# Patient Record
Sex: Male | Born: 1935 | Race: White | Hispanic: No | Marital: Married | Smoking: Former smoker
Health system: Southern US, Community
[De-identification: ages and names within clinical notes are randomized; demographics above are authoritative.]

## PROBLEM LIST (undated history)

## (undated) DIAGNOSIS — E079 Disorder of thyroid, unspecified: Secondary | ICD-10-CM

## (undated) DIAGNOSIS — M519 Unspecified thoracic, thoracolumbar and lumbosacral intervertebral disc disorder: Secondary | ICD-10-CM

## (undated) DIAGNOSIS — E291 Testicular hypofunction: Secondary | ICD-10-CM

## (undated) DIAGNOSIS — Z806 Family history of leukemia: Secondary | ICD-10-CM

## (undated) DIAGNOSIS — Z87442 Personal history of urinary calculi: Secondary | ICD-10-CM

## (undated) DIAGNOSIS — I480 Paroxysmal atrial fibrillation: Secondary | ICD-10-CM

## (undated) DIAGNOSIS — I639 Cerebral infarction, unspecified: Secondary | ICD-10-CM

## (undated) DIAGNOSIS — G4730 Sleep apnea, unspecified: Secondary | ICD-10-CM

## (undated) DIAGNOSIS — I4891 Unspecified atrial fibrillation: Secondary | ICD-10-CM

## (undated) DIAGNOSIS — Z86018 Personal history of other benign neoplasm: Secondary | ICD-10-CM

## (undated) DIAGNOSIS — D509 Iron deficiency anemia, unspecified: Secondary | ICD-10-CM

## (undated) DIAGNOSIS — R001 Bradycardia, unspecified: Secondary | ICD-10-CM

## (undated) DIAGNOSIS — E785 Hyperlipidemia, unspecified: Secondary | ICD-10-CM

## (undated) DIAGNOSIS — K5909 Other constipation: Secondary | ICD-10-CM

## (undated) DIAGNOSIS — E039 Hypothyroidism, unspecified: Secondary | ICD-10-CM

## (undated) DIAGNOSIS — Z801 Family history of malignant neoplasm of trachea, bronchus and lung: Secondary | ICD-10-CM

## (undated) DIAGNOSIS — M5030 Other cervical disc degeneration, unspecified cervical region: Secondary | ICD-10-CM

## (undated) DIAGNOSIS — R972 Elevated prostate specific antigen [PSA]: Secondary | ICD-10-CM

## (undated) DIAGNOSIS — I10 Essential (primary) hypertension: Secondary | ICD-10-CM

## (undated) DIAGNOSIS — K5790 Diverticulosis of intestine, part unspecified, without perforation or abscess without bleeding: Secondary | ICD-10-CM

## (undated) DIAGNOSIS — E230 Hypopituitarism: Secondary | ICD-10-CM

## (undated) DIAGNOSIS — I700 Atherosclerosis of aorta: Secondary | ICD-10-CM

## (undated) DIAGNOSIS — N200 Calculus of kidney: Secondary | ICD-10-CM

## (undated) DIAGNOSIS — E119 Type 2 diabetes mellitus without complications: Secondary | ICD-10-CM

## (undated) DIAGNOSIS — C61 Malignant neoplasm of prostate: Secondary | ICD-10-CM

## (undated) HISTORY — DX: Sleep apnea, unspecified: G47.30

## (undated) HISTORY — DX: Elevated prostate specific antigen (PSA): R97.20

## (undated) HISTORY — DX: Family history of malignant neoplasm of trachea, bronchus and lung: Z80.1

## (undated) HISTORY — DX: Essential (primary) hypertension: I10

## (undated) HISTORY — PX: HERNIA REPAIR: SHX51

## (undated) HISTORY — DX: Family history of leukemia: Z80.6

## (undated) HISTORY — PX: TONSILLECTOMY: SUR1361

## (undated) HISTORY — DX: Paroxysmal atrial fibrillation: I48.0

## (undated) HISTORY — DX: Hyperlipidemia, unspecified: E78.5

## (undated) HISTORY — DX: Cerebral infarction, unspecified: I63.9

## (undated) HISTORY — DX: Disorder of thyroid, unspecified: E07.9

## (undated) HISTORY — PX: COLONOSCOPY: SHX174

## (undated) HISTORY — DX: Other constipation: K59.09

## (undated) HISTORY — DX: Bradycardia, unspecified: R00.1

## (undated) HISTORY — PX: JOINT REPLACEMENT: SHX530

## (undated) HISTORY — PX: SPINE SURGERY: SHX786

---

## 1992-12-11 HISTORY — PX: OTHER SURGICAL HISTORY: SHX169

## 1995-12-12 HISTORY — PX: BACK SURGERY: SHX140

## 1996-12-11 HISTORY — PX: BACK SURGERY: SHX140

## 2005-12-11 HISTORY — PX: KNEE SURGERY: SHX244

## 2006-12-11 HISTORY — PX: NECK SURGERY: SHX720

## 2007-12-12 HISTORY — PX: BACK SURGERY: SHX140

## 2008-12-11 HISTORY — PX: KIDNEY STONE SURGERY: SHX686

## 2010-11-24 DIAGNOSIS — J309 Allergic rhinitis, unspecified: Secondary | ICD-10-CM | POA: Insufficient documentation

## 2010-11-24 DIAGNOSIS — E291 Testicular hypofunction: Secondary | ICD-10-CM | POA: Insufficient documentation

## 2010-11-24 DIAGNOSIS — E1169 Type 2 diabetes mellitus with other specified complication: Secondary | ICD-10-CM | POA: Insufficient documentation

## 2010-11-24 DIAGNOSIS — N200 Calculus of kidney: Secondary | ICD-10-CM | POA: Insufficient documentation

## 2010-11-24 DIAGNOSIS — E039 Hypothyroidism, unspecified: Secondary | ICD-10-CM | POA: Insufficient documentation

## 2010-11-24 DIAGNOSIS — R972 Elevated prostate specific antigen [PSA]: Secondary | ICD-10-CM | POA: Insufficient documentation

## 2010-11-24 DIAGNOSIS — M159 Polyosteoarthritis, unspecified: Secondary | ICD-10-CM | POA: Insufficient documentation

## 2010-11-24 DIAGNOSIS — H9190 Unspecified hearing loss, unspecified ear: Secondary | ICD-10-CM | POA: Insufficient documentation

## 2010-11-24 DIAGNOSIS — L918 Other hypertrophic disorders of the skin: Secondary | ICD-10-CM | POA: Insufficient documentation

## 2012-12-11 DIAGNOSIS — C4492 Squamous cell carcinoma of skin, unspecified: Secondary | ICD-10-CM

## 2012-12-11 HISTORY — DX: Squamous cell carcinoma of skin, unspecified: C44.92

## 2012-12-11 HISTORY — PX: MOHS SURGERY: SUR867

## 2012-12-23 DIAGNOSIS — M5136 Other intervertebral disc degeneration, lumbar region: Secondary | ICD-10-CM | POA: Insufficient documentation

## 2012-12-23 DIAGNOSIS — M51369 Other intervertebral disc degeneration, lumbar region without mention of lumbar back pain or lower extremity pain: Secondary | ICD-10-CM | POA: Insufficient documentation

## 2013-04-07 DIAGNOSIS — N400 Enlarged prostate without lower urinary tract symptoms: Secondary | ICD-10-CM | POA: Insufficient documentation

## 2014-12-11 HISTORY — PX: SPINE SURGERY: SHX786

## 2015-01-28 DIAGNOSIS — M48061 Spinal stenosis, lumbar region without neurogenic claudication: Secondary | ICD-10-CM | POA: Insufficient documentation

## 2016-04-03 DIAGNOSIS — E669 Obesity, unspecified: Secondary | ICD-10-CM | POA: Insufficient documentation

## 2016-04-03 DIAGNOSIS — H9313 Tinnitus, bilateral: Secondary | ICD-10-CM | POA: Insufficient documentation

## 2016-10-16 DIAGNOSIS — G589 Mononeuropathy, unspecified: Secondary | ICD-10-CM | POA: Insufficient documentation

## 2016-11-06 DIAGNOSIS — Z0000 Encounter for general adult medical examination without abnormal findings: Secondary | ICD-10-CM | POA: Insufficient documentation

## 2017-05-09 DIAGNOSIS — R0683 Snoring: Secondary | ICD-10-CM | POA: Insufficient documentation

## 2017-10-05 DIAGNOSIS — E2740 Unspecified adrenocortical insufficiency: Secondary | ICD-10-CM | POA: Insufficient documentation

## 2017-11-08 DIAGNOSIS — E119 Type 2 diabetes mellitus without complications: Secondary | ICD-10-CM | POA: Insufficient documentation

## 2018-01-16 DIAGNOSIS — I480 Paroxysmal atrial fibrillation: Secondary | ICD-10-CM | POA: Insufficient documentation

## 2018-05-15 DIAGNOSIS — G4733 Obstructive sleep apnea (adult) (pediatric): Secondary | ICD-10-CM | POA: Insufficient documentation

## 2018-10-04 DIAGNOSIS — K5909 Other constipation: Secondary | ICD-10-CM | POA: Insufficient documentation

## 2018-10-04 DIAGNOSIS — K5730 Diverticulosis of large intestine without perforation or abscess without bleeding: Secondary | ICD-10-CM | POA: Insufficient documentation

## 2018-10-04 DIAGNOSIS — K635 Polyp of colon: Secondary | ICD-10-CM | POA: Insufficient documentation

## 2018-10-11 DIAGNOSIS — R319 Hematuria, unspecified: Secondary | ICD-10-CM | POA: Insufficient documentation

## 2018-10-11 DIAGNOSIS — R001 Bradycardia, unspecified: Secondary | ICD-10-CM | POA: Insufficient documentation

## 2018-10-11 DIAGNOSIS — E079 Disorder of thyroid, unspecified: Secondary | ICD-10-CM | POA: Insufficient documentation

## 2018-10-11 DIAGNOSIS — Z789 Other specified health status: Secondary | ICD-10-CM | POA: Insufficient documentation

## 2019-01-15 DIAGNOSIS — Z86018 Personal history of other benign neoplasm: Secondary | ICD-10-CM | POA: Insufficient documentation

## 2019-01-15 DIAGNOSIS — E230 Hypopituitarism: Secondary | ICD-10-CM | POA: Insufficient documentation

## 2019-01-15 DIAGNOSIS — I10 Essential (primary) hypertension: Secondary | ICD-10-CM | POA: Insufficient documentation

## 2019-04-12 DIAGNOSIS — I2510 Atherosclerotic heart disease of native coronary artery without angina pectoris: Secondary | ICD-10-CM | POA: Insufficient documentation

## 2019-04-12 DIAGNOSIS — I2584 Coronary atherosclerosis due to calcified coronary lesion: Secondary | ICD-10-CM | POA: Insufficient documentation

## 2019-05-31 DIAGNOSIS — E785 Hyperlipidemia, unspecified: Secondary | ICD-10-CM | POA: Insufficient documentation

## 2019-06-18 DIAGNOSIS — I6381 Other cerebral infarction due to occlusion or stenosis of small artery: Secondary | ICD-10-CM | POA: Insufficient documentation

## 2019-06-22 DIAGNOSIS — Z7901 Long term (current) use of anticoagulants: Secondary | ICD-10-CM | POA: Insufficient documentation

## 2019-07-01 DIAGNOSIS — R471 Dysarthria and anarthria: Secondary | ICD-10-CM | POA: Insufficient documentation

## 2019-07-01 DIAGNOSIS — Z8673 Personal history of transient ischemic attack (TIA), and cerebral infarction without residual deficits: Secondary | ICD-10-CM | POA: Insufficient documentation

## 2019-11-04 DIAGNOSIS — Z0000 Encounter for general adult medical examination without abnormal findings: Secondary | ICD-10-CM | POA: Insufficient documentation

## 2019-11-04 DIAGNOSIS — D369 Benign neoplasm, unspecified site: Secondary | ICD-10-CM | POA: Insufficient documentation

## 2019-11-10 ENCOUNTER — Other Ambulatory Visit: Payer: Self-pay | Admitting: Orthopedic Surgery

## 2019-11-10 DIAGNOSIS — R972 Elevated prostate specific antigen [PSA]: Secondary | ICD-10-CM

## 2019-11-17 ENCOUNTER — Other Ambulatory Visit: Payer: Self-pay

## 2019-11-17 ENCOUNTER — Ambulatory Visit: Payer: Medicare Other | Attending: Neurology | Admitting: Speech Pathology

## 2019-11-17 ENCOUNTER — Encounter: Payer: Self-pay | Admitting: Speech Pathology

## 2019-11-17 DIAGNOSIS — R471 Dysarthria and anarthria: Secondary | ICD-10-CM

## 2019-11-17 NOTE — Therapy (Signed)
[place] MAIN [facility] SERVICES [address] [place], [place], [address] Phone: [phone]   Fax:  [phone]  Speech Language Pathology Evaluation  Patient Details  Name: Andrew Callahan MRN: [id number] Date of Birth: [birth year] No data recorded  Encounter Date: 11/17/2019  End of Session - 11/17/19 1308    Visit Number  1    Number of Visits  17    Date for SLP Re-Evaluation  01/17/19    SLP Start Time  1108    SLP Stop Time   1200    SLP Time Calculation (min)  52 min    Activity Tolerance  Patient tolerated treatment well       No past medical history on file.    There were no vitals filed for this visit.  Subjective Assessment - 11/17/19 1128    Subjective  Pt was very pleasant, a good historian and attempted all evaluation tasks to the best of his ability.    Patient is accompained by:  Family member   wife   Currently in Pain?  No/denies         SLP Evaluation [facility] - 11/17/19 1128      SLP Visit Information   SLP Received On  11/17/19    Onset Date  05/2019      Subjective   Subjective  Pt reported he is doing well today. Wife present and very supportive, brought previous speech tx materials.    Patient/Family Stated Goal  I would like to speak more clearly. Sometimes my wife has trouble understanding me, especially if I try to speak quickly.      General Information   HPI  This very pleasant 85 y/o pt recently moved to [place] from [place], [place] and is referred by Dr. Melrose Nakayama for evaluation of his speech s/p CVA 05/2019. Pt reports he suffered a stroke on 05/31/19 in [place], [place] which resulted in dysarthria and R facial droop. He participated in 2 week inpatient rehab program with PT/OT/ST, and received approximately 7-8 outpatient ST treatments for dysarthria. Pt reports he has to speak slowly to speak with clarity;however "people don't like to wait" and often don't understand me or repeatedly ask me questions without  giving me time to answer. Wife also reports difficulty understanding pt especially in the morning if he tries to speak quickly. Pt is here seeking assistance with the clarity of his speech.   Behavioral/Cognition  WFL    Mobility Status  Difficulty with balance      Balance Screen   Has the patient fallen in the past 6 months  No    Has the patient had a decrease in activity level because of a fear of falling?   Yes    Is the patient reluctant to leave their home because of a fear of falling?   No      Prior Functional Status   Type of Home  House     Lives With  Spouse    Available Support  --   none, recently moved from [place], [place]   Education  PhD    Vocation  Retired   Armed forces operational officer   Overall Cognitive Status  Within [employer] for tasks assessed      Auditory Comprehension   Overall [place] within functional limits for tasks assessed      Verbal Expression   Overall Verbal Expression  Appears within functional limits for tasks assessed  Oral Motor/Sensory Function   Overall Oral Motor/Sensory Function  Impaired    Labial ROM  Within Functional Limits    Labial Symmetry  Abnormal symmetry right    Labial Strength  Reduced Right    Labial Coordination  Reduced    Lingual ROM  Within Functional Limits    Lingual Symmetry  Abnormal symmetry right;Other (Comment)   very mild   Lingual Strength  Within Functional Limits    Lingual Coordination  Reduced    Facial ROM  Within Functional Limits    Facial Symmetry  Right droop    Facial Strength  Reduced    Facial Coordination  WFL    Velum  Impaired right   very mild   Mandible  Within Functional Limits    Overall Oral Motor/Sensory Function  Mild to mod deficits      Motor Speech   Overall Motor Speech  Impaired    Respiration  Impaired    Level of Impairment  Sentence    Phonation  Normal    Resonance  Hyponasality   mild   Articulation  Impaired    Level of  Impairment  Word    Intelligibility  Intelligibility reduced    Word  75-100% accurate    Phrase  75-100% accurate    Sentence  75-100% accurate    Conversation  75-100% accurate    Motor Planning  [place] functional limits    Effective Techniques  Slow rate;Increased vocal intensity;Over-articulate;Pacing    Phonation  WFL       Additional Objective Measures:  Maximum phonation time: average of 13.8 seconds (Mean norm for ages 81-90=22.31 seconds)  Coordination: Diadochokinetic Rates/Sequential Motion Rates:  /puh/: 2.2 per second: Normative values for males ages 74-86=6.7/second /tuh/:1.8 per second: Normative values for males ages 74-86=6.4/second /kuh/:1.2 per second: Normative values for males ages 74-86=5.8/second  /puhtuhkuh/: .8 per second: No normative values for males ages 52-86 available; however normative values for males ages 65-74=6.1/second  S/z ratio: 1.68       SLP Education - 11/17/19 1307    Education Details  [id number] of SLP in dysarthria tx, compensatory strategies to improve intelligibility of speech    Person(s) Educated  Patient;Spouse    Methods  Explanation;Demonstration;Verbal cues    Comprehension  Verbalized understanding;Need further instruction;Returned demonstration;Verbal cues required         SLP Long Term Goals - 11/17/19 1310      SLP LONG TERM GOAL #1   Title  Pt will read multisyllabic words with 90% accuracy given min verbal and visual cues.    Time  8    Period  Weeks    Status  New    Target Date  01/17/19      SLP LONG TERM GOAL #2   Title  Patient will use appropriate articulatory accuracy, speech rate, and breath support when reading paragraphs as well as during a 5 minute conversational sample with 90% accuracy given min verbal and visual cues.    Time  8    Period  Weeks    Status  New    Target Date  01/17/19      SLP LONG TERM GOAL #3   Title  Pt will use compensatory strategies of overarticulation and pacing to  improve the precision and clarity of his speech with 90% accuracy independently.    Time  8    Period  Weeks    Status  New    Target Date  01/17/19  Plan - 11/17/19 1309    Clinical Impression Statement  This very pleasant 85 y/o male presents with mod dysarthria c/b decreased coordination articulating multi syllabic words particularly those containing sibilants, affricates, and blends as well as decreased breath support for speech at the sentence level. Oral motor exam revealed mild to mod decreased facial and labial strength and decreased lingual coordination. Noted 80-90% intelligibility at the conversational level when pt speaks slowly; however intelligibility decreases as pt fatigues. Pt will benefit from skilled ST services to improve articulatory precision & coordination and breath support to improve and restore pt's functional independence.    Speech Therapy Frequency  2x / week    Duration  Other (comment)   8 weeks   Treatment/Interventions  SLP instruction and feedback;Functional tasks;Compensatory strategies;Patient/family education;Cueing hierarchy    Potential to Achieve Goals  Good    Potential Considerations  Ability to learn/carryover information;Family/community support;Previous level of function;Severity of impairments;Cooperation/participation level    SLP Home Exercise Plan  TBD    Consulted and Agree with Plan of Care  Patient;Family member/caregiver    Family Member Consulted  wife       Patient will benefit from skilled therapeutic intervention in order to improve the following deficits and impairments:   Dysarthria and anarthria    Problem List There are no active problems to display for this patient.   [place], MA, CCC-SLP 11/17/2019, 4:35 PM  [place] MAIN [facility] SERVICES [address] [place], [place], [address] Phone: [phone]   Fax:  [phone]  Name: Andrew Callahan MRN: [id number] Date of  Birth: August 23, 1934

## 2019-11-20 ENCOUNTER — Ambulatory Visit
Admission: RE | Admit: 2019-11-20 | Discharge: 2019-11-20 | Disposition: A | Discharge status: Home / Self Care | Payer: Medicare Other | Source: Ambulatory Visit | Attending: Orthopedic Surgery | Admitting: Orthopedic Surgery

## 2019-11-20 ENCOUNTER — Other Ambulatory Visit: Payer: Self-pay

## 2019-11-20 ENCOUNTER — Ambulatory Visit: Payer: Self-pay

## 2019-11-20 DIAGNOSIS — R972 Elevated prostate specific antigen [PSA]: Secondary | ICD-10-CM | POA: Diagnosis present

## 2019-11-20 LAB — POCT I-STAT CREATININE: Creatinine, Ser: 0.8 mg/dL (ref 0.61–1.24)

## 2019-11-20 MED ORDER — GADOBUTROL 1 MMOL/ML IV SOLN
10.0000 mL | Freq: Once | INTRAVENOUS | Status: AC | PRN
  Administered 2019-11-20: 10 mL via INTRAVENOUS

## 2019-11-24 ENCOUNTER — Other Ambulatory Visit: Payer: Self-pay

## 2019-11-24 ENCOUNTER — Encounter: Payer: Self-pay | Admitting: Speech Pathology

## 2019-11-24 ENCOUNTER — Ambulatory Visit: Payer: Medicare Other | Admitting: Speech Pathology

## 2019-11-24 DIAGNOSIS — R471 Dysarthria and anarthria: Secondary | ICD-10-CM

## 2019-11-24 NOTE — Therapy (Signed)
[place] MAIN [facility] SERVICES [address] [place], [place], [address] Phone: [phone]   Fax:  [phone]  Speech Language Pathology Treatment  Patient Details  Name: Andrew Callahan MRN: [id number] Date of Birth: [birth year] No data recorded  Encounter Date: 11/24/2019  End of Session - 11/24/19 1224    Visit Number  2    Number of Visits  17    Date for SLP Re-Evaluation  01/17/19    Authorization - Visit Number  1    Authorization - Number of Visits  10    SLP Start Time  1100    SLP Stop Time   1200    SLP Time Calculation (min)  60 min    Activity Tolerance  Patient tolerated treatment well       Past Medical History:  Diagnosis Date  . Stroke [facility])     Past Surgical History:  Procedure Laterality Date  . SPINE SURGERY      There were no vitals filed for this visit.  Subjective Assessment - 11/24/19 1222    Subjective  Pt was very pleasant and appeared to enjoy treatment attempting all tasks to the best of his ability.    Patient is accompained by:  Family member   wife   Currently in Pain?  No/denies            ADULT SLP TREATMENT - 11/24/19 0001      General Information   Behavior/Cognition  Alert;Cooperative;Pleasant mood    HPI   This very pleasant 85 y/o pt recently moved to [place] from [place], [place] and is referred by Dr. Melrose Nakayama for evaluation of his speech s/p CVA 05/2019. Pt reports he suffered a stroke on 05/31/19 in [place], [place] which resulted in dysarthria and R facial droop. He participated in 2 week inpatient rehab program with PT/OT/ST, and received approximately 7-8 outpatient ST treatments for dysarthria. Pt reports he has to speak slowly to speak with clarity;however "people don't like to wait" and often don't understand me or repeatedly ask me questions without giving me time to answer. Wife also reports difficulty understanding pt especially in the morning if he tries to speak quickly. Pt is  here seeking assistance with the clarity of his speech.      Treatment Provided   Treatment provided  Cognitive-Linquistic      Pain Assessment   Pain Assessment  No/denies pain      Cognitive-Linquistic Treatment   Treatment focused on  Dysarthria;Patient/family/caregiver education    Skilled Treatment   Pt read multisyllabic words with overarticulation and pacing with 90% accuracy given intermittent verbal cues. Pt used appropriate articulatory accuracy, speech rate and breath support when answering wh- questions with 90% accuracy given intermittent verbal cues for overarticulation.        Assessment / Recommendations / Plan   Plan  Continue with current plan of care      Progression Toward Goals   Progression toward goals  Progressing toward goals       SLP Education - 11/24/19 1224    Education Details  re: compensatory strategies to improve intelligibility of speech    Person(s) Educated  Patient    Methods  Explanation;Demonstration;Verbal cues    Comprehension  Verbalized understanding;Need further instruction;Returned demonstration;Verbal cues required         SLP Long Term Goals - 11/17/19 1310      SLP LONG TERM GOAL #1   Title  Pt will read multisyllabic words  with 90% accuracy given min verbal and visual cues.    Time  8    Period  Weeks    Status  New    Target Date  01/17/19      SLP LONG TERM GOAL #2   Title  Patient will use appropriate articulatory accuracy, speech rate, and breath support when reading paragraphs as well as during a 5 minute conversational sample with 90% accuracy given min verbal and visual cues.    Time  8    Period  Weeks    Status  New    Target Date  01/17/19      SLP LONG TERM GOAL #3   Title  Pt will use compensatory strategies of overarticulation and pacing to improve the precision and clarity of his speech with 90% accuracy independently.    Time  8    Period  Weeks    Status  New    Target Date  01/17/19       Plan  - 11/24/19 1225    Clinical Impression Statement  Pt used pacing independently to improve intelligibility of conversational speech. Verbal cues for overarticulation were successful in improving articulatory precision in both isolated treatment targets as well as in conversation. Pt will benefit from continued skilled ST treatment to improve intelligibility of speech and functional independence.   Speech Therapy Frequency  2x / week    Duration  Other (comment)   8 weeks   Treatment/Interventions  SLP instruction and feedback;Functional tasks;Compensatory strategies;Patient/family education;Cueing hierarchy    Potential to Achieve Goals  Good    Potential Considerations  Ability to learn/carryover information;Family/community support;Previous level of function;Severity of impairments;Cooperation/participation level    SLP Home Exercise Plan  use of compensatory strategies at home; overarticulation w/blends, multisyllabic words    Consulted and Agree with Plan of Care  Patient       Patient will benefit from skilled therapeutic intervention in order to improve the following deficits and impairments:   Dysarthria and anarthria    Problem List There are no problems to display for this patient.   [place], MA, CCC-SLP 11/24/2019, 3:45 PM  [place] MAIN [facility] SERVICES [address] [place], [place], [address] Phone: [phone]   Fax:  [phone]   Name: Andrew Callahan MRN: [id number] Date of Birth: November 27, 1934

## 2019-11-27 ENCOUNTER — Ambulatory Visit: Payer: Medicare Other | Admitting: Speech Pathology

## 2019-11-27 ENCOUNTER — Other Ambulatory Visit: Payer: Self-pay

## 2019-11-27 ENCOUNTER — Encounter: Payer: Self-pay | Admitting: Speech Pathology

## 2019-11-27 DIAGNOSIS — R471 Dysarthria and anarthria: Secondary | ICD-10-CM | POA: Diagnosis not present

## 2019-11-27 NOTE — Therapy (Signed)
[place] MAIN [facility] SERVICES [address] [place], [place], [address] Phone: [phone]   Fax:  [phone]  Speech Language Pathology Treatment  Patient Details  Name: Andrew Callahan MRN: [id number] Date of Birth: 04-29-1934 No data recorded  Encounter Date: 11/27/2019  End of Session - 11/27/19 1245    Visit Number  3    Number of Visits  17    Date for SLP Re-Evaluation  01/17/19    Authorization - Visit Number  2    Authorization - Number of Visits  10    SLP Start Time  1100    SLP Stop Time   1202    SLP Time Calculation (min)  62 min    Activity Tolerance  Patient tolerated treatment well       Past Medical History:  Diagnosis Date  . Stroke [facility])     Past Surgical History:  Procedure Laterality Date  . SPINE SURGERY      There were no vitals filed for this visit.  Subjective Assessment - 11/27/19 1113    Subjective  Pt was very pleasant and cooperative, atempting all tx tasks to the best of his ability.    Patient is accompained by:  Family member   wife   Currently in Pain?  No/denies            ADULT SLP TREATMENT - 11/27/19 0001      General Information   Behavior/Cognition  Alert;Cooperative;Pleasant mood    HPI   This very pleasant 85 y/o pt recently moved to [place] from [place], [place] and is referred by Dr. Melrose Nakayama for evaluation of his speech s/p CVA 05/2019. Pt reports he suffered a stroke on 05/31/19 in [place], [place] which resulted in dysarthria and R facial droop. He participated in 2 week inpatient rehab program with PT/OT/ST, and received approximately 7-8 outpatient ST treatments for dysarthria. Pt reports he has to speak slowly to speak with clarity;however "people don't like to wait" and often don't understand me or repeatedly ask me questions without giving me time to answer. Wife also reports difficulty understanding pt especially in the morning if he tries to speak quickly. Pt is here seeking  assistance with the clarity of his speech.      Treatment Provided   Treatment provided  Cognitive-Linquistic      Pain Assessment   Pain Assessment  No/denies pain      Cognitive-Linquistic Treatment   Treatment focused on  Dysarthria;Patient/family/caregiver education    Skilled Treatment   Pt produced multisyllabic words with overarticulation  with 90% accuracy given intermittent verbal cues.   Pt used appropriate articulatory accuracy, speech rate and breath support when answering wh- questions with 90% accuracy given intermittent verbal cues for overarticulation.       Assessment / Recommendations / Plan   Plan  Continue with current plan of care      Progression Toward Goals   Progression toward goals  Progressing toward goals       SLP Education - 11/27/19 1244    Education Details  re: compensatory strategies to improve intelligibility of speech, home practice strategies    Person(s) Educated  Patient;Spouse    Methods  Explanation;Demonstration;Handout    Comprehension  Verbalized understanding         SLP Long Term Goals - 11/27/19 1246      SLP LONG TERM GOAL #1   Title  Pt will read multisyllabic words with 90% accuracy given min  verbal and visual cues.    Time  8    Period  Weeks    Status  Achieved      SLP LONG TERM GOAL #2   Title  Patient will use appropriate articulatory accuracy, speech rate, and breath support when reading paragraphs as well as during a 5 minute conversational sample with 90% accuracy given min verbal and visual cues.    Time  8    Period  Weeks    Status  Achieved      SLP LONG TERM GOAL #3   Title  Pt will use compensatory strategies of overarticulation and pacing to improve the precision and clarity of his speech with 90% accuracy independently.    Time  8    Period  Weeks    Status  Achieved       Plan - 11/27/19 1245    Clinical Impression Statement  Pt demonstrated consistent independence using compensatory  strategies of pacing and overarticulation to improve clarity of speech throughout treatment. Pt demonstrates use of self monitoring and self correction to improve clarity of speech. Spontaneous speech in conversation is 95% intelligible. Provided home practice materials to continue to work on muscular endurance required for articulatory precision with prolonged speaking. Will d/c pt from ST tx today with home maintenance/improvement plan, pt has met all treatment goals   Speech Therapy Frequency  2x / week    Duration  Other (comment)   d/c today   Treatment/Interventions  SLP instruction and feedback;Functional tasks;Compensatory strategies;Patient/family education;Cueing hierarchy    Potential to Achieve Goals  Good    Potential Considerations  Ability to learn/carryover information;Family/community support;Previous level of function;Severity of impairments;Cooperation/participation level    Consulted and Agree with Plan of Care  Patient;Family member/caregiver    Family Member Consulted  wife       Patient will benefit from skilled therapeutic intervention in order to improve the following deficits and impairments:   Dysarthria and anarthria    Problem List There are no problems to display for this patient.   [place], MA, CCC-SLP 11/27/2019, 12:53 PM  [place] MAIN [facility] SERVICES [address] [place], [place], [address] Phone: [phone]   Fax:  [phone]   Name: Andrew Callahan MRN: [id number] Date of Birth: 16-Jan-1934

## 2019-12-01 ENCOUNTER — Ambulatory Visit: Payer: Medicare Other | Admitting: Speech Pathology

## 2019-12-08 ENCOUNTER — Encounter: Payer: Federal, State, Local not specified - PPO | Admitting: Speech Pathology

## 2019-12-09 NOTE — H&P (Signed)
Andrew Callahan, VEST MEDICAL RECORD [id number] ACCOUNT [id number] DATE OF BIRTH:Mar 29, 1934 FACILITY: [facility] LOCATION:  PHYSICIAN:Duran Farrel Conners, MD  HISTORY AND PHYSICAL  DATE OF ADMISSION:  01/01/2020  CHIEF COMPLAINT:  Elevated PSA.  HISTORY OF PRESENT ILLNESS:  The patient is an 85 year old white male found to have an elevated PSA of 8.64 ng/mL.  He also has significant lower urinary tract symptoms.  Evaluation in the office included a prostate MRI scan which indicated a 69.7 mL  prostate with a 3.1 cm PI-RADS category 5 lesion involving the left side of his prostate.  Further evaluation included a Uroflow study you, which indicated a maximum flow rate of 7 mL per second, average flow rate of 4.4 mL per second and a postvoid  residual of 90 mL based upon a voided volume of 133 mL.  Cystoscopy revealed lateral lobe hypertrophy with a 4 cm prostatic urethral length and no evidence of median lobe.  The patient comes in now for UroNav fusion biopsy of the prostate.  ALLERGIES:  Included metoprolol.  CURRENT MEDICATIONS:  Hydrocortisone, Eliquis, Crestor, levothyroxine, valsartan, gabapentin, gabapentin, AndroGel.  PAST SURGICAL HISTORY:  Lumbar laminectomy in 1997 and 1998.  Pituitary adenoma removal in 1994.  Left knee arthroscopy 2007.  A cervical spine surgery 2008, lumbar spine surgery 2009, ureteroscopic ureterolithotomy in 2010.  Upper eyelid repair 2010,  left partial knee replacement in 2011, removal of skin cancer from his hand 2014 and lumbar spine fusion in 2016.  PAST 1.  Atrial fibrillation. 2.  Hypertension. 3.  Hypercholesterolemia. 4.  Panhypopituitarism.  5.  Hypothyroidism. 6.  History of stroke June 2020 which left him with dysarthria and difficulty with handwriting and balance issues. 7.  Sleep apnea.  REVIEW OF SYSTEMS:  The patient has chronic joint discomfort.  He has constipation.  He has decreased auditory acuity and insomnia.  He denies chest pain,  shortness of breath, myocardial infarction or diabetes.  SOCIAL HISTORY:  The patient quit smoking several years ago with a 20-pack-year history.  He consumes 3 alcoholic beverages per week.  PHYSICAL EXAMINATION: VITAL SIGNS:  Height 5 feet 9 inches, weight 234 pounds. GENERAL:  A well-nourished, white male in no acute distress. HEENT:  Sclerae were clear.  Pupils are equally round, reactive to light and accommodation.  Extraocular movements are intact. NECK:  Supple, no palpable cervical adenopathy. PULMONARY:  Lungs clear to auscultation. CARDIOVASCULAR:  Regular rhythm and rate without audible murmurs. ABDOMEN:  Soft, nontender abdomen. GENITOURINARY:  Circumcised.  Testes were smooth, nontender 16 mL size each. RECTAL:  A 40 g, smooth, nontender prostate. NEUROMUSCULAR:  Alert and oriented x3.  IMPRESSION:   1.  Elevated PSA with a category 5 PI-RAD lesion of the left side of the prostate. 2.  Benign prostatic hypertrophy with bladder outlet obstruction.  PLAN:  Image-guided fusion biopsy with the UroNav system.  PN/NUANCE  D:12/09/2019 T:12/09/2019 JOB:[id number]

## 2019-12-11 ENCOUNTER — Encounter: Payer: Federal, State, Local not specified - PPO | Admitting: Speech Pathology

## 2019-12-15 ENCOUNTER — Encounter: Payer: Medicare Other | Admitting: Speech Pathology

## 2019-12-18 ENCOUNTER — Ambulatory Visit: Payer: Federal, State, Local not specified - PPO

## 2019-12-18 ENCOUNTER — Ambulatory Visit: Payer: Federal, State, Local not specified - PPO | Admitting: Speech Pathology

## 2019-12-22 ENCOUNTER — Encounter: Payer: Federal, State, Local not specified - PPO | Admitting: Speech Pathology

## 2019-12-24 ENCOUNTER — Other Ambulatory Visit: Payer: Self-pay

## 2019-12-24 ENCOUNTER — Encounter: Payer: Self-pay | Admitting: Physical Therapy

## 2019-12-24 ENCOUNTER — Ambulatory Visit: Payer: Medicare Other | Attending: Neurology | Admitting: Physical Therapy

## 2019-12-24 DIAGNOSIS — R278 Other lack of coordination: Secondary | ICD-10-CM | POA: Diagnosis present

## 2019-12-24 DIAGNOSIS — M545 Low back pain: Secondary | ICD-10-CM | POA: Insufficient documentation

## 2019-12-24 DIAGNOSIS — G8929 Other chronic pain: Secondary | ICD-10-CM | POA: Insufficient documentation

## 2019-12-24 DIAGNOSIS — M533 Sacrococcygeal disorders, not elsewhere classified: Secondary | ICD-10-CM | POA: Diagnosis present

## 2019-12-24 DIAGNOSIS — R29898 Other symptoms and signs involving the musculoskeletal system: Secondary | ICD-10-CM | POA: Diagnosis present

## 2019-12-24 DIAGNOSIS — M6208 Separation of muscle (nontraumatic), other site: Secondary | ICD-10-CM | POA: Insufficient documentation

## 2019-12-24 NOTE — Patient Instructions (Signed)
toileting mechanics for less straining      Increasing water from 1 glass to 4 glasses per day     Buy portable cycler and squatty potty Or foot stool while waiting for order    When putting on socks and shoes, turn body at 45 deg and prop on chair/ stool   Still use the kneeling technique technique if need

## 2019-12-25 ENCOUNTER — Encounter: Payer: Federal, State, Local not specified - PPO | Admitting: Speech Pathology

## 2019-12-25 NOTE — Therapy (Signed)
[place] MAIN [facility] SERVICES [address] [place], [place], [address] Phone: [phone]   Fax:  [phone]  Physical Therapy Evaluation  Patient Details  Name: Andrew Callahan MRN: [id number] Date of Birth: [birth year] Referring Provider (PT): Tammi Klippel    Encounter Date: 12/24/2019  PT End of Session - 12/25/19 1730    Visit Number  1    Number of Visits  10    Date for PT Re-Evaluation  03/04/20   eval 12/24/19   PT Start Time  1300    PT Stop Time  1430    PT Time Calculation (min)  90 min    Activity Tolerance  Patient tolerated treatment well    Behavior During Therapy  [facility] for tasks assessed/performed       Past Medical History:  Diagnosis Date  . Chronic constipation   . Elevated prostate specific antigen (PSA)   . Hyperlipidemia   . Hypertension   . Hypothyroid   . Paroxysmal A-fib ([place])   . Sinus bradycardia   . Sleep apnea    uses CPAP  . Stroke [facility])     Past Surgical History:  Procedure Laterality Date  . BACK SURGERY  1997   L3, 4, 5 laminectomy   . BACK SURGERY  1998   hard wire removed, fusion of L3-5  . BACK SURGERY  2009   laminotomy/foraminotomy w/ decompression of nerve root, facet thermal ablation L2-3.   [facility] [place]  2010  . KNEE SURGERY Left 2007   Arthroscopic partial knee replacement, replaced medial side   . MOHS SURGERY Right 2014   squamous cell carcinoma  . NECK SURGERY  2008   fusion with hardwire in place C3 through C7   . pituitary adenoma  1994   benign  . SPINE SURGERY    . SPINE SURGERY  2016   fusion L4     There were no vitals filed for this visit.   Subjective Assessment - 12/25/19 1733    Subjective 1) Difficulty with bowel elimination with Hx of constipation:  Somtimes pt had had liquidy paste stools Stool Type 6 ( 10% of the time)  and most of the time the stool is Type 4 but it is hard to sqeeze out and then half of the time, he has gas after the bowel  movement. Constipation started after last back surgery 2016. Pt was taking a lot of pills after the surgery.  Colonoscopy was clear. After the colonoscopy 10/04/2018, pt experienced a normal bowel movements. Frequency of bowel movements 3-4 x day by not consistently at the same time of the time. Daily fluid intake: 1 glass of water, 16 fl oz coffee, no tea, no juices, no alcohol.         2)  Back pain for 20 years, it got really bad with sciatica. Underwent 4 back surgeries and sciatica from L LBP to toe improved. Currently, pt notices B thighs get tired quickly. There is sharp sensations on R midthigh above knee with walking 3/10 intensity.  Tingling sensation that remains when he brushes his hand on it and it disappears after a minute.  1/10 intensity.     3)  Frequent urination/ urge incontinence. Frequency  started more than a year ago. Once every 2, 4 hours. Pt is not able to stop urination when it started. Has leakage before making to the bathroom. Deviated stream to R. Nocturia epsiodes 3-5 x . Uses CPAP for OSA.  Elevated PSA: Pt will be getting a biopsy of prostate next week. Wife said that pt has a bump/ nodule on his prostate.  The previous urologist in [place] did not do anything about it. Current urologist ( Dr. Boneta Lucks) has performed multiple tests.    Pertinent History  Pt and wife moved from [place]. Pt does not have a pulmonologist currently here and would like a referral.   Pt getting a medical work up for a nodule on his prostate next week. Hx of Underwent 4 back surgeries , pituitary adenomonty, A-fib, Acute Ischemic Stroke  p/w dysarthria/ R facial droop ( 05/31/19), MOHS surgery for squamous cell removal on R hand. Enjoyed the gym preCOVID pandemic bike 30 min, dumbbells. no sit up and crunches         [facility] PT Assessment - 12/24/19 1358      Assessment   Medical Diagnosis  constipation. fecal urgency     Referring Provider (PT)  Tammi Klippel       Precautions    Precautions  None      Restrictions   Weight Bearing Restrictions  No      Balance Screen   Has the patient fallen in the past 6 months  No      [place] residence   [place]    Type of Home  House      Prior Function   Level of Independence  Independent      Cognition   Overall Cognitive Status  --   speech, memory, and balance     Sensation   Additional Comments  "sharp/ tingling" along R L3, 5, S1       Coordination   Gross Motor Movements are Fluid and Coordinated  --   chest breathing, limited diaphragmatic excursion      AROM   Overall AROM Comments  hip flexion in supine limited 90 deg on L , ~110 deg on R       Strength   Overall Strength Comments  hip flex/knee flex/ ext 5/5         Palpation   Spinal mobility  L side bend limited > R  . Noted R convex lumbar curve   SI assessment   L iliac crest higher. FADDIR on b ( L > R)        Bed Mobility   Bed Mobility  --   half crunch               Objective measurements completed on examination: See above findings.    Pelvic Floor Special Questions - 12/24/19 1438    Diastasis Recti  abdominal bulging below sternum, 3 fingers with below umbilicus         [facility] Adult PT Treatment/Exercise - 12/25/19 1726      Bed Mobility   Bed Mobility  --   half crunch     Therapeutic Activites    Therapeutic Activities  Other Therapeutic Activities    Other Therapeutic Activities  explained anatomy. physiology and Sx       Neuro Re-ed    Neuro Re-ed Details   cued for log rolling, sit to stand to optimize deep core            PT Long Term Goals - 12/25/19 1715      PT LONG TERM GOAL #1   Title  Pt will improve his  FOTO score bowel from 36 to > 50 pts  and urinary from 45 to > 54 pts in order to demo improved function    Time  10    Status  New    Target Date  03/04/20      PT LONG TERM GOAL #2   Title  Pt will report equal sensation with dermatome along  L3, L5, S1 bilaterally in order to demo improved QOL    Baseline  Currently, pt notices B thighs get tired quickly. There is sharp sensations on R midthigh above knee with walking 3/10 intensity. Tingling sensation that remains when he brushes his hand on it and it disappears after a minute. 1/10 intensity.    Time  6    Period  Weeks    Status  New    Target Date  02/05/20      PT LONG TERM GOAL #3   Title  Pt will demo decreased abdominal bulging, more closure of rectus abdominal mm in order to improve intraabdominal pressure system for postural and motility improvements    Time  4    Status  New    Target Date  01/22/20      PT LONG TERM GOAL #4   Title  Pt will demo proper body mechanics in ADLs and technique with fitness routine to minimize straining on pelvic  floor/ abdominal/ spine and minimize worsening of Sx    Time  8    Period  Weeks    Status  New    Target Date  02/19/20      PT LONG TERM GOAL #5   Title  Pt will report increasing intake of water from 1 glass per day to 4 glasses ( 32 fl oz)  in order to improve bowel movements and bladder health    Time  2    Period  Weeks    Status  New    Target Date  01/08/20      Additional Long Term Goals   Additional Long Term Goals  Yes      PT LONG TERM GOAL #6   Title  Pt will demo proper deep core coordination to no longer strain with bowel movements and compliant with toileting mechanics in order to minimize abdominal/ pelvic floor straining    Time  4    Period  Weeks    Status  New    Target Date  01/22/20                 Plan - 12/25/19 1731    Clinical Impression Statement  Pt is a 86 yo male who complains of difficulty w/eliminating bowels, Hx of constipation that started after most recent back surgery ( 4 out of 4)  in 2016. Pt also reports CLBP with sharp sensations along R anteromedial thigh in addition to frequent urination and urge incontinence. These deficits impact him his QOL and keeps him from  leaving his house. Clinical presentation today include signs of poor intraabdominal pressure system impacting his Sx:  _ Limited L SIJ mobility, limited lumbar scar restrictions,  _abdominal bulging/ rectus abdominal separation below sternum _limited spinal L sideflexion with R convex lumbar curve _poor body mechanics that place strain on abdomen/ pelvic floor/ spine _limited hip flexion AROM on L  _dyscoordination of deep core mm _Hx of 4 lumbar surgeries and neck fusion, L partial knee replacement,  Associated factors to his Sx: see complex comorbidities and limited water intake with poor bladder health education. Pt also enjoyed working out at the  gym prior to pandemic with bike 30 min, dumbbells weights.    Plan to help improve intraabdominal pressure system which can help improve his Sx and help pt set up home gym to maintain the MMT 5/5 BLE strength pt demonstrated. Plan to educate pt on proper technique with lifting dumbbells to minimize straining pelvic floor at next session. Following Tx today, pt voiced compliance with increasing water and demo'd correct  toileting technique to minimize straining with bowel movements.   Interdisciplinary team info:  Also plan to stay updated with his urological medical work-up re: nodule on his prostate. Pt has recently moved from [place] and building up an interdisciplinary team. Pt is looking for a pulmonologist. Plan to refer pt names of pulmonologists as pt has not updated sleep study but has been compliant with CPAP machine for OSA.   Pt presented today with wife who states pt has been having speech, memory, and balance problems. [facility] at [facility], independent living community.       Personal Factors and Comorbidities  Comorbidity 3+;Past/Current Experience;Age    Comorbidities  Pt is getting a medical work up for a nodule on his prostate next week. Hx of Underwent 4 back surgeries,  pituitary adenomonty, A-fib, Acute Ischemic Stroke  p/w  dysarthria/ R facial droop ( 05/31/19), MOHS surgery for squamous cell removal on R hand, L partial knee replacement    Examination-Activity Limitations  Continence;Locomotion Level;Transfers;Lift;Toileting;Bed Mobility    Stability/Clinical Decision Making  Evolving/Moderate complexity    Clinical Decision Making  High    Rehab Potential  Good    PT Frequency  1x / week    PT Duration  --   10   PT Treatment/Interventions  Neuromuscular re-education;Manual techniques;Patient/family education;Therapeutic activities;Functional mobility training;Stair training;Moist Heat;Scar mobilization;Taping;Therapeutic exercise    Consulted and Agree with Plan of Care  Patient       Patient will benefit from skilled therapeutic intervention in order to improve the following deficits and impairments:  Decreased activity tolerance, Decreased endurance, Decreased knowledge of precautions, Difficulty walking, Decreased safety awareness, Increased muscle spasms, Improper body mechanics, Postural dysfunction, Decreased scar mobility, Decreased mobility, Decreased coordination, Hypomobility, Decreased strength, Decreased range of motion  Visit Diagnosis: Diastasis recti - Plan: PT plan of care cert/re-cert  Other lack of coordination - Plan: PT plan of care cert/re-cert  Other symptoms and signs involving the musculoskeletal system - Plan: PT plan of care cert/re-cert  Chronic bilateral low back pain without sciatica - Plan: PT plan of care cert/re-cert  Sacrococcygeal disorders, not elsewhere classified - Plan: PT plan of care cert/re-cert     Problem List There are no problems to display for this patient.   Jerl Mina ,PT, DPT, E-RYT  12/25/2019, 6:06 PM  [place] MAIN [facility] SERVICES [address] [place], [place], [address] Phone: [phone]   Fax:  [phone]  Name: Andrew Callahan MRN: [id number] Date of Birth: 1934-04-03

## 2019-12-29 ENCOUNTER — Encounter: Payer: Federal, State, Local not specified - PPO | Admitting: Speech Pathology

## 2019-12-29 ENCOUNTER — Encounter
Admission: RE | Admit: 2019-12-29 | Discharge: 2019-12-29 | Disposition: A | Discharge status: Home / Self Care | Payer: Medicare Other | Source: Ambulatory Visit | Attending: Urology | Admitting: Urology

## 2019-12-29 ENCOUNTER — Other Ambulatory Visit: Payer: Self-pay

## 2019-12-29 HISTORY — DX: Hypothyroidism, unspecified: E03.9

## 2019-12-29 HISTORY — DX: Personal history of urinary calculi: Z87.442

## 2019-12-29 NOTE — Patient Instructions (Signed)
Your procedure is scheduled on: Thurs 1/21 Report to [facility]. To find out your arrival time please call [phone] between 1PM - 3PM on Wed. 1/20.  Remember: Instructions that are not followed completely may result in serious medical risk,  up to and including death, or upon the discretion of your surgeon and anesthesiologist your  surgery may need to be rescheduled.     _X__ 1. Do not eat food after midnight the night before your procedure.                 No gum chewing or hard candies. You may drink clear liquids up to 2 hours                 before you are scheduled to arrive for your surgery- DO not drink clear                 liquids within 2 hours of the start of your surgery.                 Clear Liquids include:  water, apple juice without pulp, clear carbohydrate                 drink such as Clearfast of Gatorade, Black Coffee or Tea (Do not add                 anything to coffee or tea).  __X__2.  On the morning of surgery brush your teeth with toothpaste and water, you                may rinse your mouth with mouthwash if you wish.  Do not swallow any toothpaste of mouthwash.     _X__ 3.  No Alcohol for 24 hours before or after surgery.   ___ 4.  Do Not Smoke or use e-cigarettes For 24 Hours Prior to Your Surgery.                 Do not use any chewable tobacco products for at least 6 hours prior to                 surgery.  ____  5.  Bring all medications with you on the day of surgery if instructed.   __x__  6.  Notify your doctor if there is any change in your medical condition      (cold, fever, infections).     Do not wear jewelry, make-up, hairpins, clips or nail polish. Do not wear lotions, powders, or perfumes. You may wear deodorant. Do not shave 48 hours prior to surgery. Men may shave face and neck. Do not bring valuables to the hospital.    [facility] is not responsible for any belongings or  valuables.  Contacts, dentures or bridgework may not be worn into surgery. Leave your suitcase in the car. After surgery it may be brought to your room. For patients admitted to the hospital, discharge time is determined by your treatment team.   Patients discharged the day of surgery will not be allowed to drive home.   Please read over the following fact sheets that you were given:    __x__ Take these medicines the morning of surgery with A SIP OF WATER:    1. levothyroxine (SYNTHROID) 50 MCG tablet  2. gabapentin (NEURONTIN) 100 MG capsule  3. hydrocortisone (CORTEF) 10 MG tablet  4.rosuvastatin (CRESTOR) 20 MG tablet  5.  6. ( Do Not Take  Valsartan)  _x___ Fleet Enema (as directed)  Morning of surgery before coming to the hospital   _x___ Shower the night before and the morning of the surgery.  ____ Use inhalers on the day of surgery  ____ Stop metformin 2 days prior to surgery    ____ Take 1/2 of usual insulin dose the night before surgery. No insulin the morning          of surgery.   __x__ Stop  ELIQUIS 5 MG TABS tablet  ( Last dose tonight)  ____ Stop Anti-inflammatories    __x__ Stop supplements until after surgery.  B Complex-C (B-COMPLEX WITH VITAMIN C) tablet,Glucosamine-Chondroitin-MSM (GLUCOSAMINE CHONDROIT MSM DS PO  __x__ Use C-Pap day of surgery while resting.

## 2019-12-30 ENCOUNTER — Other Ambulatory Visit
Admission: RE | Admit: 2019-12-30 | Discharge: 2019-12-30 | Disposition: A | Discharge status: Home / Self Care | Payer: Medicare Other | Source: Ambulatory Visit | Attending: Urology | Admitting: Urology

## 2019-12-30 DIAGNOSIS — Z01812 Encounter for preprocedural laboratory examination: Secondary | ICD-10-CM | POA: Diagnosis present

## 2019-12-30 DIAGNOSIS — Z20822 Contact with and (suspected) exposure to covid-19: Secondary | ICD-10-CM | POA: Diagnosis not present

## 2019-12-30 LAB — SARS CORONAVIRUS 2 (TAT 6-24 HRS): SARS Coronavirus 2: NEGATIVE

## 2019-12-31 MED ORDER — LEVOFLOXACIN IN D5W 500 MG/100ML IV SOLN
500.0000 mg | INTRAVENOUS | Status: DC
  Administered 2020-01-01: 500 mg via INTRAVENOUS

## 2019-12-31 MED ORDER — GENTAMICIN SULFATE 40 MG/ML IJ SOLN
80.0000 mg | Freq: Once | INTRAVENOUS | Status: DC
  Filled 2019-12-31: qty 2

## 2020-01-01 ENCOUNTER — Ambulatory Visit
Admission: RE | Admit: 2020-01-01 | Discharge: 2020-01-01 | Disposition: A | Discharge status: Home / Self Care | Payer: Medicare Other | Attending: Urology | Admitting: Urology

## 2020-01-01 ENCOUNTER — Other Ambulatory Visit: Payer: Self-pay

## 2020-01-01 ENCOUNTER — Encounter: Payer: Medicare Other | Admitting: Speech Pathology

## 2020-01-01 ENCOUNTER — Encounter: Payer: Medicare Other | Admitting: Physical Therapy

## 2020-01-01 ENCOUNTER — Encounter: Payer: Self-pay | Admitting: Urology

## 2020-01-01 ENCOUNTER — Encounter
Admission: RE | Disposition: A | Discharge status: Home / Self Care | Payer: Self-pay | Source: Home / Self Care | Attending: Urology

## 2020-01-01 ENCOUNTER — Ambulatory Visit: Payer: Medicare Other | Admitting: Anesthesiology

## 2020-01-01 DIAGNOSIS — I480 Paroxysmal atrial fibrillation: Secondary | ICD-10-CM | POA: Diagnosis not present

## 2020-01-01 DIAGNOSIS — Z85828 Personal history of other malignant neoplasm of skin: Secondary | ICD-10-CM | POA: Insufficient documentation

## 2020-01-01 DIAGNOSIS — E039 Hypothyroidism, unspecified: Secondary | ICD-10-CM | POA: Insufficient documentation

## 2020-01-01 DIAGNOSIS — K5900 Constipation, unspecified: Secondary | ICD-10-CM | POA: Insufficient documentation

## 2020-01-01 DIAGNOSIS — N401 Enlarged prostate with lower urinary tract symptoms: Secondary | ICD-10-CM | POA: Insufficient documentation

## 2020-01-01 DIAGNOSIS — Z96652 Presence of left artificial knee joint: Secondary | ICD-10-CM | POA: Diagnosis not present

## 2020-01-01 DIAGNOSIS — Z888 Allergy status to other drugs, medicaments and biological substances status: Secondary | ICD-10-CM | POA: Insufficient documentation

## 2020-01-01 DIAGNOSIS — I10 Essential (primary) hypertension: Secondary | ICD-10-CM | POA: Insufficient documentation

## 2020-01-01 DIAGNOSIS — Z7901 Long term (current) use of anticoagulants: Secondary | ICD-10-CM | POA: Insufficient documentation

## 2020-01-01 DIAGNOSIS — R001 Bradycardia, unspecified: Secondary | ICD-10-CM | POA: Insufficient documentation

## 2020-01-01 DIAGNOSIS — E7800 Pure hypercholesterolemia, unspecified: Secondary | ICD-10-CM | POA: Diagnosis not present

## 2020-01-01 DIAGNOSIS — C61 Malignant neoplasm of prostate: Secondary | ICD-10-CM | POA: Diagnosis not present

## 2020-01-01 DIAGNOSIS — Z87891 Personal history of nicotine dependence: Secondary | ICD-10-CM | POA: Diagnosis not present

## 2020-01-01 DIAGNOSIS — Z79899 Other long term (current) drug therapy: Secondary | ICD-10-CM | POA: Diagnosis not present

## 2020-01-01 DIAGNOSIS — N138 Other obstructive and reflux uropathy: Secondary | ICD-10-CM | POA: Insufficient documentation

## 2020-01-01 DIAGNOSIS — G4730 Sleep apnea, unspecified: Secondary | ICD-10-CM | POA: Diagnosis not present

## 2020-01-01 DIAGNOSIS — Z8673 Personal history of transient ischemic attack (TIA), and cerebral infarction without residual deficits: Secondary | ICD-10-CM | POA: Insufficient documentation

## 2020-01-01 DIAGNOSIS — E7119 Other disorders of branched-chain amino-acid metabolism: Secondary | ICD-10-CM | POA: Insufficient documentation

## 2020-01-01 HISTORY — PX: PROSTATE BIOPSY: SHX241

## 2020-01-01 SURGERY — BIOPSY, PROSTATE
Anesthesia: General | Site: Prostate

## 2020-01-01 MED ORDER — PROMETHAZINE HCL 25 MG/ML IJ SOLN: 6.2500 mg | INTRAMUSCULAR | Status: DC | PRN

## 2020-01-01 MED ORDER — GENTAMICIN IN SALINE 1.6-0.9 MG/ML-% IV SOLN
80.0000 mg | INTRAVENOUS | Status: AC
  Administered 2020-01-01: 80 mg via INTRAVENOUS
  Filled 2020-01-01: qty 50

## 2020-01-01 MED ORDER — FENTANYL CITRATE (PF) 100 MCG/2ML IJ SOLN: 25.0000 ug | INTRAMUSCULAR | Status: DC | PRN

## 2020-01-01 MED ORDER — GENTAMICIN SULFATE 40 MG/ML IJ SOLN
80.0000 mg | INTRAVENOUS | Status: DC
  Filled 2020-01-01: qty 2

## 2020-01-01 MED ORDER — PROPOFOL 10 MG/ML IV BOLUS
INTRAVENOUS | Status: AC
  Filled 2020-01-01: qty 40

## 2020-01-01 MED ORDER — FENTANYL CITRATE (PF) 100 MCG/2ML IJ SOLN
INTRAMUSCULAR | Status: AC
  Filled 2020-01-01: qty 2

## 2020-01-01 MED ORDER — LACTATED RINGERS IV SOLN: INTRAVENOUS | Status: DC

## 2020-01-01 MED ORDER — ACETAMINOPHEN 160 MG/5ML PO SOLN
325.0000 mg | ORAL | Status: DC | PRN
  Filled 2020-01-01: qty 20.3

## 2020-01-01 MED ORDER — ACETAMINOPHEN 325 MG PO TABS: 325.0000 mg | ORAL_TABLET | ORAL | Status: DC | PRN

## 2020-01-01 MED ORDER — GLYCOPYRROLATE 0.2 MG/ML IJ SOLN
INTRAMUSCULAR | Status: DC | PRN
  Administered 2020-01-01: .2 mg via INTRAVENOUS

## 2020-01-01 MED ORDER — GLYCOPYRROLATE 0.2 MG/ML IJ SOLN: INTRAMUSCULAR | Status: DC | PRN

## 2020-01-01 MED ORDER — SEVOFLURANE IN SOLN
RESPIRATORY_TRACT | Status: AC
  Filled 2020-01-01: qty 250

## 2020-01-01 MED ORDER — FLEET ENEMA 7-19 GM/118ML RE ENEM: 1.0000 | ENEMA | Freq: Once | RECTAL | Status: DC

## 2020-01-01 MED ORDER — PROPOFOL 500 MG/50ML IV EMUL
INTRAVENOUS | Status: DC | PRN
  Administered 2020-01-01: 125 ug/kg/min via INTRAVENOUS

## 2020-01-01 MED ORDER — LEVOFLOXACIN 500 MG PO TABS: 500.0000 mg | ORAL_TABLET | Freq: Every day | ORAL | 0 refills | Status: DC

## 2020-01-01 MED ORDER — FAMOTIDINE 20 MG PO TABS: 20.0000 mg | ORAL_TABLET | Freq: Once | ORAL | Status: AC

## 2020-01-01 MED ORDER — HYDROCODONE-ACETAMINOPHEN 7.5-325 MG PO TABS
1.0000 | ORAL_TABLET | Freq: Once | ORAL | Status: DC | PRN
  Filled 2020-01-01: qty 1

## 2020-01-01 MED ORDER — IRBESARTAN 150 MG PO TABS
300.0000 mg | ORAL_TABLET | Freq: Every day | ORAL | Status: DC
  Administered 2020-01-01: 10:00:00 300 mg via ORAL
  Filled 2020-01-01: qty 2

## 2020-01-01 MED ORDER — FAMOTIDINE 20 MG PO TABS
ORAL_TABLET | ORAL | Status: AC
  Administered 2020-01-01: 20 mg via ORAL
  Filled 2020-01-01: qty 1

## 2020-01-01 MED ORDER — LEVOFLOXACIN IN D5W 500 MG/100ML IV SOLN
INTRAVENOUS | Status: AC
  Filled 2020-01-01: qty 100

## 2020-01-01 MED ORDER — LIDOCAINE HCL (PF) 2 % IJ SOLN
INTRAMUSCULAR | Status: AC
  Filled 2020-01-01: qty 10

## 2020-01-01 SURGICAL SUPPLY — 10 items
COVER MAYO STAND REUSABLE (DRAPES) ×2 IMPLANT
COVER WAND RF STERILE (DRAPES) ×2 IMPLANT
GLOVE BIO SURGEON STRL SZ7 (GLOVE) ×4 IMPLANT
GUIDE NDL ENDOCAV 16-18 CVR (NEEDLE) IMPLANT
GUIDE NEEDLE ENDOCAV 16-18 CVR (NEEDLE) ×2 IMPLANT
INST BIOPSY MAXCORE 18GX25 (NEEDLE) ×2 IMPLANT
NDL GUIDE BIOPSY 644068 (NEEDLE) IMPLANT
NEEDLE GUIDE BIOPSY 644068 (NEEDLE) IMPLANT
SURGILUBE 2OZ TUBE FLIPTOP (MISCELLANEOUS) ×2 IMPLANT
TOWEL OR 17X26 4PK STRL BLUE (TOWEL DISPOSABLE) ×2 IMPLANT

## 2020-01-01 NOTE — H&P (Signed)
Date of Initial H&P: 12/09/19  History reviewed, patient examined, no change in status, stable for surgery.

## 2020-01-01 NOTE — Op Note (Signed)
Preoperative diagnosis: Elevated PSA  Postoperative diagnosis: Same  Procedure: Image guided [place] fusion transrectal ultrasound guided biopsy of the prostate  Surgeon: Otelia Limes. Yves Dill MD  Anesthesia: General  Indications:See the history and physical. After informed consent the above procedure(s) were requested     Technique and findings: After adequate general anesthesia had been obtained the patient was placed into left lateral decubitus position.  Finger sweep indicated that the rectal vault was clear.  The transrectal ultrasound probe was then placed and ultrasound images were acquired.  Ultrasound images were then fused with the MRI images.  The region of interest was identified and 5 core biopsies taken from this region.  This point standard systematic 12 core biopsies were taken.  The prostate ultrasound probe was then removed and procedure terminated.  Blood loss was minimal.  The procedure was terminated and the patient was transferred to the recovery room in stable condition.

## 2020-01-01 NOTE — Progress Notes (Signed)
Pt had to be in and out cath for 550 urine output. Dr. Yves Dill stated he could go home without urinating. He stated that he would call the patient later that after noon to make sure he was urinating okay.

## 2020-01-01 NOTE — Transfer of Care (Signed)
Immediate Anesthesia Transfer of Care Note  Patient: Andrew Callahan  Procedure(s) Performed: PROSTATE BIOPSY URO NAV FUSION (N/A Prostate)  Patient Location: PACU  Anesthesia Type:General  Level of Consciousness: drowsy  Airway & Oxygen Therapy: Patient connected to nasal cannula oxygen  Post-op Assessment: Report given to RN and Post -op Vital signs reviewed and stable  Post vital signs: Reviewed and stable  Last Vitals:  Vitals Value Taken Time  BP 107/77 01/01/20 0813  Temp    Pulse 54 01/01/20 0816  Resp 10 01/01/20 0816  SpO2 99 % 01/01/20 0816  Vitals shown include unvalidated device data.  Last Pain:  Vitals:   01/01/20 [id number]  TempSrc: Tympanic  PainSc: 0-No pain         Complications: No apparent anesthesia complications

## 2020-01-01 NOTE — Anesthesia Preprocedure Evaluation (Addendum)
Anesthesia Evaluation  Patient identified by MRN, date of birth, ID band Patient awake    Reviewed: Allergy & Precautions, H&P , NPO status , reviewed documented beta blocker date and time   Airway Mallampati: II  TM Distance: >3 FB Neck ROM: limited    Dental  (+) Caps, Chipped   Pulmonary sleep apnea , former smoker,    Pulmonary exam normal        Cardiovascular hypertension, Atrial Fibrillation  Rhythm:irregular     Neuro/Psych CVA, Residual Symptoms    GI/Hepatic neg GERD  ,  Endo/Other  Hypothyroidism   Renal/GU      Musculoskeletal   Abdominal   Peds  Hematology   Anesthesia Other Findings Past Medical History:  1.  Atrial fibrillation. 2.  Hypertension. 3.  Hypercholesterolemia. 4.  Panhypopituitarism.  5.  Hypothyroidism. 6.  History of stroke June 2020 which left him with dysarthria and difficulty with handwriting and balance issues. 7.  Sleep apnea. No date: Chronic constipation No date: Elevated prostate specific antigen (PSA) No date: History of kidney stones No date: Hyperlipidemia No date: Hypertension No date: Hypothyroid No date: Hypothyroidism No date: Paroxysmal A-fib ([facility]) No date: Sinus bradycardia No date: Sleep apnea     Comment:  uses CPAP No date: Stroke [facility]) Past Surgical History: 1997: BACK SURGERY     Comment:  L3, 4, 5 laminectomy  1998: BACK SURGERY     Comment:  hard wire removed, fusion of L3-5 2009: BACK SURGERY     Comment:  laminotomy/foraminotomy w/ decompression of nerve root,               facet thermal ablation L2-3.  2010: [place] 2007: KNEE SURGERY; Left     Comment:  Arthroscopic partial knee replacement, replaced medial               side  2014: MOHS SURGERY; Right     Comment:  squamous cell carcinoma 2008: NECK SURGERY     Comment:  fusion with hardwire in place C3 through C7  1994: pituitary adenoma     Comment:  benign No date:  SPINE SURGERY 2016: SPINE SURGERY     Comment:  fusion L4    Reproductive/Obstetrics                            Anesthesia Physical Anesthesia Plan  ASA: III  Anesthesia Plan: General   Post-op Pain Management:    Induction: Intravenous  PONV Risk Score and Plan: Ondansetron and Treatment may vary due to age or medical condition  Airway Management Planned: LMA  Additional Equipment:   Intra-op Plan:   Post-operative Plan: Extubation in OR  Informed Consent: I have reviewed the patients History and Physical, chart, labs and discussed the procedure including the risks, benefits and alternatives for the proposed anesthesia with the patient or authorized representative who has indicated his/her understanding and acceptance.     Dental Advisory Given  Plan Discussed with: CRNA  Anesthesia Plan Comments:         Anesthesia Quick Evaluation

## 2020-01-01 NOTE — Anesthesia Postprocedure Evaluation (Signed)
Anesthesia Post Note  Patient: Andrew Callahan  Procedure(s) Performed: PROSTATE BIOPSY URO NAV FUSION (N/A Prostate)  Patient location during evaluation: PACU Anesthesia Type: General Level of consciousness: awake and alert Pain management: pain level controlled Vital Signs Assessment: post-procedure vital signs reviewed and stable Respiratory status: spontaneous breathing, nonlabored ventilation, respiratory function stable and patient connected to nasal cannula oxygen Cardiovascular status: blood pressure returned to baseline and stable Postop Assessment: no apparent nausea or vomiting Anesthetic complications: no Comments: BP stable post urinary cath     Last Vitals:  Vitals:   01/01/20 1023 01/01/20 1055  BP: (!) 178/89 (!) 186/88  Pulse: (!) 50 (!) 48  Resp: 16 16  Temp: (!) 36.2 C   SpO2: 98% 99%    Last Pain:  Vitals:   01/01/20 1055  TempSrc:   PainSc: 0-No pain                 Alphonsus Sias

## 2020-01-01 NOTE — Discharge Instructions (Addendum)
Transrectal Ultrasound-Guided Prostate Biopsy, Care After This sheet gives you information about how to care for yourself after your procedure. Your doctor may also give you more specific instructions. If you have problems or questions, contact your doctor. What can I expect after the procedure? After the procedure, it is common to have:  Pain and discomfort in your butt, especially while sitting.  Pink-colored pee (urine), due to small amounts of blood in the pee.  Burning while peeing (urinating).  Blood in your poop (stool).  Bleeding from your butt.  Blood in your semen. Follow these instructions at home: Medicines  Take over-the-counter and prescription medicines only as told by your doctor.  If you were prescribed antibiotic medicine, take it as told by your doctor. Do not stop taking the antibiotic even if you start to feel better. Activity   Do not drive for 24 hours if you were given a medicine to help you relax (sedative) during your procedure.  Return to your normal activities as told by your doctor. Ask your doctor what activities are safe for you.  Ask your doctor when it is okay for you to have sex.  Do not lift anything that is heavier than 10 lb (4.5 kg), or the limit that you are told, until your doctor says that it is safe. General instructions   Drink enough water to keep your pee pale yellow.  Watch your pee, poop, and semen for new bleeding or bleeding that gets worse.  Keep all follow-up visits as told by your doctor. This is important. Contact a doctor if you:  Have blood clots in your pee or poop.  Notice that your pee smells bad or unusual.  Have very bad belly pain.  Have trouble peeing.  Notice that your lower belly feels firm.  Have blood in your pee for more than 2 weeks after the procedure.  Have blood in your semen for more than 2 months after the procedure.  Have problems getting an erection.  Feel sick to your stomach  (nauseous).  Throw up (vomit).  Have new or worse bleeding in your pee, poop, or semen. Get help right away if you:  Have a fever or chills.  Have bright red pee.  Have very bad pain that does not get better with medicine.  Cannot pee. Summary  After this procedure, it is common to have pain and discomfort around your butt, especially while sitting.  You may have blood in your pee and poop.  It is common to have blood in your semen for 1-2 months.  If you were prescribed antibiotic medicine, take it as told by your doctor. Do not stop taking the antibiotic even if you start to feel better.  Get help right away if you have a fever or chills. This information is not intended to replace advice given to you by your health care provider. Make sure you discuss any questions you have with your health care provider. Document Revised: 03/19/2019 Document Reviewed: 09/25/2017 Elsevier Patient Education  2020 [employer]   AMBULATORY SURGERY  DISCHARGE INSTRUCTIONS   1) The drugs that you were given will stay in your system until tomorrow so for the next 24 hours you should not:  A) Drive an automobile B) Make any legal decisions C) Drink any alcoholic beverage   2) You may resume regular meals tomorrow.  Today it is better to start with liquids and gradually work up to solid foods.  You may eat anything you prefer,   but it is better to start with liquids, then soup and crackers, and gradually work up to solid foods.   3) Please notify your doctor immediately if you have any unusual bleeding, trouble breathing, redness and pain at the surgery site, drainage, fever, or pain not relieved by medication.    4) Additional Instructions:        Please contact your physician with any problems or Same Day Surgery at [phone], Monday through Friday 6 am to 4 pm, or [facility] [place] Main number at [phone]. 

## 2020-01-02 LAB — SURGICAL PATHOLOGY

## 2020-01-06 ENCOUNTER — Encounter: Payer: Medicare Other | Admitting: Physical Therapy

## 2020-01-08 ENCOUNTER — Other Ambulatory Visit: Payer: Self-pay

## 2020-01-08 ENCOUNTER — Ambulatory Visit: Payer: Medicare Other | Admitting: Physical Therapy

## 2020-01-08 DIAGNOSIS — R278 Other lack of coordination: Secondary | ICD-10-CM

## 2020-01-08 DIAGNOSIS — R29898 Other symptoms and signs involving the musculoskeletal system: Secondary | ICD-10-CM

## 2020-01-08 DIAGNOSIS — M6208 Separation of muscle (nontraumatic), other site: Secondary | ICD-10-CM | POA: Diagnosis not present

## 2020-01-08 DIAGNOSIS — M533 Sacrococcygeal disorders, not elsewhere classified: Secondary | ICD-10-CM

## 2020-01-08 DIAGNOSIS — G8929 Other chronic pain: Secondary | ICD-10-CM

## 2020-01-08 NOTE — Therapy (Signed)
[place] MAIN [facility] SERVICES [address] [place], [place], [address] Phone: [phone]   Fax:  [phone]  Physical Therapy Treatment  Patient Details  Name: Andrew Callahan MRN: [id number] Date of Birth: 26-May-1934 Referring Provider (PT): Tammi Klippel    Encounter Date: 01/08/2020  PT End of Session - 01/08/20 1541    Visit Number  2    PT Start Time  0910    PT Stop Time  1004    PT Time Calculation (min)  54 min       Past Medical History:  Diagnosis Date  . Chronic constipation   . Elevated prostate specific antigen (PSA)   . History of kidney stones   . Hyperlipidemia   . Hypertension   . Hypothyroid   . Hypothyroidism   . Paroxysmal A-fib ([place])   . Sinus bradycardia   . Sleep apnea    uses CPAP  . Stroke [facility])     Past Surgical History:  Procedure Laterality Date  . BACK SURGERY  1997   L3, 4, 5 laminectomy   . BACK SURGERY  1998   hard wire removed, fusion of L3-5  . BACK SURGERY  2009   laminotomy/foraminotomy w/ decompression of nerve root, facet thermal ablation L2-3.   Marland Kitchen [place]  2010  . KNEE SURGERY Left 2007   Arthroscopic partial knee replacement, replaced medial side   . MOHS SURGERY Right 2014   squamous cell carcinoma  . NECK SURGERY  2008   fusion with hardwire in place C3 through C7   . pituitary adenoma  1994   benign  . PROSTATE BIOPSY N/A 01/01/2020   Procedure: PROSTATE BIOPSY URO NAV FUSION;  Surgeon: Royston Cowper, MD;  Location: [facility] ORS;  Service: Urology;  Laterality: N/A;  . SPINE SURGERY    . SPINE SURGERY  2016   fusion L4     There were no vitals filed for this visit.  Subjective Assessment - 01/08/20 0913    Subjective  Pt reports the portable cycler does not work for him and prefers to get a stationery bike.         [facility] PT Assessment - 01/08/20 0919      AROM   Overall AROM Comments  hip flexion in supine limited110 deg on L       Palpation    Spinal mobility  sideflexion B , FADDIR no longer limited    SI assessment   iliac crest equally level    Palpation comment  increased tightenss at paraspinals and medial scapular mm B ( decreased post Tx)                 Pelvic Floor Special Questions - 01/08/20 0923    Diastasis Recti  abdominal bulging 1 below sternum, 1 fingers with below umbilicus          [facility] Adult PT Treatment/Exercise - 01/08/20 1540      Therapeutic Activites    Other Therapeutic Activities  discussed set up for portable cycler       Neuro Re-ed    Neuro Re-ed Details   cued for depression of scapula, new HEP technique       Manual Therapy   Manual therapy comments  paraspinals, medial scapular mm B                   PT Long Term Goals - 12/25/19 1715      PT  LONG TERM GOAL #1   Title  Pt will improve his  FOTO score bowel from 36 to > 50 pts and urinary from 45 to > 54 pts in order to demo improved function    Time  10    Status  New    Target Date  03/04/20      PT LONG TERM GOAL #2   Title  Pt will report equal sensation with dermatome along L3, L5, S1 bilaterally in order to demo improved QOL    Baseline  Currently, pt notices B thighs get tired quickly. There is sharp sensations on R midthigh above knee with walking 3/10 intensity. Tingling sensation that remains when he brushes his hand on it and it disappears after a minute. 1/10 intensity.    Time  6    Period  Weeks    Status  New    Target Date  02/05/20      PT LONG TERM GOAL #3   Title  Pt will demo decreased abdominal bulging, more closure of rectus abdominal mm in order to improve intraabdominal pressure system for postural and motility improvements    Time  4    Status  New    Target Date  01/22/20      PT LONG TERM GOAL #4   Title  Pt will demo proper body mechanics in ADLs and technique with fitness routine to minimize straining on pelvic  floor/ abdominal/ spine and minimize worsening of Sx    Time  8     Period  Weeks    Status  New    Target Date  02/19/20      PT LONG TERM GOAL #5   Title  Pt will report increasing intake of water from 1 glass per day to 4 glasses ( 32 fl oz)  in order to improve bowel movements and bladder health    Time  2    Period  Weeks    Status  New    Target Date  01/08/20      Additional Long Term Goals   Additional Long Term Goals  Yes      PT LONG TERM GOAL #6   Title  Pt will demo proper deep core coordination to no longer strain with bowel movements and compliant with toileting mechanics in order to minimize abdominal/ pelvic floor straining    Time  4    Period  Weeks    Status  New    Target Date  01/22/20            Plan - 01/08/20 1542    Clinical Impression Statement  Pt demo'd more equal alignment of pelvic girdle, restored spinal mobility in sideflexion, decreased abdominal bulging but yet had limited anterior/ lateral diaphragmatic excursion. Applied manual Tx to address this deficit without complaints. Pt demo'd improved deep core coordination post Tx, progressed to deep core level 1 but withheld level 2 due to shakiness in R leg after long period in hooklying position. Added thoracic extension and sideflexion exercises. Pt continues to benefit from skilled PT    Personal Factors and Comorbidities  Comorbidity 3+;Past/Current Experience;Age    Comorbidities  Pt and wife moved from [place]. Pt does not have a pulmonologist currently here and would like a referral.   Pt getting a medical work up for a nodule on his prostate next week. Hx of Underwent 4 back surgeries (L3-5 laminectomy 1997, L3-5 rod removal 1998, laminotomy/ foraminotomy w/ decompression of nerve  root, facet ablation 2009, fusion of L4 region 2016 , pituitary adenomonty, A-fib, L partial knee replacement , neck fusion C3-7, Acute Ischemic Stroke  p/w dysarthria/ R facial droop ( 05/31/19), MOHS surgery for squamous cell removal on R hand. Enjoyed the gym preCOVID pandemic  bike 30 min, dumbbells. no sit up and crunches    Examination-Activity Limitations  Continence;Locomotion Level;Transfers;Lift;Toileting;Bed Mobility    Stability/Clinical Decision Making  Evolving/Moderate complexity    Rehab Potential  Good    PT Frequency  1x / week    PT Duration  --   10   PT Treatment/Interventions  Neuromuscular re-education;Manual techniques;Patient/family education;Therapeutic activities;Functional mobility training;Stair training;Moist Heat;Scar mobilization;Taping;Therapeutic exercise    Consulted and Agree with Plan of Care  Patient       Patient will benefit from skilled therapeutic intervention in order to improve the following deficits and impairments:  Decreased activity tolerance, Decreased endurance, Decreased knowledge of precautions, Difficulty walking, Decreased safety awareness, Increased muscle spasms, Improper body mechanics, Postural dysfunction, Decreased scar mobility, Decreased mobility, Decreased coordination, Hypomobility, Decreased strength, Decreased range of motion  Visit Diagnosis: Diastasis recti  Other lack of coordination  Other symptoms and signs involving the musculoskeletal system  Chronic bilateral low back pain without sciatica  Sacrococcygeal disorders, not elsewhere classified     Problem List There are no problems to display for this patient.   Jerl Mina ,PT, DPT, E-RYT  01/08/2020, 3:43 PM  [place] MAIN [facility] SERVICES [address] [place], [place], [address] Phone: [phone]   Fax:  [phone]  Name: Abigail Litfin MRN: [id number] Date of Birth: 1934/02/22

## 2020-01-08 NOTE — Patient Instructions (Addendum)
    Open book ( handout)   Deep core level 1 ( breathing)     ___  Seated cat cow  Brushing hand over thighs , chest arches  5 reps  To assist with bowel    Sidebends  10 reps    ___  Referral for pulmonologist Dr. Corrin Parker, MD

## 2020-01-14 ENCOUNTER — Other Ambulatory Visit: Payer: Self-pay | Admitting: Urology

## 2020-01-14 DIAGNOSIS — C61 Malignant neoplasm of prostate: Secondary | ICD-10-CM

## 2020-01-15 ENCOUNTER — Other Ambulatory Visit: Payer: Self-pay

## 2020-01-15 ENCOUNTER — Ambulatory Visit: Payer: Medicare Other | Attending: Neurology | Admitting: Physical Therapy

## 2020-01-15 DIAGNOSIS — R2681 Unsteadiness on feet: Secondary | ICD-10-CM | POA: Diagnosis present

## 2020-01-15 DIAGNOSIS — M545 Low back pain, unspecified: Secondary | ICD-10-CM

## 2020-01-15 DIAGNOSIS — G8929 Other chronic pain: Secondary | ICD-10-CM | POA: Diagnosis present

## 2020-01-15 DIAGNOSIS — R42 Dizziness and giddiness: Secondary | ICD-10-CM | POA: Insufficient documentation

## 2020-01-15 DIAGNOSIS — R278 Other lack of coordination: Secondary | ICD-10-CM | POA: Diagnosis present

## 2020-01-15 DIAGNOSIS — M533 Sacrococcygeal disorders, not elsewhere classified: Secondary | ICD-10-CM | POA: Diagnosis present

## 2020-01-15 DIAGNOSIS — R29898 Other symptoms and signs involving the musculoskeletal system: Secondary | ICD-10-CM | POA: Diagnosis present

## 2020-01-15 DIAGNOSIS — M6208 Separation of muscle (nontraumatic), other site: Secondary | ICD-10-CM

## 2020-01-15 NOTE — Patient Instructions (Addendum)
  Increasing water from 1 glass to 4 glasses per day    One room temp water first thing before coffee  2nd glass mid morning   3rd glass lunch  4th glass mid afternoon/ evening    ____

## 2020-01-16 NOTE — Therapy (Addendum)
[place] MAIN [facility] SERVICES [address] [place], [place], [address] Phone: [phone]   Fax:  [phone]  Physical Therapy Treatment / Discharge Summary   Patient Details  Name: Andrew Callahan MRN: [id number] Date of Birth: 10/13/1934 Referring Provider (PT): Tammi Klippel    Encounter Date: 01/15/2020  PT End of Session - 01/16/20 1255    Visit Number  3    Number of Visits  10    Date for PT Re-Evaluation  03/04/20   eval 12/24/19   PT Start Time  1000    PT Stop Time  1054    PT Time Calculation (min)  54 min    Activity Tolerance  Patient tolerated treatment well    Behavior During Therapy  [facility] for tasks assessed/performed       Past Medical History:  Diagnosis Date  . Chronic constipation   . Elevated prostate specific antigen (PSA)   . History of kidney stones   . Hyperlipidemia   . Hypertension   . Hypothyroid   . Hypothyroidism   . Paroxysmal A-fib ([place])   . Sinus bradycardia   . Sleep apnea    uses CPAP  . Stroke [facility])     Past Surgical History:  Procedure Laterality Date  . BACK SURGERY  1997   L3, 4, 5 laminectomy   . BACK SURGERY  1998   hard wire removed, fusion of L3-5  . BACK SURGERY  2009   laminotomy/foraminotomy w/ decompression of nerve root, facet thermal ablation L2-3.   Marland Kitchen [place]  2010  . KNEE SURGERY Left 2007   Arthroscopic partial knee replacement, replaced medial side   . MOHS SURGERY Right 2014   squamous cell carcinoma  . NECK SURGERY  2008   fusion with hardwire in place C3 through C7   . pituitary adenoma  1994   benign  . PROSTATE BIOPSY N/A 01/01/2020   Procedure: PROSTATE BIOPSY URO NAV FUSION;  Surgeon: Royston Cowper, MD;  Location: [facility] ORS;  Service: Urology;  Laterality: N/A;  . SPINE SURGERY    . SPINE SURGERY  2016   fusion L4     There were no vitals filed for this visit.  Subjective Assessment - 01/15/20 1007    Subjective  Pt 's wife reported they  saw Dr. Farrel Gordon, urologist and cancer was found in his prostate. Pt will be undergoing bone scan and lymph scan 01/28/20 and f/u with Dr. Boneta Lucks 2 days later.  Pt reports he slept better for the first time in a long time.  Pt is not drinking as water still. Pt did his stretches from last week.    Patient is accompained by:  Family member         [facility] PT Assessment - 01/16/20 1257      Posture/Postural Control   Posture Comments  improved diaphramgatic excursion, less thoracic flexion, self-corrected with upright posture, anterior tilt of pelvis       Palpation   Spinal mobility  signficantly decreased paraspinal mm tightness,                 Pelvic Floor Special Questions - 01/16/20 1256    Diastasis Recti  less abdominal bluging    External Perineal Exam  pt consented verbally     External Palpation  skin tags present, proper lengthening/ contraction .         [facility] Adult PT Treatment/Exercise - 01/16/20 1255  Therapeutic Activites    Other Therapeutic Activities  discussed the importance of water intake       Neuro Re-ed    Neuro Re-ed Details   cued for proper lengtehning and contraction of pelvic floor                   PT Long Term Goals - 01/16/20 1258      PT LONG TERM GOAL #1   Title  Pt will improve his  FOTO score bowel from 36 to > 50 pts and urinary from 45 to > 54 pts in order to demo improved function    Time  10    Status  Deferred      PT LONG TERM GOAL #2   Title  Pt will report equal sensation with dermatome along L3, L5, S1 bilaterally in order to demo improved QOL    Baseline  Currently, pt notices B thighs get tired quickly. There is sharp sensations on R midthigh above knee with walking 3/10 intensity. Tingling sensation that remains when he brushes his hand on it and it disappears after a minute. 1/10 intensity.    Time  6    Period  Weeks    Status  Not Met      PT LONG TERM GOAL #3   Title  Pt will demo decreased  abdominal bulging, more closure of rectus abdominal mm in order to improve intraabdominal pressure system for postural and motility improvements    Time  4    Status  Achieved      PT LONG TERM GOAL #4   Title  Pt will demo proper body mechanics in ADLs and technique with fitness routine to minimize straining on pelvic  floor/ abdominal/ spine and minimize worsening of Sx    Time  8    Period  Weeks    Status  Achieved      PT LONG TERM GOAL #5   Title  Pt will report increasing intake of water from 1 glass per day to 4 glasses ( 32 fl oz)  in order to improve bowel movements and bladder health    Time  2    Period  Weeks    Status  Not Met      PT LONG TERM GOAL #6   Title  Pt will demo proper deep core coordination to no longer strain with bowel movements and compliant with toileting mechanics in order to minimize abdominal/ pelvic floor straining    Time  4    Period  Weeks    Status  Achieved            Plan - 01/16/20 1258    Clinical Impression Statement Pt achieved 3/6 goals and pt has made musculoskeletal improvements that will likely facilitate motility and bowel elimination as long as pt is compliant with increased  water intake. The following MSK improvements that improve intraabdominal pressure system for bowel and urinary health include:    diaphragmatic excursion/ deep core coordination/ proper pelvic floor lengthening/ less thoracic kyphosis/ more anterior tilt of pelvis/ less abdominal bulging     However, an additional factor that may impact the rectal walls can also be related to his recent Dx of prostate cancer. Wife and pt explained they found out pt's Dx of an aggressive type of prostate cancer last week and will undergo further tests of bone and lymph to understand the status of the spreading of the cancer.    Plan  to communicate with Dr. Yves Dill  ( urologist) and referring provider Tammi Klippel, PA-C ( GI ) re: pt's latest CA Dx.    Pt is d/c at this  time.     Personal Factors and Comorbidities  Comorbidity 3+;Past/Current Experience;Age    Comorbidities  Pt and wife moved from [place]. Pt does not have a pulmonologist currently here and would like a referral.   Pt getting a medical work up for a nodule on his prostate next week. Hx of Underwent 4 back surgeries (L3-5 laminectomy 1997, L3-5 rod removal 1998, laminotomy/ foraminotomy w/ decompression of nerve root, facet ablation 2009, fusion of L4 region 2016 , pituitary adenomonty, A-fib, L partial knee replacement , neck fusion C3-7, Acute Ischemic Stroke  p/w dysarthria/ R facial droop ( 05/31/19), MOHS surgery for squamous cell removal on R hand. Enjoyed the gym preCOVID pandemic bike 30 min, dumbbells. no sit up and crunches    Examination-Activity Limitations  Continence;Locomotion Level;Transfers;Lift;Toileting;Bed Mobility    Stability/Clinical Decision Making  Evolving/Moderate complexity    Rehab Potential  Good    PT Frequency  1x / week    PT Duration  --   10   PT Treatment/Interventions  Neuromuscular re-education;Manual techniques;Patient/family education;Therapeutic activities;Functional mobility training;Stair training;Moist Heat;Scar mobilization;Taping;Therapeutic exercise    Consulted and Agree with Plan of Care  Patient       Patient will benefit from skilled therapeutic intervention in order to improve the following deficits and impairments:  Decreased activity tolerance, Decreased endurance, Decreased knowledge of precautions, Difficulty walking, Decreased safety awareness, Increased muscle spasms, Improper body mechanics, Postural dysfunction, Decreased scar mobility, Decreased mobility, Decreased coordination, Hypomobility, Decreased strength, Decreased range of motion  Visit Diagnosis: No diagnosis found.     Problem List There are no problems to display for this patient.   Jerl Mina ,PT, DPT, E-RYT  01/16/2020, 1:08 PM  [place] MAIN [facility] SERVICES [address] [place], [place], [address] Phone: [phone]   Fax:  [phone]  Name: Garnell Phenix MRN: [id number] Date of Birth: 1934/08/30

## 2020-01-21 ENCOUNTER — Other Ambulatory Visit: Payer: Self-pay

## 2020-01-21 ENCOUNTER — Ambulatory Visit: Payer: Medicare Other

## 2020-01-21 DIAGNOSIS — R42 Dizziness and giddiness: Secondary | ICD-10-CM

## 2020-01-21 DIAGNOSIS — R2681 Unsteadiness on feet: Secondary | ICD-10-CM

## 2020-01-21 DIAGNOSIS — M6208 Separation of muscle (nontraumatic), other site: Secondary | ICD-10-CM | POA: Diagnosis not present

## 2020-01-21 NOTE — Therapy (Signed)
[place] MAIN [facility] SERVICES [address] [place], [place], [address] Phone: [phone]   Fax:  [phone]  Physical Therapy Evaluation  Patient Details  Name: Andrew Callahan MRN: [id number] Date of Birth: [birth year] Referring Provider (PT): Dr. Tami Ribas   Encounter Date: 01/21/2020  PT End of Session - 01/22/20 0843    Visit Number  1    Number of Visits  25    Date for PT Re-Evaluation  04/15/20    PT Start Time  1600    PT Stop Time  [id number]    PT Time Calculation (min)  75 min    Equipment Utilized During Treatment  Gait belt    Activity Tolerance  Patient tolerated treatment well    Behavior During Therapy  [facility] for tasks assessed/performed       Past Medical History:  Diagnosis Date  . Chronic constipation   . Elevated prostate specific antigen (PSA)   . History of kidney stones   . Hyperlipidemia   . Hypertension   . Hypothyroid   . Hypothyroidism   . Paroxysmal A-fib ([place])   . Sinus bradycardia   . Sleep apnea    uses CPAP  . Stroke [facility])     Past Surgical History:  Procedure Laterality Date  . BACK SURGERY  1997   L3, 4, 5 laminectomy   . BACK SURGERY  1998   hard wire removed, fusion of L3-5  . BACK SURGERY  2009   laminotomy/foraminotomy w/ decompression of nerve root, facet thermal ablation L2-3.   Marland Kitchen [place]  2010  . KNEE SURGERY Left 2007   Arthroscopic partial knee replacement, replaced medial side   . MOHS SURGERY Right 2014   squamous cell carcinoma  . NECK SURGERY  2008   fusion with hardwire in place C3 through C7   . pituitary adenoma  1994   benign  . PROSTATE BIOPSY N/A 01/01/2020   Procedure: PROSTATE BIOPSY URO NAV FUSION;  Surgeon: Royston Cowper, MD;  Location: [facility] ORS;  Service: Urology;  Laterality: N/A;  . SPINE SURGERY    . SPINE SURGERY  2016   fusion L4     There were no vitals filed for this visit.   Subjective Assessment - 01/21/20 1606    Subjective  Dizziness  and imbalance    Patient is accompained by:  Family member    Pertinent History  Pt reports that he has had progressive worsening of balance for the last couple years. He has seen neurology and ENT and was referred by Dr. Tami Ribas for dizziness but mostly attributed to multifactorial balance deficits. He does have a history of BPPV in the past. 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 Pt enjoyed the gym preCOVID pandemic (bike 30 min, dumbbells). He is interested in getting an exercise bike for home as he has not been able to return to the gym. Denies history of headaches/migraines. He was treated for BPPV in the past down in [place] and it sounds like he has had a VNG study in the past but does not recall any findings.  Limitations  Walking    Patient Stated Goals  Improve balance and establish a home exercise program    Currently in Pain?  No/denies       VESTIBULAR AND BALANCE EVALUATION   HISTORY:  Subjective history of current problem:  Pt reports that he has had progressive worsening of balance for the last couple years. He has seen neurology and ENT and was referred by Dr. Tami Ribas for dizziness but mostly attributed to multifactorial balance deficits. He does have a history of BPPV in the past. 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 Pt enjoyed the gym preCOVID pandemic (bike 30 min, dumbbells). He is interested in getting an exercise bike for home as he has not been able to return to the gym. Denies history of headaches/migraines. He was treated for BPPV in the past down in [place] and it sounds like he has had a VNG study in the past but does not recall any findings.  Description of dizziness: (vertigo, unsteadiness, lightheadedness, falling, general unsteadiness, whoozy, swimmy-headed sensation, aural fullness) unsteadiness, occasional whooziness when laying down Frequency: infrequently Duration: 1 minute Symptom nature: (motion provoked, positional, spontaneous, constant, variable, intermittent) motion-provoked  Provocative Factors: laying down, turning quickly Easing Factors: rest  Progression of symptoms: (better, worse, no change since onset)  History of similar episodes: None  Falls (yes/no): No Number of falls in past 6 months: No  Prior Functional Level: Independent with ADLs/IADLs  Auditory complaints (tinnitus, pain, drainage, hearing loss, aural fullness): bilateral tinnitus for multiple years,  Vision (diplopia, visual field loss, recent changes, last eye exam): bifocals, last eye exam "a few months ago";  Red Flags: (dysarthria, dysphagia, drop attacks, bowel and bladder changes, recent weight loss/gain) Dysarthria and dysphagia since CVA,  Otherwise review of systems negative for red flags.     EXAMINATION  POSTURE:   NEUROLOGICAL SCREEN: (2+ unless otherwise noted.) N=normal  Ab=abnormal  Level Dermatome R L Myotome R L Reflex R L  C3 Anterior Neck N N Sidebend C2-3 N N Jaw CN V    C4 Top of Shoulder N N Shoulder Shrug C4 N N Hoffman's UMN    C5 Lateral Upper Arm N N Shoulder ABD C4-5 N N Biceps C5-6    C6 Lateral Arm/  Thumb N N Arm Flex/ Wrist Ext C5-6 N N Brachiorad. C5-6    C7 Middle Finger N N Arm Ext//Wrist Flex C6-7 N N Triceps C7    C8 4th & 5th Finger N N Flex/ Ext Carpi Ulnaris C8 N N Patellar (L3-4)    T1 Medial Arm N N Interossei T1 N N Gastrocnemius    L2 Medial thigh/groin N A Illiopsoas (L2-3) N N     L3 Lower thigh/med.knee N A Quadriceps (L3-4) N N     L4 Medial leg/lat thigh N A Tibialis Ant (L4-5) A N     L5 Lat. leg & dorsal foot N A EHL (L5) N N     S1 post/lat foot/thigh/leg N A Gastrocnemius (S1-2) N N     S2 Post./med. thigh & leg N A Hamstrings (L4-S3) N N  Reflex testing deferred  Cranial Nerves Visual acuity and visual fields are intact  Extraocular muscles are intact  Facial sensation is intact bilaterally  Facial droop R corner of mouth with slurred speech Hearing is assisted by bilateral hearing aids Palate elevates midline, normal phonation  Shoulder shrug strength is intact  Tongue protrudes midline    SOMATOSENSORY:         Sensation           Intact      Diminished         Absent  Light touch  LLE diminished throughout (chronic)      COORDINATION: Finger to Nose: Mildly dysmetric bilaterally Heel to Shin: Normal Pronator Drift: Negative Rapid Alternating Movements: Normal Finger to Thumb Opposition: Normal   MUSCULOSKELETAL SCREEN: Cervical Spine ROM: Severe loss of cervical extension and L lateral flexion, moderate loss of R lateral flexion and bilateral cervical rotation, mild loss of flexion   ROM: WNL  MMT: 4-/5 R ankle DF and R hip flexion, 4/5 L ankle DF, otherwise grossly symmetrical BUE/BLE at least 4+/5;  Functional Mobility: Independent ambulation without assistive device. Pt with wide BOS during gait with decreased toe to floor clearance and foot slap bilaterally but worse on the R.    POSTURAL CONTROL TESTS:   Clinical Test of Sensory Interaction for Balance    (CTSIB):  CONDITION TIME STRATEGY SWAY  Eyes open, firm surface 30  seconds ankle 1+  Eyes closed, firm surface 30 seconds ankle 3+  Eyes open, foam surface 30 seconds ankle 2+  Eyes closed, foam surface 3-5 seconds ankle 4+    OCULOMOTOR / VESTIBULAR TESTING:  Oculomotor Exam- Room Light  Findings Comments  Ocular Alignment normal   Ocular ROM normal   Spontaneous Nystagmus normal   Gaze-Holding Nystagmus normal   End-Gaze Nystagmus normal   Vergence (normal 2-3") normal   Smooth Pursuit abnormal Saccadic  Cross-Cover Test not examined   Saccades normal   VOR Cancellation abnormal Appears saccadic, denies dizziness  Left Head Impulse normal   Right Head Impulse normal   Static Acuity not examined   Dynamic Acuity not examined     Oculomotor Exam- Fixation Suppressed: Deferred at this time  BPPV TESTS:  Symptoms Duration Intensity Nystagmus  L Dix-Hallpike None   None  R Dix-Hallpike "Whooziness" 10-15s mild Faint upbeating R torsional nystagmus  L Head Roll Not performed     R Head Roll Not performed      L Sidelying Test      R Sidelying Test        FUNCTIONAL OUTCOME MEASURES   Results Comments  BERG 51/56 Fall risk, in need of intervention  DGI 21/24 Fall risk  5TSTS 15.3 seconds Fall risk  10 Meter Gait Speed Self-selected: 13.3s = 0.75 m/s m/s; Fastest: 8.5s = 1.18 m/s Below normative values for full community ambulation            Objective measurements completed on examination: See above findings.      Canalith Repositioning Treatment Pt treated with 1 bout of Epley Maneuver for presumed R posterior canal BPPV. Utilized inverted mat table due to limited cervical extension. 1 minute holds in each position. Education provided to patient and wife as well as handout regarding BPPV.           PT Short Term Goals - 01/22/20 1247      PT SHORT TERM GOAL #1   Title  Pt will be independent with HEP in  order to improve strength and balance in order to decrease fall risk and improve function at home.    Time  6     Period  Weeks    Status  New    Target Date  03/04/20        PT Long Term Goals - 01/22/20 1247      PT LONG TERM GOAL #1   Title  Pt will improve BERG by at least 3 points in order to demonstrate clinically significant improvement in balance.    Baseline  01/22/20: 54/56    Time  12    Period  Weeks    Status  New    Target Date  04/15/20      PT LONG TERM GOAL #2   Title  Pt will improve DGI by at least 3 points in order to demonstrate clinically significant improvement in balance and decreased risk for falls.    Baseline  01/22/20: 21/24    Time  12    Period  Weeks    Status  New    Target Date  04/15/20      PT LONG TERM GOAL #3   Title  Pt will have no further bouts of vertigo when turning over in bed or laying down in order to decrease fall risk    Time  12    Period  Weeks    Status  New    Target Date  04/15/20      PT LONG TERM GOAL #4   Title  Pt will increase self-selected 10MWT by at least 0.13 m/s in order to demonstrate clinically significant improvement in community ambulation.    Baseline  01/22/20: 13.3s = 0.75 m/s    Time  12    Period  Weeks    Status  New    Target Date  04/15/20             Plan - 01/22/20 0844    Clinical Impression Statement  Pt is a pleasant 86 year-old male referred for difficulty with balance. Pt had a stroke 05/31/19 with R facial droop and slurred speech. He has some mild RLE weakness in hip flexion and R ankle dorsiflexion that pt reports are pre-existing from cervical surgery. He has some balance deficits as identified by DGI of 21/24 and BERG of 51/56. Oculomotor/vestibular testing reveals saccadic smooth pursuits and positive Dix-Hallpike on the R side. Pt reports he has been treated in the past for BPPV but does not recall which side. One bout of Epley performed with patient today during the evaluation. Pt presents with deficits in strength, gait and balance. He will benefit from skilled PT services to address deficits  in balance and decrease risk for future falls.    Personal Factors and Comorbidities  Comorbidity 3+;Past/Current Experience;Age    Comorbidities  Spinal surgeries, CVA, A-fib, HTN    Examination-Activity Limitations  Locomotion Level;Transfers    Examination-Participation Restrictions  Community Activity;Shop    Stability/Clinical Decision Making  Evolving/Moderate complexity    Clinical Decision Making  High    Rehab Potential  Good    PT Frequency  2x / week    PT Duration  12 weeks   10   PT Treatment/Interventions  Neuromuscular re-education;Manual techniques;Patient/family education;Therapeutic activities;Functional mobility training;Stair training;Moist Heat;Therapeutic exercise;ADLs/Self Care Home Management;Aquatic Therapy;Biofeedback;Ultrasound;Canalith Repostioning;Cryotherapy;Electrical Stimulation;Iontophoresis 4mg /ml Dexamethasone;Traction;DME Instruction;Gait training;Balance training;Passive range of motion;Dry needling;Vestibular    PT Next Visit Plan  Complete FOTO, Retest Dix-Hallpike and treat as appropriate (R  side positive during eval), Initiate HEP    PT Home Exercise Plan  None currently    Consulted and Agree with Plan of Care  Patient;Family member/caregiver    Family Member Consulted  Wife       Patient will benefit from skilled therapeutic intervention in order to improve the following deficits and impairments:  Decreased strength, Abnormal gait, Decreased balance, Dizziness  Visit Diagnosis: Dizziness and giddiness  Unsteadiness on feet     Problem List There are no problems to display for this patient.  Phillips Grout PT, DPT, GCS  Deandria Klute 01/22/2020, 12:55 PM  [place] MAIN [facility] SERVICES [address] [place], [place], [address] Phone: [phone]   Fax:  [phone]  Name: Andrew Callahan MRN: [id number] Date of Birth: [birth year]

## 2020-01-22 ENCOUNTER — Other Ambulatory Visit: Payer: Self-pay

## 2020-01-22 ENCOUNTER — Encounter: Payer: Medicare Other | Admitting: Physical Therapy

## 2020-01-22 ENCOUNTER — Ambulatory Visit: Payer: Medicare Other

## 2020-01-22 DIAGNOSIS — R42 Dizziness and giddiness: Secondary | ICD-10-CM

## 2020-01-22 DIAGNOSIS — R2681 Unsteadiness on feet: Secondary | ICD-10-CM

## 2020-01-22 DIAGNOSIS — M6208 Separation of muscle (nontraumatic), other site: Secondary | ICD-10-CM | POA: Diagnosis not present

## 2020-01-23 NOTE — Therapy (Signed)
[place] MAIN [facility] SERVICES [address] [place], [place], [address] Phone: [phone]   Fax:  [phone]  Physical Therapy Treatment  Patient Details  Name: Keyon Sada MRN: [id number] Date of Birth: Apr 21, 1934 Referring Provider (PT): Dr. Tami Ribas   Encounter Date: 01/22/2020  PT End of Session - 01/23/20 1152    Visit Number  2    Number of Visits  25    Date for PT Re-Evaluation  04/15/20    PT Start Time  [id number]    PT Stop Time  1645    PT Time Calculation (min)  40 min    Equipment Utilized During Treatment  Gait belt    Activity Tolerance  Patient tolerated treatment well    Behavior During Therapy  [facility] for tasks assessed/performed       Past Medical History:  Diagnosis Date  . Chronic constipation   . Elevated prostate specific antigen (PSA)   . History of kidney stones   . Hyperlipidemia   . Hypertension   . Hypothyroid   . Hypothyroidism   . Paroxysmal A-fib ([place])   . Sinus bradycardia   . Sleep apnea    uses CPAP  . Stroke [facility])     Past Surgical History:  Procedure Laterality Date  . BACK SURGERY  1997   L3, 4, 5 laminectomy   . BACK SURGERY  1998   hard wire removed, fusion of L3-5  . BACK SURGERY  2009   laminotomy/foraminotomy w/ decompression of nerve root, facet thermal ablation L2-3.   Marland Kitchen [place]  2010  . KNEE SURGERY Left 2007   Arthroscopic partial knee replacement, replaced medial side   . MOHS SURGERY Right 2014   squamous cell carcinoma  . NECK SURGERY  2008   fusion with hardwire in place C3 through C7   . pituitary adenoma  1994   benign  . PROSTATE BIOPSY N/A 01/01/2020   Procedure: PROSTATE BIOPSY URO NAV FUSION;  Surgeon: Royston Cowper, MD;  Location: [facility] ORS;  Service: Urology;  Laterality: N/A;  . SPINE SURGERY    . SPINE SURGERY  2016   fusion L4     Vitals:   01/22/20 1620  BP: (!) (P) 160/70  Pulse: (!) (P) 48    Subjective Assessment - 01/23/20 1151     Subjective  Pt reports that last night he had severe vertigo everytime he got up to go to the bathroom. He called therapist for advice and was instructed to come back for an appointment today. Otherwise no specific questions or concerns at this time.    Patient is accompained by:  Family member    Pertinent History  Pt reports that he has had progressive worsening of balance for the last couple years. He has seen neurology and ENT and was referred by Dr. Tami Ribas for dizziness but mostly attributed to multifactorial balance deficits. He does have a history of BPPV in the past. 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 Pt enjoyed the gym preCOVID pandemic (bike 30 min, dumbbells). He is interested in getting an exercise bike  for home as he has not been able to return to the gym. Denies history of headaches/migraines. He was treated for BPPV in the past down in [place] and it sounds like he has had a VNG study in the past but does not recall any findings.    Limitations  Walking    Patient Stated Goals  Improve balance and establish a home exercise program    Currently in Pain?  No/denies           TREATMENT   Neuromuscular Re-education  Dix-hallpike performed bilaterally with a very faint positive on the R side for upbeating R torsional nystagmus of very short duration (10-15s) with patient reporting a very mild sensation of vertigo. L side is negative. Negative bilateral sidelying and roll tests. Orthostatic vitals obtained: Supine: BP: 160/70, HR: 48 Sitting: BP: 147/78, HR:  53 Standing (1 minute): BP: 147/74, HR: 48 Standing (3 minutes): BP: 153/77, HR: 55 Offered to perform Epley maneuver for patient however he is concerned that symptoms might worsen so he declines. He has a lot of upcoming appointments next week. Pt and wife provided handout with extensive education about how to perform Epley at home for R side with pillow behind shoulder blades for improved comfort compared to head hang off edge of bed. Demonstration performed multiple times with patient and wife.      Pt returned due to severe vertigo last night when getting up to go to the restroom following the initial evaluation. He did not have symptoms the rest of the day following the evaluation until that night. Dix-hallpike performed bilaterally today with a very faint positive on the R side for upbeating R torsional nystagmus of very short duration (10-15s) with patient reporting a very mild sensation of vertigo. Left side is negative. Negative bilateral sidelying and roll tests. Offered to perform Epley maneuver for patient however he is concerned that symptoms might worsen so he declines. He has a lot of upcoming appointments next week. Pt and wife provided handout with extensive education about how to perform Epley at home for R side with pillow behind shoulder blades for improved comfort compared to head hang off edge of bed. Demonstration performed multiple times with patient and wife. They will follow-up for additional therapy pending the results of his cancer testing next week. Pt encouraged to contact therapist with any further concerns. Reviewed signs/symptoms of stroke with patient and wife and encouraged them to call 911 if any of these symptoms develop. Pt will benefit from PT services to address deficits in dizziness and balance in order to return to full function at home.               PT Short Term Goals - 01/22/20 1247      PT SHORT TERM GOAL #1   Title  Pt will be independent with  HEP in order to improve strength and balance in order to decrease fall risk and improve function at home.    Time  6    Period  Weeks    Status  New    Target Date  03/04/20        PT Long Term Goals - 01/22/20 1247      PT LONG TERM GOAL #1   Title  Pt will improve BERG by at least 3 points in order to demonstrate clinically significant improvement in balance.    Baseline  01/22/20: 54/56    Time  12    Period  Weeks  Status  New    Target Date  04/15/20      PT LONG TERM GOAL #2   Title  Pt will improve DGI by at least 3 points in order to demonstrate clinically significant improvement in balance and decreased risk for falls.    Baseline  01/22/20: 21/24    Time  12    Period  Weeks    Status  New    Target Date  04/15/20      PT LONG TERM GOAL #3   Title  Pt will have no further bouts of vertigo when turning over in bed or laying down in order to decrease fall risk    Time  12    Period  Weeks    Status  New    Target Date  04/15/20      PT LONG TERM GOAL #4   Title  Pt will increase self-selected 10MWT by at least 0.13 m/s in order to demonstrate clinically significant improvement in community ambulation.    Baseline  01/22/20: 13.3s = 0.75 m/s    Time  12    Period  Weeks    Status  New    Target Date  04/15/20            Plan - 01/23/20 1153    Clinical Impression Statement  Pt returned due to severe vertigo last night when getting up to go to the restroom following the initial evaluation. He did not have symptoms the rest of the day following the evaluation until that night. Dix-hallpike performed bilaterally today with a very faint positive on the R side for upbeating R torsional nystagmus of very short duration (10-15s) with patient reporting a very mild sensation of vertigo. Left side is negative. Negative bilateral sidelying and roll tests. Offered to perform Epley maneuver for patient however he is concerned that symptoms might worsen so he declines. He  has a lot of upcoming appointments next week. Pt and wife provided handout with extensive education about how to perform Epley at home for R side with pillow behind shoulder blades for improved comfort compared to head hang off edge of bed. Demonstration performed multiple times with patient and wife. They will follow-up for additional therapy pending the results of his cancer testing next week. Pt encouraged to contact therapist with any further concerns. Reviewed signs/symptoms of stroke with patient and wife and encouraged them to call 911 if any of these symptoms develop. Pt will benefit from PT services to address deficits in dizziness and balance in order to return to full function at home.    Personal Factors and Comorbidities  Comorbidity 3+;Past/Current Experience;Age    Comorbidities  Spinal surgeries, CVA, A-fib, HTN    Examination-Activity Limitations  Locomotion Level;Transfers    Examination-Participation Restrictions  Community Activity;Shop    Stability/Clinical Decision Making  Evolving/Moderate complexity    Rehab Potential  Good    PT Frequency  2x / week    PT Duration  12 weeks   10   PT Treatment/Interventions  Neuromuscular re-education;Manual techniques;Patient/family education;Therapeutic activities;Functional mobility training;Stair training;Moist Heat;Therapeutic exercise;ADLs/Self Care Home Management;Aquatic Therapy;Biofeedback;Ultrasound;Canalith Repostioning;Cryotherapy;Electrical Stimulation;Iontophoresis 4mg /ml Dexamethasone;Traction;DME Instruction;Gait training;Balance training;Passive range of motion;Dry needling;Vestibular    PT Next Visit Plan  Retest Dix-Hallpike and treat as appropriate (R side positive during eval), Initiate HEP    PT Home Exercise Plan  Home Epley as needed    Consulted and Agree with Plan of Care  Patient;Family member/caregiver    Family Member Consulted  Wife       Patient will benefit from skilled therapeutic intervention in order to  improve the following deficits and impairments:  Decreased strength, Abnormal gait, Decreased balance, Dizziness  Visit Diagnosis: Dizziness and giddiness  Unsteadiness on feet     Problem List There are no problems to display for this patient.  Phillips Grout PT, DPT, GCS  Kensleigh Gates 01/23/2020, 12:01 PM  [place] MAIN [facility] SERVICES [address] [place], [place], [address] Phone: [phone]   Fax:  [phone]  Name: Juliann Keeley MRN: [id number] Date of Birth: Oct 31, 1934

## 2020-01-28 ENCOUNTER — Ambulatory Visit
Admission: RE | Admit: 2020-01-28 | Discharge: 2020-01-28 | Disposition: A | Discharge status: Home / Self Care | Payer: Medicare Other | Source: Ambulatory Visit | Attending: Urology | Admitting: Urology

## 2020-01-28 ENCOUNTER — Other Ambulatory Visit: Payer: Self-pay

## 2020-01-28 DIAGNOSIS — C61 Malignant neoplasm of prostate: Secondary | ICD-10-CM

## 2020-01-28 MED ORDER — IOHEXOL 300 MG/ML  SOLN
100.0000 mL | Freq: Once | INTRAMUSCULAR | Status: AC | PRN
  Administered 2020-01-28: 100 mL via INTRAVENOUS

## 2020-01-28 MED ORDER — TECHNETIUM TC 99M MEDRONATE IV KIT
20.0000 | PACK | Freq: Once | INTRAVENOUS | Status: AC | PRN
  Administered 2020-01-28: 09:00:00 23.3 via INTRAVENOUS

## 2020-01-29 ENCOUNTER — Encounter: Payer: Medicare Other | Admitting: Physical Therapy

## 2020-01-31 ENCOUNTER — Other Ambulatory Visit: Payer: Self-pay

## 2020-01-31 ENCOUNTER — Encounter: Payer: Self-pay | Admitting: Emergency Medicine

## 2020-01-31 ENCOUNTER — Emergency Department
Admission: EM | Admit: 2020-01-31 | Discharge: 2020-01-31 | Disposition: A | Discharge status: Home / Self Care | Payer: Medicare Other | Attending: Emergency Medicine | Admitting: Emergency Medicine

## 2020-01-31 DIAGNOSIS — C61 Malignant neoplasm of prostate: Secondary | ICD-10-CM | POA: Insufficient documentation

## 2020-01-31 DIAGNOSIS — I10 Essential (primary) hypertension: Secondary | ICD-10-CM | POA: Diagnosis not present

## 2020-01-31 DIAGNOSIS — E039 Hypothyroidism, unspecified: Secondary | ICD-10-CM | POA: Insufficient documentation

## 2020-01-31 DIAGNOSIS — Z8673 Personal history of transient ischemic attack (TIA), and cerebral infarction without residual deficits: Secondary | ICD-10-CM | POA: Insufficient documentation

## 2020-01-31 DIAGNOSIS — R339 Retention of urine, unspecified: Secondary | ICD-10-CM | POA: Insufficient documentation

## 2020-01-31 LAB — URINALYSIS, COMPLETE (UACMP) WITH MICROSCOPIC
Bacteria, UA: NONE SEEN
Bilirubin Urine: NEGATIVE
Glucose, UA: NEGATIVE mg/dL
Hgb urine dipstick: NEGATIVE
Ketones, ur: NEGATIVE mg/dL
Leukocytes,Ua: NEGATIVE
Nitrite: NEGATIVE
Protein, ur: NEGATIVE mg/dL
Specific Gravity, Urine: 1.01 (ref 1.005–1.030)
pH: 6 (ref 5.0–8.0)

## 2020-01-31 MED ORDER — LIDOCAINE HCL URETHRAL/MUCOSAL 2 % EX GEL
1.0000 "application " | Freq: Once | CUTANEOUS | Status: AC
  Administered 2020-01-31: 1 via URETHRAL
  Filled 2020-01-31: qty 10

## 2020-01-31 NOTE — ED Provider Notes (Signed)
[facility] Emergency Department Provider Note  ____________________________________________   First MD Initiated Contact with Patient 01/31/20 0507     (approximate)  I have reviewed the triage vital signs and the nursing notes.   HISTORY  Chief Complaint Urinary Retention    HPI Andrew Callahan is a 86 y.o. male with below list of previous medical conditions and recently diagnosed prostate cancer presents to the emergency department secondary to inability to urinate with onset tonight.  Patient admits to 10 out of 10 suprapubic discomfort.  Patient denies any preceding hematuria or dysuria.        Past Medical History:  Diagnosis Date  . Chronic constipation   . Elevated prostate specific antigen (PSA)   . History of kidney stones   . Hyperlipidemia   . Hypertension   . Hypothyroid   . Hypothyroidism   . Paroxysmal A-fib ([place])   . Sinus bradycardia   . Sleep apnea    uses CPAP  . Stroke [facility])     There are no problems to display for this patient.   Past Surgical History:  Procedure Laterality Date  . BACK SURGERY  1997   L3, 4, 5 laminectomy   . BACK SURGERY  1998   hard wire removed, fusion of L3-5  . BACK SURGERY  2009   laminotomy/foraminotomy w/ decompression of nerve root, facet thermal ablation L2-3.   [place]  2010  . KNEE SURGERY Left 2007   Arthroscopic partial knee replacement, replaced medial side   . MOHS SURGERY Right 2014   squamous cell carcinoma  . NECK SURGERY  2008   fusion with hardwire in place C3 through C7   . pituitary adenoma  1994   benign  . PROSTATE BIOPSY N/A 01/01/2020   Procedure: PROSTATE BIOPSY URO NAV FUSION;  Surgeon: Royston Cowper, MD;  Location: [facility] ORS;  Service: Urology;  Laterality: N/A;  . SPINE SURGERY    . SPINE SURGERY  2016   fusion L4     Prior to Admission medications   Medication Sig Start Date End Date Taking? Authorizing Provider  B Complex-C (B-COMPLEX WITH  VITAMIN C) tablet Take 1 tablet by mouth daily.    [provider]  ELIQUIS 5 MG TABS tablet Take 5 mg by mouth 2 (two) times daily. 11/04/19   [provider]  gabapentin (NEURONTIN) 100 MG capsule Take 200 mg by mouth 2 (two) times daily.  11/27/19   [provider]  Glucosamine-Chondroitin-MSM (GLUCOSAMINE CHONDROIT MSM DS PO) Take 1 tablet by mouth daily.    [provider]  hydrocortisone (CORTEF) 10 MG tablet Take 10 mg by mouth 2 (two) times daily.  10/13/19   [provider]  levofloxacin (LEVAQUIN) 500 MG tablet Take 1 tablet (500 mg total) by mouth daily. 01/01/20   Royston Cowper, MD  levothyroxine (SYNTHROID) 50 MCG tablet Take 50 mcg by mouth daily before breakfast. 11/26/19   [provider]  Multiple Vitamin (MULTIVITAMIN WITH MINERALS) TABS tablet Take 1 tablet by mouth daily. One-A-Day Men's 50+    [provider]  polycarbophil (FIBERCON) 625 MG tablet Take 625 mg by mouth daily.    [provider]  polyethylene glycol (MIRALAX / GLYCOLAX) 17 g packet Take 17 g by mouth daily.    [provider]  Probiotic Product (ALIGN) 4 MG CAPS Take 4 mg by mouth 2 (two) times daily.    [provider]  rosuvastatin (CRESTOR)  20 MG tablet Take 20 mg by mouth daily. 07/17/19   [provider]  Testosterone 12.5 MG/ACT (1%) GEL Apply 2 Pump topically daily. 11/14/19   [provider]  valsartan (DIOVAN) 320 MG tablet Take 320 mg by mouth daily. 11/18/19   [provider]    Allergies Metoprolol and Rivaroxaban  Family History  Problem Relation Age of Onset  . Stroke Mother   . Heart attack Father     Social History Social History   Tobacco Use  . Smoking status: Former Research scientist (life sciences)  . Smokeless tobacco: Never Used  . Tobacco comment: quit 50 years ago  Substance Use Topics  . Alcohol use: Yes    Comment: rarely  . Drug use: Never    Review of Systems Constitutional: No  fever/chills Eyes: No visual changes. ENT: No sore throat. Cardiovascular: Denies chest pain. Respiratory: Denies shortness of breath. Gastrointestinal: No abdominal pain.  No nausea, no vomiting.  No diarrhea.  No constipation. Genitourinary: Negative for dysuria.  Negative for hematuria positive for urinary retention. Musculoskeletal: Negative for neck pain.  Negative for back pain. Integumentary: Negative for rash. Neurological: Negative for headaches, focal weakness or numbness.   ____________________________________________   PHYSICAL EXAM:  VITAL SIGNS: ED Triage Vitals [01/31/20 0452]  Enc Vitals Group     BP      Pulse      Resp      Temp      Temp src      SpO2      Weight 104.3 kg (230 lb)     Height 1.778 m (5\' 10" )     Head Circumference      Peak Flow      Pain Score 10     Pain Loc      Pain Edu?      Excl. in [place]?     Constitutional: Alert and oriented.  Eyes: Conjunctivae are normal.  Mouth/Throat: Patient is wearing a mask. Neck: No stridor.  No meningeal signs.   Cardiovascular: Normal rate, regular rhythm. Good peripheral circulation. Grossly normal heart sounds. Respiratory: Normal respiratory effort.  No retractions. Gastrointestinal: Suprapubic tenderness to palpation.. No distention.  Genitourinary: No gross abnormality of the penis or external urethra. Musculoskeletal: No lower extremity tenderness nor edema. No gross deformities of extremities. Neurologic:  Normal speech and language. No gross focal neurologic deficits are appreciated.  Skin:  Skin is warm, dry and intact. Psychiatric: Mood and affect are normal. Speech and behavior are normal.  ____________________________________________   LABS (all labs ordered are listed, but only abnormal results are displayed)  Labs Reviewed  URINALYSIS, COMPLETE (UACMP) WITH MICROSCOPIC      Procedures   ____________________________________________   INITIAL IMPRESSION / MDM / [place] / ED COURSE  As part of my medical decision making, I reviewed the following data within the electronic MEDICAL RECORD NUMBER   86 year old male presented with above-stated history and physical exam secondary to urinary retention.  I placed a Foley catheter under sterile technique without difficulty clear urine noted 550 mL of urine currently noted.  Patient fitted with a leg bag with recommendation to follow-up with Dr. Yves Dill urology      ____________________________________________  FINAL CLINICAL IMPRESSION(S) / ED DIAGNOSES  Final diagnoses:  Urinary retention     MEDICATIONS GIVEN DURING THIS VISIT:  Medications  lidocaine (XYLOCAINE) 2 % jelly 1 application (1 application Urethral Given 01/31/20 0502)     ED Discharge Orders  None      *Please note:  Andrew Callahan was evaluated in Emergency Department on 01/31/2020 for the symptoms described in the history of present illness. He was evaluated in the context of the global COVID-19 pandemic, which necessitated consideration that the patient might be at risk for infection with the SARS-CoV-2 virus that causes COVID-19. Institutional protocols and algorithms that pertain to the evaluation of patients at risk for COVID-19 are in a state of rapid change based on information released by regulatory bodies including the CDC and federal and state organizations. These policies and algorithms were followed during the patient's care in the ED.  Some ED evaluations and interventions may be delayed as a result of limited staffing during the pandemic.*  Note:  This document was prepared using Dragon voice recognition software and may include unintentional dictation errors.   Gregor Hams, MD 01/31/20 [phone]

## 2020-01-31 NOTE — ED Notes (Signed)
Bladder scan showing 512 ml

## 2020-01-31 NOTE — ED Triage Notes (Signed)
Pt arrives ambulatory to triage with c/o urinary retention tonight. Pt has stage 4 prostate cancer.

## 2020-02-04 LAB — POCT I-STAT CREATININE: Creatinine, Ser: 0.8 mg/dL (ref 0.61–1.24)

## 2020-02-05 ENCOUNTER — Encounter: Payer: Medicare Other | Admitting: Physical Therapy

## 2020-02-09 ENCOUNTER — Telehealth: Payer: Self-pay | Admitting: Urology

## 2020-02-09 NOTE — Telephone Encounter (Signed)
Pt called office and appt was made

## 2020-02-09 NOTE — Telephone Encounter (Signed)
-----   Message from Billey Co, MD sent at 02/05/2020  6:42 PM EST ----- Regarding: 2nd opinion Sounds like this patient is looking for a 2nd opinion on his prostate cancer/urinary retention. His cardiologist called me this evening.   Sounds like he will reach out to [place], I am happy to see him next week, ok to overbook.'   [name], MD 02/05/2020

## 2020-02-12 ENCOUNTER — Other Ambulatory Visit: Payer: Self-pay

## 2020-02-12 ENCOUNTER — Encounter: Payer: Self-pay | Admitting: Urology

## 2020-02-12 ENCOUNTER — Encounter: Payer: Medicare Other | Admitting: Physical Therapy

## 2020-02-12 ENCOUNTER — Ambulatory Visit (INDEPENDENT_AMBULATORY_CARE_PROVIDER_SITE_OTHER): Payer: Medicare Other | Admitting: Urology

## 2020-02-12 VITALS — BP 146/76 | HR 53 | Ht 70.0 in | Wt 233.8 lb

## 2020-02-12 DIAGNOSIS — C61 Malignant neoplasm of prostate: Secondary | ICD-10-CM

## 2020-02-12 NOTE — Progress Notes (Signed)
02/12/20 5:54 PM   Andrew Callahan 19-Jun-1934 [id number]  CC: Prostate cancer  HPI: I saw Andrew Callahan in urology clinic today for second opinion on metastatic prostate cancer.  To briefly summarize, he is an 86 year old male with past medical history notable for A. fib on Eliquis, sleep apnea on CPAP, low testosterone on testosterone replacement more than 15 years, and stroke in July 2020 who was recently diagnosed with metastatic prostate cancer with extensive asymptomatic bony mets.  He was previously followed at [facility] by a urologist who was following his PSA and urinary symptoms.  His PSA was relatively stable ranging from 6.3 in 2015 to 6.7 in May 2020.  He reportedly was told he had an abnormal DRE, but he never underwent biopsy.  He had mild urinary symptoms at that time of weak stream and feeling of incomplete emptying.  He was then seen by Dr. Yves Dill here in [place] in December 2020 with a PSA of 8.6 and a prostate MRI was ordered which showed a 70 g prostate with a 3 cm PI-RADS 5 lesion involving the left side of the prostate.  Pathology showed extensive high risk disease including Gleason score 4+5 = 9 involving greater than 95% of the core.  There was acinar adenocarcinoma as well as intraductal carcinoma.  Staging with CT and bone scan showed diffuse osseous metastatic disease.  Dr. Eliberto Ivory was planning to start ADT with Mills Koller but he has not yet received his dose, and is scheduled for tomorrow, as well as possible [place]le[place].   He also recently developed urinary retention on 01/31/2020 when he presented to the ED with inability to urinate and bladder scan over 500 mL.  A catheter was placed.  He passed a voiding trial 1 week later after starting Flomax and dutasteride.  He currently is urinating well during the day with his only complaint of nocturia 2-3 times per night.  He denies any back pain or bony pain, and overall feels well.  PMH: Past Medical History:    Diagnosis Date  . Chronic constipation   . Elevated prostate specific antigen (PSA)   . History of kidney stones   . Hyperlipidemia   . Hypertension   . Hypothyroid   . Hypothyroidism   . Paroxysmal A-fib ([place])   . Sinus bradycardia   . Sleep apnea    uses CPAP  . Stroke [facility])     Surgical History: Past Surgical History:  Procedure Laterality Date  . BACK SURGERY  1997   L3, 4, 5 laminectomy   . BACK SURGERY  1998   hard wire removed, fusion of L3-5  . BACK SURGERY  2009   laminotomy/foraminotomy w/ decompression of nerve root, facet thermal ablation L2-3.   Marland Kitchen [place]  2010  . KNEE SURGERY Left 2007   Arthroscopic partial knee replacement, replaced medial side   . MOHS SURGERY Right 2014   squamous cell carcinoma  . NECK SURGERY  2008   fusion with hardwire in place C3 through C7   . pituitary adenoma  1994   benign  . PROSTATE BIOPSY N/A 01/01/2020   Procedure: PROSTATE BIOPSY URO NAV FUSION;  Surgeon: Royston Cowper, MD;  Location: [facility] ORS;  Service: Urology;  Laterality: N/A;  . SPINE SURGERY    . SPINE SURGERY  2016   fusion L4     Family History: Family History  Problem Relation Age of Onset  . Stroke Mother   . Heart  attack Father     Social History:  reports that he has quit smoking. He has never used smokeless tobacco. He reports current alcohol use. He reports that he does not use drugs.  Physical Exam: BP (!) 146/76 (BP Location: Left Arm, Patient Position: Sitting, Cuff Size: Large)   Pulse (!) 53   Ht 5\' 10"  (1.778 m)   Wt 233 lb 12.8 oz (106.1 kg)   BMI 33.55 kg/m    Constitutional:  Alert and oriented, No acute distress. Cardiovascular: No clubbing, cyanosis, or edema. Respiratory: Normal respiratory effort, no increased work of breathing. GI: Abdomen is soft, nontender, nondistended, no abdominal masses Lymph: No cervical or inguinal lymphadenopathy. Skin: No rashes, bruises or suspicious lesions. Neurologic: Grossly  intact, no focal deficits, moving all 4 extremities. Psychiatric: Normal mood and affect.  Laboratory Data: Reviewed  Pertinent Imaging: Reviewed, see HPI  Assessment & Plan:   In summary, he is an 86 year old male with new diagnosis of metastatic prostate cancer with intraductal carcinoma seen on prostate biopsy with extensive osseous metastatic disease that is asymptomatic at this time.  Regarding his urinary symptoms, he currently is voiding well aside from some nocturia 2-3 times per night since starting Flomax and dutasteride.  I had a very long conversation with the patient about the new diagnosis of metastatic prostate cancer.  I do think he would benefit significantly from a tumor board discussion and an oncology consult regarding his mixed adenocarcinoma and intraductal carcinoma.  We discussed the role of ADT and other antiandrogens as well as potentially radiation in the future for any bone pain.  Fortunately, he is urinary symptoms will likely continue to improve once starting ADT as this will shrink the prostate.  If he continues to have bothersome urinary symptoms or retention, could consider HoLEP or TURP in the future.   Agree with Mills Koller to start tomorrow Referral placed to medical oncology Follow-up in 1 month for PVR and to check on urinary symptoms, anticipate continuing ADT at that visit  I spent 45 total minutes on the day of the encounter including pre-visit review of the medical record, face-to-face time with the patient, and post visit ordering of labs/imaging/tests.  [name], MD 02/12/2020  [facility] [address] [place], [place] [address] [phone]

## 2020-02-12 NOTE — Patient Instructions (Signed)
Hormone Suppression Therapy for Prostate Cancer    Hormone suppression therapy is a treatment that can help to slow the growth of cancer cells in the prostate (prostate gland). It is also called androgen deprivation therapy (ADT). Hormone suppression therapy targets hormones in the body called androgens that may help cancer cells grow. This therapy reduces the amount of these hormones in the body or keeps the body from using them.  Hormone suppression therapy alone will not cure prostate cancer, but it can slow the growth of prostate cancer and may shrink tumors over time. Your health care provider can help you find the most effective way to treat your type of prostate cancer and best fit your lifestyle.  Types of hormone suppression therapy  Orchiectomy  Orchiectomy, also called surgical castration, is a surgery to remove one or both testicles. The testicles are where the two main androgens (testosterone and dihydrotestosterone) are made.  Medicine therapy  Medicine therapy, also called chemical castration, involves taking medicines to keep your body from making or using androgens. If you choose this therapy, you may take any of these medicines:   Luteinizing hormone-releasing hormone (LHRH) agonist medicines. These medicines are injected or implanted under your skin to lower the amount of androgens that your testicles make. If you take these medicines, you may also be prescribed other medicines to help with side effects. Common LHRH agonist medicines include:  ? Leuprolide.  ? Goserelin.  ? Histrelin.  ? Triptorelin.   LHRH antagonist medicines. These medicines are like LHRH agonist medicines but they work faster. They are commonly used to prevent side effects when prostate cancer is in an advanced stage. A common LHRH antagonist medicine is degarelix.   CYP17 inhibitor medicines. These medicines help to stop other glands from making androgens. They may be used if the prostate cancer is advanced and has not  gotten better with surgery or other medicine treatment. A steroid medicine may be given with this type of medicine to help with side effects. A common CYP17 inhibitor medicine is abiraterone.    Anti-androgen medicines  Anti-androgen medicines block areas on the body where androgens attach. This prevents the androgens from coming into contact with cancer cells and fueling their growth. These medicines include:   Flutamide.   Bicalutamide.   Enzalutamide.   Nilutamide.  Androgen-suppressing medicines  Androgen-suppressing medicines may be used if other hormone suppression treatments are not working. They are not commonly used, however, because of their side effects. These medicines include:   Estrogen.   Ketoconazole.  What are the risks?  Hormone suppression therapy may cause side effects, including:   Hot flashes.   A decrease or lack of sexual desire.   Erectile dysfunction (impotence).   A decrease in the size of the penis or testicles.   Breast tenderness.   An increase in breast size.   Fatigue.   Weight gain.   Thinning of the bones (osteoporosis).   Anemia.   Loss of muscle.   Depression.   Increased cholesterol levels.   Trouble with thinking or focusing.   Stomach upset and nausea.   Diarrhea.  Hormone suppression therapy can also increase your risk of these problems:   High blood pressure (hypertension).   Stroke.   Diabetes.   Heart attack.   Heart disease.  What are the benefits?  One of the main benefits of hormone suppression therapy is having additional treatment options. You may have only one type of treatment or two or more types   side effects that do not get better  with treatment.  You have trouble urinating.  You have new side effects that do not go away. Get help right away if:  You have severe chest pain.  You have trouble breathing.  You have an irregular heartbeat.  You have numbness or paralysis in the lower half of your body.  You are confused.  You have trouble talking or understanding. These symptoms may be an emergency. Do not wait to see if the symptoms will go away. Get medical help right away. Call your local emergency services (911 in the [place] Do not drive yourself to the hospital. Summary  Hormone suppression therapy is a treatment that can help to slow the growth of cancer cells in the prostate (prostate gland).  Hormone suppression therapy alone will not cure prostate cancer, but it can slow the growth of prostate cancer and may shrink tumors over time.  Treatment to suppress hormones may include surgery or medicines. This information is not intended to replace advice given to you by your health care provider. Make sure you discuss any questions you have with your health care provider. Document Revised: 12/08/2016 Document Reviewed: 11/01/2016 Elsevier Patient Education  [name]. Degarelix injection What is this medicine? DEGARELIX (deg a REL ix) is used to treat men with advanced prostate cancer. This medicine may be used for other purposes; ask your health care provider or pharmacist if you have questions. COMMON BRAND NAME(S): Degarelix, [employer] What should I tell my health care provider before I take this medicine? They need to know if you have any of these conditions:  diabetes  heart disease  kidney disease  liver disease  low levels of potassium or magnesium in the blood  osteoporosis  an unusual or allergic reaction to degarelix, mannitol, other medicines, foods, dyes, or preservatives  pregnant or trying to get pregnant  breast-feeding How should I use this medicine? This medicine is  for injection under the skin. It is usually given by a health care professional in a hospital or clinic setting. If you get this medicine at home, you will be taught how to prepare and give this medicine. Use exactly as directed. Take your medicine at regular intervals. Do not take it more often than directed. It is important that you put your used needles and syringes in a special sharps container. Do not put them in a trash can. If you do not have a sharps container, call your pharmacist or healthcare provider to get one. Talk to your pediatrician regarding the use of this medicine in children. Special care may be needed. Overdosage: If you think you have taken too much of this medicine contact a poison control center or emergency room at once. NOTE: This medicine is only for you. Do not share this medicine with others. What if I miss a dose? Try not to miss a dose. If you do miss a dose, call your doctor or health care professional for advice. What may interact with this medicine? Do not take this medicine with any of the following medications:  amiodarone  bretylium  disopyramide  droperidol  ibutilide  procainamide  quinidine  sotalol This medicine may also interact with the following medications:  dofetilide This list may not describe all possible interactions. Give your health care provider a list of all the medicines, herbs, non-prescription drugs, or dietary supplements you use. Also tell them if you smoke, drink alcohol, or use illegal drugs. Some items may interact with  your medicine. What should I watch for while using this medicine? Visit your doctor or health care professional for regular checks on your progress and discuss any issues before you start taking this medicine. Do not rub or scratch injection site. There may be a lump at the injection site, or it may be red or sore for a few days after your dose. Your doctor or health care professional will need to monitor  your hormone levels in your blood to check your response to treatment. Try to keep any appointments for testing. What side effects may I notice from receiving this medicine? Side effects that you should report to your doctor or health care professional as soon as possible:  allergic reactions like skin rash, itching or hives, swelling of the face, lips, or tongue  fever or chills  irregular heartbeat  nausea and vomiting along with severe abdominal pain  pain or difficulty passing urine  pelvic pain or bloating  signs and symptoms of high blood sugar such as being more thirsty or hungry or having to urinate more than normal. You may also feel very tired or have blurry vision Side effects that usually do not require medical attention (report to your doctor or health care professional if they continue or are bothersome):  change in sex drive or performance  constipation  headache  high blood pressure  hot flashes (flushing of skin, increased sweating)  itching, redness or mild pain at site where injected  joint pain  trouble sleeping  unusually weak or tired  weight gain This list may not describe all possible side effects. Call your doctor for medical advice about side effects. You may report side effects to FDA at 1-800-FDA-1088. Where should I keep my medicine? Keep out of the reach of children. This drug is usually given in a hospital or clinic and will not be stored at home. In rare cases, this medicine may be given at home. If you are using this medicine at home, you will be instructed on how to store this medicine. Throw away any unused medicine after the expiration date on the label. NOTE: This sheet is a summary. It may not cover all possible information. If you have questions about this medicine, talk to your doctor, pharmacist, or health care provider.  2020 Elsevier/Gold Standard (2019-02-10 12:20:52) Prostate Cancer  The prostate is a walnut-sized gland that  is involved in the production of semen. It is located below a man's bladder, in front of the rectum. Prostate cancer is the abnormal growth of cells in the prostate gland. What are the causes? The exact cause of this condition is not known. What increases the risk? This condition is more likely to develop in men who:  Are older than age 65.  Are African-American.  Are obese.  Have a family history of prostate cancer.  Have a family history of breast cancer. What are the signs or symptoms? Symptoms of this condition include:  A need to urinate often.  Weak or interrupted flow of urine.  Trouble starting or stopping urination.  Inability to urinate.  Pain or burning during urination.  Painful ejaculation.  Blood in urine or semen.  Persistent pain or discomfort in the lower back, lower abdomen, hips, or upper thighs.  Trouble getting an erection.  Trouble emptying the bladder all the way. How is this diagnosed? This condition can be diagnosed with:  A digital rectal exam. For this exam, a health care provider inserts a gloved finger into the  rectum to feel the prostate gland.  A blood test called a prostate-specific antigen (PSA) test.  An imaging test called transrectal ultrasonography.  A procedure in which a sample of tissue is taken from the prostate and examined under a microscope (prostate biopsy). Once the condition is diagnosed, tests will be done to determine how far the cancer has spread. This is called staging the cancer. Staging may involve imaging tests, such as:  A bone scan.  A CT scan.  A PET scan.  An MRI. The stages of prostate cancer are as follows:  Stage I. At this stage, the cancer is found in the prostate only. The cancer is not visible on imaging tests and it is usually found by accident, such as during a prostate surgery.  Stage II. At this stage, the cancer is more advanced than it is in stage I, but the cancer has not spread outside  the prostate.  Stage III. At this stage, the cancer has spread beyond the outer layer of the prostate to nearby tissues. The cancer may be found in the seminal vesicles, which are near the bladder and the prostate.  Stage IV. At this stage, the cancer has spread other parts of the body, such as the lymph nodes, bones, bladder, rectum, liver, or lungs. How is this treated? Treatment for this condition depends on several factors, including the stage of the cancer, your age, personal preferences, and your overall health. Talk with your health care provider about treatment options that are recommended for you. Common treatments include:  Observation for early stage prostate cancer (active surveillance). This involves having exams, blood tests, and in some cases, more biopsies. For some men, this is the only treatment needed.  Surgery. Types of surgeries include: ? Open surgery. In this surgery, a larger incision is made to remove the prostate. ? A laparoscopic prostatectomy. This is a surgery to remove the prostate and lymph nodes through several, small incisions. It is often referred to as a minimally invasive surgery. ? A robotic prostatectomy. This is a surgery to remove the prostate and lymph nodes with the help of a robotic arm that is controlled by a computer. ? Orchiectomy. This is a surgery to remove the testicles. ? Cryosurgery. This is a surgery to freeze and destroy cancer cells.  Radiation treatment. Types of radiation treatment include: ? External beam radiation. This type aims beams of radiation from outside the body at the prostate to destroy cancerous cells. ? Brachytherapy. This type uses radioactive needles, seeds, wires, or tubes that are implanted into the prostate gland. Like external beam radiation, brachytherapy destroys cancerous cells. An advantage is that this type of radiation limits the damage to surrounding tissue and has fewer side effects.  High-intensity, focused  ultrasonography. This treatment destroys cancer cells by delivering high-energy ultrasound waves to the cancerous cells.  Chemotherapy medicines. This treatment kills cancer cells or stops them from multiplying.  Hormone treatment. This treatment involves taking medicines that act on one of the male hormones (testosterone): ? By stopping your body from producing testosterone. ? By blocking testosterone from reaching cancer cells. Follow these instructions at home:  Take over-the-counter and prescription medicines only as told by your health care provider.  Maintain a healthy diet.  Get plenty of sleep.  Consider joining a support group for men who have prostate cancer. Meeting with a support group may help you learn to cope with the stress of having cancer.  Keep all follow-up visits as told by  your health care provider. This is important.  If you have to go to the hospital, notify your cancer specialist (oncologist).  Treatment for prostate cancer may affect sexual function. Continue to have intimate moments with your partner. This may include touching, holding, hugging, and caressing. Contact a health care provider if:  You have trouble urinating.  You have blood in your urine.  You have pain in your hips, back, or chest. Get help right away if:  You have weakness or numbness in your legs.  You cannot control urination or your bowel movements (incontinence).  You have trouble breathing.  You have sudden chest pain.  You have chills or a fever. Summary  The prostate is a walnut-sized gland that is involved in the production of semen. It is located below a man's bladder, in front of the rectum. Prostate cancer is the abnormal growth of cells in the prostate gland.  Treatment for this condition depends on several factors, including the stage of the cancer, your age, personal preferences, and your overall health. Talk with your health care provider about treatment options  that are recommended for you.  Consider joining a support group for men who have prostate cancer. Meeting with a support group may help you learn to cope with the stress of having cancer. This information is not intended to replace advice given to you by your health care provider. Make sure you discuss any questions you have with your health care provider. Document Revised: 11/09/2017 Document Reviewed: 08/07/2016 Elsevier Patient Education  2020 [employer].

## 2020-02-15 DIAGNOSIS — C61 Malignant neoplasm of prostate: Secondary | ICD-10-CM | POA: Insufficient documentation

## 2020-02-15 NOTE — Progress Notes (Signed)
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  INTERVAL HISTORY: Patient is an 86 year old male who was recently diagnosed with widespread prostate cancer who was referred to clinic for evaluation and treatment.  He was initially noted to have an elevated PSA of greater than 8 and subsequent biopsies and nuclear med bone scan confirmed diagnosis and stage of disease.  He currently feels well and is asymptomatic.  He has no neurologic complaints.  He denies any recent fevers or illnesses.  He has a good appetite and denies weight loss.  He has no chest pain, shortness of breath, cough, or hemoptysis.  He denies any nausea, vomiting, constipation, or diarrhea.  He has nocturia and frequency, but no other urinary complaints.  Patient offers no further specific complaints today.  REVIEW OF SYSTEMS:   Review of Systems  Constitutional: Negative.  Negative for fever, malaise/fatigue and weight loss.  Respiratory: Negative.  Negative for cough and hemoptysis.   Cardiovascular: Negative.  Negative for chest pain and leg swelling.  Gastrointestinal: Negative.  Negative for abdominal pain.  Genitourinary: Positive for frequency.  Musculoskeletal: Negative.  Negative for back pain.  Skin: Negative.  Negative for rash.  Neurological: Negative.  Negative for dizziness, focal weakness, weakness and headaches.  Psychiatric/Behavioral: Negative.  The patient is not nervous/anxious.     As per HPI. Otherwise, a complete review of systems is negative.  PAST MEDICAL HISTORY: Past Medical History:  Diagnosis Date  . Chronic constipation   . Elevated prostate specific antigen (PSA)   . History of kidney stones   . Hyperlipidemia   .  Hypertension   . Hypothyroid   . Hypothyroidism   . Paroxysmal A-fib ([place])   . Sinus bradycardia   . Sleep apnea    uses CPAP  . Stroke [facility])     PAST SURGICAL HISTORY: Past Surgical History:  Procedure Laterality Date  . BACK SURGERY  1997   L3, 4, 5 laminectomy   . BACK SURGERY  1998   hard wire removed, fusion of L3-5  . BACK SURGERY  2009   laminotomy/foraminotomy w/ decompression of nerve root, facet thermal ablation L2-3.   Marland Kitchen [place]  2010  . KNEE SURGERY Left 2007   Arthroscopic partial knee replacement, replaced medial side   . MOHS SURGERY Right 2014   squamous cell carcinoma  . NECK SURGERY  2008   fusion with hardwire in place C3 through C7   . pituitary adenoma  1994   benign  . PROSTATE BIOPSY N/A 01/01/2020   Procedure: PROSTATE BIOPSY URO NAV FUSION;  Surgeon: Royston Cowper, MD;  Location: [facility] ORS;  Service: Urology;  Laterality: N/A;  . SPINE SURGERY    . SPINE SURGERY  2016   fusion L4     FAMILY HISTORY: Family History  Problem Relation Age of Onset  . Stroke Mother   . Heart attack Father   . Leukemia Sister   . Lung cancer Brother     ADVANCED DIRECTIVES (Y/N):  N  HEALTH MAINTENANCE: Social History   Tobacco Use  . Smoking status: Former Research scientist (life sciences)  . Smokeless tobacco: Never Used  . Tobacco comment: quit 50 years ago  Substance Use Topics  . Alcohol use: Yes    Comment:  rarely  . Drug use: Never     Colonoscopy:  PAP:  Bone density:  Lipid panel:  Allergies  Allergen Reactions  . Metoprolol Other (See Comments)    dizziness   . Rivaroxaban Other (See Comments)    hematuria     Current Outpatient Medications  Medication Sig Dispense Refill  . degarelix (FIRMAGON, 240 MG DOSE,) 120 mg injection Inject 120 mg as directed as directed    . dutasteride (AVODART) 0.5 MG capsule Take 0.5 mg by mouth daily.    Marland Kitchen ELIQUIS 5 MG TABS tablet Take 5 mg by mouth 2 (two) times daily.    Marland Kitchen gabapentin (NEURONTIN) 100 MG  capsule Take 200 mg by mouth 2 (two) times daily.     . hydrocortisone (CORTEF) 10 MG tablet Take 10 mg by mouth 2 (two) times daily.     Marland Kitchen levothyroxine (SYNTHROID) 50 MCG tablet Take 50 mcg by mouth daily before breakfast.    . Misc Natural Products (OSTEO BI-FLEX ADV JOINT SHIELD PO) Take by mouth.    . Multiple Vitamin (MULTIVITAMIN WITH MINERALS) TABS tablet Take 1 tablet by mouth daily. One-A-Day Men's 38+    . polycarbophil (FIBERCON) 625 MG tablet Take 625 mg by mouth daily.    . polyethylene glycol (MIRALAX / GLYCOLAX) 17 g packet Take 17 g by mouth daily.    . Probiotic Product (ALIGN) 4 MG CAPS Take 4 mg by mouth 2 (two) times daily.    . rosuvastatin (CRESTOR) 20 MG tablet Take 20 mg by mouth daily.    . tamsulosin (FLOMAX) 0.4 MG CAPS capsule Take 0.4 mg by mouth daily.    . valsartan (DIOVAN) 320 MG tablet Take 320 mg by mouth daily.    . B Complex-C (SUPER B COMPLEX PO) Take by mouth daily.     No current facility-administered medications for this visit.    OBJECTIVE: Vitals:   02/16/20 1345  BP: (!) 161/75  Pulse: (!) 51  Resp: 18  Temp: (!) 95.8 F (35.4 C)  SpO2: 98%     Body mass index is 33.99 kg/m.    ECOG FS:0 - Asymptomatic  General: Well-developed, well-nourished, no acute distress. Eyes: Pink conjunctiva, anicteric sclera. HEENT: Normocephalic, moist mucous membranes. Lungs: No audible wheezing or coughing. Heart: Regular rate and rhythm. Abdomen: Soft, nontender, no obvious distention. Musculoskeletal: No edema, cyanosis, or clubbing. Neuro: Alert, answering all questions appropriately. Cranial nerves grossly intact. Skin: No rashes or petechiae noted. Psych: Normal affect. Lymphatics: No cervical, calvicular, axillary or inguinal LAD.   LAB RESULTS:  Lab Results  Component Value Date   CREATININE 0.80 01/28/2020    No results found for: WBC, NEUTROABS, HGB, HCT, MCV, PLT   STUDIES: CT ABDOMEN PELVIS W WO CONTRAST  Result Date:  01/28/2020 CLINICAL DATA:  Prostate cancer.  Elevated PSA. EXAM: CT ABDOMEN AND PELVIS WITHOUT AND WITH CONTRAST TECHNIQUE: Multidetector CT imaging of the abdomen and pelvis was performed following the standard protocol before and following the bolus administration of intravenous contrast. CONTRAST:  133mL OMNIPAQUE IOHEXOL 300 MG/ML  SOLN COMPARISON:  MR prostate 11/20/2019. FINDINGS: Lower chest: 4 mm subpleural left lower lobe nodule (4/14), nonspecific and possibly a subpleural lymph node. Lung bases are otherwise clear. Heart size normal. Coronary artery calcification. No pericardial or pleural effusion. Distal esophagus is unremarkable. Hepatobiliary: Liver is unremarkable. A stone is seen in the gallbladder. No biliary ductal dilatation. Pancreas: Negative. Spleen: Subcentimeter low-attenuation lesions in the spleen are too small to  characterize. Adrenals/Urinary Tract: Adrenal glands and kidneys are unremarkable. There may be small renal sinus cysts on the left. Ureters are decompressed. Bladder is grossly unremarkable. Stomach/Bowel: Stomach, small bowel, appendix and colon are unremarkable. Vascular/Lymphatic: Atherosclerotic calcification of the aorta without aneurysm. Scattered lymph nodes measure up to 8 mm in the left external iliac station (7/84) and are not considered enlarged by CT size criteria. Reproductive: Prostate is mildly enlarged. Other: Small bilateral inguinal hernias contain fat. No free fluid. Mesenteries and peritoneum are unremarkable. Musculoskeletal: Rounded sclerotic lesions are seen in T11, L1 and L5. Denser sclerotic lesions are seen in L4, medial right iliac wing and left acetabular roof IMPRESSION: 1. Osseous metastatic disease involving T11, L1 and L5. Additional sclerotic lesions in L4, the medial right iliac wing and left acetabular roof are indeterminate. 2. Mild prostate enlargement. 3. 4 mm subpleural left lower lobe nodule, nonspecific and likely a subpleural lymph node.  Continued attention on follow-up is recommended. 4. Cholelithiasis. 5. Aortic atherosclerosis (ICD10-I70.0). Coronary artery calcification. Electronically Signed   By: Lorin Picket M.D.   On: 01/28/2020 10:21   NM Bone Scan Whole Body  Result Date: 01/29/2020 CLINICAL DATA:  Prostate cancer. EXAM: NUCLEAR MEDICINE WHOLE BODY BONE SCAN TECHNIQUE: Whole body anterior and posterior images were obtained approximately 3 hours after intravenous injection of radiopharmaceutical. RADIOPHARMACEUTICALS:  23.3 mCi Technetium-84m MDP IV COMPARISON:  CT 01/28/2020.  MRI prostate 11/20/2019. FINDINGS: Bilateral renal function and excretion. Focal areas of increased activity noted over the right maxilla. This could be related to sinus disease. Metastatic disease cannot be excluded. Focal it areas of increased activity noted about the left first and third ribs anteriorly. Focal areas of increased activity noted about the lower thoracic and multiple areas throughout the lumbar spine. Focal area of increased activity noted about the sacrum. Focal area of increased activity noted about the left superior pubic ramus/acetabulum. These findings are most likely secondary to metastatic disease. Activity noted over both shoulders and knees most likely degenerative. IMPRESSION: 1.  Findings consistent with widespread bony metastatic disease. 2. Focal area of increased activity about the right maxilla. This could be secondary to sinus disease. Metastatic disease cannot be excluded. 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  PLAN:    1. 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  Patient's most recent PSA was greater than 8.0.  He received his loading dose of Firmagon on February 13, 2020 but has expressed a desire to transition his care  to the cancer center.  Patient will also benefit from monthly Xgeva along with 240mg  apalutamide daily in addition to his androgen deprivation, but will initiate these treatments at next clinic visit.  Return to clinic on March 12, 2020 to receive his next treatment of Firmagon and initiation of Zometa and apalutamide. 2.  Bony metastasis: Xgeva as above.  I spent a total of 60 minutes reviewing chart data, face-to-face evaluation with the patient, counseling and coordination of care as detailed above.  Patient expressed understanding and was in agreement with this plan. He also understands that He can call clinic at any time with any questions, concerns, or complaints.   Cancer Staging Prostate cancer [facility]) Staging form: Prostate, AJCC 8th Edition - Clinical stage from 02/17/2020: Stage IVB (cT2c, cN0, cM1b, PSA: 8.6, Grade Group: 5) - Signed by Delight Hoh  J, MD on 02/17/2020   Lloyd Huger, MD   02/17/2020 10:00 AM

## 2020-02-16 ENCOUNTER — Other Ambulatory Visit: Payer: Self-pay

## 2020-02-16 ENCOUNTER — Encounter: Payer: Self-pay | Admitting: Oncology

## 2020-02-16 ENCOUNTER — Inpatient Hospital Stay: Payer: Medicare Other | Attending: Oncology | Admitting: Oncology

## 2020-02-16 DIAGNOSIS — Z87891 Personal history of nicotine dependence: Secondary | ICD-10-CM | POA: Insufficient documentation

## 2020-02-16 DIAGNOSIS — C7951 Secondary malignant neoplasm of bone: Secondary | ICD-10-CM | POA: Diagnosis not present

## 2020-02-16 DIAGNOSIS — Z801 Family history of malignant neoplasm of trachea, bronchus and lung: Secondary | ICD-10-CM | POA: Diagnosis not present

## 2020-02-16 DIAGNOSIS — E291 Testicular hypofunction: Secondary | ICD-10-CM | POA: Insufficient documentation

## 2020-02-16 DIAGNOSIS — Z7189 Other specified counseling: Secondary | ICD-10-CM

## 2020-02-16 DIAGNOSIS — C61 Malignant neoplasm of prostate: Secondary | ICD-10-CM | POA: Diagnosis present

## 2020-02-16 DIAGNOSIS — Z806 Family history of leukemia: Secondary | ICD-10-CM | POA: Insufficient documentation

## 2020-02-16 NOTE — Progress Notes (Signed)
Patient is here today for new patient evaluation they have a question about firmagon. Patient has question about wanting prostate removed

## 2020-02-17 DIAGNOSIS — Z7189 Other specified counseling: Secondary | ICD-10-CM | POA: Insufficient documentation

## 2020-02-19 ENCOUNTER — Encounter: Payer: Medicare Other | Admitting: Physical Therapy

## 2020-02-19 ENCOUNTER — Other Ambulatory Visit: Payer: Self-pay | Admitting: *Deleted

## 2020-02-19 ENCOUNTER — Other Ambulatory Visit: Payer: Medicare Other

## 2020-02-19 DIAGNOSIS — C61 Malignant neoplasm of prostate: Secondary | ICD-10-CM

## 2020-02-19 NOTE — Progress Notes (Signed)
Tumor Board Documentation  Andrew Callahan was presented by Dr Grayland Ormond at our Tumor Board on 02/19/2020, which included representatives from medical oncology, radiation oncology, navigation, pathology, radiology, surgical, surgical oncology, palliative care, research.  Andrew Callahan currently presents as a new patient, for new positive pathology with history of the following treatments: surgical intervention(s)(Firmagon).  Additionally, we reviewed previous medical and familial history, history of present illness, and recent lab results along with all available histopathologic and imaging studies. The tumor board considered available treatment options and made the following recommendations: Immunotherapy [place] Koller and Delton See  The following procedures/referrals were also placed: No orders of the defined types were placed in this encounter.   Clinical Trial Status: not discussed   Staging used: AJCC Stage Group  AJCC Staging:       Group: Stage 4B Prostate cancer   National site-specific guidelines NCCN were discussed with respect to the case.  Tumor board is a meeting of clinicians from various specialty areas who evaluate and discuss patients for whom a multidisciplinary approach is being considered. Final determinations in the plan of care are those of the provider(s). The responsibility for follow up of recommendations given during tumor board is that of the provider.   Today's extended care, comprehensive team conference, Andrew Callahan was not present for the discussion and was not examined.   Multidisciplinary Tumor Board is a multidisciplinary case peer review process.  Decisions discussed in the Multidisciplinary Tumor Board reflect the opinions of the specialists present at the conference without having examined the patient.  Ultimately, treatment and diagnostic decisions rest with the primary provider(s) and the patient.

## 2020-02-26 ENCOUNTER — Encounter: Payer: Medicare Other | Admitting: Physical Therapy

## 2020-03-04 ENCOUNTER — Encounter: Payer: Medicare Other | Admitting: Physical Therapy

## 2020-03-05 ENCOUNTER — Encounter: Payer: Self-pay | Admitting: Oncology

## 2020-03-06 NOTE — Progress Notes (Signed)
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  INTERVAL HISTORY: Patient returns to clinic today for further evaluation and initiation of [place] as well as continuation of [place].  He continues to feel well and remains asymptomatic.  He does not complain of pain today.  He has no neurologic complaints.  He denies any recent fevers or illnesses.  He has a good appetite and denies weight loss.  He has no chest pain, shortness of breath, cough, or hemoptysis.  He denies any nausea, vomiting, constipation, or diarrhea.  He has nocturia and frequency, but no other urinary complaints.  Patient offers no further specific complaints today.  REVIEW OF SYSTEMS:   Review of Systems  Constitutional: Negative.  Negative for fever, malaise/fatigue and weight loss.  Respiratory: Negative.  Negative for cough and hemoptysis.   Cardiovascular: Negative.  Negative for chest pain and leg swelling.  Gastrointestinal: Negative.  Negative for abdominal pain.  Genitourinary: Positive for frequency.  Musculoskeletal: Negative.  Negative for back pain.  Skin: Negative.  Negative for rash.  Neurological: Negative.  Negative for dizziness, focal weakness, weakness and headaches.  Psychiatric/Behavioral: Negative.  The patient is not nervous/anxious.     As per HPI. Otherwise, a complete review of systems is negative.  PAST MEDICAL HISTORY: Past Medical History:  Diagnosis Date  . Chronic constipation   . Elevated prostate specific antigen (PSA)   . History of kidney stones   . Hyperlipidemia   . Hypertension   . Hypothyroid   . Hypothyroidism   . Paroxysmal A-fib ([place])   . Sinus bradycardia   . Sleep apnea    uses CPAP  . Stroke  [facility])     PAST SURGICAL HISTORY: Past Surgical History:  Procedure Laterality Date  . BACK SURGERY  1997   L3, 4, 5 laminectomy   . BACK SURGERY  1998   hard wire removed, fusion of L3-5  . BACK SURGERY  2009   laminotomy/foraminotomy w/ decompression of nerve root, facet thermal ablation L2-3.   Marland Kitchen [place]  2010  . KNEE SURGERY Left 2007   Arthroscopic partial knee replacement, replaced medial side   . MOHS SURGERY Right 2014   squamous cell carcinoma  . NECK SURGERY  2008   fusion with hardwire in place C3 through C7   . pituitary adenoma  1994   benign  . PROSTATE BIOPSY N/A 01/01/2020   Procedure: PROSTATE BIOPSY URO NAV FUSION;  Surgeon: Royston Cowper, MD;  Location: [facility] ORS;  Service: Urology;  Laterality: N/A;  . SPINE SURGERY    . SPINE SURGERY  2016   fusion L4     FAMILY HISTORY: Family History  Problem Relation Age of Onset  . Stroke Mother   . Heart attack Father   . Leukemia Sister   . Lung cancer Brother     ADVANCED DIRECTIVES (Y/N):  N  HEALTH MAINTENANCE: Social History   Tobacco Use  . Smoking status: Former Research scientist (life sciences)  . Smokeless tobacco: Never Used  . Tobacco comment: quit 50 years ago  Substance Use Topics  . Alcohol use: Yes    Comment: rarely  . Drug use: Never     Colonoscopy:  PAP:  Bone density:  Lipid panel:  Allergies  Allergen Reactions  . Metoprolol Other (See Comments)    dizziness   . Rivaroxaban Other (See Comments)    hematuria     Current Outpatient Medications  Medication Sig Dispense Refill  . B Complex-C (SUPER B COMPLEX PO) Take by mouth daily.    . degarelix (FIRMAGON, 240 MG DOSE,) 120 mg injection Inject 120 mg as directed as directed    . dutasteride (AVODART) 0.5 MG capsule Take 0.5 mg by mouth daily.    Marland Kitchen ELIQUIS 5 MG TABS tablet Take 5 mg by mouth 2 (two) times daily.    Marland Kitchen gabapentin (NEURONTIN) 100 MG capsule Take 200 mg by mouth 2 (two) times daily.     . hydrocortisone (CORTEF) 10 MG  tablet Take 10 mg by mouth 2 (two) times daily.     Marland Kitchen levothyroxine (SYNTHROID) 50 MCG tablet Take 50 mcg by mouth daily before breakfast.    . Misc Natural Products (OSTEO BI-FLEX ADV JOINT SHIELD PO) Take by mouth.    . Multiple Vitamin (MULTIVITAMIN WITH MINERALS) TABS tablet Take 1 tablet by mouth daily. One-A-Day Men's 74+    . polycarbophil (FIBERCON) 625 MG tablet Take 625 mg by mouth daily.    . polyethylene glycol (MIRALAX / GLYCOLAX) 17 g packet Take 17 g by mouth daily.    . Probiotic Product (ALIGN) 4 MG CAPS Take 4 mg by mouth 2 (two) times daily.    . rosuvastatin (CRESTOR) 20 MG tablet Take 20 mg by mouth daily.    . tamsulosin (FLOMAX) 0.4 MG CAPS capsule Take 0.4 mg by mouth daily.    . valsartan (DIOVAN) 320 MG tablet Take 320 mg by mouth daily.    Marland Kitchen apalutamide (ERLEADA) 60 MG tablet Take 4 tablets (240 mg total) by mouth daily. May be taken with or without food. Swallow tablets whole. 120 tablet 2   No current facility-administered medications for this visit.    OBJECTIVE: Vitals:   03/12/20 1100  BP: (!) 144/61  Pulse: (!) 50  Resp: 20  Temp: 98.2 F (36.8 C)  SpO2: 98%     Body mass index is 34.69 kg/m.    ECOG FS:0 - Asymptomatic  General: Well-developed, well-nourished, no acute distress. Eyes: Pink conjunctiva, anicteric sclera. HEENT: Normocephalic, moist mucous membranes. Lungs: No audible wheezing or coughing. Heart: Regular rate and rhythm. Abdomen: Soft, nontender, no obvious distention. Musculoskeletal: No edema, cyanosis, or clubbing. Neuro: Alert, answering all questions appropriately. Cranial nerves grossly intact. Skin: No rashes or petechiae noted. Psych: Normal affect.  LAB RESULTS:  Lab Results  Component Value Date   NA 140 03/12/2020   K 4.1 03/12/2020   CL 107 03/12/2020   CO2 24 03/12/2020   GLUCOSE 130 (H) 03/12/2020   BUN 18 03/12/2020   CREATININE 0.84 03/12/2020   CALCIUM 9.1 03/12/2020   PROT 7.0 03/12/2020   ALBUMIN  4.2 03/12/2020   AST 36 03/12/2020   ALT 37 03/12/2020   ALKPHOS 59 03/12/2020   BILITOT 0.6 03/12/2020   GFRNONAA >60 03/12/2020   GFRAA >60 03/12/2020    Lab Results  Component Value Date   WBC 7.7 03/12/2020   NEUTROABS 5.5 03/12/2020   HGB 13.8 03/12/2020   HCT 41.3 03/12/2020   MCV 84.3 03/12/2020   PLT 183 03/12/2020     STUDIES: No results found.  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  PLAN:    1. Stage IVB (Gleason 4+5) prostate cancer with widespread bony  metastasis: Pathology and imaging results reviewed independently.  Patient's most recent PSA was greater than 8.0, today's result is pending.  Proceed with his second dose of Firmagon today as well as initiate Xgeva for his bony metastasis.  Patient was also given a prescription for 240 mg Erleada today.  Return to clinic in 4 weeks for repeat laboratory, further evaluation, and continuation of treatment.   2.  Bony metastasis: Xgeva as above. 3.  Urinary frequency: Patient on Flomax already.  Follow-up with urology as scheduled.  He has requested an appointment to see radiation oncology to discuss proton beam therapy.  He expressed understanding that this is not an option at this facility, but would like to further discuss anyway.  He has also requested a referral to interventional radiology to discuss possible cryotherapy to his prostate.  He also expressed understanding that given his metastatic disease interventional radiology would likely not pursue any invasive procedures, but he wished to further discuss with them anyway.  Both referrals have been sent.  I spent a total of 30 minutes reviewing chart data, face-to-face evaluation with the patient, counseling and coordination of care as detailed above.   Patient expressed understanding and was in agreement with this plan. He also understands that He can call clinic at any time with any questions, concerns, or complaints.     Cancer Staging Prostate cancer [facility]) Staging form: Prostate, AJCC 8th Edition - Clinical stage from 02/17/2020: Stage IVB (cT2c, cN0, cM1b, PSA: 8.6, Grade Group: 5) - Signed by Lloyd Huger, MD on 02/17/2020   Lloyd Huger, MD   03/12/2020 1:43 PM

## 2020-03-11 ENCOUNTER — Encounter: Payer: Self-pay | Admitting: Oncology

## 2020-03-11 NOTE — Progress Notes (Signed)
Patient would like to talk to provider about procedures.

## 2020-03-12 ENCOUNTER — Encounter: Payer: Self-pay | Admitting: Oncology

## 2020-03-12 ENCOUNTER — Other Ambulatory Visit: Payer: Self-pay

## 2020-03-12 ENCOUNTER — Telehealth: Payer: Self-pay | Admitting: *Deleted

## 2020-03-12 ENCOUNTER — Inpatient Hospital Stay: Payer: Medicare Other

## 2020-03-12 ENCOUNTER — Telehealth: Payer: Self-pay | Admitting: Pharmacist

## 2020-03-12 ENCOUNTER — Inpatient Hospital Stay: Payer: Medicare Other | Attending: Oncology

## 2020-03-12 ENCOUNTER — Inpatient Hospital Stay (HOSPITAL_BASED_OUTPATIENT_CLINIC_OR_DEPARTMENT_OTHER): Payer: Medicare Other | Admitting: Oncology

## 2020-03-12 VITALS — BP 144/61 | HR 50 | Temp 98.2°F | Resp 20 | Wt 241.8 lb

## 2020-03-12 DIAGNOSIS — Z5111 Encounter for antineoplastic chemotherapy: Secondary | ICD-10-CM | POA: Insufficient documentation

## 2020-03-12 DIAGNOSIS — C7951 Secondary malignant neoplasm of bone: Secondary | ICD-10-CM | POA: Diagnosis present

## 2020-03-12 DIAGNOSIS — C61 Malignant neoplasm of prostate: Secondary | ICD-10-CM

## 2020-03-12 LAB — CBC WITH DIFFERENTIAL/PLATELET
Abs Immature Granulocytes: 0.03 10*3/uL (ref 0.00–0.07)
Basophils Absolute: 0 10*3/uL (ref 0.0–0.1)
Basophils Relative: 1 %
Eosinophils Absolute: 0.1 10*3/uL (ref 0.0–0.5)
Eosinophils Relative: 1 %
HCT: 41.3 % (ref 39.0–52.0)
Hemoglobin: 13.8 g/dL (ref 13.0–17.0)
Immature Granulocytes: 0 %
Lymphocytes Relative: 20 %
Lymphs Abs: 1.5 10*3/uL (ref 0.7–4.0)
MCH: 28.2 pg (ref 26.0–34.0)
MCHC: 33.4 g/dL (ref 30.0–36.0)
MCV: 84.3 fL (ref 80.0–100.0)
Monocytes Absolute: 0.5 10*3/uL (ref 0.1–1.0)
Monocytes Relative: 7 %
Neutro Abs: 5.5 10*3/uL (ref 1.7–7.7)
Neutrophils Relative %: 71 %
Platelets: 183 10*3/uL (ref 150–400)
RBC: 4.9 MIL/uL (ref 4.22–5.81)
RDW: 15.4 % (ref 11.5–15.5)
WBC: 7.7 10*3/uL (ref 4.0–10.5)
nRBC: 0 % (ref 0.0–0.2)

## 2020-03-12 LAB — COMPREHENSIVE METABOLIC PANEL
ALT: 37 U/L (ref 0–44)
AST: 36 U/L (ref 15–41)
Albumin: 4.2 g/dL (ref 3.5–5.0)
Alkaline Phosphatase: 59 U/L (ref 38–126)
Anion gap: 9 (ref 5–15)
BUN: 18 mg/dL (ref 8–23)
CO2: 24 mmol/L (ref 22–32)
Calcium: 9.1 mg/dL (ref 8.9–10.3)
Chloride: 107 mmol/L (ref 98–111)
Creatinine, Ser: 0.84 mg/dL (ref 0.61–1.24)
GFR calc Af Amer: >60 mL/min (ref >60)
GFR calc non Af Amer: >60 mL/min (ref >60)
Glucose, Bld: 130 mg/dL — ABNORMAL HIGH (ref 70–99)
Potassium: 4.1 mmol/L (ref 3.5–5.1)
Sodium: 140 mmol/L (ref 135–145)
Total Bilirubin: 0.6 mg/dL (ref 0.3–1.2)
Total Protein: 7 g/dL (ref 6.5–8.1)

## 2020-03-12 LAB — PSA: Prostatic Specific Antigen: 0.19 ng/mL (ref 0.00–4.00)

## 2020-03-12 MED ORDER — APALUTAMIDE 60 MG PO TABS: 240.0000 mg | ORAL_TABLET | Freq: Every day | ORAL | 2 refills | Status: DC

## 2020-03-12 MED ORDER — DEGARELIX ACETATE 80 MG ~~LOC~~ SOLR
80.0000 mg | Freq: Once | SUBCUTANEOUS | Status: AC
  Administered 2020-03-12: 12:00:00 80 mg via SUBCUTANEOUS
  Filled 2020-03-12: qty 4

## 2020-03-12 MED ORDER — DENOSUMAB 120 MG/1.7ML ~~LOC~~ SOLN
120.0000 mg | Freq: Once | SUBCUTANEOUS | Status: AC
  Administered 2020-03-12: 12:00:00 120 mg via SUBCUTANEOUS
  Filled 2020-03-12: qty 1.7

## 2020-03-12 NOTE — Telephone Encounter (Addendum)
Oral Oncology Pharmacist Encounter  Received new prescription for Zytiga (abiraterone) for the treatment of metastatic prostate cancer, castrate sensitive in conjunction with ADT therapy, planned duration no relevant lab abnormalities. Patient is currently taking hydrocortisone, so prednisone will not be added at this time.  Originally considered Erleada (apalutamide) but there was an interaction with the patient's anticoagulate, apixaban.  CMP from 03/12/20 assessed, no relevant lab abnormalities. Prescription dose and frequency assessed.   Current medication list in Epic reviewed, one DDIs with abiraterone identified: -Abiraterone may increase the concentration of tamsulosin. Monitor patient for increased adverse events related to tamsulosin, eg hypotension, orthostasis.  Prescription has been e-scribed to the [facility] for benefits analysis and approval.  Oral Oncology Clinic will continue to follow for insurance authorization, copayment issues, initial counseling and start date.  Darl Pikes, PharmD, BCPS, BCOP, CPP Hematology/Oncology Clinical Pharmacist [facility] [place] [facility] [phone]  03/12/2020 6:35 PM

## 2020-03-12 NOTE — Telephone Encounter (Signed)
I did not think he ever started Erleada from his previous Urologist.  I'll talk with Alyson next week and figure it out.

## 2020-03-12 NOTE — Telephone Encounter (Signed)
Wife called reporting that patient had been taken off of Erleada due to interactions with Eliquis. Please advise

## 2020-03-15 NOTE — Telephone Encounter (Signed)
Wife informed that we will call her back to let her know if he is to start taking Erleada and to hold it for the time being until we call her. She was in agreement with this

## 2020-03-16 ENCOUNTER — Telehealth: Payer: Self-pay | Admitting: Pharmacy Technician

## 2020-03-16 MED ORDER — ABIRATERONE ACETATE 250 MG PO TABS: 1000.0000 mg | ORAL_TABLET | Freq: Every day | ORAL | 0 refills | Status: DC

## 2020-03-16 NOTE — Telephone Encounter (Signed)
Oral Oncology [facility] Advocate Encounter   Received notification from [place] that prior authorization for Andrew Callahan is required.   PA submitted on CoverMyMeds Key [id number]  Status is pending   Oral Oncology Clinic will continue to follow.  Andrew Callahan [place] Phone [phone] Fax [phone] 03/16/2020 4:39 PM

## 2020-03-16 NOTE — Telephone Encounter (Signed)
Called and spoke with patient's wife. Interact between [place] and Eliquis discussed with Dr. Grayland Ormond. Due to interaction, we are proceed with Zytiga (abiraterone). [name] stated her understanding of the plan.

## 2020-03-17 ENCOUNTER — Ambulatory Visit: Payer: Medicare Other | Admitting: Urology

## 2020-03-17 MED ORDER — ABIRATERONE ACETATE 250 MG PO TABS: 1000.0000 mg | ORAL_TABLET | Freq: Every day | ORAL | 0 refills | Status: DC

## 2020-03-17 NOTE — Telephone Encounter (Signed)
Oral Chemotherapy Pharmacist Encounter  Due to insurance restriction the medication could not be filled at [facility]. Prescription has been e-scribed to [facility].  Supportive information was faxed to [place]. Patient's wife notified.  Darl Pikes, PharmD, BCPS, [facility] Hematology/Oncology Clinical Pharmacist [facility] [place] [facility] [phone]  03/17/2020 11:58 AM

## 2020-03-17 NOTE — Telephone Encounter (Signed)
Oral Oncology Patient Advocate Encounter  Prior Authorization for Fabio Asa has been approved.    PA# [id number] Effective dates: 02/15/2020 through 03/16/2021  Patients co-pay is $1774.38.    [facility] is preferred specialty pharmacy.  Oral Oncology Clinic will continue to follow.   [place] Patient [place] Phone [phone] Fax [phone] 03/17/2020 8:40 AM

## 2020-03-18 ENCOUNTER — Telehealth: Payer: Medicare Other | Admitting: Licensed Clinical Social Worker

## 2020-03-18 NOTE — Telephone Encounter (Signed)
Called [facility] to check status of prescription.  Verdis Frederickson, rep, stated that generic Fabio Asa was delivered this morning to the patient.  His copay with [facility] was $65.  [place] Patient [place] Phone [phone] Fax [phone] 03/18/2020 2:07 PM

## 2020-03-19 NOTE — Telephone Encounter (Signed)
Oral Chemotherapy Pharmacist Encounter  [facility] delivered medication on 03/18/20. Patient knows to get started when medication delivered.   Patient Education I spoke with patient's wife Andrew Callahan for overview of new oral chemotherapy medication: Zytiga (abiraterone) for the treatment of metastatic prostate cancer, castrate sensitive in conjunction with ADT therapy, planned duration no relevant lab abnormalities. Patient is currently taking hydrocortisone, so prednisone will not be added at this time.   Counseled [name] on administration, dosing, side effects, monitoring, drug-food interactions, safe handling, storage, and disposal. Patient will take 4 tablets (1,000 mg total) by mouth daily. Take on an empty stomach 1 hour before or 2 hours after a meal.  Side effects include but not limited to: HTN, fatigue, decreased wbc.    Reviewed with Andrew Callahan importance of keeping a medication schedule and plan for any missed doses.  Andrew Callahan voiced understanding and appreciation. All questions answered. Medication handout placed in the mail and emailed.  Provided Andrew Callahan with Oral Chemotherapy Navigation Clinic phone number. Patient knows to call the office with questions or concerns. Oral Chemotherapy Navigation Clinic will continue to follow.  Darl Pikes, PharmD, BCPS, BCOP, CPP Hematology/Oncology Clinical Pharmacist [facility] [phone]  03/19/2020 1:04 PM

## 2020-03-22 DIAGNOSIS — R200 Anesthesia of skin: Secondary | ICD-10-CM | POA: Insufficient documentation

## 2020-03-24 ENCOUNTER — Other Ambulatory Visit: Payer: Self-pay

## 2020-03-24 ENCOUNTER — Encounter: Payer: Self-pay | Admitting: Urology

## 2020-03-24 ENCOUNTER — Ambulatory Visit (INDEPENDENT_AMBULATORY_CARE_PROVIDER_SITE_OTHER): Payer: Medicare Other | Admitting: Urology

## 2020-03-24 ENCOUNTER — Encounter: Payer: Self-pay | Admitting: Radiation Oncology

## 2020-03-24 VITALS — BP 190/91 | HR 58 | Ht 69.5 in | Wt 241.7 lb

## 2020-03-24 DIAGNOSIS — R399 Unspecified symptoms and signs involving the genitourinary system: Secondary | ICD-10-CM

## 2020-03-24 DIAGNOSIS — C61 Malignant neoplasm of prostate: Secondary | ICD-10-CM | POA: Diagnosis not present

## 2020-03-24 LAB — BLADDER SCAN AMB NON-IMAGING

## 2020-03-24 NOTE — Progress Notes (Signed)
   03/24/2020 1:07 PM   Andrew Callahan Apr 17, 1934 [id number]  Reason for visit: Follow up prostate cancer, urinary symptoms  HPI: I saw Andrew Callahan again in urology clinic for his metastatic prostate cancer and urinary symptoms.  To briefly summarize, he is an 86 year old relatively healthy and active male with a history of stroke and A. fib who was recently found to have metastatic prostate cancer with pathology showing Gleason score 4+5 = 9 acinar adenocarcinoma, as well as some intraductal carcinoma.  Staging with CT and bone scan showed diffuse osseous metastatic disease.  He is asymptomatic from his bony lesions.  He had an episode of urinary retention treated with a catheter placement, but since starting ADT, Flomax, and dutasteride, has been voiding spontaneously.  He has urinary frequency every 2-3 hours and some bothersome nocturia, but overall is doing well.  PVR is relatively normal at 158 mL in clinic today.  Prostate volume on MRI previously was 70 g.  He is being managed by Dr. Grayland Ormond in the cancer center with ADT and abiraterone/steroids.  Most recent PSA on 03/12/2020 was 0.19.  The patient is very adamant that he wants to undergo local treatment of the prostate with either radiation, protons, surgery, or cryotherapy.  I had an extensive conversation with the patient reviewing the AUA guidelines that do not recommend routine local treatment of prostate cancer in the setting of metastatic disease.  There is some new evidence that suggest there may be some benefit for primary radiotherapy to the prostate and commendation with ADT and metastatic hormone sensitive prostate cancer patients with low-volume metastatic disease, however he has widespread boney mets on bone scan.  Additionally, I am concerned that radiation would exacerbate his urinary symptoms of urgency/frequency/nocturia, and increases risk of recurrent urinary retention.  Finally, with his intraductal component, this typically  does not respond well to radiation, and I do not feel he would have an overall survival benefit from local therapy.  We discussed the role of ADT and shrinking the prostate and potentially continuing to improve his urinary symptoms over the next few months.  I again reiterated that my role in his treatment is for his urinary symptoms, and I would defer any systemic treatment options to Dr. Grayland Ormond in the medical oncology team.  -Follow-up in 8 weeks with PVR, sooner if urinary problems -If PVR significantly elevated or worsening urinary symptoms, would recommend cystoscopy to evaluate for obstructive tumor, and consider TURP first HOLEP for an outlet procedure  I spent 45 total minutes on the day of the encounter including pre-visit review of the medical record, face-to-face time with the patient, and post visit ordering of labs/imaging/tests.  Billey Co, [facility] [address], [place], [place] [address] [phone]

## 2020-03-25 ENCOUNTER — Inpatient Hospital Stay (HOSPITAL_BASED_OUTPATIENT_CLINIC_OR_DEPARTMENT_OTHER): Payer: Medicare Other | Admitting: Licensed Clinical Social Worker

## 2020-03-25 ENCOUNTER — Ambulatory Visit
Admission: RE | Admit: 2020-03-25 | Discharge: 2020-03-25 | Disposition: A | Discharge status: Home / Self Care | Payer: Medicare Other | Source: Ambulatory Visit | Attending: Radiation Oncology | Admitting: Radiation Oncology

## 2020-03-25 ENCOUNTER — Encounter: Payer: Self-pay | Admitting: Radiation Oncology

## 2020-03-25 ENCOUNTER — Encounter: Payer: Self-pay | Admitting: Licensed Clinical Social Worker

## 2020-03-25 VITALS — BP 159/88 | HR 57 | Temp 97.0°F | Resp 20 | Wt 242.0 lb

## 2020-03-25 DIAGNOSIS — Z8673 Personal history of transient ischemic attack (TIA), and cerebral infarction without residual deficits: Secondary | ICD-10-CM | POA: Diagnosis not present

## 2020-03-25 DIAGNOSIS — Z806 Family history of leukemia: Secondary | ICD-10-CM | POA: Diagnosis not present

## 2020-03-25 DIAGNOSIS — G4730 Sleep apnea, unspecified: Secondary | ICD-10-CM | POA: Insufficient documentation

## 2020-03-25 DIAGNOSIS — I10 Essential (primary) hypertension: Secondary | ICD-10-CM | POA: Diagnosis not present

## 2020-03-25 DIAGNOSIS — C61 Malignant neoplasm of prostate: Secondary | ICD-10-CM

## 2020-03-25 DIAGNOSIS — C7951 Secondary malignant neoplasm of bone: Secondary | ICD-10-CM | POA: Diagnosis not present

## 2020-03-25 DIAGNOSIS — Z87442 Personal history of urinary calculi: Secondary | ICD-10-CM | POA: Insufficient documentation

## 2020-03-25 DIAGNOSIS — I4891 Unspecified atrial fibrillation: Secondary | ICD-10-CM | POA: Insufficient documentation

## 2020-03-25 DIAGNOSIS — Z7901 Long term (current) use of anticoagulants: Secondary | ICD-10-CM | POA: Diagnosis not present

## 2020-03-25 DIAGNOSIS — E039 Hypothyroidism, unspecified: Secondary | ICD-10-CM | POA: Insufficient documentation

## 2020-03-25 DIAGNOSIS — E785 Hyperlipidemia, unspecified: Secondary | ICD-10-CM | POA: Insufficient documentation

## 2020-03-25 DIAGNOSIS — Z801 Family history of malignant neoplasm of trachea, bronchus and lung: Secondary | ICD-10-CM | POA: Insufficient documentation

## 2020-03-25 DIAGNOSIS — K5900 Constipation, unspecified: Secondary | ICD-10-CM | POA: Diagnosis not present

## 2020-03-25 NOTE — Progress Notes (Signed)
REFERRING PROVIDER: Lloyd Huger, MD [place] [place],  [place] [address]  PRIMARY PROVIDER:  Rusty Aus, MD  PRIMARY REASON FOR VISIT:  1. Prostate cancer ([place])   2. Family history of lung cancer   3. Family history of leukemia      HISTORY OF PRESENT ILLNESS:   Andrew Callahan, a 86 y.o. male, was seen for a [place] cancer genetics consultation at the request of Dr. Grayland Ormond due to a personal history of metastatic prostate cancer.  Andrew Callahan presents to clinic today to discuss the possibility of a hereditary predisposition to cancer, genetic testing, and to further clarify his future cancer risks, as well as potential cancer risks for family members.   In 2021, at the age of 60, Andrew Callahan was diagnosed with metastatic prostate cancer, stage IVB (Gleason 4+5). This is currently being treated with antiandrogen medication and hormone therapy.  Patient's last colonoscopy was in 2019, 1 polyp was removed.   CANCER HISTORY:  Oncology History  Prostate cancer ([place])  02/15/2020 Initial Diagnosis   Prostate cancer ([place])   02/17/2020 Cancer Staging   Staging form: Prostate, AJCC 8th Edition - Clinical stage from 02/17/2020: Stage IVB (cT2c, cN0, cM1b, PSA: 8.6, Grade Group: 5) - Signed by Lloyd Huger, MD on 02/17/2020     Past Medical History:  Diagnosis Date  . Chronic constipation   . Elevated prostate specific antigen (PSA)   . Family history of leukemia   . Family history of lung cancer   . History of kidney stones   . Hyperlipidemia   . Hypertension   . Hypothyroid   . Hypothyroidism   . Paroxysmal A-fib ([place])   . Sinus bradycardia   . Sleep apnea    uses CPAP  . Stroke [facility])     Past Surgical History:  Procedure Laterality Date  . BACK SURGERY  1997   L3, 4, 5 laminectomy   . BACK SURGERY  1998   hard wire removed, fusion of L3-5  . BACK SURGERY  2009   laminotomy/foraminotomy w/ decompression of nerve root, facet thermal ablation L2-3.   Marland Kitchen [place]  2010  . KNEE SURGERY Left 2007   Arthroscopic partial knee replacement, replaced medial side   . MOHS SURGERY Right 2014   squamous cell carcinoma  . NECK SURGERY  2008   fusion with hardwire in place C3 through C7   . pituitary adenoma  1994   benign  . PROSTATE BIOPSY N/A 01/01/2020   Procedure: PROSTATE BIOPSY URO NAV FUSION;  Surgeon: Royston Cowper, MD;  Location: [facility] ORS;  Service: Urology;  Laterality: N/A;  . SPINE SURGERY    . SPINE SURGERY  2016   fusion L4     Social History   Socioeconomic History  . Marital status: Married    Spouse name: Not on file  . Number of children: Not on file  . Years of education: Not on file  . Highest education level: Not on file  Occupational History  . Not on file  Tobacco Use  . Smoking status: Former Research scientist (life sciences)  . Smokeless tobacco: Never Used  . Tobacco comment: quit 50 years ago  Substance and Sexual Activity  . Alcohol use: Yes    Comment: rarely  . Drug use: Never  . Sexual activity: Yes    Birth control/protection: None  Other Topics Concern  . Not on file  Social History Narrative  . Not on file  Social Determinants of Health   Financial Resource Strain:   . Difficulty of Paying Living Expenses:   Food Insecurity:   . Worried About Charity fundraiser in the Last Year:   . Arboriculturist in the Last Year:   Transportation Needs:   . Film/video editor (Medical):   Marland Kitchen Lack of Transportation (Non-Medical):   Physical Activity:   . Days of Exercise per Week:   . Minutes of Exercise per Session:   Stress:   . Feeling of Stress :   Social Connections:   . Frequency of Communication with Friends and Family:   . Frequency of Social Gatherings with Friends and Family:   . Attends Religious Services:   . Active Member of Clubs or Organizations:   . Attends Archivist Meetings:   Marland Kitchen Marital Status:      FAMILY HISTORY:  We obtained a detailed, 4-generation family history.   Significant diagnoses are listed below: Family History  Problem Relation Age of Onset  . Stroke Mother   . Heart attack Father   . Leukemia Sister 39  . Lung cancer Brother 61   Andrew Callahan has 3 sons and 2 daughters, no cancers. Patient had 2 brothers and 1 sister. One of his brothers had lung cancer and died at 76. His sister had leukemia and died at 63.   Andrew Callahan mother died at 49 of a stroke. Patient has limited information on this side of the family. He had 3 uncles and 1 aunt, no cancer that he is aware of. Maternal grandfather passed at 73, grandmother passed at 105.  Andrew Callahan father died at 61 of a heart attack. The rest of this side of the family is in [place] and patient does not have information about them.   Andrew Callahan is unaware of previous family history of genetic testing for hereditary cancer risks. Patient's maternal ancestors are of [place] descent, and paternal ancestors are of [place] descent. There is no reported Ashkenazi Jewish ancestry. There is no known consanguinity.  GENETIC COUNSELING ASSESSMENT: Andrew Callahan is a 86 y.o. male with a personal history of metastatic prostate cancer which is somewhat suggestive of a hereditary cancer syndrome and predisposition to cancer. We, therefore, discussed and recommended the following at today's visit.   DISCUSSION: We discussed that 5 - 10% of prostate cancer is hereditary, with most cases associated with BRCA1/BRCA2 mutations.  There are other genes that can be associated with hereditary prostate cancer syndromes. We discussed that testing is beneficial for several reasons including knowing if he is a candidate for certain targeted therapies, knowing how to follow individuals after completing their treatment, and understand if other family members could be at risk for cancer and allow them to undergo genetic testing.   We reviewed the characteristics, features and inheritance patterns of hereditary cancer syndromes. We also  discussed genetic testing, including the appropriate family members to test, the process of testing, insurance coverage and turn-around-time for results. We discussed the implications of a negative, positive and/or variant of uncertain significant result. We recommended Andrew Callahan pursue genetic testing for the Common Hereditary Cancers  gene panel.   The Common Hereditary Cancers Panel offered by [facility] includes sequencing and/or deletion duplication testing of the following 48 genes: APC, ATM, AXIN2, BARD1, BMPR1A, BRCA1, BRCA2, BRIP1, CDH1, CDKN2A (p14ARF), CDKN2A (p16INK4a), CKD4, CHEK2, CTNNA1, DICER1, EPCAM (Deletion/duplication testing only), GREM1 (promoter region deletion/duplication testing only), KIT, MEN1, MLH1, MSH2, MSH3, MSH6, MUTYH, NBN,  NF1, NHTL1, PALB2, PDGFRA, PMS2, POLD1, POLE, PTEN, RAD50, RAD51C, RAD51D, RNF43, SDHB, SDHC, SDHD, SMAD4, SMARCA4. STK11, TP53, TSC1, TSC2, and VHL.  The following genes were evaluated for sequence changes only: SDHA and HOXB13 c.251G>A variant only.  Based on Andrew Callahan personal history of cancer, he meets medical criteria for genetic testing. Despite that he meets criteria, he may still have an out of pocket cost.   PLAN: After considering the risks, benefits, and limitations, Andrew Callahan provided informed consent to pursue genetic testing. A saliva sample will be sent to his home and the sample will be sent to [facility] for analysis of the Common Hereditary Cancers Panel. Results should be available within approximately 2-3 weeks' time, at which point they will be disclosed by telephone to Andrew Callahan, as will any additional recommendations warranted by these results. Andrew Callahan will receive a summary of his genetic counseling visit and a copy of his results once available. This information will also be available in Epic.   Andrew Callahan questions were answered to his satisfaction today. Our contact information was provided should additional questions or  concerns arise. Thank you for the referral and allowing [place] to share in the care of your patient.   Andrew Rogue, MS, [facility] Genetic Counselor [email or web address] Phone: [phone]  The patient was seen for a total of 20 minutes in face-to-face genetic counseling. Patient's wife Andrew Callahan was also present.  Drs. Magrinat, Lindi Adie and/or Burr Medico were available for discussion regarding this case.   _______________________________________________________________________ For Office Staff:  Number of people involved in session: 2 Was an Intern/ student involved with case: no

## 2020-03-25 NOTE — Consult Note (Signed)
NEW PATIENT EVALUATION  Name: Andrew Callahan  MRN: [id number]  Date:   03/25/2020     DOB: March 24, 1934   This 86 y.o. male patient presents to the clinic for initial evaluation of stage IV adenocarcinoma the prostate Gleason 9 (4+5) currently on androgen deprivation therapy.  REFERRING PHYSICIAN: Rusty Aus, MD  CHIEF COMPLAINT:  Chief Complaint  Patient presents with  . Prostate Cancer    Initial consultation    DIAGNOSIS: The encounter diagnosis was Prostate cancer ([place]).   PREVIOUS INVESTIGATIONS:  Bone scan CT scan MRI of prostate reviewed Pathology report reviewed Clinical notes reviewed  HPI: Patient is a 86 year old healthy male who is currently under treatment for stage IV metastatic adenocarcinoma the prostate acinar type with some intraductal carcinoma.  He recently had a CVA secondary to A. fib although he is functional and in good overall general status.  He has been started on ADT therapy as well as abiraterone/steroids.  He is having no bone pain.  His bone scan shows possible areas of metastatic disease within the ribs lumbar spine and superior pubic ramus and acetabulum.  He does have significant lower urinary tract symptoms such as frequency and urgency as well as nocturia every 2 hours.  CT scan demonstrates osseous metastatic disease in T11 L1 L5 and L4.  He is also been seen by Dr. Jeb Levering in urology for management of some of his lower urinary tract symptoms.  Patient is being followed and managed by Dr. Grayland Ormond and has been adamant about requesting treatment to his prostate with possible radiation therapy.  He is seen today for radiation oncology opinion.  Aside from his urinary tract symptoms he is having no bone pain no difficulty ambulating.  PLANNED TREATMENT REGIMEN: Continue current regiment of treatment versus radiation therapy to prostate pelvic nodes and lumbar spine  PAST MEDICAL HISTORY:  has a past medical history of Chronic constipation, Elevated  prostate specific antigen (PSA), History of kidney stones, Hyperlipidemia, Hypertension, Hypothyroid, Hypothyroidism, Paroxysmal A-fib ([place]), Sinus bradycardia, Sleep apnea, and Stroke ([place]).    PAST SURGICAL HISTORY:  Past Surgical History:  Procedure Laterality Date  . BACK SURGERY  1997   L3, 4, 5 laminectomy   . BACK SURGERY  1998   hard wire removed, fusion of L3-5  . BACK SURGERY  2009   laminotomy/foraminotomy w/ decompression of nerve root, facet thermal ablation L2-3.   Marland Kitchen [place]  2010  . KNEE SURGERY Left 2007   Arthroscopic partial knee replacement, replaced medial side   . MOHS SURGERY Right 2014   squamous cell carcinoma  . NECK SURGERY  2008   fusion with hardwire in place C3 through C7   . pituitary adenoma  1994   benign  . PROSTATE BIOPSY N/A 01/01/2020   Procedure: PROSTATE BIOPSY URO NAV FUSION;  Surgeon: Royston Cowper, MD;  Location: [facility] ORS;  Service: Urology;  Laterality: N/A;  . SPINE SURGERY    . SPINE SURGERY  2016   fusion L4     FAMILY HISTORY: family history includes Heart attack in his father; Leukemia in his sister; Lung cancer in his brother; Stroke in his mother.  SOCIAL HISTORY:  reports that he has quit smoking. He has never used smokeless tobacco. He reports current alcohol use. He reports that he does not use drugs.  ALLERGIES: Metoprolol and Rivaroxaban  MEDICATIONS:  Current Outpatient Medications  Medication Sig Dispense Refill  . abiraterone acetate (ZYTIGA) 250 MG tablet Take 4 tablets (  1,000 mg total) by mouth daily. Take on an empty stomach 1 hour before or 2 hours after a meal 120 tablet 0  . B Complex-C (SUPER B COMPLEX PO) Take by mouth daily.    . degarelix (FIRMAGON, 240 MG DOSE,) 120 mg injection Inject 120 mg as directed as directed    . denosumab (XGEVA) 120 MG/1.7ML SOLN injection Inject into the skin.    Marland Kitchen dutasteride (AVODART) 0.5 MG capsule Take 0.5 mg by mouth daily.    Marland Kitchen ELIQUIS 5 MG TABS tablet Take 5  mg by mouth 2 (two) times daily.    Marland Kitchen gabapentin (NEURONTIN) 100 MG capsule Take 200 mg by mouth 2 (two) times daily.     . hydrocortisone (CORTEF) 10 MG tablet Take 10 mg by mouth 2 (two) times daily.     Marland Kitchen levothyroxine (SYNTHROID) 50 MCG tablet Take 50 mcg by mouth daily before breakfast.    . Misc Natural Products (OSTEO BI-FLEX ADV JOINT SHIELD PO) Take by mouth.    . Multiple Vitamin (MULTIVITAMIN WITH MINERALS) TABS tablet Take 1 tablet by mouth daily. One-A-Day Men's 61+    . polycarbophil (FIBERCON) 625 MG tablet Take 625 mg by mouth daily.    . polyethylene glycol (MIRALAX / GLYCOLAX) 17 g packet Take 17 g by mouth daily.    . Probiotic Product (ALIGN) 4 MG CAPS Take 4 mg by mouth 2 (two) times daily.    . rosuvastatin (CRESTOR) 20 MG tablet Take 20 mg by mouth daily.    . tamsulosin (FLOMAX) 0.4 MG CAPS capsule Take 0.4 mg by mouth daily.    . valsartan (DIOVAN) 320 MG tablet Take 320 mg by mouth daily.     No current facility-administered medications for this encounter.    ECOG PERFORMANCE STATUS:  1 - Symptomatic but completely ambulatory  REVIEW OF SYSTEMS: Patient denies any weight loss, fatigue, weakness, fever, chills or night sweats. Patient denies any loss of vision, blurred vision. Patient denies any ringing  of the ears or hearing loss. No irregular heartbeat. Patient denies heart murmur or history of fainting. Patient denies any chest pain or pain radiating to her upper extremities. Patient denies any shortness of breath, difficulty breathing at night, cough or hemoptysis. Patient denies any swelling in the lower legs. Patient denies any nausea vomiting, vomiting of blood, or coffee ground material in the vomitus. Patient denies any stomach pain. Patient states has had normal bowel movements no significant constipation or diarrhea. Patient denies any dysuria, hematuria or significant nocturia. Patient denies any problems walking, swelling in the joints or loss of balance.  Patient denies any skin changes, loss of hair or loss of weight. Patient denies any excessive worrying or anxiety or significant depression. Patient denies any problems with insomnia. Patient denies excessive thirst, polyuria, polydipsia. Patient denies any swollen glands, patient denies easy bruising or easy bleeding. Patient denies any recent infections, allergies or URI. Patient "s visual fields have not changed significantly in recent time.   PHYSICAL EXAM: BP (!) 159/88 (BP Location: Left Arm, Patient Position: Sitting, Cuff Size: Large)   Pulse (!) 57   Temp (!) 97 F (36.1 C)   Resp 20   Wt 242 lb (109.8 kg)   BMI 35.22 kg/m  Well-developed well-nourished patient in NAD. HEENT reveals PERLA, EOMI, discs not visualized.  Oral cavity is clear. No oral mucosal lesions are identified. Neck is clear without evidence of cervical or supraclavicular adenopathy. Lungs are clear to A&P. Cardiac examination  is essentially unremarkable with regular rate and rhythm without murmur rub or thrill. Abdomen is benign with no organomegaly or masses noted. Motor sensory and DTR levels are equal and symmetric in the upper and lower extremities. Cranial nerves II through XII are grossly intact. Proprioception is intact. No peripheral adenopathy or edema is identified. No motor or sensory levels are noted. Crude visual fields are within normal range.  LABORATORY DATA: Pathology report reviewed    RADIOLOGY RESULTS: MRI scan of prostate bone scan and CT scans all reviewed compatible with above-stated findings   IMPRESSION: Stage IV castrate sensitive prostate cancer in 86 year old male.  PLAN: At this time I have expressed my belief that the current course of treatment under Dr. Grayland Ormond is the correct 1 for patient with castrate sensitive stage IV adenocarcinoma with multiple sites of bony metastasis.  I did discuss with him the [facility] study showing some overall survival benefit in treating his prostate as well  as sites of metastatic disease.  Also addressing his acinar and intraductal component both of which have been successfully treated with radiation therapy as reported in or read journal.  If patient opted for treatment I would treat 8000 cGy to his prostate also including his pelvic nodes.  Also would treat to 3000 cGy his lumbar spine.  I certainly cannot incorporate all areas of metastatic disease at this time but that could be addressed in future treatments.  I explained to him the arduous role of radiation therapy over approximately 10 weeks to treat all these areas of involvement and which at 86 years old would probably not related that significant overall survival advantage.  My best advice would be to continue with his current regiment of treatment with ADT therapy as well asabiraterone/steroids.  Patient's wife was accompanying him today all questions were answered we also discussed proton therapy or an cryotherapy which I both tried to dissuade him from investigating.  They will return respond tested about a week's time after consideration.  I would like to take this opportunity to thank you for allowing me to participate in the care of your patient.Noreene Filbert, MD

## 2020-04-03 NOTE — Progress Notes (Signed)
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  INTERVAL HISTORY: Patient returns to clinic today for further evaluation and continuation of Xgeva, Firmagon, and Zytiga.  He continues to have multiple questions regarding his treatments.  Overall, he is tolerating them well without significant side effects.  He currently feels well and is asymptomatic.  He does not complain of pain today.  He has no neurologic complaints.  He denies any recent fevers or illnesses.  He has a good appetite and denies weight loss.  He has no chest pain, shortness of breath, cough, or hemoptysis.  He denies any nausea, vomiting, constipation, or diarrhea.  He has nocturia and frequency, but no other urinary complaints.  Patient offers no further specific complaints today.  REVIEW OF SYSTEMS:   Review of Systems  Constitutional: Negative.  Negative for fever, malaise/fatigue and weight loss.  Respiratory: Negative.  Negative for cough and hemoptysis.   Cardiovascular: Negative.  Negative for chest pain and leg swelling.  Gastrointestinal: Negative.  Negative for abdominal pain.  Genitourinary: Positive for frequency.  Musculoskeletal: Negative.  Negative for back pain.  Skin: Negative.  Negative for rash.  Neurological: Negative.  Negative for dizziness, focal weakness, weakness and headaches.  Psychiatric/Behavioral: Negative.  The patient is not nervous/anxious.     As per HPI. Otherwise, a complete review of systems is negative.  PAST MEDICAL HISTORY: Past Medical History:  Diagnosis Date  . Chronic constipation   . Elevated prostate specific antigen (PSA)   . Family history of  leukemia   . Family history of lung cancer   . History of kidney stones   . Hyperlipidemia   . Hypertension   . Hypothyroid   . Hypothyroidism   . Paroxysmal A-fib ([place])   . Sinus bradycardia   . Sleep apnea    uses CPAP  . Stroke [facility])     PAST SURGICAL HISTORY: Past Surgical History:  Procedure Laterality Date  . BACK SURGERY  1997   L3, 4, 5 laminectomy   . BACK SURGERY  1998   hard wire removed, fusion of L3-5  . BACK SURGERY  2009   laminotomy/foraminotomy w/ decompression of nerve root, facet thermal ablation L2-3.   Marland Kitchen [place]  2010  . KNEE SURGERY Left 2007   Arthroscopic partial knee replacement, replaced medial side   . MOHS SURGERY Right 2014   squamous cell carcinoma  . NECK SURGERY  2008   fusion with hardwire in place C3 through C7   . pituitary adenoma  1994   benign  . PROSTATE BIOPSY N/A 01/01/2020   Procedure: PROSTATE BIOPSY URO NAV FUSION;  Surgeon: Royston Cowper, MD;  Location: [facility] ORS;  Service: Urology;  Laterality: N/A;  . SPINE SURGERY    . SPINE SURGERY  2016   fusion L4     FAMILY HISTORY: Family History  Problem Relation Age of Onset  . Stroke Mother   . Heart attack Father   . Leukemia Sister 33  . Lung cancer Brother 27    ADVANCED DIRECTIVES (Y/N):  N  HEALTH MAINTENANCE: Social History   Tobacco Use  . Smoking status: Former Research scientist (life sciences)  . Smokeless tobacco: Never Used  . Tobacco comment:  quit 50 years ago  Substance Use Topics  . Alcohol use: Yes    Comment: rarely  . Drug use: Never     Colonoscopy:  PAP:  Bone density:  Lipid panel:  Allergies  Allergen Reactions  . Metoprolol Other (See Comments)    dizziness   . Rivaroxaban Other (See Comments)    hematuria     Current Outpatient Medications  Medication Sig Dispense Refill  . abiraterone acetate (ZYTIGA) 250 MG tablet Take 4 tablets (1,000 mg total) by mouth daily. Take on an empty stomach 1 hour before or 2 hours after a meal 120 tablet 0   . B Complex-C (SUPER B COMPLEX PO) Take by mouth daily.    . degarelix (FIRMAGON, 240 MG DOSE,) 120 mg injection Inject 120 mg as directed as directed    . denosumab (XGEVA) 120 MG/1.7ML SOLN injection Inject into the skin.    Marland Kitchen dutasteride (AVODART) 0.5 MG capsule Take 0.5 mg by mouth daily.    Marland Kitchen ELIQUIS 5 MG TABS tablet Take 5 mg by mouth 2 (two) times daily.    . hydrocortisone (CORTEF) 10 MG tablet Take 10 mg by mouth 2 (two) times daily.     Marland Kitchen levothyroxine (SYNTHROID) 50 MCG tablet Take 50 mcg by mouth daily before breakfast.    . Misc Natural Products (OSTEO BI-FLEX ADV JOINT SHIELD PO) Take by mouth.    . Multiple Vitamin (MULTIVITAMIN WITH MINERALS) TABS tablet Take 1 tablet by mouth daily. One-A-Day Men's 7+    . polycarbophil (FIBERCON) 625 MG tablet Take 625 mg by mouth daily.    . polyethylene glycol (MIRALAX / GLYCOLAX) 17 g packet Take 17 g by mouth daily.    . Probiotic Product (ALIGN) 4 MG CAPS Take 4 mg by mouth 2 (two) times daily.    . rosuvastatin (CRESTOR) 20 MG tablet Take 20 mg by mouth daily.    . tamsulosin (FLOMAX) 0.4 MG CAPS capsule Take 0.4 mg by mouth daily. Pt taking twice a day now    . valsartan (DIOVAN) 320 MG tablet Take 320 mg by mouth daily.     No current facility-administered medications for this visit.    OBJECTIVE: Vitals:   04/09/20 1044  BP: 126/73  Pulse: (!) 57  Resp: 18  Temp: 97.6 F (36.4 C)  SpO2: 99%     Body mass index is 34.96 kg/m.    ECOG FS:0 - Asymptomatic  General: Well-developed, well-nourished, no acute distress. Eyes: Pink conjunctiva, anicteric sclera. HEENT: Normocephalic, moist mucous membranes. Lungs: No audible wheezing or coughing. Heart: Regular rate and rhythm. Abdomen: Soft, nontender, no obvious distention. Musculoskeletal: No edema, cyanosis, or clubbing. Neuro: Alert, answering all questions appropriately. Cranial nerves grossly intact. Skin: No rashes or petechiae noted. Psych: Normal affect.  LAB  RESULTS:  Lab Results  Component Value Date   NA 140 04/09/2020   K 4.0 04/09/2020   CL 110 04/09/2020   CO2 23 04/09/2020   GLUCOSE 149 (H) 04/09/2020   BUN 19 04/09/2020   CREATININE 0.85 04/09/2020   CALCIUM 8.6 (L) 04/09/2020   PROT 6.6 04/09/2020   ALBUMIN 4.0 04/09/2020   AST 33 04/09/2020   ALT 25 04/09/2020   ALKPHOS 59 04/09/2020   BILITOT 0.8 04/09/2020   GFRNONAA >60 04/09/2020   GFRAA >60 04/09/2020    Lab Results  Component Value Date   WBC 6.6 04/09/2020   NEUTROABS 4.9 04/09/2020   HGB 12.5 (L) 04/09/2020   HCT 37.5 (  L) 04/09/2020   MCV 86.0 04/09/2020   PLT 164 04/09/2020     STUDIES: No results found.  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  PLAN:    1. 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  Patient's PSA was initially greater than 8.0, but his most recent result was reported at 0.19.  Today's result is pending.  Proceed with Mills Koller and Delton See today.  Continue Zytiga as directed.  Return to clinic in 4 weeks for repeat laboratory work, further evaluation, and continuation of treatment.   2.  Bony metastasis: Xgeva as above. 3.  Urinary frequency: Appreciate urology input.  Continue Flomax as directed.  Follow-up with urology as indicated.    I spent a total of 30 minutes reviewing chart data, face-to-face evaluation with the patient, counseling and coordination of care as detailed above.    Patient expressed understanding and was in agreement with this plan. He also understands that He can call clinic at any time with any questions, concerns, or complaints.   Cancer Staging Prostate cancer [facility]) Staging form: Prostate, AJCC 8th Edition - Clinical stage from 02/17/2020: Stage IVB (cT2c, cN0, cM1b, PSA: 8.6, Grade Group: 5) - Signed by Lloyd Huger, MD on 02/17/2020   Lloyd Huger, MD   04/09/2020 12:48  PM

## 2020-04-07 ENCOUNTER — Ambulatory Visit: Payer: Self-pay | Admitting: Licensed Clinical Social Worker

## 2020-04-07 ENCOUNTER — Encounter: Payer: Self-pay | Admitting: Licensed Clinical Social Worker

## 2020-04-07 ENCOUNTER — Telehealth: Payer: Self-pay | Admitting: Licensed Clinical Social Worker

## 2020-04-07 DIAGNOSIS — C61 Malignant neoplasm of prostate: Secondary | ICD-10-CM

## 2020-04-07 DIAGNOSIS — Z801 Family history of malignant neoplasm of trachea, bronchus and lung: Secondary | ICD-10-CM

## 2020-04-07 DIAGNOSIS — Z806 Family history of leukemia: Secondary | ICD-10-CM

## 2020-04-07 DIAGNOSIS — Z1379 Encounter for other screening for genetic and chromosomal anomalies: Secondary | ICD-10-CM

## 2020-04-07 NOTE — Progress Notes (Signed)
HPI:  [name] was previously seen in the [place] clinic due to a personal history of metastatic prostate cancer Andrew concerns regarding a hereditary predisposition to cancer. Please refer to our prior cancer genetics clinic note for more information regarding our discussion, assessment Andrew recommendations, at the time. [name] recent genetic test results were disclosed to him, as were recommendations warranted by these results. These results Andrew recommendations are discussed in more detail below.  CANCER HISTORY:  Oncology History  Prostate cancer ([place])  02/15/2020 Initial Diagnosis   Prostate cancer ([place])   02/17/2020 Cancer Staging   Staging form: Prostate, AJCC 8th Edition - Clinical stage from 02/17/2020: Stage IVB (cT2c, cN0, cM1b, PSA: 8.6, Grade Group: 5) - Signed by Lloyd Huger, MD on 02/17/2020    Genetic Testing   Negative genetic testing. No pathogenic variants identified on the [facility] Common Hereditary Cancers Panel. VUS in [facility] identified. The report date is 04/07/2020.  The Common Hereditary Cancers Panel offered by [facility] includes sequencing Andrew/or deletion duplication testing of the following 48 genes: APC, ATM, AXIN2, BARD1, BMPR1A, BRCA1, BRCA2, BRIP1, CDH1, CDKN2A (p14ARF), CDKN2A (p16INK4a), CKD4, CHEK2, CTNNA1, DICER1, EPCAM (Deletion/duplication testing only), GREM1 (promoter region deletion/duplication testing only), KIT, MEN1, MLH1, MSH2, MSH3, MSH6, MUTYH, NBN, NF1, NHTL1, PALB2, PDGFRA, PMS2, POLD1, POLE, PTEN, RAD50, RAD51C, RAD51D, RNF43, SDHB, SDHC, SDHD, SMAD4, SMARCA4. STK11, TP53, TSC1, TSC2, Andrew VHL.  The following genes were evaluated for sequence changes only: SDHA Andrew HOXB13 c.251G>A variant only.     FAMILY HISTORY:  We obtained a detailed, 4-generation family history.  Significant diagnoses are listed below: Family History  Problem Relation Age of Onset  . Stroke Mother   . Heart attack Father   . Leukemia Sister 6  . Lung  cancer Brother 37    [name] has 3 sons Andrew 2 daughters, no cancers. Patient had 2 brothers Andrew 1 sister. One of his brothers had lung cancer Andrew died at 43. His sister had leukemia Andrew died at 49.   [name] died at 19 of a stroke. Patient has limited information on this side of the family. He had 3 uncles Andrew 1 aunt, no cancer that he is aware of. Maternal grandfather passed at 54, grandmother passed at 47.  [name] father died at 12 of a heart attack. The rest of this side of the family is in [place] Andrew patient does not have information about them.   [name] is unaware of previous family history of genetic testing for hereditary cancer risks. Patient's maternal ancestors are of [place] descent, Andrew paternal ancestors are of [place] descent. There is no reported Ashkenazi Jewish ancestry. There is no known consanguinity.  GENETIC TEST RESULTS: Genetic testing reported out on 04/07/2020 through the Common Hereditary Cancers cancer panel found no pathogenic mutations. The Common Hereditary Cancers Panel offered by [facility] includes sequencing Andrew/or deletion duplication testing of the following 48 genes: APC, ATM, AXIN2, BARD1, BMPR1A, BRCA1, BRCA2, BRIP1, CDH1, CDKN2A (p14ARF), CDKN2A (p16INK4a), CKD4, CHEK2, CTNNA1, DICER1, EPCAM (Deletion/duplication testing only), GREM1 (promoter region deletion/duplication testing only), KIT, MEN1, MLH1, MSH2, MSH3, MSH6, MUTYH, NBN, NF1, NHTL1, PALB2, PDGFRA, PMS2, POLD1, POLE, PTEN, RAD50, RAD51C, RAD51D, RNF43, SDHB, SDHC, SDHD, SMAD4, SMARCA4. STK11, TP53, TSC1, TSC2, Andrew VHL.  The following genes were evaluated for sequence changes only: SDHA Andrew HOXB13 c.251G>A variant only. The test report has been scanned into EPIC Andrew is located under the Molecular Pathology section of the Results Review  tab.  A portion of the result report is included below for reference.     We discussed with [name] that because current genetic testing is not  perfect, it is possible there may be a gene mutation in one of these genes that current testing cannot detect, but that chance is small.  We also discussed, that there could be another gene that has not yet been discovered, or that we have not yet tested, that is responsible for the cancer diagnoses in the family. It is also possible there is a hereditary cause for the cancer in the family that [name] did not inherit Andrew therefore was not identified in his testing.  Therefore, it is important to remain in touch with cancer genetics in the future so that we can continue to offer [name] the most up to date genetic testing.   Genetic testing did identify a variant of uncertain significance (VUS) was identified in the [facility].  At this time, it is unknown if this variant is associated with increased cancer risk or if this is a normal finding, but most variants such as this get reclassified to being inconsequential. It should not be used to make medical management decisions. With time, we suspect the lab will determine the significance of this variant, if any. If we do learn more about it, we will try to contact [name] to discuss it further. However, it is important to stay in touch with [place] periodically Andrew keep the address Andrew phone number up to date.  ADDITIONAL GENETIC TESTING: We discussed with [name] that his genetic testing was fairly extensive.  If there are genes identified to increase cancer risk that can be analyzed in the future, we would be happy to discuss Andrew coordinate this testing at that time.    CANCER SCREENING RECOMMENDATIONS: [name] test result is considered negative (normal).  This means that we have not identified a hereditary cause for his personal Andrew family history of cancer at this time. Most cancers happen by chance Andrew this negative test suggests that his cancer may fall into this category.    While reassuring, this does not definitively rule out a hereditary predisposition  to cancer. It is still possible that there could be genetic mutations that are undetectable by current technology. There could be genetic mutations in genes that have not been tested or identified to increase cancer risk.  Therefore, it is recommended he continue to follow the cancer management Andrew screening guidelines provided by his oncology Andrew primary healthcare provider.   An individual's cancer risk Andrew medical management are not determined by genetic test results alone. Overall cancer risk assessment incorporates additional factors, including personal medical history, family history, Andrew any available genetic information that may result in a personalized plan for cancer prevention Andrew surveillance.  RECOMMENDATIONS FOR FAMILY MEMBERS:  Relatives in this family might be at some increased risk of developing cancer, over the general population risk, simply due to the family history of cancer.  We recommended male relatives in this family have a yearly mammogram beginning at age 20, or 40 years younger than the earliest onset of cancer, an annual clinical breast exam, Andrew perform monthly breast self-exams. Male relatives in this family should also have a gynecological exam as recommended by their primary provider. All family members should have a colonoscopy by age 57, or as directed by their physicians.  FOLLOW-UP: Lastly, we discussed with [name] that cancer genetics is a rapidly advancing  field Andrew it is possible that new genetic tests will be appropriate for him Andrew/or his family members in the future. We encouraged him to remain in contact with cancer genetics on an annual basis so we can update his personal Andrew family histories Andrew let him know of advances in cancer genetics that may benefit this family.   Our contact number was provided. Mr. Callahan questions were answered to his satisfaction, Andrew he knows he is welcome to call us at anytime with additional questions or concerns.   Faith Rogue, MS, [facility] Genetic Counselor [email or web address] Phone: [phone]

## 2020-04-07 NOTE — Telephone Encounter (Signed)
Revealed negative genetic testing.  Revealed that a VUS in [facility] was identified. This normal result is reassuring and indicates that it is unlikely Andrew Callahan cancer is due to a hereditary cause.  It is unlikely that there is an increased risk of another cancer due to a mutation in one of these genes.  However, genetic testing is not perfect, and cannot definitively rule out a hereditary cause.  It will be important for him to keep in contact with genetics to learn if any additional testing may be needed in the future.

## 2020-04-09 ENCOUNTER — Encounter: Payer: Self-pay | Admitting: Oncology

## 2020-04-09 ENCOUNTER — Inpatient Hospital Stay: Payer: Medicare Other

## 2020-04-09 ENCOUNTER — Inpatient Hospital Stay (HOSPITAL_BASED_OUTPATIENT_CLINIC_OR_DEPARTMENT_OTHER): Payer: Medicare Other | Admitting: Oncology

## 2020-04-09 ENCOUNTER — Other Ambulatory Visit: Payer: Self-pay

## 2020-04-09 VITALS — BP 126/73 | HR 57 | Temp 97.6°F | Resp 18 | Wt 240.2 lb

## 2020-04-09 DIAGNOSIS — C61 Malignant neoplasm of prostate: Secondary | ICD-10-CM

## 2020-04-09 DIAGNOSIS — Z5111 Encounter for antineoplastic chemotherapy: Secondary | ICD-10-CM | POA: Diagnosis not present

## 2020-04-09 LAB — CBC WITH DIFFERENTIAL/PLATELET
Abs Immature Granulocytes: 0.02 10*3/uL (ref 0.00–0.07)
Basophils Absolute: 0 10*3/uL (ref 0.0–0.1)
Basophils Relative: 1 %
Eosinophils Absolute: 0 10*3/uL (ref 0.0–0.5)
Eosinophils Relative: 1 %
HCT: 37.5 % — ABNORMAL LOW (ref 39.0–52.0)
Hemoglobin: 12.5 g/dL — ABNORMAL LOW (ref 13.0–17.0)
Immature Granulocytes: 0 %
Lymphocytes Relative: 18 %
Lymphs Abs: 1.2 10*3/uL (ref 0.7–4.0)
MCH: 28.7 pg (ref 26.0–34.0)
MCHC: 33.3 g/dL (ref 30.0–36.0)
MCV: 86 fL (ref 80.0–100.0)
Monocytes Absolute: 0.5 10*3/uL (ref 0.1–1.0)
Monocytes Relative: 7 %
Neutro Abs: 4.9 10*3/uL (ref 1.7–7.7)
Neutrophils Relative %: 73 %
Platelets: 164 10*3/uL (ref 150–400)
RBC: 4.36 MIL/uL (ref 4.22–5.81)
RDW: 15.9 % — ABNORMAL HIGH (ref 11.5–15.5)
WBC: 6.6 10*3/uL (ref 4.0–10.5)
nRBC: 0 % (ref 0.0–0.2)

## 2020-04-09 LAB — COMPREHENSIVE METABOLIC PANEL
ALT: 25 U/L (ref 0–44)
AST: 33 U/L (ref 15–41)
Albumin: 4 g/dL (ref 3.5–5.0)
Alkaline Phosphatase: 59 U/L (ref 38–126)
Anion gap: 7 (ref 5–15)
BUN: 19 mg/dL (ref 8–23)
CO2: 23 mmol/L (ref 22–32)
Calcium: 8.6 mg/dL — ABNORMAL LOW (ref 8.9–10.3)
Chloride: 110 mmol/L (ref 98–111)
Creatinine, Ser: 0.85 mg/dL (ref 0.61–1.24)
GFR calc Af Amer: >60 mL/min (ref >60)
GFR calc non Af Amer: >60 mL/min (ref >60)
Glucose, Bld: 149 mg/dL — ABNORMAL HIGH (ref 70–99)
Potassium: 4 mmol/L (ref 3.5–5.1)
Sodium: 140 mmol/L (ref 135–145)
Total Bilirubin: 0.8 mg/dL (ref 0.3–1.2)
Total Protein: 6.6 g/dL (ref 6.5–8.1)

## 2020-04-09 LAB — PSA: Prostatic Specific Antigen: 0.12 ng/mL (ref 0.00–4.00)

## 2020-04-09 MED ORDER — DENOSUMAB 120 MG/1.7ML ~~LOC~~ SOLN
120.0000 mg | Freq: Once | SUBCUTANEOUS | Status: AC
  Administered 2020-04-09: 120 mg via SUBCUTANEOUS
  Filled 2020-04-09: qty 1.7

## 2020-04-09 MED ORDER — DEGARELIX ACETATE 80 MG ~~LOC~~ SOLR
80.0000 mg | Freq: Once | SUBCUTANEOUS | Status: AC
  Administered 2020-04-09: 80 mg via SUBCUTANEOUS
  Filled 2020-04-09: qty 4

## 2020-04-09 NOTE — Progress Notes (Signed)
Pt and wife in for follow up today.  Pt still having increased frequency during the day and night and not getting much sleep.  Pt has multiple questions regarding his treatment plan, staging and life expectancy as well as how firmagon "works".

## 2020-04-15 ENCOUNTER — Other Ambulatory Visit: Payer: Self-pay | Admitting: Pharmacist

## 2020-04-15 DIAGNOSIS — C61 Malignant neoplasm of prostate: Secondary | ICD-10-CM

## 2020-04-15 MED ORDER — ABIRATERONE ACETATE 250 MG PO TABS: 1000.0000 mg | ORAL_TABLET | Freq: Every day | ORAL | 2 refills | Status: DC

## 2020-05-01 NOTE — Progress Notes (Signed)
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  INTERVAL HISTORY: Patient returns to clinic today for further evaluation and continuation of Xgeva, Firmagon, and Zytiga.  He currently feels well and is asymptomatic.  He is tolerating his treatments without significant side effects.  He does not complain of pain today.  He has no neurologic complaints.  He denies any recent fevers or illnesses.  He has a good appetite and denies weight loss.  He has no chest pain, shortness of breath, cough, or hemoptysis.  He denies any nausea, vomiting, constipation, or diarrhea.  He has nocturia and frequency, but no other urinary complaints.  Patient offers no specific complaints today.  REVIEW OF SYSTEMS:   Review of Systems  Constitutional: Negative.  Negative for fever, malaise/fatigue and weight loss.  Respiratory: Negative.  Negative for cough and hemoptysis.   Cardiovascular: Negative.  Negative for chest pain and leg swelling.  Gastrointestinal: Negative.  Negative for abdominal pain.  Genitourinary: Positive for frequency.  Musculoskeletal: Negative.  Negative for back pain.  Skin: Negative.  Negative for rash.  Neurological: Negative.  Negative for dizziness, focal weakness, weakness and headaches.  Psychiatric/Behavioral: Negative.  The patient is not nervous/anxious.     As per HPI. Otherwise, a complete review of systems is negative.  PAST MEDICAL HISTORY: Past Medical History:  Diagnosis Date  . Chronic constipation   . Elevated prostate specific antigen (PSA)   . Family history of leukemia   . Family history of lung cancer   . History of kidney stones   .  Hyperlipidemia   . Hypertension   . Hypothyroid   . Hypothyroidism   . Paroxysmal A-fib ([place])   . Sinus bradycardia   . Sleep apnea    uses CPAP  . Stroke [facility])     PAST SURGICAL HISTORY: Past Surgical History:  Procedure Laterality Date  . BACK SURGERY  1997   L3, 4, 5 laminectomy   . BACK SURGERY  1998   hard wire removed, fusion of L3-5  . BACK SURGERY  2009   laminotomy/foraminotomy w/ decompression of nerve root, facet thermal ablation L2-3.   Marland Kitchen [place]  2010  . KNEE SURGERY Left 2007   Arthroscopic partial knee replacement, replaced medial side   . MOHS SURGERY Right 2014   squamous cell carcinoma  . NECK SURGERY  2008   fusion with hardwire in place C3 through C7   . pituitary adenoma  1994   benign  . PROSTATE BIOPSY N/A 01/01/2020   Procedure: PROSTATE BIOPSY URO NAV FUSION;  Surgeon: Royston Cowper, MD;  Location: [facility] ORS;  Service: Urology;  Laterality: N/A;  . SPINE SURGERY    . SPINE SURGERY  2016   fusion L4     FAMILY HISTORY: Family History  Problem Relation Age of Onset  . Stroke Mother   . Heart attack Father   . Leukemia Sister 49  . Lung cancer Brother 92    ADVANCED DIRECTIVES (Y/N):  N  HEALTH MAINTENANCE: Social History   Tobacco Use  . Smoking status: Former Research scientist (life sciences)  . Smokeless tobacco: Never Used  . Tobacco comment: quit 50 years ago  Substance Use Topics  . Alcohol use:  Yes    Comment: rarely  . Drug use: Never     Colonoscopy:  PAP:  Bone density:  Lipid panel:  Allergies  Allergen Reactions  . Metoprolol Other (See Comments)    dizziness   . Rivaroxaban Other (See Comments)    hematuria     Current Outpatient Medications  Medication Sig Dispense Refill  . abiraterone acetate (ZYTIGA) 250 MG tablet Take 4 tablets (1,000 mg total) by mouth daily. Take on an empty stomach 1 hour before or 2 hours after a meal 120 tablet 2  . B Complex-C (SUPER B COMPLEX PO) Take by mouth daily.    .  Calcium-Magnesium-Vitamin D (CALCIUM 1200+D3 PO) Take 2 tablets by mouth daily.    . degarelix (FIRMAGON, 240 MG DOSE,) 120 mg injection Inject 120 mg as directed as directed    . denosumab (XGEVA) 120 MG/1.7ML SOLN injection Inject into the skin.    Marland Kitchen dutasteride (AVODART) 0.5 MG capsule Take 0.5 mg by mouth daily.    Marland Kitchen ELIQUIS 5 MG TABS tablet Take 5 mg by mouth 2 (two) times daily.    . hydrocortisone (CORTEF) 10 MG tablet Take 10 mg by mouth 2 (two) times daily.     Marland Kitchen levothyroxine (SYNTHROID) 50 MCG tablet Take 50 mcg by mouth daily before breakfast.    . Misc Natural Products (OSTEO BI-FLEX ADV JOINT SHIELD PO) Take by mouth.    . Multiple Vitamin (MULTIVITAMIN WITH MINERALS) TABS tablet Take 1 tablet by mouth daily. One-A-Day Men's 89+    . polycarbophil (FIBERCON) 625 MG tablet Take 625 mg by mouth daily.    . polyethylene glycol (MIRALAX / GLYCOLAX) 17 g packet Take 17 g by mouth daily.    . Probiotic Product (ALIGN) 4 MG CAPS Take 4 mg by mouth 2 (two) times daily.    . rosuvastatin (CRESTOR) 20 MG tablet Take 20 mg by mouth daily.    . tamsulosin (FLOMAX) 0.4 MG CAPS capsule Take 0.4 mg by mouth daily. Pt taking twice a day now    . valsartan (DIOVAN) 320 MG tablet Take 320 mg by mouth daily.     No current facility-administered medications for this visit.    OBJECTIVE: Vitals:   05/07/20 1010  BP: (!) 145/66  Pulse: (!) 49  Temp: 98 F (36.7 C)     Body mass index is 35.06 kg/m.    ECOG FS:0 - Asymptomatic  General: Well-developed, well-nourished, no acute distress. Eyes: Pink conjunctiva, anicteric sclera. HEENT: Normocephalic, moist mucous membranes. Lungs: No audible wheezing or coughing. Heart: Regular rate and rhythm. Abdomen: Soft, nontender, no obvious distention. Musculoskeletal: No edema, cyanosis, or clubbing. Neuro: Alert, answering all questions appropriately. Cranial nerves grossly intact. Skin: No rashes or petechiae noted. Psych: Normal affect.  LAB  RESULTS:  Lab Results  Component Value Date   NA 140 04/09/2020   K 4.0 04/09/2020   CL 110 04/09/2020   CO2 23 04/09/2020   GLUCOSE 149 (H) 04/09/2020   BUN 19 04/09/2020   CREATININE 0.85 04/09/2020   CALCIUM 8.6 (L) 04/09/2020   PROT 6.6 04/09/2020   ALBUMIN 4.0 04/09/2020   AST 33 04/09/2020   ALT 25 04/09/2020   ALKPHOS 59 04/09/2020   BILITOT 0.8 04/09/2020   GFRNONAA >60 04/09/2020   GFRAA >60 04/09/2020    Lab Results  Component Value Date   WBC 6.6 04/09/2020   NEUTROABS 4.9 04/09/2020   HGB 12.5 (L) 04/09/2020   HCT 37.5 (L) 04/09/2020  MCV 86.0 04/09/2020   PLT 164 04/09/2020     STUDIES: No results found.  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  PLAN:    1. 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  Patient's PSA was initially greater than 8.0, but his most recent result continues to decrease and is now 0.11.  Proceed with [place] Kol[place] and Delton See today.  Continue Zytiga as directed.  Return to clinic in 4 weeks for repeat laboratory work, further evaluation and continuation of Xgeva.  Patient will be transitioned to Eligard at next clinic appointment. 2.  Bony metastasis: Xgeva as above.  Patient's most recent calcium levels are adequate to proceed as scheduled. 3.  Urinary frequency: Appreciate urology input.  Continue Flomax as directed.  Patient has follow-up with urology in the next several weeks.  I spent a total of 30 minutes reviewing chart data, face-to-face evaluation with the patient, counseling and coordination of care as detailed above.   Patient expressed understanding and was in agreement with this plan. He also understands that He can call clinic at any time with any questions, concerns, or complaints.   Cancer Staging Prostate cancer [facility]) Staging form: Prostate, AJCC 8th Edition - Clinical stage from 02/17/2020:  Stage IVB (cT2c, cN0, cM1b, PSA: 8.6, Grade Group: 5) - Signed by Lloyd Huger, MD on 02/17/2020   Lloyd Huger, MD   05/07/2020 12:34 PM

## 2020-05-03 DIAGNOSIS — C61 Malignant neoplasm of prostate: Secondary | ICD-10-CM | POA: Insufficient documentation

## 2020-05-03 DIAGNOSIS — C7951 Secondary malignant neoplasm of bone: Secondary | ICD-10-CM | POA: Insufficient documentation

## 2020-05-04 ENCOUNTER — Encounter: Payer: Self-pay | Admitting: Oncology

## 2020-05-06 ENCOUNTER — Encounter: Payer: Self-pay | Admitting: Oncology

## 2020-05-06 NOTE — Progress Notes (Signed)
Patient would like to discuss course of firmagon. If patient should continue on firmagon. Patient is still having frequent urination and keeps patient awake at night.

## 2020-05-07 ENCOUNTER — Inpatient Hospital Stay: Payer: Medicare Other

## 2020-05-07 ENCOUNTER — Inpatient Hospital Stay: Payer: Medicare Other | Attending: Oncology | Admitting: Oncology

## 2020-05-07 ENCOUNTER — Other Ambulatory Visit: Payer: Self-pay

## 2020-05-07 VITALS — BP 145/66 | HR 49 | Temp 98.0°F | Wt 240.9 lb

## 2020-05-07 DIAGNOSIS — C61 Malignant neoplasm of prostate: Secondary | ICD-10-CM | POA: Diagnosis present

## 2020-05-07 DIAGNOSIS — Z5111 Encounter for antineoplastic chemotherapy: Secondary | ICD-10-CM | POA: Diagnosis present

## 2020-05-07 DIAGNOSIS — C7951 Secondary malignant neoplasm of bone: Secondary | ICD-10-CM | POA: Diagnosis present

## 2020-05-07 MED ORDER — DEGARELIX ACETATE 80 MG ~~LOC~~ SOLR
80.0000 mg | Freq: Once | SUBCUTANEOUS | Status: AC
  Administered 2020-05-07: 80 mg via SUBCUTANEOUS
  Filled 2020-05-07: qty 4

## 2020-05-07 MED ORDER — DENOSUMAB 120 MG/1.7ML ~~LOC~~ SOLN
120.0000 mg | Freq: Once | SUBCUTANEOUS | Status: AC
  Administered 2020-05-07: 120 mg via SUBCUTANEOUS
  Filled 2020-05-07: qty 1.7

## 2020-05-07 NOTE — Progress Notes (Signed)
Ca =8.6 from [facility] ([facility]) on 04/26/20.  Ok to go with this lab value per MD to give xgeva today.

## 2020-05-19 ENCOUNTER — Encounter: Payer: Self-pay | Admitting: Urology

## 2020-05-19 ENCOUNTER — Ambulatory Visit (INDEPENDENT_AMBULATORY_CARE_PROVIDER_SITE_OTHER): Payer: Medicare Other | Admitting: Urology

## 2020-05-19 ENCOUNTER — Other Ambulatory Visit: Payer: Self-pay

## 2020-05-19 VITALS — BP 118/60 | HR 60 | Ht 70.0 in | Wt 240.0 lb

## 2020-05-19 DIAGNOSIS — R399 Unspecified symptoms and signs involving the genitourinary system: Secondary | ICD-10-CM | POA: Insufficient documentation

## 2020-05-19 DIAGNOSIS — C61 Malignant neoplasm of prostate: Secondary | ICD-10-CM

## 2020-05-19 LAB — BLADDER SCAN AMB NON-IMAGING

## 2020-05-19 NOTE — Patient Instructions (Signed)

## 2020-05-19 NOTE — Progress Notes (Signed)
   05/19/2020 2:05 PM   Andrew Callahan [birth year] [id number]  Reason for visit: Follow up metastatic prostate cancer, urinary symptoms  HPI: I saw Andrew Callahan and his wife back in urology clinic today for his metastatic prostate cancer and urinary symptoms.  He is an 86 year old male with metastatic prostate cancer with intraductal features being managed by oncology, as well as a history of urinary retention approximately 6 months ago when he was initially diagnosed.  I have been following him for bothersome urinary symptoms.  He continues to have bothersome urinary complaints of urgency, frequency, occasional incontinence, and nocturia every 2 hours overnight despite maximal medical therapy with 0.8 mg tamsulosin and dutasteride, as well as ADT for his prostate cancer.  We had previously discussed behavioral strategies at length, but he continues to be very frustrated by his urinary symptoms.  His PVR today is normal at 0 mL.  IPSS score today is 17, with quality of life terrible.  He is strongly interested in surgical options for improving his urination.  We another long conversation today about his prostate cancer and urinary symptoms and treatment options ranging from addition of an OAB medication like Myrbetriq, the surgical options like HOLEP or TURP.  We discussed the risks and benefits at length.  I was very honest with the patient that his symptoms are relatively mild and there is a chance that with an outlet procedure his overactive symptoms may worsen, certainly at least temporarily.  He is adamant that he would like to pursue an outlet procedure.  We discussed the risks and benefits of HoLEP at length.  The procedure requires general anesthesia and takes 2 to 3 hours, and a holmium laser is used to enucleate the prostate and push this tissue into the bladder.  A morcellator is then used to remove this tissue, which is sent for pathology.  The vast majority of patients are able to discharge the  same day with a catheter in place for 2 to 3 days, and will follow-up in clinic for a voiding trial.  Approximately 5% of patients will be admitted overnight to monitor the urine, or if they have multiple Callahan-morbidities.  We specifically discussed the risks of bleeding, infection, retrograde ejaculation, temporary urgency and urge incontinence, very low risk of long-term incontinence, pathologic evaluation of prostate tissue and possible detection of prostate cancer or other malignancy, and possible need for additional procedures.  Trial of Myrbetriq 50 mg daily Virtual visit 3 weeks to discuss symptoms We will obtain Andrew Callahan outside cystoscopy note Consider HoLEP in 4 to 6 weeks if persistent symptoms despite trial of Myrbetriq   I spent 30 total minutes on the day of the encounter including pre-visit review of the medical record, face-to-face time with the patient, and post visit ordering of labs/imaging/tests.  Andrew Callahan, [facility] [address] [place], [place] [address] [phone]

## 2020-05-20 ENCOUNTER — Other Ambulatory Visit: Payer: Self-pay | Admitting: Radiology

## 2020-05-20 DIAGNOSIS — R399 Unspecified symptoms and signs involving the genitourinary system: Secondary | ICD-10-CM

## 2020-05-20 DIAGNOSIS — C61 Malignant neoplasm of prostate: Secondary | ICD-10-CM

## 2020-05-30 NOTE — Progress Notes (Signed)
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  INTERVAL HISTORY: Patient returns to clinic today for further evaluation and continuation of [name].  He is initiating Eligard today.  He continues to feel well and remains asymptomatic. He is tolerating his treatments without significant side effects.  He does not complain of pain today.  He has no neurologic complaints.  He denies any recent fevers or illnesses.  He has a good appetite and denies weight loss.  He has no chest pain, shortness of breath, cough, or hemoptysis.  He denies any nausea, vomiting, constipation, or diarrhea.  He continues to have nocturia and frequency, but no other urinary complaints.  Patient offers no further specific complaints today.  REVIEW OF SYSTEMS:   Review of Systems  Constitutional: Negative.  Negative for fever, malaise/fatigue and weight loss.  Respiratory: Negative.  Negative for cough and hemoptysis.   Cardiovascular: Negative.  Negative for chest pain and leg swelling.  Gastrointestinal: Negative.  Negative for abdominal pain.  Genitourinary: Positive for frequency.  Musculoskeletal: Negative.  Negative for back pain.  Skin: Negative.  Negative for rash.  Neurological: Negative.  Negative for dizziness, focal weakness, weakness and headaches.  Psychiatric/Behavioral: Negative.  The patient is not nervous/anxious.     As per HPI. Otherwise, a complete review of systems is negative.  PAST MEDICAL HISTORY: Past Medical History:  Diagnosis Date  . Chronic constipation   . Elevated prostate specific antigen  (PSA)   . Family history of leukemia   . Family history of lung cancer   . History of kidney stones   . Hyperlipidemia   . Hypertension   . Hypothyroid   . Hypothyroidism   . Paroxysmal A-fib ([place])   . Sinus bradycardia   . Sleep apnea    uses CPAP  . Stroke [facility])     PAST SURGICAL HISTORY: Past Surgical History:  Procedure Laterality Date  . BACK SURGERY  1997   L3, 4, 5 laminectomy   . BACK SURGERY  1998   hard wire removed, fusion of L3-5  . BACK SURGERY  2009   laminotomy/foraminotomy w/ decompression of nerve root, facet thermal ablation L2-3.   Marland Kitchen [place]  2010  . KNEE SURGERY Left 2007   Arthroscopic partial knee replacement, replaced medial side   . MOHS SURGERY Right 2014   squamous cell carcinoma  . NECK SURGERY  2008   fusion with hardwire in place C3 through C7   . pituitary adenoma  1994   benign  . PROSTATE BIOPSY N/A 01/01/2020   Procedure: PROSTATE BIOPSY URO NAV FUSION;  Surgeon: Royston Cowper, MD;  Location: [facility] ORS;  Service: Urology;  Laterality: N/A;  . SPINE SURGERY    . SPINE SURGERY  2016   fusion L4     FAMILY HISTORY: Family History  Problem Relation Age of Onset  . Stroke Mother   . Heart attack Father   . Leukemia Sister 23  . Lung cancer Brother 45    ADVANCED DIRECTIVES (Y/N):  N  HEALTH MAINTENANCE: Social History   Tobacco Use  . Smoking status: Former Research scientist (life sciences)  . Smokeless tobacco: Never Used  .  Tobacco comment: quit 50 years ago  Vaping Use  . Vaping Use: Never used  Substance Use Topics  . Alcohol use: Yes    Comment: rarely  . Drug use: Never     Colonoscopy:  PAP:  Bone density:  Lipid panel:  Allergies  Allergen Reactions  . Metoprolol Other (See Comments)    dizziness   . Rivaroxaban Other (See Comments)    hematuria     Current Outpatient Medications  Medication Sig Dispense Refill  . abiraterone acetate (ZYTIGA) 250 MG tablet Take 4 tablets (1,000 mg total) by mouth daily. Take on  an empty stomach 1 hour before or 2 hours after a meal (Patient taking differently: Take 1,000 mg by mouth daily at 10 pm. Take on an empty stomach 1 hour before or 2 hours after a meal) 120 tablet 2  . B Complex-C (SUPER B COMPLEX PO) Take 1 capsule by mouth daily.     . Calcium-Magnesium-Vitamin D (CALCIUM 1200+D3 PO) Take 2 tablets by mouth daily.    . cetirizine (ZYRTEC) 10 MG tablet Take 10 mg by mouth at bedtime.    . degarelix (FIRMAGON, 240 MG DOSE,) 120 mg injection Inject 120 mg into the skin every 28 (twenty-eight) days.     Marland Kitchen denosumab (XGEVA) 120 MG/1.7ML SOLN injection Inject 120 mg into the skin every 28 (twenty-eight) days.     Marland Kitchen dutasteride (AVODART) 0.5 MG capsule Take 0.5 mg by mouth at bedtime.     Marland Kitchen ELIQUIS 5 MG TABS tablet Take 5 mg by mouth 2 (two) times daily.    . hydrocortisone (CORTEF) 10 MG tablet Take 10 mg by mouth 2 (two) times daily.     Marland Kitchen levothyroxine (SYNTHROID) 50 MCG tablet Take 50 mcg by mouth daily before breakfast.    . mirabegron ER (MYRBETRIQ) 25 MG TB24 tablet Take 25 mg by mouth daily.    . Misc Natural Products (OSTEO BI-FLEX ADV JOINT SHIELD PO) Take 1 tablet by mouth daily.     . Multiple Vitamin (MULTIVITAMIN WITH MINERALS) TABS tablet Take 1 tablet by mouth daily. One-A-Day Men's 73+    . NON FORMULARY Take 1 capsule by mouth at bedtime. Pure Encapsulations MotilPro    . polycarbophil (FIBERCON) 625 MG tablet Take 625 mg by mouth daily.    . polyethylene glycol (MIRALAX / GLYCOLAX) 17 g packet Take 17 g by mouth daily. W/coffee    . Probiotic Product (ALIGN) 4 MG CAPS Take 4 mg by mouth 2 (two) times daily.    . rosuvastatin (CRESTOR) 20 MG tablet Take 20 mg by mouth daily.    . tamsulosin (FLOMAX) 0.4 MG CAPS capsule Take 2 capsules (0.8 mg total) by mouth daily after supper. 30 capsule 3  . valsartan (DIOVAN) 320 MG tablet Take 320 mg by mouth daily.     No current facility-administered medications for this visit.    OBJECTIVE: Vitals:    06/04/20 1034  BP: (!) 159/75  Pulse: (!) 53  Temp: 98 F (36.7 C)  SpO2: 100%     Body mass index is 34.92 kg/m.    ECOG FS:0 - Asymptomatic  General: Well-developed, well-nourished, no acute distress. Eyes: Pink conjunctiva, anicteric sclera. HEENT: Normocephalic, moist mucous membranes. Lungs: No audible wheezing or coughing. Heart: Regular rate and rhythm. Abdomen: Soft, nontender, no obvious distention. Musculoskeletal: No edema, cyanosis, or clubbing. Neuro: Alert, answering all questions appropriately. Cranial nerves grossly intact. Skin: No rashes or petechiae noted. Psych: Normal affect.  LAB RESULTS:  Lab Results  Component Value Date   NA 140 06/04/2020   K 4.1 06/04/2020   CL 107 06/04/2020   CO2 23 06/04/2020   GLUCOSE 137 (H) 06/04/2020   BUN 22 06/04/2020   CREATININE 0.86 06/04/2020   CALCIUM 8.8 (L) 06/04/2020   PROT 6.6 06/04/2020   ALBUMIN 3.8 06/04/2020   AST 93 (H) 06/04/2020   ALT 99 (H) 06/04/2020   ALKPHOS 54 06/04/2020   BILITOT 0.8 06/04/2020   GFRNONAA >60 06/04/2020   GFRAA >60 06/04/2020    Lab Results  Component Value Date   WBC 7.7 06/04/2020   NEUTROABS 5.6 06/04/2020   HGB 11.3 (L) 06/04/2020   HCT 33.4 (L) 06/04/2020   MCV 86.5 06/04/2020   PLT 171 06/04/2020     STUDIES: No results found.  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  PLAN:    1. 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  Patient's PSA was initially greater than 8.0, but his most recent result continues to trend down is now 0.07.  We will transition Firmagon to [employer] every 6 months.  Proceed with Xgeva today. Continue Zytiga as directed.  Return to clinic in 4 weeks for repeat laboratory work, further evaluation, and continuation of Xgeva. 2.  Bony metastasis: Xgeva as above.  Patient's most recent calcium levels are adequate to  proceed as scheduled. 3.  Urinary frequency: Appreciate urology input.  Continue Flomax as directed.  Follow-up with urology as directed.  I spent a total of 30 minutes reviewing chart data, face-to-face evaluation with the patient, counseling and coordination of care as detailed above.  Patient expressed understanding and was in agreement with this plan. He also understands that He can call clinic at any time with any questions, concerns, or complaints.   Cancer Staging Prostate cancer [facility]) Staging form: Prostate, AJCC 8th Edition - Clinical stage from 02/17/2020: Stage IVB (cT2c, cN0, cM1b, PSA: 8.6, Grade Group: 5) - Signed by Lloyd Huger, MD on 02/17/2020   Lloyd Huger, MD   06/05/2020 8:42 AM

## 2020-05-31 ENCOUNTER — Other Ambulatory Visit: Payer: Self-pay

## 2020-05-31 MED ORDER — TAMSULOSIN HCL 0.4 MG PO CAPS: 0.8000 mg | ORAL_CAPSULE | Freq: Every day | ORAL | 3 refills | Status: DC

## 2020-06-04 ENCOUNTER — Other Ambulatory Visit: Payer: Self-pay

## 2020-06-04 ENCOUNTER — Encounter: Payer: Self-pay | Admitting: Oncology

## 2020-06-04 ENCOUNTER — Inpatient Hospital Stay: Payer: Medicare Other | Attending: Oncology

## 2020-06-04 ENCOUNTER — Inpatient Hospital Stay: Payer: Medicare Other

## 2020-06-04 ENCOUNTER — Inpatient Hospital Stay (HOSPITAL_BASED_OUTPATIENT_CLINIC_OR_DEPARTMENT_OTHER): Payer: Medicare Other | Admitting: Oncology

## 2020-06-04 VITALS — BP 159/75 | HR 53 | Temp 98.0°F | Wt 243.4 lb

## 2020-06-04 DIAGNOSIS — C61 Malignant neoplasm of prostate: Secondary | ICD-10-CM

## 2020-06-04 DIAGNOSIS — Z5111 Encounter for antineoplastic chemotherapy: Secondary | ICD-10-CM | POA: Diagnosis present

## 2020-06-04 DIAGNOSIS — C7951 Secondary malignant neoplasm of bone: Secondary | ICD-10-CM | POA: Diagnosis present

## 2020-06-04 LAB — COMPREHENSIVE METABOLIC PANEL
ALT: 99 U/L — ABNORMAL HIGH (ref 0–44)
AST: 93 U/L — ABNORMAL HIGH (ref 15–41)
Albumin: 3.8 g/dL (ref 3.5–5.0)
Alkaline Phosphatase: 54 U/L (ref 38–126)
Anion gap: 10 (ref 5–15)
BUN: 22 mg/dL (ref 8–23)
CO2: 23 mmol/L (ref 22–32)
Calcium: 8.8 mg/dL — ABNORMAL LOW (ref 8.9–10.3)
Chloride: 107 mmol/L (ref 98–111)
Creatinine, Ser: 0.86 mg/dL (ref 0.61–1.24)
GFR calc Af Amer: >60 mL/min (ref >60)
GFR calc non Af Amer: >60 mL/min (ref >60)
Glucose, Bld: 137 mg/dL — ABNORMAL HIGH (ref 70–99)
Potassium: 4.1 mmol/L (ref 3.5–5.1)
Sodium: 140 mmol/L (ref 135–145)
Total Bilirubin: 0.8 mg/dL (ref 0.3–1.2)
Total Protein: 6.6 g/dL (ref 6.5–8.1)

## 2020-06-04 LAB — CBC WITH DIFFERENTIAL/PLATELET
Abs Immature Granulocytes: 0.06 10*3/uL (ref 0.00–0.07)
Basophils Absolute: 0 10*3/uL (ref 0.0–0.1)
Basophils Relative: 0 %
Eosinophils Absolute: 0 10*3/uL (ref 0.0–0.5)
Eosinophils Relative: 1 %
HCT: 33.4 % — ABNORMAL LOW (ref 39.0–52.0)
Hemoglobin: 11.3 g/dL — ABNORMAL LOW (ref 13.0–17.0)
Immature Granulocytes: 1 %
Lymphocytes Relative: 18 %
Lymphs Abs: 1.4 10*3/uL (ref 0.7–4.0)
MCH: 29.3 pg (ref 26.0–34.0)
MCHC: 33.8 g/dL (ref 30.0–36.0)
MCV: 86.5 fL (ref 80.0–100.0)
Monocytes Absolute: 0.6 10*3/uL (ref 0.1–1.0)
Monocytes Relative: 8 %
Neutro Abs: 5.6 10*3/uL (ref 1.7–7.7)
Neutrophils Relative %: 72 %
Platelets: 171 10*3/uL (ref 150–400)
RBC: 3.86 MIL/uL — ABNORMAL LOW (ref 4.22–5.81)
RDW: 15.7 % — ABNORMAL HIGH (ref 11.5–15.5)
WBC: 7.7 10*3/uL (ref 4.0–10.5)
nRBC: 0 % (ref 0.0–0.2)

## 2020-06-04 LAB — PSA: Prostatic Specific Antigen: 0.07 ng/mL (ref 0.00–4.00)

## 2020-06-04 MED ORDER — LEUPROLIDE ACETATE (6 MONTH) 45 MG ~~LOC~~ KIT
45.0000 mg | PACK | Freq: Once | SUBCUTANEOUS | Status: AC
  Administered 2020-06-04: 45 mg via SUBCUTANEOUS
  Filled 2020-06-04: qty 45

## 2020-06-04 MED ORDER — DENOSUMAB 120 MG/1.7ML ~~LOC~~ SOLN
120.0000 mg | Freq: Once | SUBCUTANEOUS | Status: AC
  Administered 2020-06-04: 120 mg via SUBCUTANEOUS
  Filled 2020-06-04: qty 1.7

## 2020-06-04 NOTE — Progress Notes (Signed)
Patient here today for follow up. Would like to discuss when he will have a bone scan again. He also reports he is having some hot flashes in the morning. Denies any pain. Reports feeling tired because of frequent trips to bathroom at night.

## 2020-06-08 ENCOUNTER — Other Ambulatory Visit: Payer: Self-pay | Admitting: Urology

## 2020-06-09 ENCOUNTER — Telehealth (INDEPENDENT_AMBULATORY_CARE_PROVIDER_SITE_OTHER): Payer: Medicare Other | Admitting: Urology

## 2020-06-09 ENCOUNTER — Other Ambulatory Visit: Payer: Self-pay

## 2020-06-09 DIAGNOSIS — N320 Bladder-neck obstruction: Secondary | ICD-10-CM | POA: Diagnosis not present

## 2020-06-09 DIAGNOSIS — R399 Unspecified symptoms and signs involving the genitourinary system: Secondary | ICD-10-CM

## 2020-06-09 DIAGNOSIS — C61 Malignant neoplasm of prostate: Secondary | ICD-10-CM | POA: Diagnosis not present

## 2020-06-09 NOTE — Progress Notes (Addendum)
Virtual Visit via Video Note  I connected with Garnell Phenix on 06/09/20 at  1:00 PM EDT by Epic video and audio chat and verified that I am speaking with the correct person using two identifiers.   I discussed the limitations, risks, security and privacy concerns of performing an evaluation and management service by telephone and the availability of in person appointments. We discussed the impact of the COVID-19 pandemic on the healthcare system, and the importance of social distancing and reducing patient and provider exposure. I also discussed with the patient that there may be a patient responsible charge related to this service. The patient expressed understanding and agreed to proceed.  Reason for visit: Prostate cancer, bladder outlet obstruction  History of Present Illness: Briefly, he is an 86 year old male with metastatic prostate cancer including both adenocarcinoma with intraductal features being managed by oncology, as well as worsening urinary symptoms despite ADT, Flomax, and finasteride.  Prostate measured 70 g on prior prostate MRI.  He does have a history of urinary retention in the last 6 months.  His PVRs have been mildly elevated in clinic.  Despite the above medications, he has persistent bothersome urinary symptoms of weak stream, and primarily nocturia 4-5 times per night.  Most recently, we tried a 1 month course of Myrbetriq to see if this improved any of his frequency, but he did not notice any significant improvement.  We discussed the risks and benefits of HoLEP at length.  The procedure requires general anesthesia and takes 2 to 3 hours, and a holmium laser is used to enucleate the prostate and push this tissue into the bladder.  A morcellator is then used to remove this tissue, which is sent for pathology.  The vast majority of patients are able to discharge the same day with a catheter in place for 2 to 3 days, and will follow-up in clinic for a voiding trial.   Approximately 5% of patients will be admitted overnight to monitor the urine, or if they have multiple co-morbidities.  We specifically discussed the risks of bleeding, infection, retrograde ejaculation, temporary urgency and urge incontinence, very low risk of long-term incontinence, and possible need for additional procedures.  We also discussed the possible conversion to a channel bipolar TURP if planes are grossly abnormal secondary to prostate cancer.  Follow Up: Keep scheduled HoLEP next week, will need cardiac clearance to hold anticoagulation   I discussed the assessment and treatment plan with the patient. The patient was provided an opportunity to ask questions and all were answered. The patient agreed with the plan and demonstrated an understanding of the instructions.   The patient was advised to call back or seek an in-person evaluation if the symptoms worsen or if the condition fails to improve as anticipated.  I provided 12 minutes of non-face-to-face time during this encounter.   Billey Co, MD

## 2020-06-10 ENCOUNTER — Other Ambulatory Visit: Payer: Medicare Other

## 2020-06-10 ENCOUNTER — Other Ambulatory Visit: Payer: Self-pay

## 2020-06-10 ENCOUNTER — Other Ambulatory Visit
Admission: RE | Admit: 2020-06-10 | Discharge: 2020-06-10 | Disposition: A | Discharge status: Home / Self Care | Payer: Medicare Other | Source: Ambulatory Visit | Attending: Urology | Admitting: Urology

## 2020-06-10 ENCOUNTER — Encounter: Payer: Self-pay | Admitting: Urgent Care

## 2020-06-10 ENCOUNTER — Encounter
Admission: RE | Admit: 2020-06-10 | Discharge: 2020-06-10 | Disposition: A | Discharge status: Home / Self Care | Payer: Medicare Other | Source: Ambulatory Visit | Attending: Urology | Admitting: Urology

## 2020-06-10 DIAGNOSIS — I629 Nontraumatic intracranial hemorrhage, unspecified: Secondary | ICD-10-CM | POA: Insufficient documentation

## 2020-06-10 DIAGNOSIS — I10 Essential (primary) hypertension: Secondary | ICD-10-CM | POA: Insufficient documentation

## 2020-06-10 DIAGNOSIS — Z01810 Encounter for preprocedural cardiovascular examination: Secondary | ICD-10-CM | POA: Insufficient documentation

## 2020-06-10 DIAGNOSIS — R399 Unspecified symptoms and signs involving the genitourinary system: Secondary | ICD-10-CM

## 2020-06-10 NOTE — Patient Instructions (Signed)
Your procedure is scheduled on: 7/9/251 Report to [place]. To find out your arrival time please call [phone] between 1PM - 3PM on 06/17/20.  Remember: Instructions that are not followed completely may result in serious medical risk, up to and including death, or upon the discretion of your surgeon and anesthesiologist your surgery may need to be rescheduled.     _X__ 1. Do not eat food after midnight the night before your procedure.                 No gum chewing or hard candies. You may drink clear liquids up to 2 hours                 before you are scheduled to arrive for your surgery- DO not drink clear                 liquids within 2 hours of the start of your surgery.                 Clear Liquids include:  water, apple juice without pulp, clear carbohydrate                 drink such as Clearfast or Gatorade, Black Coffee or Tea (Do not add                 anything to coffee or tea). Diabetics water only  __X__2.  On the morning of surgery brush your teeth with toothpaste and water, you                 may rinse your mouth with mouthwash if you wish.  Do not swallow any              toothpaste of mouthwash.     _X__ 3.  No Alcohol for 24 hours before or after surgery.   _X__ 4.  Do Not Smoke or use e-cigarettes For 24 Hours Prior to Your Surgery.                 Do not use any chewable tobacco products for at least 6 hours prior to                 surgery.  ____  5.  Bring all medications with you on the day of surgery if instructed.   __X__  6.  Notify your doctor if there is any change in your medical condition      (cold, fever, infections).     Do not wear jewelry, make-up, hairpins, clips or nail polish. Do not wear lotions, powders, or perfumes.  Do not shave 48 hours prior to surgery. Men may shave face and neck. Do not bring valuables to the hospital.    [facility] is not responsible for any belongings or  valuables.  Contacts, dentures/partials or body piercings may not be worn into surgery. Bring a case for your contacts, glasses or hearing aids, a denture cup will be supplied. Leave your suitcase in the car. After surgery it may be brought to your room. For patients admitted to the hospital, discharge time is determined by your treatment team.   Patients discharged the day of surgery will not be allowed to drive home.   Please read over the following fact sheets that you were given:   MRSA Information  __X__ Take these medicines the morning of surgery with A SIP OF WATER:  1. hydrocortisone (CORTEF) 10 MG tablet  2. levothyroxine (SYNTHROID) 50 MCG tablet  3. rosuvastatin (CRESTOR) 20 MG tablet  4.  5.  6.  ____ Fleet Enema (as directed)   __X__ Use CHG Soap/SAGE wipes as directed  ____ Use inhalers on the day of surgery  ____ Stop metformin/Janumet/Farxiga 2 days prior to surgery    ____ Take 1/2 of usual insulin dose the night before surgery. No insulin the morning          of surgery.   __X__ Stop Blood Thinners Coumadin/Plavix/Xarelto/Pleta/Pradaxa/Eliquis/Effient/Aspirin  on   Or contact your Surgeon, Cardiologist or Medical Doctor regarding  ability to stop your blood thinners  __X__ Stop Anti-inflammatories 7 days before surgery such as Advil, Ibuprofen, Motrin,  BC or Goodies Powder, Naprosyn, Naproxen, Aleve, Aspirin    __X__ Stop all herbal supplements, fish oil or vitamin E until after surgery.    ____ Bring C-Pap to the hospital.   [place] 06/10/20

## 2020-06-11 LAB — URINALYSIS, COMPLETE
Bilirubin, UA: NEGATIVE
Glucose, UA: NEGATIVE
Ketones, UA: NEGATIVE
Leukocytes,UA: NEGATIVE
Nitrite, UA: NEGATIVE
Protein,UA: NEGATIVE
RBC, UA: NEGATIVE
Specific Gravity, UA: 1.02 (ref 1.005–1.030)
Urobilinogen, Ur: 0.2 mg/dL (ref 0.2–1.0)
pH, UA: 6 (ref 5.0–7.5)

## 2020-06-11 LAB — MICROSCOPIC EXAMINATION

## 2020-06-14 LAB — CULTURE, URINE COMPREHENSIVE

## 2020-06-16 ENCOUNTER — Other Ambulatory Visit: Payer: Self-pay

## 2020-06-16 ENCOUNTER — Other Ambulatory Visit
Admission: RE | Admit: 2020-06-16 | Discharge: 2020-06-16 | Disposition: A | Discharge status: Home / Self Care | Payer: Medicare Other | Source: Ambulatory Visit | Attending: Urology | Admitting: Urology

## 2020-06-16 ENCOUNTER — Other Ambulatory Visit: Admission: RE | Admit: 2020-06-16 | Payer: Medicare Other | Source: Ambulatory Visit

## 2020-06-16 DIAGNOSIS — Z01812 Encounter for preprocedural laboratory examination: Secondary | ICD-10-CM | POA: Diagnosis present

## 2020-06-16 DIAGNOSIS — Z20822 Contact with and (suspected) exposure to covid-19: Secondary | ICD-10-CM | POA: Insufficient documentation

## 2020-06-16 LAB — SARS CORONAVIRUS 2 (TAT 6-24 HRS): SARS Coronavirus 2: NEGATIVE

## 2020-06-18 ENCOUNTER — Ambulatory Visit: Payer: Medicare Other | Admitting: Certified Registered"

## 2020-06-18 ENCOUNTER — Encounter
Admission: RE | Disposition: A | Discharge status: Home / Self Care | Payer: Self-pay | Source: Home / Self Care | Attending: Urology

## 2020-06-18 ENCOUNTER — Encounter: Payer: Self-pay | Admitting: Urology

## 2020-06-18 ENCOUNTER — Other Ambulatory Visit: Payer: Self-pay

## 2020-06-18 ENCOUNTER — Ambulatory Visit
Admission: RE | Admit: 2020-06-18 | Discharge: 2020-06-18 | Disposition: A | Discharge status: Home / Self Care | Payer: Medicare Other | Attending: Urology | Admitting: Urology

## 2020-06-18 DIAGNOSIS — R3912 Poor urinary stream: Secondary | ICD-10-CM | POA: Diagnosis not present

## 2020-06-18 DIAGNOSIS — Z981 Arthrodesis status: Secondary | ICD-10-CM | POA: Diagnosis not present

## 2020-06-18 DIAGNOSIS — R339 Retention of urine, unspecified: Secondary | ICD-10-CM | POA: Diagnosis not present

## 2020-06-18 DIAGNOSIS — N320 Bladder-neck obstruction: Secondary | ICD-10-CM | POA: Insufficient documentation

## 2020-06-18 DIAGNOSIS — G4730 Sleep apnea, unspecified: Secondary | ICD-10-CM | POA: Diagnosis not present

## 2020-06-18 DIAGNOSIS — R351 Nocturia: Secondary | ICD-10-CM | POA: Diagnosis not present

## 2020-06-18 DIAGNOSIS — C61 Malignant neoplasm of prostate: Secondary | ICD-10-CM | POA: Insufficient documentation

## 2020-06-18 DIAGNOSIS — I10 Essential (primary) hypertension: Secondary | ICD-10-CM | POA: Diagnosis not present

## 2020-06-18 DIAGNOSIS — I480 Paroxysmal atrial fibrillation: Secondary | ICD-10-CM | POA: Diagnosis not present

## 2020-06-18 DIAGNOSIS — Z79899 Other long term (current) drug therapy: Secondary | ICD-10-CM | POA: Diagnosis not present

## 2020-06-18 DIAGNOSIS — Z87891 Personal history of nicotine dependence: Secondary | ICD-10-CM | POA: Insufficient documentation

## 2020-06-18 DIAGNOSIS — R399 Unspecified symptoms and signs involving the genitourinary system: Secondary | ICD-10-CM

## 2020-06-18 DIAGNOSIS — Z8673 Personal history of transient ischemic attack (TIA), and cerebral infarction without residual deficits: Secondary | ICD-10-CM | POA: Insufficient documentation

## 2020-06-18 DIAGNOSIS — I2510 Atherosclerotic heart disease of native coronary artery without angina pectoris: Secondary | ICD-10-CM | POA: Insufficient documentation

## 2020-06-18 HISTORY — PX: HOLEP-LASER ENUCLEATION OF THE PROSTATE WITH MORCELLATION: SHX6641

## 2020-06-18 SURGERY — ENUCLEATION, PROSTATE, USING LASER, WITH MORCELLATION
Anesthesia: General | Site: Prostate

## 2020-06-18 MED ORDER — FENTANYL CITRATE (PF) 100 MCG/2ML IJ SOLN
INTRAMUSCULAR | Status: AC
  Filled 2020-06-18: qty 2

## 2020-06-18 MED ORDER — SUCCINYLCHOLINE CHLORIDE 20 MG/ML IJ SOLN
INTRAMUSCULAR | Status: DC | PRN
  Administered 2020-06-18: 100 mg via INTRAVENOUS

## 2020-06-18 MED ORDER — ROCURONIUM BROMIDE 10 MG/ML (PF) SYRINGE
PREFILLED_SYRINGE | INTRAVENOUS | Status: AC
  Filled 2020-06-18: qty 10

## 2020-06-18 MED ORDER — FENTANYL CITRATE (PF) 100 MCG/2ML IJ SOLN
INTRAMUSCULAR | Status: DC | PRN
  Administered 2020-06-18 (×2): 50 ug via INTRAVENOUS

## 2020-06-18 MED ORDER — LIDOCAINE HCL (CARDIAC) PF 100 MG/5ML IV SOSY
PREFILLED_SYRINGE | INTRAVENOUS | Status: DC | PRN
  Administered 2020-06-18: 100 mg via INTRAVENOUS

## 2020-06-18 MED ORDER — ORAL CARE MOUTH RINSE: 15.0000 mL | Freq: Once | OROMUCOSAL | Status: AC

## 2020-06-18 MED ORDER — ONDANSETRON HCL 4 MG/2ML IJ SOLN: 4.0000 mg | Freq: Once | INTRAMUSCULAR | Status: DC | PRN

## 2020-06-18 MED ORDER — ONDANSETRON HCL 4 MG/2ML IJ SOLN
INTRAMUSCULAR | Status: DC | PRN
  Administered 2020-06-18: 4 mg via INTRAVENOUS

## 2020-06-18 MED ORDER — FAMOTIDINE 20 MG PO TABS
20.0000 mg | ORAL_TABLET | Freq: Once | ORAL | Status: AC
  Administered 2020-06-18: 20 mg via ORAL

## 2020-06-18 MED ORDER — HYDROCODONE-ACETAMINOPHEN 5-325 MG PO TABS: 1.0000 | ORAL_TABLET | Freq: Four times a day (QID) | ORAL | 0 refills | Status: AC | PRN

## 2020-06-18 MED ORDER — CHLORHEXIDINE GLUCONATE 0.12 % MT SOLN
15.0000 mL | Freq: Once | OROMUCOSAL | Status: AC
  Administered 2020-06-18: 15 mL via OROMUCOSAL

## 2020-06-18 MED ORDER — EPHEDRINE 5 MG/ML INJ
INTRAVENOUS | Status: AC
  Filled 2020-06-18: qty 10

## 2020-06-18 MED ORDER — PHENYLEPHRINE HCL (PRESSORS) 10 MG/ML IV SOLN
INTRAVENOUS | Status: AC
  Filled 2020-06-18: qty 1

## 2020-06-18 MED ORDER — CHLORHEXIDINE GLUCONATE 0.12 % MT SOLN
OROMUCOSAL | Status: AC
  Filled 2020-06-18: qty 15

## 2020-06-18 MED ORDER — LACTATED RINGERS IV SOLN: INTRAVENOUS | Status: DC

## 2020-06-18 MED ORDER — ONDANSETRON HCL 4 MG/2ML IJ SOLN
INTRAMUSCULAR | Status: AC
  Filled 2020-06-18: qty 2

## 2020-06-18 MED ORDER — PROPOFOL 10 MG/ML IV BOLUS
INTRAVENOUS | Status: AC
  Filled 2020-06-18: qty 40

## 2020-06-18 MED ORDER — CEFAZOLIN SODIUM-DEXTROSE 2-4 GM/100ML-% IV SOLN
INTRAVENOUS | Status: AC
  Filled 2020-06-18: qty 100

## 2020-06-18 MED ORDER — FAMOTIDINE 20 MG PO TABS
ORAL_TABLET | ORAL | Status: AC
  Filled 2020-06-18: qty 1

## 2020-06-18 MED ORDER — SUGAMMADEX SODIUM 200 MG/2ML IV SOLN
INTRAVENOUS | Status: DC | PRN
  Administered 2020-06-18: 200 mg via INTRAVENOUS

## 2020-06-18 MED ORDER — FENTANYL CITRATE (PF) 100 MCG/2ML IJ SOLN
25.0000 ug | INTRAMUSCULAR | Status: DC | PRN
  Administered 2020-06-18: 25 ug via INTRAVENOUS

## 2020-06-18 MED ORDER — EPHEDRINE SULFATE 50 MG/ML IJ SOLN
INTRAMUSCULAR | Status: DC | PRN
  Administered 2020-06-18 (×2): 7.5 mg via INTRAVENOUS
  Administered 2020-06-18: 10 mg via INTRAVENOUS
  Administered 2020-06-18: 7.5 mg via INTRAVENOUS

## 2020-06-18 MED ORDER — PROPOFOL 10 MG/ML IV BOLUS
INTRAVENOUS | Status: DC | PRN
  Administered 2020-06-18: 120 mg via INTRAVENOUS

## 2020-06-18 MED ORDER — BELLADONNA ALKALOIDS-OPIUM 16.2-60 MG RE SUPP
RECTAL | Status: DC | PRN
  Administered 2020-06-18: 1 via RECTAL

## 2020-06-18 MED ORDER — BELLADONNA ALKALOIDS-OPIUM 16.2-60 MG RE SUPP
RECTAL | Status: AC
  Filled 2020-06-18: qty 1

## 2020-06-18 MED ORDER — CEFAZOLIN SODIUM-DEXTROSE 2-4 GM/100ML-% IV SOLN
2.0000 g | INTRAVENOUS | Status: AC
  Administered 2020-06-18: 2 g via INTRAVENOUS

## 2020-06-18 MED ORDER — SUCCINYLCHOLINE CHLORIDE 200 MG/10ML IV SOSY
PREFILLED_SYRINGE | INTRAVENOUS | Status: AC
  Filled 2020-06-18: qty 10

## 2020-06-18 MED ORDER — ROCURONIUM BROMIDE 100 MG/10ML IV SOLN
INTRAVENOUS | Status: DC | PRN
  Administered 2020-06-18: 20 mg via INTRAVENOUS

## 2020-06-18 SURGICAL SUPPLY — 34 items
ADAPTER IRRIG TUBE 2 SPIKE SOL (ADAPTER) ×4 IMPLANT
BAG URO DRAIN 4000ML (MISCELLANEOUS) ×2 IMPLANT
CATH FOLEY 3WAY 30CC 24FR (CATHETERS) ×1
CATH URETL 5X70 OPEN END (CATHETERS) ×2 IMPLANT
CATH URTH STD 24FR FL 3W 2 (CATHETERS) ×1 IMPLANT
CONTAINER COLLECT MORCELLATR (MISCELLANEOUS) ×1 IMPLANT
DRAPE UTILITY 15X26 TOWEL STRL (DRAPES) IMPLANT
ELECT BIVAP BIPO 22/24 DONUT (ELECTROSURGICAL)
ELECTRD BIVAP BIPO 22/24 DONUT (ELECTROSURGICAL) IMPLANT
FILTER OVERFLOW MORCELLATOR (FILTER) ×1 IMPLANT
GLOVE BIOGEL PI IND STRL 7.5 (GLOVE) ×1 IMPLANT
GLOVE BIOGEL PI INDICATOR 7.5 (GLOVE) ×1
GOWN STRL REUS W/ TWL LRG LVL3 (GOWN DISPOSABLE) ×1 IMPLANT
GOWN STRL REUS W/ TWL XL LVL3 (GOWN DISPOSABLE) ×1 IMPLANT
GOWN STRL REUS W/TWL LRG LVL3 (GOWN DISPOSABLE) ×1
GOWN STRL REUS W/TWL XL LVL3 (GOWN DISPOSABLE) ×1
HOLDER FOLEY CATH W/STRAP (MISCELLANEOUS) ×2 IMPLANT
KIT TURNOVER CYSTO (KITS) ×2 IMPLANT
LASER FIBER 550M SMARTSCOPE (Laser) ×2 IMPLANT
MBRN O SEALING YLW 17 FOR INST (MISCELLANEOUS) ×2
MEMBRANE SLNG YLW 17 FOR INST (MISCELLANEOUS) ×1 IMPLANT
MORCELLATOR COLLECT CONTAINER (MISCELLANEOUS) ×2
MORCELLATOR OVERFLOW FILTER (FILTER) ×2
MORCELLATOR ROTATION 4.75 335 (MISCELLANEOUS) ×2 IMPLANT
PACK CYSTO AR (MISCELLANEOUS) ×2 IMPLANT
SET CYSTO W/LG BORE CLAMP LF (SET/KITS/TRAYS/PACK) ×2 IMPLANT
SET IRRIG Y TYPE TUR BLADDER L (SET/KITS/TRAYS/PACK) ×2 IMPLANT
SLEEVE PROTECTION STRL DISP (MISCELLANEOUS) ×4 IMPLANT
SOL .9 NS 3000ML IRR  AL (IV SOLUTION) ×4
SOL .9 NS 3000ML IRR UROMATIC (IV SOLUTION) ×4 IMPLANT
SURGILUBE 2OZ TUBE FLIPTOP (MISCELLANEOUS) ×2 IMPLANT
SYRINGE IRR TOOMEY STRL 70CC (SYRINGE) ×2 IMPLANT
TUBE PUMP MORCELLATOR PIRANHA (TUBING) ×2 IMPLANT
WATER STERILE IRR 1000ML POUR (IV SOLUTION) ×2 IMPLANT

## 2020-06-18 NOTE — H&P (Signed)
UROLOGY H&P UPDATE  Agree with prior H&P dated 05/19/20.  86 year old male with metastatic prostate cancer and worsening obstructive symptoms including weak stream and nocturia 4-5 times per night.  He is on maximal medical therapy with Flomax, finasteride, as well as ADT for his prostate cancer with only minimal improvement in his symptoms.  We also tried Myrbetriq to see if his nocturia would improve, but he noticed no improvement.  He does have a history of urinary retention within the last 6 months.  PVRs have been normal to mildly elevated in clinic.  Prostate measured 70 g on prior MRI.  Cystoscopy by outside urologist reportedly showed no evidence of stricture disease or prostatic fossa tumor.  We have had multiple extensive conversations about the risks and benefits of an outlet procedure, and he adamantly would like to proceed.  Cardiac: RRR Lungs: CTA bilaterally  Laterality: N/A Procedure: HOLEP  Urine: Culture 7/1 3k mixed flora  We discussed the risks and benefits of HoLEP at length.  The procedure requires general anesthesia and takes 2 to 3 hours, and a holmium laser is used to enucleate the prostate and push this tissue into the bladder.  A morcellator is then used to remove this tissue, which is sent for pathology.  The vast majority of patients are able to discharge the same day with a catheter in place for 2 to 3 days, and will follow-up in clinic for a voiding trial.  Approximately 5% of patients will be admitted overnight to monitor the urine, or if they have multiple co-morbidities.  We specifically discussed the risks of bleeding, infection, retrograde ejaculation, temporary urgency and urge incontinence, very low risk of long-term incontinence, and possible need for additional procedures.  We also discussed possible need for a channel bipolar TURP, and possible admission overnight with his known prostate cancer and possible abnormal tissue planes and potential increased risk of  bleeding.   Billey Co, MD 06/18/2020

## 2020-06-18 NOTE — Progress Notes (Signed)
Patients cbi slowed , urine light pink at this time

## 2020-06-18 NOTE — Op Note (Signed)
Date of procedure: 06/18/20  Preoperative diagnosis:  1. Metastatic prostate cancer 2. Bladder outlet obstruction  Postoperative diagnosis:  1. Same  Procedure: 1. HoLEP (Holmium Laser Enucleation of the Prostate)  Surgeon: [name], MD  Anesthesia: General  Complications: None  Intraoperative findings:  1.  Moderate size prostate with high bladder neck, ureteral orifices orthotopic, moderate bladder trabeculations, no tumor in the prostatic fossa 2.  Uncomplicated HOLEP with excellent hemostasis, orifice ease and verumontanum intact at conclusion of case  EBL: <64ml  Specimens: Prostate tissue  Enucleation time: 36 minutes  Morcellation time: 6 minutes  Intra-op weight: 20 g  Drains: 24 French three-way, 50 cc in balloon  Indication: Andrew Callahan is a 86 y.o. patient with metastatic prostate cancer, history of urinary retention, and persistently bothersome urinary symptoms of weak stream, incomplete emptying, and nocturia despite maximal medical management.  After reviewing the management options for treatment, they elected to proceed with the above surgical procedure(s). We have discussed the potential benefits and risks of the procedure, side effects of the proposed treatment, the likelihood of the patient achieving the goals of the procedure, and any potential problems that might occur during the procedure or recuperation.  We specifically discussed the risks of bleeding, infection, hematuria and clot retention, need for additional procedures, possible overnight hospital stay, temporary urgency and incontinence, rare long-term incontinence, and retrograde ejaculation.  Informed consent has been obtained.   Description of procedure:  The patient was taken to the operating room and general anesthesia was induced.  The patient was placed in the dorsal lithotomy position, prepped and draped in the usual sterile fashion, and preoperative antibiotics were administered.  SCDs  were placed for DVT prophylaxis.  A preoperative time-out was performed.   The 39 French continuous flow resectoscope was inserted into the urethra using the visual obturator  The prostate was moderate in size with a high bladder neck.  There was no tumor projecting into the prostatic fossa.  The bladder was thoroughly inspected and notable for moderate bladder trabeculations.  The ureteral orifices were located in orthotopic position.  The laser was set to 2 J and 50 Hz and was used to make an incision at the 6 o'clock position to the level of the capsule from the bladder neck to the verumontanum.  The lateral lobes were then incised circumferentially until they were disconnected from the surrounding tissue.  The capsule was examined and laser was used for meticulous hemostasis.  The planes were slightly abnormal secondary to his prostate cancer and ADT, however hemostasis was excellent.  His prostate did appear to have shrunk significantly from the 70 g pre-op measurement after 6 months of ADT.  The 72 French resectoscope was then switched out for the 64 French nephroscope and the lobes were morcellated and the tissue sent to pathology.  A 24 French three-way catheter was inserted easily, and CBI was initiated.  50 cc were placed in the balloon.  Urine was crystal clear.  The catheter irrigated easily with a Toomey syringe.  A belladonna suppository was placed.  The patient tolerated the procedure well without any immediate complications and was extubated and transferred to the recovery room in stable condition.  Urine was clear on fast CBI.  Disposition: Stable to PACU  Plan: Wean CBI in PACU, anticipate discharge home today with void trial in clinic in 2-3 days Okay to discontinue tamsulosin and dutasteride  [name], MD 06/18/2020

## 2020-06-18 NOTE — Progress Notes (Signed)
pts foley cbi discontinued and foley plugged, urine remains pink

## 2020-06-18 NOTE — Anesthesia Preprocedure Evaluation (Signed)
Anesthesia Evaluation  Patient identified by MRN, date of birth, ID band Patient awake    Reviewed: Allergy & Precautions, H&P , NPO status , reviewed documented beta blocker date and time   History of Anesthesia Complications Negative for: history of anesthetic complications  Airway Mallampati: II  TM Distance: >3 FB Neck ROM: limited    Dental  (+) Caps, Chipped   Pulmonary neg shortness of breath, sleep apnea and Continuous Positive Airway Pressure Ventilation , neg COPD, neg recent URI, former smoker,    Pulmonary exam normal        Cardiovascular hypertension, (-) angina+ CAD  (-) Past MI and (-) Cardiac Stents (-) dysrhythmias (-) Valvular Problems/Murmurs Rhythm:irregular     Neuro/Psych neg Seizures CVA, Residual Symptoms    GI/Hepatic Neg liver ROS, neg GERD  ,  Endo/Other  diabetesHypothyroidism   Renal/GU Renal disease (kidney stones)     Musculoskeletal   Abdominal   Peds  Hematology   Anesthesia Other Findings Past Medical History:  1.  Atrial fibrillation. 2.  Hypertension. 3.  Hypercholesterolemia. 4.  Panhypopituitarism.  5.  Hypothyroidism. 6.  History of stroke June 2020 which left him with dysarthria and difficulty with handwriting and balance issues. 7.  Sleep apnea. No date: Chronic constipation No date: Elevated prostate specific antigen (PSA) No date: History of kidney stones No date: Hyperlipidemia No date: Hypertension No date: Hypothyroid No date: Hypothyroidism No date: Paroxysmal A-fib (HCC) No date: Sinus bradycardia No date: Sleep apnea     Comment:  uses CPAP No date: Stroke [facility]) Past Surgical History: 1997: BACK SURGERY     Comment:  L3, 4, 5 laminectomy  1998: BACK SURGERY     Comment:  hard wire removed, fusion of L3-5 2009: BACK SURGERY     Comment:  laminotomy/foraminotomy w/ decompression of nerve root,               facet thermal ablation L2-3.  2010: [place] 2007: KNEE SURGERY; Left     Comment:  Arthroscopic partial knee replacement, replaced medial               side  2014: MOHS SURGERY; Right     Comment:  squamous cell carcinoma 2008: NECK SURGERY     Comment:  fusion with hardwire in place C3 through C7  1994: pituitary adenoma     Comment:  benign No date: SPINE SURGERY 2016: SPINE SURGERY     Comment:  fusion L4    Reproductive/Obstetrics negative OB ROS                             Anesthesia Physical  Anesthesia Plan  ASA: III  Anesthesia Plan: General   Post-op Pain Management:    Induction: Intravenous  PONV Risk Score and Plan: Ondansetron, Treatment may vary due to age or medical condition and Dexamethasone  Airway Management Planned: Oral ETT  Additional Equipment:   Intra-op Plan:   Post-operative Plan: Extubation in OR  Informed Consent: I have reviewed the patients History and Physical, chart, labs and discussed the procedure including the risks, benefits and alternatives for the proposed anesthesia with the patient or authorized representative who has indicated his/her understanding and acceptance.     Dental Advisory Given  Plan Discussed with: CRNA  Anesthesia Plan Comments:         Anesthesia Quick Evaluation

## 2020-06-18 NOTE — OR Nursing (Signed)
Post op home care instructions on foley care.  Demonstrated to wife.  Verbalizes understanding.  Opted to keep large bedside bag and forgo the leg bag.  Appointment on Tues 7/13 to remove catheter.

## 2020-06-18 NOTE — Anesthesia Procedure Notes (Signed)
Procedure Name: Intubation Date/Time: 06/18/2020 7:44 AM Performed by: Chanetta Marshall, CRNA Pre-anesthesia Checklist: Patient identified, Emergency Drugs available, Suction available and Patient being monitored Patient Re-evaluated:Patient Re-evaluated prior to induction Oxygen Delivery Method: Circle system utilized Preoxygenation: Pre-oxygenation with 100% oxygen Induction Type: IV induction Ventilation: Mask ventilation without difficulty Laryngoscope Size: McGraph and 3 Grade View: Grade I Tube type: Oral Number of attempts: 1 Airway Equipment and Method: Video-laryngoscopy Placement Confirmation: ETT inserted through vocal cords under direct vision,  positive ETCO2,  breath sounds checked- equal and bilateral and CO2 detector Secured at: 22 cm Tube secured with: Tape Dental Injury: Teeth and Oropharynx as per pre-operative assessment

## 2020-06-18 NOTE — Discharge Instructions (Signed)

## 2020-06-18 NOTE — Progress Notes (Signed)
CBI stopped at this time, urine remains pink tinged at this time

## 2020-06-18 NOTE — Transfer of Care (Signed)
Immediate Anesthesia Transfer of Care Note  Patient: Andrew Callahan  Procedure(s) Performed: HOLEP-LASER ENUCLEATION OF THE PROSTATE WITH MORCELLATION (N/A Prostate)  Patient Location: PACU  Anesthesia Type:General  Level of Consciousness: awake, alert  and oriented  Airway & Oxygen Therapy: Patient Spontanous Breathing and Patient connected to face mask oxygen  Post-op Assessment: Report given to RN and Post -op Vital signs reviewed and stable  Post vital signs: Reviewed and stable  Last Vitals:  Vitals Value Taken Time  BP    Temp    Pulse    Resp    SpO2      Last Pain: There were no vitals filed for this visit.       Complications: No complications documented.

## 2020-06-19 NOTE — Anesthesia Postprocedure Evaluation (Signed)
Anesthesia Post Note  Patient: Andrew Callahan  Procedure(s) Performed: HOLEP-LASER ENUCLEATION OF THE PROSTATE WITH MORCELLATION (N/A Prostate)  Patient location during evaluation: PACU Anesthesia Type: General Level of consciousness: awake and alert Pain management: pain level controlled Vital Signs Assessment: post-procedure vital signs reviewed and stable Respiratory status: spontaneous breathing, nonlabored ventilation, respiratory function stable and patient connected to nasal cannula oxygen Cardiovascular status: blood pressure returned to baseline and stable Postop Assessment: no apparent nausea or vomiting Anesthetic complications: no   No complications documented.   Last Vitals:  Vitals:   06/18/20 1045 06/18/20 1116  BP: (!) 181/79 (!) 180/80  Pulse:    Resp:    Temp:    SpO2:      Last Pain:  Vitals:   06/18/20 1045  TempSrc:   PainSc: 2                  Martha Clan

## 2020-06-22 ENCOUNTER — Ambulatory Visit: Payer: Medicare Other | Admitting: Physician Assistant

## 2020-06-22 ENCOUNTER — Encounter: Payer: Self-pay | Admitting: Physician Assistant

## 2020-06-22 ENCOUNTER — Ambulatory Visit (INDEPENDENT_AMBULATORY_CARE_PROVIDER_SITE_OTHER): Payer: Medicare Other | Admitting: Physician Assistant

## 2020-06-22 ENCOUNTER — Other Ambulatory Visit: Payer: Self-pay

## 2020-06-22 VITALS — BP 155/79 | HR 59 | Ht 70.0 in | Wt 239.2 lb

## 2020-06-22 DIAGNOSIS — N320 Bladder-neck obstruction: Secondary | ICD-10-CM

## 2020-06-22 LAB — BLADDER SCAN AMB NON-IMAGING

## 2020-06-22 LAB — SURGICAL PATHOLOGY

## 2020-06-22 NOTE — Progress Notes (Signed)
Fill and Pull Catheter Removal  Patient is present today for a catheter removal.  Patient was cleaned and prepped in a sterile fashion 222ml of sterile water was instilled into the bladder when the patient felt the urge to urinate. 70ml of water was then drained from the balloon.  A 24FR three-way foley cath was removed from the bladder no complications were noted .  Patient was then given some time to void on their own.  Patient can void  147ml on their own after some time.  Patient tolerated well.  Performed by: Debroah Loop, PA-C   Follow up/ Additional notes: Push fluids and RTC this afternoon for PVR.

## 2020-06-22 NOTE — Progress Notes (Signed)
Afternoon follow-up  Patient returned to clinic this afternoon for repeat PVR. He reports drinking approximately 38oz of fluid. He has been able to urinate. He has had urinary leakage. PVR 38mL.  Results for orders placed or performed in visit on 06/22/20  Bladder Scan (Post Void Residual) in office  Result Value Ref Range   Scan Result 41mL     Voiding trial passed. Counseled patient on normal postoperative findings including dysuria, hematuria, and urinary leakage. Counseled patient to start Kegel exercises 3x10 sets daily. Patient to resume Eliquis tomorrow.  Follow up: As below. Future Appointments  Date Time Provider [place]  08/12/2020  1:00 PM Billey Co, MD [facility] None

## 2020-06-22 NOTE — Patient Instructions (Signed)

## 2020-06-24 ENCOUNTER — Ambulatory Visit: Payer: Medicare Other | Admitting: Physician Assistant

## 2020-06-25 ENCOUNTER — Other Ambulatory Visit: Payer: Self-pay | Admitting: Pharmacist

## 2020-06-25 DIAGNOSIS — C61 Malignant neoplasm of prostate: Secondary | ICD-10-CM

## 2020-06-28 ENCOUNTER — Telehealth: Payer: Self-pay

## 2020-06-28 ENCOUNTER — Other Ambulatory Visit: Payer: Self-pay | Admitting: *Deleted

## 2020-06-28 DIAGNOSIS — C61 Malignant neoplasm of prostate: Secondary | ICD-10-CM

## 2020-06-28 MED ORDER — ABIRATERONE ACETATE 250 MG PO TABS: 1000.0000 mg | ORAL_TABLET | Freq: Every day | ORAL | 2 refills | Status: DC

## 2020-06-28 NOTE — Telephone Encounter (Signed)
-----   Message from Billey Co, MD sent at 06/26/2020 11:32 AM EDT ----- Patient with metastatic prostate cancer who recently underwent HOLEP for urinary symptoms.  His holep pathology did not show any prostate cancer, this is likely because he is on the hormone therapy, and is not uncommon.  Keep follow-up as scheduled with me in September.  [name], MD 06/26/2020

## 2020-06-28 NOTE — Telephone Encounter (Signed)
Called pt and wife, informed him of the information below. Pt gave verbal understanding. Lengthy discussion had with pt and wife on post-op urine leakage in regards to what is normal. All questions answered.

## 2020-07-01 ENCOUNTER — Encounter: Payer: Self-pay | Admitting: Oncology

## 2020-07-01 NOTE — Progress Notes (Signed)
Patient reports still has frequent trips to urinate and he has noticed some blood from time to time. He also states his stream is sometimes very slow. Reports having to go every 2 hours to bathroom.

## 2020-07-02 ENCOUNTER — Inpatient Hospital Stay: Payer: Medicare Other | Attending: Oncology

## 2020-07-02 ENCOUNTER — Inpatient Hospital Stay (HOSPITAL_BASED_OUTPATIENT_CLINIC_OR_DEPARTMENT_OTHER): Payer: Medicare Other | Admitting: Oncology

## 2020-07-02 ENCOUNTER — Other Ambulatory Visit: Payer: Self-pay

## 2020-07-02 ENCOUNTER — Inpatient Hospital Stay: Payer: Medicare Other

## 2020-07-02 VITALS — BP 164/78 | HR 47 | Temp 96.5°F | Resp 20 | Wt 236.9 lb

## 2020-07-02 DIAGNOSIS — C61 Malignant neoplasm of prostate: Secondary | ICD-10-CM | POA: Diagnosis present

## 2020-07-02 DIAGNOSIS — C7951 Secondary malignant neoplasm of bone: Secondary | ICD-10-CM | POA: Insufficient documentation

## 2020-07-02 LAB — CBC WITH DIFFERENTIAL/PLATELET
Abs Immature Granulocytes: 0.05 10*3/uL (ref 0.00–0.07)
Basophils Absolute: 0 10*3/uL (ref 0.0–0.1)
Basophils Relative: 0 %
Eosinophils Absolute: 0.1 10*3/uL (ref 0.0–0.5)
Eosinophils Relative: 1 %
HCT: 32.4 % — ABNORMAL LOW (ref 39.0–52.0)
Hemoglobin: 10.8 g/dL — ABNORMAL LOW (ref 13.0–17.0)
Immature Granulocytes: 1 %
Lymphocytes Relative: 21 %
Lymphs Abs: 2.2 10*3/uL (ref 0.7–4.0)
MCH: 27.8 pg (ref 26.0–34.0)
MCHC: 33.3 g/dL (ref 30.0–36.0)
MCV: 83.5 fL (ref 80.0–100.0)
Monocytes Absolute: 0.9 10*3/uL (ref 0.1–1.0)
Monocytes Relative: 8 %
Neutro Abs: 7.2 10*3/uL (ref 1.7–7.7)
Neutrophils Relative %: 69 %
Platelets: 257 10*3/uL (ref 150–400)
RBC: 3.88 MIL/uL — ABNORMAL LOW (ref 4.22–5.81)
RDW: 14.6 % (ref 11.5–15.5)
WBC: 10.4 10*3/uL (ref 4.0–10.5)
nRBC: 0 % (ref 0.0–0.2)

## 2020-07-02 LAB — COMPREHENSIVE METABOLIC PANEL
ALT: 30 U/L (ref 0–44)
AST: 37 U/L (ref 15–41)
Albumin: 3.7 g/dL (ref 3.5–5.0)
Alkaline Phosphatase: 66 U/L (ref 38–126)
Anion gap: 7 (ref 5–15)
BUN: 19 mg/dL (ref 8–23)
CO2: 23 mmol/L (ref 22–32)
Calcium: 8.6 mg/dL — ABNORMAL LOW (ref 8.9–10.3)
Chloride: 111 mmol/L (ref 98–111)
Creatinine, Ser: 0.98 mg/dL (ref 0.61–1.24)
GFR calc Af Amer: >60 mL/min (ref >60)
GFR calc non Af Amer: >60 mL/min (ref >60)
Glucose, Bld: 119 mg/dL — ABNORMAL HIGH (ref 70–99)
Potassium: 4 mmol/L (ref 3.5–5.1)
Sodium: 141 mmol/L (ref 135–145)
Total Bilirubin: 0.8 mg/dL (ref 0.3–1.2)
Total Protein: 6.7 g/dL (ref 6.5–8.1)

## 2020-07-02 LAB — PSA: Prostatic Specific Antigen: 0.07 ng/mL (ref 0.00–4.00)

## 2020-07-02 MED ORDER — DENOSUMAB 120 MG/1.7ML ~~LOC~~ SOLN
120.0000 mg | Freq: Once | SUBCUTANEOUS | Status: AC
  Administered 2020-07-02: 120 mg via SUBCUTANEOUS
  Filled 2020-07-02: qty 1.7

## 2020-07-02 NOTE — Progress Notes (Signed)
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  INTERVAL HISTORY: [name] is an 86 year old male with past medical history significant for hypertension, OSA, allergic rhinitis, hypothyroidism, adrenal insufficiency, hyperlipidemia, type 2 diabetes and BPH.  He is followed by Dr. 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  He is currently receiving daily Zytiga, monthly Xgeva and every 14-month Eligard.  On 06/18/2020 underwent HoLEP procedure with Dr. Caprice Beaver.  Tolerated procedure well.  Has questions about tissue that was removed stating it was negative for prostate cancer.  He denies any side effects from current medications.  He does not complain of pain today.  He has no neurologic complaints.  He denies any recent fevers or illnesses.  He has a good appetite and denies weight loss.  He has no chest pain, shortness of breath, cough, or hemoptysis.  He denies any nausea, vomiting, constipation, or diarrhea.  He continues to have nocturia and frequency, but no other urinary complaints.  Patient offers no further specific complaints today.  REVIEW OF SYSTEMS:   Review of Systems  Constitutional: Negative.  Negative for chills, fever, malaise/fatigue and weight loss.  HENT: Negative for congestion, ear pain and tinnitus.   Eyes: Negative.  Negative for blurred vision and double vision.  Respiratory: Negative.  Negative for cough, sputum production and shortness of breath.   Cardiovascular: Negative.  Negative for chest pain, palpitations and leg swelling.  Gastrointestinal: Negative.   Negative for abdominal pain, constipation, diarrhea, nausea and vomiting.  Genitourinary: Negative for dysuria, frequency and urgency.  Musculoskeletal: Negative for back pain and falls.  Skin: Negative.  Negative for rash.  Neurological: Negative.  Negative for weakness and headaches.  Endo/Heme/Allergies: Negative.  Does not bruise/bleed easily.  Psychiatric/Behavioral: Negative.  Negative for depression. The patient is not nervous/anxious and does not have insomnia.     As per HPI. Otherwise, a complete review of systems is negative.  PAST MEDICAL HISTORY: Past Medical History:  Diagnosis Date  . Chronic constipation   . Elevated prostate specific antigen (PSA)   . Family history of leukemia   . Family history of lung cancer   . History of kidney stones   . Hyperlipidemia   . Hypertension   . Hypothyroid   . Hypothyroidism   . Paroxysmal A-fib ([place])   . Sinus bradycardia   . Sleep apnea    uses CPAP  . Stroke ([place])    throat / slight difficulty swallowing    PAST SURGICAL HISTORY: Past Surgical History:  Procedure Laterality Date  . BACK SURGERY  1997   L3, 4, 5 laminectomy   . BACK SURGERY  1998   hard wire removed, fusion of L3-5  . BACK SURGERY  2009   laminotomy/foraminotomy w/ decompression of nerve root, facet thermal ablation L2-3.   Marland Kitchen HOLEP-LASER ENUCLEATION OF THE PROSTATE WITH MORCELLATION N/A 06/18/2020   Procedure: HOLEP-LASER ENUCLEATION OF THE PROSTATE WITH MORCELLATION;  Surgeon: Billey Co, MD;  Location: [facility] ORS;  Service: Urology;  Laterality: N/A;  . KIDNEY STONE SURGERY  2010  . KNEE SURGERY Left 2007   Arthroscopic partial knee replacement, replaced medial side   .  MOHS SURGERY Right 2014   squamous cell carcinoma  . NECK SURGERY  2008   fusion with hardwire in place C3 through C7   . pituitary adenoma  1994   benign  . PROSTATE BIOPSY N/A 01/01/2020   Procedure: PROSTATE BIOPSY URO NAV FUSION;  Surgeon: Royston Cowper, MD;  Location:  [facility] ORS;  Service: Urology;  Laterality: N/A;  . SPINE SURGERY    . SPINE SURGERY  2016   fusion L4     FAMILY HISTORY: Family History  Problem Relation Age of Onset  . Stroke Mother   . Heart attack Father   . Leukemia Sister 7  . Lung cancer Brother 29    ADVANCED DIRECTIVES (Y/N):  N  HEALTH MAINTENANCE: Social History   Tobacco Use  . Smoking status: Former Research scientist (life sciences)  . Smokeless tobacco: Never Used  . Tobacco comment: quit 50 years ago  Vaping Use  . Vaping Use: Never used  Substance Use Topics  . Alcohol use: Yes    Comment: rarely  . Drug use: Never     Colonoscopy:  PAP:  Bone density:  Lipid panel:  Allergies  Allergen Reactions  . Metoprolol Other (See Comments)    dizziness   . Rivaroxaban Other (See Comments)    hematuria     Current Outpatient Medications  Medication Sig Dispense Refill  . abiraterone acetate (ZYTIGA) 250 MG tablet Take 4 tablets (1,000 mg total) by mouth daily. Take on an empty stomach 1 hour before or 2 hours after a meal 120 tablet 2  . B Complex-C (SUPER B COMPLEX PO) Take 1 capsule by mouth daily.     . Calcium-Magnesium-Vitamin D (CALCIUM 1200+D3 PO) Take 2 tablets by mouth daily.    . cetirizine (ZYRTEC) 10 MG tablet Take 10 mg by mouth at bedtime.    . degarelix (FIRMAGON, 240 MG DOSE,) 120 mg injection Inject 120 mg into the skin every 28 (twenty-eight) days.     Marland Kitchen denosumab (XGEVA) 120 MG/1.7ML SOLN injection Inject 120 mg into the skin every 28 (twenty-eight) days.     Marland Kitchen ELIQUIS 5 MG TABS tablet Take 5 mg by mouth 2 (two) times daily.    . hydrocortisone (CORTEF) 10 MG tablet Take 10 mg by mouth 2 (two) times daily.     Marland Kitchen Leuprolide Acetate (ELIGARD [place]) Inject into the skin.    Marland Kitchen Leuprolide Acetate, 3 Month, (ELIGARD) 22.5 MG injection     . levothyroxine (SYNTHROID) 50 MCG tablet Take 50 mcg by mouth daily before breakfast.    . Misc Natural Products (OSTEO BI-FLEX ADV JOINT SHIELD PO) Take 1 tablet by mouth daily.      . Multiple Vitamin (MULTIVITAMIN WITH MINERALS) TABS tablet Take 1 tablet by mouth daily. One-A-Day Men's 66+    . NON FORMULARY Take 1 capsule by mouth at bedtime. Pure Encapsulations MotilPro    . polycarbophil (FIBERCON) 625 MG tablet Take 625 mg by mouth daily.    . polyethylene glycol (MIRALAX / GLYCOLAX) 17 g packet Take 17 g by mouth daily. W/coffee    . Probiotic Product (ALIGN) 4 MG CAPS Take 4 mg by mouth daily.     . rosuvastatin (CRESTOR) 20 MG tablet Take 20 mg by mouth daily.    . valsartan (DIOVAN) 320 MG tablet Take 320 mg by mouth daily.     No current facility-administered medications for this visit.    OBJECTIVE: There were no vitals filed for this visit.  There is no height or weight on file to calculate BMI.    ECOG FS:0 - Asymptomatic  Physical Exam Constitutional:      Appearance: Normal appearance.  HENT:     Head: Normocephalic and atraumatic.  Eyes:     Pupils: Pupils are equal, round, and reactive to light.  Cardiovascular:     Rate and Rhythm: Normal rate and regular rhythm.     Heart sounds: Normal heart sounds. No murmur heard.   Pulmonary:     Effort: Pulmonary effort is normal.     Breath sounds: Normal breath sounds. No wheezing.  Abdominal:     General: Bowel sounds are normal. There is no distension.     Palpations: Abdomen is soft.     Tenderness: There is no abdominal tenderness.  Musculoskeletal:        General: Normal range of motion.     Cervical back: Normal range of motion.  Skin:    General: Skin is warm and dry.     Findings: No rash.  Neurological:     Mental Status: He is alert and oriented to person, place, and time.  Psychiatric:        Judgment: Judgment normal.      LAB RESULTS:  Lab Results  Component Value Date   NA 141 07/02/2020   K 4.0 07/02/2020   CL 111 07/02/2020   CO2 23 07/02/2020   GLUCOSE 119 (H) 07/02/2020   BUN 19 07/02/2020   CREATININE 0.98 07/02/2020   CALCIUM 8.6 (L) 07/02/2020   PROT 6.7  07/02/2020   ALBUMIN 3.7 07/02/2020   AST 37 07/02/2020   ALT 30 07/02/2020   ALKPHOS 66 07/02/2020   BILITOT 0.8 07/02/2020   GFRNONAA >60 07/02/2020   GFRAA >60 07/02/2020    Lab Results  Component Value Date   WBC 10.4 07/02/2020   NEUTROABS 7.2 07/02/2020   HGB 10.8 (L) 07/02/2020   HCT 32.4 (L) 07/02/2020   MCV 83.5 07/02/2020   PLT 257 07/02/2020     STUDIES: No results found.  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  PLAN:    1. 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  Patient's PSA was initially greater than 8.0, but his most recent result continues to trend down is now 0.07.  He was recently transitioned from [place] to [place].  First dose was last month.  Proceed with Xgeva today. Continue Zytiga as directed.  Return to clinic in 4 weeks for repeat laboratory work, further evaluation, and continuation of Xgeva. 2.  Bony metastasis: Xgeva as above.  Patient's most recent calcium levels are adequate to proceed as scheduled. 3.  Urinary frequency secondary to BPH: Had recent HoLEP procedure with Dr. Caprice Beaver.  Symptoms have improved.  Continue follow-up per urology.  Greater than 50% was spent in counseling and coordination of care with this patient including but not limited to discussion of the relevant topics above (See A&P) including, but not limited to diagnosis and management of acute and chronic medical conditions.   Patient expressed understanding and was in agreement with this plan. He also understands that He can call clinic at any time with any questions, concerns, or complaints.   Cancer Staging Prostate cancer [facility]) Staging form: Prostate, AJCC 8th Edition - Clinical stage from 02/17/2020: Stage IVB (cT2c, cN0, cM1b, PSA: 8.6, Grade Group: 5) - Signed by Lloyd Huger, MD on 02/17/2020  Jacquelin Hawking, NP   07/02/2020  9:41 AM

## 2020-07-02 NOTE — Progress Notes (Signed)
Calcium 8.6; normal albumin. Dr. Woodfin Ganja ok with continuing Ignatius Specking, PharmD

## 2020-07-07 NOTE — Interval H&P Note (Signed)
History and Physical Interval Note:  07/07/2020 12:32 PM  Andrew Callahan  has presented today for surgery, with the diagnosis of prostate cancer, lower urinary tract symptoms.  The various methods of treatment have been discussed with the patient and family. After consideration of risks, benefits and other options for treatment, the patient has consented to  Procedure(s): [place] WITH MORCELLATION (N/A) as a surgical intervention.  The patient's history has been reviewed, patient examined, no change in status, stable for surgery.  I have reviewed the patient's chart and labs.  Questions were answered to the patient's satisfaction.     Billey Co

## 2020-07-24 NOTE — Progress Notes (Signed)
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  INTERVAL HISTORY: Patient returns to clinic today for further evaluation and continuation of [name].  He continues to have complaints of urinary frequency and now has had some hematuria.  He also complains of occasional hot flashes. He has no neurologic complaints.  He denies any recent fevers or illnesses.  He has a good appetite and denies weight loss.  He has no chest pain, shortness of breath, cough, or hemoptysis.  He denies any nausea, vomiting, constipation, or diarrhea.  Patient offers no further specific complaints today.  REVIEW OF SYSTEMS:   Review of Systems  Constitutional: Negative.  Negative for fever, malaise/fatigue and weight loss.  Respiratory: Negative.  Negative for cough and hemoptysis.   Cardiovascular: Negative.  Negative for chest pain and leg swelling.  Gastrointestinal: Negative.  Negative for abdominal pain.  Genitourinary: Positive for frequency and hematuria.  Musculoskeletal: Negative.  Negative for back pain.  Skin: Negative.  Negative for rash.  Neurological: Positive for sensory change. Negative for dizziness, focal weakness, weakness and headaches.  Psychiatric/Behavioral: Negative.  The patient is not nervous/anxious.     As per HPI. Otherwise, a complete review of systems is negative.  PAST MEDICAL HISTORY: Past Medical History:  Diagnosis Date  . Chronic constipation   . Elevated prostate specific antigen (PSA)   . Family history of leukemia   . Family history of lung cancer   . History of kidney stones     . Hyperlipidemia   . Hypertension   . Hypothyroid   . Hypothyroidism   . Paroxysmal A-fib ([place])   . Sinus bradycardia   . Sleep apnea    uses CPAP  . Stroke ([place])    throat / slight difficulty swallowing    PAST SURGICAL HISTORY: Past Surgical History:  Procedure Laterality Date  . BACK SURGERY  1997   L3, 4, 5 laminectomy   . BACK SURGERY  1998   hard wire removed, fusion of L3-5  . BACK SURGERY  2009   laminotomy/foraminotomy w/ decompression of nerve root, facet thermal ablation L2-3.   Marland Kitchen HOLEP-LASER ENUCLEATION OF THE PROSTATE WITH MORCELLATION N/A 06/18/2020   Procedure: HOLEP-LASER ENUCLEATION OF THE PROSTATE WITH MORCELLATION;  Surgeon: Billey Co, MD;  Location: [facility] ORS;  Service: Urology;  Laterality: N/A;  . KIDNEY STONE SURGERY  2010  . KNEE SURGERY Left 2007   Arthroscopic partial knee replacement, replaced medial side   . MOHS SURGERY Right 2014   squamous cell carcinoma  . NECK SURGERY  2008   fusion with hardwire in place C3 through C7   . pituitary adenoma  1994   benign  . PROSTATE BIOPSY N/A 01/01/2020   Procedure: PROSTATE BIOPSY URO NAV FUSION;  Surgeon: Royston Cowper, MD;  Location: [facility] ORS;  Service: Urology;  Laterality: N/A;  . SPINE SURGERY    . SPINE SURGERY  2016   fusion L4     FAMILY HISTORY: Family History  Problem Relation Age of Onset  . Stroke Mother   . Heart attack Father   . Leukemia Sister 63  . Lung cancer Brother 24  ADVANCED DIRECTIVES (Y/N):  N  HEALTH MAINTENANCE: Social History   Tobacco Use  . Smoking status: Former Research scientist (life sciences)  . Smokeless tobacco: Never Used  . Tobacco comment: quit 50 years ago  Vaping Use  . Vaping Use: Never used  Substance Use Topics  . Alcohol use: Yes    Comment: rarely  . Drug use: Never     Colonoscopy:  PAP:  Bone density:  Lipid panel:  Allergies  Allergen Reactions  . Metoprolol Other (See Comments)    dizziness   . Rivaroxaban Other (See Comments)     hematuria     Current Outpatient Medications  Medication Sig Dispense Refill  . abiraterone acetate (ZYTIGA) 250 MG tablet Take 4 tablets (1,000 mg total) by mouth daily. Take on an empty stomach 1 hour before or 2 hours after a meal 120 tablet 2  . B Complex-C (SUPER B COMPLEX PO) Take 1 capsule by mouth daily.     . Calcium-Magnesium-Vitamin D (CALCIUM 1200+D3 PO) Take 2 tablets by mouth daily.    . cetirizine (ZYRTEC) 10 MG tablet Take 10 mg by mouth at bedtime.    . degarelix (FIRMAGON, 240 MG DOSE,) 120 mg injection Inject 120 mg into the skin every 28 (twenty-eight) days.     Marland Kitchen denosumab (XGEVA) 120 MG/1.7ML SOLN injection Inject 120 mg into the skin every 28 (twenty-eight) days.     Marland Kitchen ELIQUIS 5 MG TABS tablet Take 5 mg by mouth 2 (two) times daily.    . hydrocortisone (CORTEF) 10 MG tablet Take 10 mg by mouth 2 (two) times daily.     Marland Kitchen Leuprolide Acetate (ELIGARD [place]) Inject into the skin.    Marland Kitchen Leuprolide Acetate, 3 Month, (ELIGARD) 22.5 MG injection     . levothyroxine (SYNTHROID) 50 MCG tablet Take 50 mcg by mouth daily before breakfast.    . Misc Natural Products (OSTEO BI-FLEX ADV JOINT SHIELD PO) Take 1 tablet by mouth daily.     . Multiple Vitamin (MULTIVITAMIN WITH MINERALS) TABS tablet Take 1 tablet by mouth daily. One-A-Day Men's 22+    . NON FORMULARY Take 1 capsule by mouth at bedtime. Pure Encapsulations MotilPro    . polycarbophil (FIBERCON) 625 MG tablet Take 625 mg by mouth daily.    . polyethylene glycol (MIRALAX / GLYCOLAX) 17 g packet Take 17 g by mouth daily. W/coffee    . Probiotic Product (ALIGN) 4 MG CAPS Take 4 mg by mouth daily.     . rosuvastatin (CRESTOR) 20 MG tablet Take 20 mg by mouth daily.    . valsartan (DIOVAN) 320 MG tablet Take 320 mg by mouth daily.     No current facility-administered medications for this visit.    OBJECTIVE: Vitals:   07/30/20 1053  BP: 140/76  Pulse: (!) 56  Resp: 16  Temp: 98.4 F (36.9 C)  SpO2: 100%     Body mass  index is 34.24 kg/m.    ECOG FS:0 - Asymptomatic  General: Well-developed, well-nourished, no acute distress. Eyes: Pink conjunctiva, anicteric sclera. HEENT: Normocephalic, moist mucous membranes. Lungs: No audible wheezing or coughing. Heart: Regular rate and rhythm. Abdomen: Soft, nontender, no obvious distention. Musculoskeletal: No edema, cyanosis, or clubbing. Neuro: Alert, answering all questions appropriately. Cranial nerves grossly intact. Skin: No rashes or petechiae noted. Psych: Normal affect.  LAB RESULTS:  Lab Results  Component Value Date   NA 141 07/30/2020   K 4.1 07/30/2020   CL 109 07/30/2020   CO2 24 07/30/2020  GLUCOSE 135 (H) 07/30/2020   BUN 20 07/30/2020   CREATININE 0.98 07/30/2020   CALCIUM 8.7 (L) 07/30/2020   PROT 6.8 07/30/2020   ALBUMIN 3.8 07/30/2020   AST 29 07/30/2020   ALT 17 07/30/2020   ALKPHOS 59 07/30/2020   BILITOT 0.6 07/30/2020   GFRNONAA >60 07/30/2020   GFRAA >60 07/30/2020    Lab Results  Component Value Date   WBC 8.4 07/30/2020   NEUTROABS 6.2 07/30/2020   HGB 10.4 (L) 07/30/2020   HCT 33.0 (L) 07/30/2020   MCV 80.5 07/30/2020   PLT 208 07/30/2020     STUDIES: No results found.  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  PLAN:    1. 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  Patient's PSA was initially greater than 8.0, but his most recent result continues to trend down is now 0.07.  Today's result is pending.  Continue Zytiga as directed.  Proceed with Xgeva today.  Patient last received Eligard on June 04, 2020.  Return to clinic in 4 weeks for further evaluation and continuation of treatment.   2.  Bony metastasis: Xgeva as above.  Patient's most recent calcium levels are adequate to proceed as scheduled.  Patient reports he may have some enamel loss and is going to see a dentist in the  next 1 to 2 weeks.  If this is the case, will likely need to discontinue Xgeva. 3.  Urinary frequency/hematuria: Appreciate urology input.  Continue follow-up with them as indicated. 4.  Hot flashes: Likely second Eligard, monitor. 5.  Anemia: Patient's hemoglobin has trended down slightly and is now 10.4.  Possibly related to patient's recent bout of hematuria.  Monitor.   Patient expressed understanding and was in agreement with this plan. He also understands that He can call clinic at any time with any questions, concerns, or complaints.   Cancer Staging Prostate cancer [facility]) Staging form: Prostate, AJCC 8th Edition - Clinical stage from 02/17/2020: Stage IVB (cT2c, cN0, cM1b, PSA: 8.6, Grade Group: 5) - Signed by Lloyd Huger, MD on 02/17/2020   Lloyd Huger, MD   07/30/2020 12:17 PM

## 2020-07-29 ENCOUNTER — Encounter: Payer: Self-pay | Admitting: Oncology

## 2020-07-30 ENCOUNTER — Inpatient Hospital Stay: Payer: Medicare Other | Attending: Oncology

## 2020-07-30 ENCOUNTER — Inpatient Hospital Stay (HOSPITAL_BASED_OUTPATIENT_CLINIC_OR_DEPARTMENT_OTHER): Payer: Medicare Other | Admitting: Oncology

## 2020-07-30 ENCOUNTER — Other Ambulatory Visit: Payer: Self-pay

## 2020-07-30 ENCOUNTER — Inpatient Hospital Stay: Payer: Medicare Other

## 2020-07-30 VITALS — BP 140/76 | HR 56 | Temp 98.4°F | Resp 16 | Wt 238.6 lb

## 2020-07-30 DIAGNOSIS — C61 Malignant neoplasm of prostate: Secondary | ICD-10-CM

## 2020-07-30 DIAGNOSIS — C7951 Secondary malignant neoplasm of bone: Secondary | ICD-10-CM | POA: Insufficient documentation

## 2020-07-30 LAB — CBC WITH DIFFERENTIAL/PLATELET
Abs Immature Granulocytes: 0.04 10*3/uL (ref 0.00–0.07)
Basophils Absolute: 0 10*3/uL (ref 0.0–0.1)
Basophils Relative: 1 %
Eosinophils Absolute: 0 10*3/uL (ref 0.0–0.5)
Eosinophils Relative: 0 %
HCT: 33 % — ABNORMAL LOW (ref 39.0–52.0)
Hemoglobin: 10.4 g/dL — ABNORMAL LOW (ref 13.0–17.0)
Immature Granulocytes: 1 %
Lymphocytes Relative: 17 %
Lymphs Abs: 1.4 10*3/uL (ref 0.7–4.0)
MCH: 25.4 pg — ABNORMAL LOW (ref 26.0–34.0)
MCHC: 31.5 g/dL (ref 30.0–36.0)
MCV: 80.5 fL (ref 80.0–100.0)
Monocytes Absolute: 0.7 10*3/uL (ref 0.1–1.0)
Monocytes Relative: 8 %
Neutro Abs: 6.2 10*3/uL (ref 1.7–7.7)
Neutrophils Relative %: 73 %
Platelets: 208 10*3/uL (ref 150–400)
RBC: 4.1 MIL/uL — ABNORMAL LOW (ref 4.22–5.81)
RDW: 15.4 % (ref 11.5–15.5)
WBC: 8.4 10*3/uL (ref 4.0–10.5)
nRBC: 0 % (ref 0.0–0.2)

## 2020-07-30 LAB — COMPREHENSIVE METABOLIC PANEL
ALT: 17 U/L (ref 0–44)
AST: 29 U/L (ref 15–41)
Albumin: 3.8 g/dL (ref 3.5–5.0)
Alkaline Phosphatase: 59 U/L (ref 38–126)
Anion gap: 8 (ref 5–15)
BUN: 20 mg/dL (ref 8–23)
CO2: 24 mmol/L (ref 22–32)
Calcium: 8.7 mg/dL — ABNORMAL LOW (ref 8.9–10.3)
Chloride: 109 mmol/L (ref 98–111)
Creatinine, Ser: 0.98 mg/dL (ref 0.61–1.24)
GFR calc Af Amer: >60 mL/min (ref >60)
GFR calc non Af Amer: >60 mL/min (ref >60)
Glucose, Bld: 135 mg/dL — ABNORMAL HIGH (ref 70–99)
Potassium: 4.1 mmol/L (ref 3.5–5.1)
Sodium: 141 mmol/L (ref 135–145)
Total Bilirubin: 0.6 mg/dL (ref 0.3–1.2)
Total Protein: 6.8 g/dL (ref 6.5–8.1)

## 2020-07-30 LAB — PSA: Prostatic Specific Antigen: 0.05 ng/mL (ref 0.00–4.00)

## 2020-07-30 MED ORDER — DENOSUMAB 120 MG/1.7ML ~~LOC~~ SOLN
120.0000 mg | Freq: Once | SUBCUTANEOUS | Status: AC
  Administered 2020-07-30: 120 mg via SUBCUTANEOUS
  Filled 2020-07-30: qty 1.7

## 2020-08-12 ENCOUNTER — Encounter: Payer: Self-pay | Admitting: Urology

## 2020-08-12 ENCOUNTER — Ambulatory Visit (INDEPENDENT_AMBULATORY_CARE_PROVIDER_SITE_OTHER): Payer: Medicare Other | Admitting: Urology

## 2020-08-12 ENCOUNTER — Other Ambulatory Visit: Payer: Self-pay

## 2020-08-12 VITALS — BP 154/88 | HR 54 | Ht 69.5 in | Wt 238.9 lb

## 2020-08-12 DIAGNOSIS — C61 Malignant neoplasm of prostate: Secondary | ICD-10-CM

## 2020-08-12 DIAGNOSIS — R399 Unspecified symptoms and signs involving the genitourinary system: Secondary | ICD-10-CM

## 2020-08-12 DIAGNOSIS — N320 Bladder-neck obstruction: Secondary | ICD-10-CM

## 2020-08-12 NOTE — Patient Instructions (Signed)
Avoid fluids that irritate your bladder including coffee, tea, sodas, diet drinks, juices.  Would recommend sticking with primarily water as the bladder heals.  Minimize fluid intake after 6 or 7 PM at night, and urinate right before going to bed.  Mirabegron extended-release tablets What is this medicine? MIRABEGRON (MIR a BEG ron) is used to treat overactive bladder. This medicine reduces the amount of bathroom visits. It may also help to control wetting accidents. It may be used alone, but sometimes may be given with other treatments. This medicine may be used for other purposes; ask your health care provider or pharmacist if you have questions. COMMON BRAND NAME(S): Myrbetriq What should I tell my health care provider before I take this medicine? They need to know if you have any of these conditions:  high blood pressure  kidney disease  liver disease  problems urinating  prostate disease  an unusual or allergic reaction to mirabegron, other medicines, foods, dyes, or preservatives  pregnant or trying to get pregnant  breast-feeding How should I use this medicine? Take this medicine by mouth with a glass of water. Follow the directions on the prescription label. Do not cut, crush or chew this medicine. You can take it with or without food. If it upsets your stomach, take it with food. Take your medicine at regular intervals. Do not take it more often than directed. Do not stop taking except on your doctor's advice. Talk to your pediatrician regarding the use of this medicine in children. Special care may be needed. Overdosage: If you think you have taken too much of this medicine contact a poison control center or emergency room at once. NOTE: This medicine is only for you. Do not share this medicine with others. What if I miss a dose? If you miss a dose, take it as soon as you can. If it is almost time for your next dose, take only that dose. Do not take double or extra  doses. What may interact with this medicine?  codeine  desipramine  digoxin  flecainide  MAOIs like Carbex, Eldepryl, Marplan, Nardil, and Parnate  methadone  metoprolol  pimozide  propafenone  thioridazine  warfarin This list may not describe all possible interactions. Give your health care provider a list of all the medicines, herbs, non-prescription drugs, or dietary supplements you use. Also tell them if you smoke, drink alcohol, or use illegal drugs. Some items may interact with your medicine. What should I watch for while using this medicine? Visit your doctor or health care professional for regular checks on your progress. Check your blood pressure as directed. Ask your doctor or health care professional what your blood pressure should be and when you should contact him or her. You may need to limit your intake of tea, coffee, caffeinated sodas, or alcohol. These drinks may make your symptoms worse. What side effects may I notice from receiving this medicine? Side effects that you should report to your doctor or health care professional as soon as possible:  allergic reactions like skin rash, itching or hives, swelling of the face, lips, or tongue  high blood pressure  fast, irregular heartbeat  redness, blistering, peeling or loosening of the skin, including inside the mouth  signs of infection like fever or chills; pain or difficulty passing urine  trouble passing urine or change in the amount of urine Side effects that usually do not require medical attention (report to your doctor or health care professional if they continue or are  bothersome):  constipation  dry mouth  headache  runny nose  stomach upset This list may not describe all possible side effects. Call your doctor for medical advice about side effects. You may report side effects to FDA at 1-800-FDA-1088. Where should I keep my medicine? Keep out of the reach of children. Store at room  temperature between 15 and 30 degrees C (59 and 86 degrees F). Throw away any unused medicine after the expiration date. NOTE: This sheet is a summary. It may not cover all possible information. If you have questions about this medicine, talk to your doctor, pharmacist, or health care provider.  2020 Elsevier/Gold Standard (2017-04-19 11:33:21)

## 2020-08-12 NOTE — Progress Notes (Signed)
   08/12/2020 1:00 PM   Andrew Callahan February 25, 1934 [id number]  Reason for visit: Follow up prostate cancer, HoLEP  HPI: I saw Andrew Callahan and his wife back in urology clinic today for follow-up of the above issues.  Briefly is an 86 year old male with metastatic prostate cancer being treated by oncology who has a history of urinary retention within the last 6 months, and had worsening obstructive urinary symptoms including weak stream, urgency, frequency, occasional urge incontinence, and nocturia 4-5 times per night despite ADT and maximal medical therapy with Flomax and finasteride.  He also tried Myrbetriq without any significant improvement.  He opted to pursue an outlet procedure for his persistently bothersome urinary symptoms.  He underwent an uncomplicated HOLEP on 02/16/9372 with removal of 28 g of tissue.  He is emptying well today with a PVR 5 ml.  He continues to have bothersome urinary symptoms of urgency, frequency every 2 hours during the day, and nocturia 4-5 times per night, as well as persistent urge incontinence requiring pads.  He remains very frustrated with his urinary symptoms at this time.  He is no longer having any gross hematuria.  He is voiding with a very strong stream compared to before surgery.  He drinks decaf coffee, tea, and juice throughout the day.  I did very long conversation with the patient and his wife about standard expectations after undergoing HOLEP, and that I anticipate his symptoms of urgency, frequency, incontinence, nocturia to continue to improve over the next few months.  We discussed that he has had blockage and obstruction for a very long time, and it can take the bladder months to adjust to this process with a wide open channel after HOLEP.  We discussed behavioral strategies including timed voiding, avoiding bladder irritants, avoiding fluids in the evening, and double voiding prior to bed.  I also gave him 6 weeks of 50 mg Myrbetriq samples to see if  this helps with some of his overactive symptoms as the bladder adjusts after HOLEP.  Reassurance was provided at length.  Trial of Myrbetriq samples while bladder adjusts after HOLEP RTC 3 months for symptom check and PVR   Billey Co, MD  [facility] [address], [place], [place] [address] [phone]

## 2020-08-18 ENCOUNTER — Other Ambulatory Visit: Payer: Self-pay | Admitting: Pharmacist

## 2020-08-18 DIAGNOSIS — C61 Malignant neoplasm of prostate: Secondary | ICD-10-CM

## 2020-08-29 NOTE — Progress Notes (Signed)
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  INTERVAL HISTORY: Patient returns to clinic today for routine evaluation and continuation of [name].  He continues to have chronic urinary frequency and nocturia.  He continues to have occasional hot flashes.  He has no neurologic complaints.  He denies any recent fevers or illnesses.  He has a good appetite and denies weight loss.  He has no chest pain, shortness of breath, cough, or hemoptysis.  He denies any nausea, vomiting, constipation, or diarrhea.  Patient offers no further specific complaints today.  REVIEW OF SYSTEMS:   Review of Systems  Constitutional: Negative.  Negative for fever, malaise/fatigue and weight loss.  Respiratory: Negative.  Negative for cough and hemoptysis.   Cardiovascular: Negative.  Negative for chest pain and leg swelling.  Gastrointestinal: Negative.  Negative for abdominal pain.  Genitourinary: Positive for frequency. Negative for hematuria.  Musculoskeletal: Negative.  Negative for back pain.  Skin: Negative.  Negative for rash.  Neurological: Positive for sensory change. Negative for dizziness, focal weakness, weakness and headaches.  Psychiatric/Behavioral: Negative.  The patient is not nervous/anxious.     As per HPI. Otherwise, a complete review of systems is negative.  PAST MEDICAL HISTORY: Past Medical History:  Diagnosis Date  . Chronic constipation   . Elevated prostate specific antigen (PSA)   . Family history of leukemia   . Family history of lung cancer   . History of kidney stones   .  Hyperlipidemia   . Hypertension   . Hypothyroid   . Hypothyroidism   . Paroxysmal A-fib ([place])   . Sinus bradycardia   . Sleep apnea    uses CPAP  . Stroke ([place])    throat / slight difficulty swallowing    PAST SURGICAL HISTORY: Past Surgical History:  Procedure Laterality Date  . BACK SURGERY  1997   L3, 4, 5 laminectomy   . BACK SURGERY  1998   hard wire removed, fusion of L3-5  . BACK SURGERY  2009   laminotomy/foraminotomy w/ decompression of nerve root, facet thermal ablation L2-3.   Marland Kitchen HOLEP-LASER ENUCLEATION OF THE PROSTATE WITH MORCELLATION N/A 06/18/2020   Procedure: HOLEP-LASER ENUCLEATION OF THE PROSTATE WITH MORCELLATION;  Surgeon: Billey Co, MD;  Location: [facility] ORS;  Service: Urology;  Laterality: N/A;  . KIDNEY STONE SURGERY  2010  . KNEE SURGERY Left 2007   Arthroscopic partial knee replacement, replaced medial side   . MOHS SURGERY Right 2014   squamous cell carcinoma  . NECK SURGERY  2008   fusion with hardwire in place C3 through C7   . pituitary adenoma  1994   benign  . PROSTATE BIOPSY N/A 01/01/2020   Procedure: PROSTATE BIOPSY URO NAV FUSION;  Surgeon: Royston Cowper, MD;  Location: [facility] ORS;  Service: Urology;  Laterality: N/A;  . SPINE SURGERY    . SPINE SURGERY  2016   fusion L4     FAMILY HISTORY: Family History  Problem Relation Age of Onset  . Stroke Mother   . Heart attack Father   . Leukemia Sister 59  . Lung cancer Brother 75    ADVANCED DIRECTIVES (Y/N):  N  HEALTH MAINTENANCE: Social History   Tobacco Use  . Smoking status: Former Research scientist (life sciences)  . Smokeless tobacco: Never Used  . Tobacco comment: quit 50 years ago  Vaping Use  . Vaping Use: Never used  Substance Use Topics  . Alcohol use: Yes    Comment: rarely  . Drug use: Never     Colonoscopy:  PAP:  Bone density:  Lipid panel:  Allergies  Allergen Reactions  . Metoprolol Other (See Comments)    dizziness   . Rivaroxaban Other (See Comments)    hematuria      Current Outpatient Medications  Medication Sig Dispense Refill  . abiraterone acetate (ZYTIGA) 250 MG tablet Take 4 tablets (1,000 mg total) by mouth daily. Take on an empty stomach 1 hour before or 2 hours after a meal 120 tablet 2  . B Complex-C (SUPER B COMPLEX PO) Take 1 capsule by mouth daily.     . Calcium-Magnesium-Vitamin D (CALCIUM 1200+D3 PO) Take 2 tablets by mouth daily.    . cetirizine (ZYRTEC) 10 MG tablet Take 10 mg by mouth at bedtime.    Marland Kitchen denosumab (XGEVA) 120 MG/1.7ML SOLN injection Inject 120 mg into the skin every 28 (twenty-eight) days.     Marland Kitchen ELIQUIS 5 MG TABS tablet Take 5 mg by mouth 2 (two) times daily.    . hydrocortisone (CORTEF) 10 MG tablet Take 10 mg by mouth 2 (two) times daily.     Marland Kitchen Leuprolide Acetate (ELIGARD [place]) Inject into the skin.    Marland Kitchen levothyroxine (SYNTHROID) 50 MCG tablet Take 50 mcg by mouth daily before breakfast.    . mirabegron ER (MYRBETRIQ) 25 MG TB24 tablet Take 25 mg by mouth daily.    . Misc Natural Products (OSTEO BI-FLEX ADV JOINT SHIELD PO) Take 1 tablet by mouth daily.     . Multiple Vitamin (MULTIVITAMIN WITH MINERALS) TABS tablet Take 1 tablet by mouth daily. One-A-Day Men's 5+    . NON FORMULARY Take 1 capsule by mouth at bedtime. Pure Encapsulations MotilPro    . polyethylene glycol (MIRALAX / GLYCOLAX) 17 g packet Take 17 g by mouth daily. W/coffee    . Probiotic Product (ALIGN) 4 MG CAPS Take 4 mg by mouth daily.     . rosuvastatin (CRESTOR) 20 MG tablet Take 20 mg by mouth daily.    . valsartan (DIOVAN) 320 MG tablet Take 320 mg by mouth daily.     No current facility-administered medications for this visit.    OBJECTIVE: Vitals:   09/01/20 0944  BP: 131/62  Pulse: (!) 52  Resp: 20  Temp: 97.8 F (36.6 C)  SpO2: 99%     Body mass index is 34.95 kg/m.    ECOG FS:0 - Asymptomatic  General: Well-developed, well-nourished, no acute distress. Eyes: Pink conjunctiva, anicteric sclera. HEENT: Normocephalic, moist mucous  membranes. Lungs: No audible wheezing or coughing. Heart: Regular rate and rhythm. Abdomen: Soft, nontender, no obvious distention. Musculoskeletal: No edema, cyanosis, or clubbing. Neuro: Alert, answering all questions appropriately. Cranial nerves grossly intact. Skin: No rashes or petechiae noted. Psych: Normal affect.  LAB RESULTS:  Lab Results  Component Value Date   NA 142 09/01/2020   K 3.8 09/01/2020   CL 109 09/01/2020   CO2 24 09/01/2020   GLUCOSE 131 (H) 09/01/2020   BUN 23 09/01/2020   CREATININE 0.93 09/01/2020   CALCIUM 8.3 (L) 09/01/2020   PROT 6.4 (L) 09/01/2020   ALBUMIN 3.7 09/01/2020   AST 33 09/01/2020  ALT 19 09/01/2020   ALKPHOS 54 09/01/2020   BILITOT 0.7 09/01/2020   GFRNONAA >60 09/01/2020   GFRAA >60 09/01/2020    Lab Results  Component Value Date   WBC 7.2 09/01/2020   NEUTROABS 4.8 09/01/2020   HGB 9.1 (L) 09/01/2020   HCT 29.5 (L) 09/01/2020   MCV 76.8 (L) 09/01/2020   PLT 203 09/01/2020     STUDIES: No results found.  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  PLAN:    1. 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  Patient's PSA was initially greater than 8.0, but now is nearly undetectable at 0.04. Continue Zytiga as directed.  Proceed with Xgeva today.  Patient last received Eligard on June 04, 2020.  Return to clinic in 4 weeks for further evaluation and continuation of treatment.   2.  Bony metastasis: Xgeva as above.  Patient's most recent calcium levels are adequate to proceed as scheduled.   3.  Urinary frequency/hematuria: Appreciate urology input.  Continue follow-up with them as indicated. 4.  Hot flashes: Likely second Eligard, monitor. 5.  Anemia: Patient's hemoglobin continues to trend down.  Will get iron stores with next lab draw.   Patient expressed understanding and was in agreement with  this plan. He also understands that He can call clinic at any time with any questions, concerns, or complaints.   Cancer Staging Prostate cancer [facility]) Staging form: Prostate, AJCC 8th Edition - Clinical stage from 02/17/2020: Stage IVB (cT2c, cN0, cM1b, PSA: 8.6, Grade Group: 5) - Signed by Lloyd Huger, MD on 02/17/2020   Lloyd Huger, MD   09/03/2020 6:37 AM

## 2020-08-31 ENCOUNTER — Other Ambulatory Visit: Payer: Medicare Other

## 2020-08-31 ENCOUNTER — Ambulatory Visit: Payer: Medicare Other | Admitting: Oncology

## 2020-08-31 ENCOUNTER — Encounter: Payer: Self-pay | Admitting: Oncology

## 2020-08-31 ENCOUNTER — Ambulatory Visit: Payer: Medicare Other

## 2020-09-01 ENCOUNTER — Inpatient Hospital Stay: Payer: Medicare Other | Attending: Oncology

## 2020-09-01 ENCOUNTER — Other Ambulatory Visit: Payer: Self-pay

## 2020-09-01 ENCOUNTER — Inpatient Hospital Stay (HOSPITAL_BASED_OUTPATIENT_CLINIC_OR_DEPARTMENT_OTHER): Payer: Medicare Other | Admitting: Oncology

## 2020-09-01 ENCOUNTER — Encounter: Payer: Self-pay | Admitting: Oncology

## 2020-09-01 ENCOUNTER — Inpatient Hospital Stay: Payer: Medicare Other

## 2020-09-01 VITALS — BP 131/62 | HR 52 | Temp 97.8°F | Resp 20 | Ht 69.5 in | Wt 240.1 lb

## 2020-09-01 DIAGNOSIS — Z806 Family history of leukemia: Secondary | ICD-10-CM | POA: Diagnosis not present

## 2020-09-01 DIAGNOSIS — Z87891 Personal history of nicotine dependence: Secondary | ICD-10-CM | POA: Diagnosis not present

## 2020-09-01 DIAGNOSIS — R319 Hematuria, unspecified: Secondary | ICD-10-CM | POA: Insufficient documentation

## 2020-09-01 DIAGNOSIS — Z801 Family history of malignant neoplasm of trachea, bronchus and lung: Secondary | ICD-10-CM | POA: Diagnosis not present

## 2020-09-01 DIAGNOSIS — C7951 Secondary malignant neoplasm of bone: Secondary | ICD-10-CM | POA: Diagnosis present

## 2020-09-01 DIAGNOSIS — D649 Anemia, unspecified: Secondary | ICD-10-CM | POA: Diagnosis not present

## 2020-09-01 DIAGNOSIS — R350 Frequency of micturition: Secondary | ICD-10-CM | POA: Insufficient documentation

## 2020-09-01 DIAGNOSIS — R232 Flushing: Secondary | ICD-10-CM | POA: Insufficient documentation

## 2020-09-01 DIAGNOSIS — R351 Nocturia: Secondary | ICD-10-CM | POA: Insufficient documentation

## 2020-09-01 DIAGNOSIS — C61 Malignant neoplasm of prostate: Secondary | ICD-10-CM

## 2020-09-01 LAB — COMPREHENSIVE METABOLIC PANEL
ALT: 19 U/L (ref 0–44)
AST: 33 U/L (ref 15–41)
Albumin: 3.7 g/dL (ref 3.5–5.0)
Alkaline Phosphatase: 54 U/L (ref 38–126)
Anion gap: 9 (ref 5–15)
BUN: 23 mg/dL (ref 8–23)
CO2: 24 mmol/L (ref 22–32)
Calcium: 8.3 mg/dL — ABNORMAL LOW (ref 8.9–10.3)
Chloride: 109 mmol/L (ref 98–111)
Creatinine, Ser: 0.93 mg/dL (ref 0.61–1.24)
GFR calc Af Amer: >60 mL/min (ref >60)
GFR calc non Af Amer: >60 mL/min (ref >60)
Glucose, Bld: 131 mg/dL — ABNORMAL HIGH (ref 70–99)
Potassium: 3.8 mmol/L (ref 3.5–5.1)
Sodium: 142 mmol/L (ref 135–145)
Total Bilirubin: 0.7 mg/dL (ref 0.3–1.2)
Total Protein: 6.4 g/dL — ABNORMAL LOW (ref 6.5–8.1)

## 2020-09-01 LAB — CBC WITH DIFFERENTIAL/PLATELET
Abs Immature Granulocytes: 0.03 10*3/uL (ref 0.00–0.07)
Basophils Absolute: 0 10*3/uL (ref 0.0–0.1)
Basophils Relative: 0 %
Eosinophils Absolute: 0 10*3/uL (ref 0.0–0.5)
Eosinophils Relative: 0 %
HCT: 29.5 % — ABNORMAL LOW (ref 39.0–52.0)
Hemoglobin: 9.1 g/dL — ABNORMAL LOW (ref 13.0–17.0)
Immature Granulocytes: 0 %
Lymphocytes Relative: 25 %
Lymphs Abs: 1.8 10*3/uL (ref 0.7–4.0)
MCH: 23.7 pg — ABNORMAL LOW (ref 26.0–34.0)
MCHC: 30.8 g/dL (ref 30.0–36.0)
MCV: 76.8 fL — ABNORMAL LOW (ref 80.0–100.0)
Monocytes Absolute: 0.5 10*3/uL (ref 0.1–1.0)
Monocytes Relative: 7 %
Neutro Abs: 4.8 10*3/uL (ref 1.7–7.7)
Neutrophils Relative %: 68 %
Platelets: 203 10*3/uL (ref 150–400)
RBC: 3.84 MIL/uL — ABNORMAL LOW (ref 4.22–5.81)
RDW: 16.7 % — ABNORMAL HIGH (ref 11.5–15.5)
WBC: 7.2 10*3/uL (ref 4.0–10.5)
nRBC: 0 % (ref 0.0–0.2)

## 2020-09-01 LAB — PSA: Prostatic Specific Antigen: 0.04 ng/mL (ref 0.00–4.00)

## 2020-09-01 MED ORDER — DENOSUMAB 120 MG/1.7ML ~~LOC~~ SOLN
120.0000 mg | Freq: Once | SUBCUTANEOUS | Status: AC
  Administered 2020-09-01: 120 mg via SUBCUTANEOUS
  Filled 2020-09-01: qty 1.7

## 2020-09-01 NOTE — Progress Notes (Signed)
Ca=8.3.  Proceed with xgeva today per Dr Woodfin Ganja

## 2020-09-01 NOTE — Progress Notes (Signed)
Patient reports today at follow up he is having trouble sleeping due to frequent trips to bathroom. He reports going 5 - 6 times last night. He also states he is having hot flashes, tips of fingers are red, swelling in ankles, and constipation. He states he has not been drinking a lot of fluid so he is not sure on why he still has been going as much. He also states he is not able to hold very well.

## 2020-09-20 ENCOUNTER — Other Ambulatory Visit: Payer: Self-pay

## 2020-09-20 DIAGNOSIS — R399 Unspecified symptoms and signs involving the genitourinary system: Secondary | ICD-10-CM

## 2020-09-20 MED ORDER — MIRABEGRON ER 50 MG PO TB24: 50.0000 mg | ORAL_TABLET | Freq: Every day | ORAL | 3 refills | Status: DC

## 2020-09-24 NOTE — Progress Notes (Signed)
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  INTERVAL HISTORY: Patient returns to clinic for further evaluation and continuation of [name].  He continues to have chronic urinary frequency and nocturia.  He continues to have occasional hot flashes.  He has no neurologic complaints.  He denies any recent fevers or illnesses.  He has a good appetite and denies weight loss.  He has no chest pain, shortness of breath, cough, or hemoptysis.  He denies any nausea, vomiting, constipation, or diarrhea. Patient offers no further specific complaints today.  REVIEW OF SYSTEMS:   Review of Systems  Constitutional: Negative.  Negative for fever, malaise/fatigue and weight loss.  Respiratory: Negative.  Negative for cough and hemoptysis.   Cardiovascular: Negative.  Negative for chest pain and leg swelling.  Gastrointestinal: Negative.  Negative for abdominal pain.  Genitourinary: Positive for frequency. Negative for hematuria.  Musculoskeletal: Negative.  Negative for back pain.  Skin: Negative.  Negative for rash.  Neurological: Positive for sensory change. Negative for dizziness, focal weakness, weakness and headaches.  Psychiatric/Behavioral: Negative.  The patient is not nervous/anxious.     As per HPI. Otherwise, a complete review of systems is negative.  PAST MEDICAL HISTORY: Past Medical History:  Diagnosis Date  . Chronic constipation   . Elevated prostate specific antigen (PSA)   . Family history of leukemia   . Family history of lung cancer   . History of kidney stones   . Hyperlipidemia     . Hypertension   . Hypothyroid   . Hypothyroidism   . Paroxysmal A-fib ([place])   . Sinus bradycardia   . Sleep apnea    uses CPAP  . Stroke ([place])    throat / slight difficulty swallowing    PAST SURGICAL HISTORY: Past Surgical History:  Procedure Laterality Date  . BACK SURGERY  1997   L3, 4, 5 laminectomy   . BACK SURGERY  1998   hard wire removed, fusion of L3-5  . BACK SURGERY  2009   laminotomy/foraminotomy w/ decompression of nerve root, facet thermal ablation L2-3.   Marland Kitchen HOLEP-LASER ENUCLEATION OF THE PROSTATE WITH MORCELLATION N/A 06/18/2020   Procedure: HOLEP-LASER ENUCLEATION OF THE PROSTATE WITH MORCELLATION;  Surgeon: Billey Co, MD;  Location: [facility] ORS;  Service: Urology;  Laterality: N/A;  . KIDNEY STONE SURGERY  2010  . KNEE SURGERY Left 2007   Arthroscopic partial knee replacement, replaced medial side   . MOHS SURGERY Right 2014   squamous cell carcinoma  . NECK SURGERY  2008   fusion with hardwire in place C3 through C7   . pituitary adenoma  1994   benign  . PROSTATE BIOPSY N/A 01/01/2020   Procedure: PROSTATE BIOPSY URO NAV FUSION;  Surgeon: Royston Cowper, MD;  Location: [facility] ORS;  Service: Urology;  Laterality: N/A;  . SPINE SURGERY    . SPINE SURGERY  2016   fusion L4     FAMILY HISTORY: Family History  Problem Relation Age of Onset  . Stroke Mother   . Heart attack Father   . Leukemia Sister 54  . Lung cancer Brother 73    ADVANCED DIRECTIVES (Y/N):  N  HEALTH MAINTENANCE: Social History   Tobacco Use  . Smoking status: Former Research scientist (life sciences)  . Smokeless tobacco: Never Used  . Tobacco comment: quit 50 years ago  Vaping Use  . Vaping Use: Never used  Substance Use Topics  . Alcohol use: Yes    Comment: rarely  . Drug use: Never     Colonoscopy:  PAP:  Bone density:  Lipid panel:  Allergies  Allergen Reactions  . Metoprolol Other (See Comments)    dizziness   . Rivaroxaban Other (See Comments)    hematuria     Current  Outpatient Medications  Medication Sig Dispense Refill  . abiraterone acetate (ZYTIGA) 250 MG tablet Take 4 tablets (1,000 mg total) by mouth daily. Take on an empty stomach 1 hour before or 2 hours after a meal 120 tablet 2  . B Complex-C (SUPER B COMPLEX PO) Take 1 capsule by mouth daily.     . Calcium-Magnesium-Vitamin D (CALCIUM 1200+D3 PO) Take 2 tablets by mouth daily.    . cetirizine (ZYRTEC) 10 MG tablet Take 10 mg by mouth at bedtime.    Marland Kitchen denosumab (XGEVA) 120 MG/1.7ML SOLN injection Inject 120 mg into the skin every 28 (twenty-eight) days.     Marland Kitchen ELIQUIS 5 MG TABS tablet Take 5 mg by mouth 2 (two) times daily.    . hydrocortisone (CORTEF) 10 MG tablet Take 10 mg by mouth 2 (two) times daily.     Marland Kitchen Leuprolide Acetate (ELIGARD [place]) Inject into the skin.    Marland Kitchen levothyroxine (SYNTHROID) 50 MCG tablet Take 50 mcg by mouth daily before breakfast.    . mirabegron ER (MYRBETRIQ) 50 MG TB24 tablet Take 1 tablet (50 mg total) by mouth daily. 30 tablet 3  . Misc Natural Products (OSTEO BI-FLEX ADV JOINT SHIELD PO) Take 1 tablet by mouth daily.     . Multiple Vitamin (MULTIVITAMIN WITH MINERALS) TABS tablet Take 1 tablet by mouth daily. One-A-Day Men's 53+    . NON FORMULARY Take 1 capsule by mouth at bedtime. Pure Encapsulations MotilPro    . polyethylene glycol (MIRALAX / GLYCOLAX) 17 g packet Take 17 g by mouth daily. W/coffee    . Probiotic Product (ALIGN) 4 MG CAPS Take 4 mg by mouth daily.     . rosuvastatin (CRESTOR) 20 MG tablet Take 20 mg by mouth daily.    . valsartan (DIOVAN) 320 MG tablet Take 320 mg by mouth daily.     No current facility-administered medications for this visit.    OBJECTIVE: Vitals:   09/29/20 1023  BP: 138/66  Pulse: (!) 59  Temp: 98.6 F (37 C)  SpO2: 99%     Body mass index is 35.44 kg/m.    ECOG FS:0 - Asymptomatic   General: Well-developed, well-nourished, no acute distress. Eyes: Pink conjunctiva, anicteric sclera. HEENT: Normocephalic, moist  mucous membranes. Lungs: No audible wheezing or coughing. Heart: Regular rate and rhythm. Abdomen: Soft, nontender, no obvious distention. Musculoskeletal: No edema, cyanosis, or clubbing. Neuro: Alert, answering all questions appropriately. Cranial nerves grossly intact. Skin: No rashes or petechiae noted. Psych: Normal affect.   LAB RESULTS:  Lab Results  Component Value Date   NA 142 09/29/2020   K 3.9 09/29/2020   CL 109 09/29/2020   CO2 24 09/29/2020   GLUCOSE 169 (H) 09/29/2020   BUN 18 09/29/2020   CREATININE 0.98 09/29/2020   CALCIUM 8.5 (L) 09/29/2020   PROT 6.2 (L) 09/29/2020   ALBUMIN 3.7 09/29/2020   AST 28  09/29/2020   ALT 15 09/29/2020   ALKPHOS 54 09/29/2020   BILITOT 0.8 09/29/2020   GFRNONAA >60 09/29/2020   GFRAA >60 09/01/2020    Lab Results  Component Value Date   WBC 7.8 09/29/2020   NEUTROABS 5.8 09/29/2020   HGB 8.6 (L) 09/29/2020   HCT 28.9 (L) 09/29/2020   MCV 76.3 (L) 09/29/2020   PLT 215 09/29/2020     STUDIES: No results found.  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  PLAN:    1. 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  Patient's PSA was initially greater than 8.0, but now is nearly undetectable at 0.04, today's results are pending. Continue Zytiga as directed. Proceed with Xgeva today. Patient last received Eligard on June 04, 2020. Return to clinic in 4 weeks for further evaluation and continuation of treatment. 2.  Bony metastasis: Xgeva as above.  Patient's most recent calcium levels are adequate to proceed as scheduled.   3.  Urinary frequency/hematuria: Appreciate urology input.  Continue follow-up with them as indicated. 4.  Hot flashes: Likely second Eligard, monitor. 5.  Anemia: Patient's hemoglobin continues to trend down and his iron stores are reduced.  Will add IV Feraheme with next  appointment.  Patient expressed understanding and was in agreement with this plan. He also understands that He can call clinic at any time with any questions, concerns, or complaints.   Cancer Staging Prostate cancer [facility]) Staging form: Prostate, AJCC 8th Edition - Clinical stage from 02/17/2020: Stage IVB (cT2c, cN0, cM1b, PSA: 8.6, Grade Group: 5) - Signed by Lloyd Huger, MD on 02/17/2020   Lloyd Huger, MD   09/29/2020 1:11 PM

## 2020-09-29 ENCOUNTER — Inpatient Hospital Stay (HOSPITAL_BASED_OUTPATIENT_CLINIC_OR_DEPARTMENT_OTHER): Payer: Medicare Other | Admitting: Oncology

## 2020-09-29 ENCOUNTER — Other Ambulatory Visit: Payer: Self-pay

## 2020-09-29 ENCOUNTER — Inpatient Hospital Stay: Payer: Medicare Other

## 2020-09-29 ENCOUNTER — Encounter: Payer: Self-pay | Admitting: Oncology

## 2020-09-29 ENCOUNTER — Inpatient Hospital Stay: Payer: Medicare Other | Attending: Oncology

## 2020-09-29 VITALS — BP 138/66 | HR 59 | Temp 98.6°F | Wt 243.5 lb

## 2020-09-29 DIAGNOSIS — C7951 Secondary malignant neoplasm of bone: Secondary | ICD-10-CM | POA: Insufficient documentation

## 2020-09-29 DIAGNOSIS — C61 Malignant neoplasm of prostate: Secondary | ICD-10-CM

## 2020-09-29 LAB — CBC WITH DIFFERENTIAL/PLATELET
Abs Immature Granulocytes: 0.05 10*3/uL (ref 0.00–0.07)
Basophils Absolute: 0 10*3/uL (ref 0.0–0.1)
Basophils Relative: 0 %
Eosinophils Absolute: 0 10*3/uL (ref 0.0–0.5)
Eosinophils Relative: 0 %
HCT: 28.9 % — ABNORMAL LOW (ref 39.0–52.0)
Hemoglobin: 8.6 g/dL — ABNORMAL LOW (ref 13.0–17.0)
Immature Granulocytes: 1 %
Lymphocytes Relative: 17 %
Lymphs Abs: 1.3 10*3/uL (ref 0.7–4.0)
MCH: 22.7 pg — ABNORMAL LOW (ref 26.0–34.0)
MCHC: 29.8 g/dL — ABNORMAL LOW (ref 30.0–36.0)
MCV: 76.3 fL — ABNORMAL LOW (ref 80.0–100.0)
Monocytes Absolute: 0.6 10*3/uL (ref 0.1–1.0)
Monocytes Relative: 7 %
Neutro Abs: 5.8 10*3/uL (ref 1.7–7.7)
Neutrophils Relative %: 75 %
Platelets: 215 10*3/uL (ref 150–400)
RBC: 3.79 MIL/uL — ABNORMAL LOW (ref 4.22–5.81)
RDW: 18.1 % — ABNORMAL HIGH (ref 11.5–15.5)
WBC: 7.8 10*3/uL (ref 4.0–10.5)
nRBC: 0 % (ref 0.0–0.2)

## 2020-09-29 LAB — COMPREHENSIVE METABOLIC PANEL
ALT: 15 U/L (ref 0–44)
AST: 28 U/L (ref 15–41)
Albumin: 3.7 g/dL (ref 3.5–5.0)
Alkaline Phosphatase: 54 U/L (ref 38–126)
Anion gap: 9 (ref 5–15)
BUN: 18 mg/dL (ref 8–23)
CO2: 24 mmol/L (ref 22–32)
Calcium: 8.5 mg/dL — ABNORMAL LOW (ref 8.9–10.3)
Chloride: 109 mmol/L (ref 98–111)
Creatinine, Ser: 0.98 mg/dL (ref 0.61–1.24)
GFR, Estimated: >60 mL/min (ref >60)
Glucose, Bld: 169 mg/dL — ABNORMAL HIGH (ref 70–99)
Potassium: 3.9 mmol/L (ref 3.5–5.1)
Sodium: 142 mmol/L (ref 135–145)
Total Bilirubin: 0.8 mg/dL (ref 0.3–1.2)
Total Protein: 6.2 g/dL — ABNORMAL LOW (ref 6.5–8.1)

## 2020-09-29 LAB — IRON AND TIBC
Iron: 34 ug/dL — ABNORMAL LOW (ref 45–182)
Saturation Ratios: 9 % — ABNORMAL LOW (ref 17.9–39.5)
TIBC: 398 ug/dL (ref 250–450)
UIBC: 364 ug/dL

## 2020-09-29 LAB — VITAMIN B12: Vitamin B-12: 674 pg/mL (ref 180–914)

## 2020-09-29 LAB — FERRITIN: Ferritin: 5 ng/mL — ABNORMAL LOW (ref 24–336)

## 2020-09-29 LAB — FOLATE: Folate: 42 ng/mL (ref >5.9)

## 2020-09-29 MED ORDER — DENOSUMAB 120 MG/1.7ML ~~LOC~~ SOLN
120.0000 mg | Freq: Once | SUBCUTANEOUS | Status: AC
  Administered 2020-09-29: 120 mg via SUBCUTANEOUS
  Filled 2020-09-29: qty 1.7

## 2020-09-29 NOTE — Progress Notes (Signed)
Patient here today still reports swollen ankles trying compression stockings to help. States still having issue with sleeping and with going to bathroom frequently. States since prostate surgery in July nothing has seemed to gotten better.

## 2020-10-04 ENCOUNTER — Encounter: Payer: Self-pay | Admitting: Oncology

## 2020-10-05 ENCOUNTER — Other Ambulatory Visit: Payer: Self-pay | Admitting: Oncology

## 2020-10-08 DIAGNOSIS — E2749 Other adrenocortical insufficiency: Secondary | ICD-10-CM | POA: Insufficient documentation

## 2020-10-08 DIAGNOSIS — E038 Other specified hypothyroidism: Secondary | ICD-10-CM | POA: Insufficient documentation

## 2020-10-19 ENCOUNTER — Ambulatory Visit (INDEPENDENT_AMBULATORY_CARE_PROVIDER_SITE_OTHER): Payer: Medicare Other | Admitting: Dermatology

## 2020-10-19 ENCOUNTER — Other Ambulatory Visit: Payer: Self-pay

## 2020-10-19 DIAGNOSIS — L720 Epidermal cyst: Secondary | ICD-10-CM

## 2020-10-19 DIAGNOSIS — D1800 Hemangioma unspecified site: Secondary | ICD-10-CM

## 2020-10-19 DIAGNOSIS — L820 Inflamed seborrheic keratosis: Secondary | ICD-10-CM | POA: Diagnosis not present

## 2020-10-19 DIAGNOSIS — Z1283 Encounter for screening for malignant neoplasm of skin: Secondary | ICD-10-CM

## 2020-10-19 DIAGNOSIS — D229 Melanocytic nevi, unspecified: Secondary | ICD-10-CM

## 2020-10-19 DIAGNOSIS — L853 Xerosis cutis: Secondary | ICD-10-CM

## 2020-10-19 DIAGNOSIS — L578 Other skin changes due to chronic exposure to nonionizing radiation: Secondary | ICD-10-CM

## 2020-10-19 DIAGNOSIS — L814 Other melanin hyperpigmentation: Secondary | ICD-10-CM

## 2020-10-19 DIAGNOSIS — L821 Other seborrheic keratosis: Secondary | ICD-10-CM

## 2020-10-19 DIAGNOSIS — Z85828 Personal history of other malignant neoplasm of skin: Secondary | ICD-10-CM

## 2020-10-19 DIAGNOSIS — L918 Other hypertrophic disorders of the skin: Secondary | ICD-10-CM | POA: Diagnosis not present

## 2020-10-19 DIAGNOSIS — L905 Scar conditions and fibrosis of skin: Secondary | ICD-10-CM

## 2020-10-19 NOTE — Patient Instructions (Addendum)
Gentle Skin Care Guide  1. Bathe no more than once a day.  2. Avoid bathing in hot water  3. Use a mild soap like Dove, Vanicream, Cetaphil, CeraVe. Can use Lever 2000 or Cetaphil antibacterial soap  4. Use soap only where you need it. On most days, use it under your arms, between your legs, and on your feet. Let the water rinse other areas unless visibly dirty.  5. When you get out of the bath/shower, use a towel to gently blot your skin dry, don't rub it.  6. While your skin is still a little damp, apply a moisturizing cream such as Vanicream, CeraVe, Cetaphil, Eucerin, Sarna lotion or plain Vaseline Jelly. For hands apply Neutrogena [place] Hand Cream or Excipial Hand Cream.  7. Reapply moisturizer any time you start to itch or feel dry.  8. Sometimes using free and clear laundry detergents can be helpful. Fabric softener sheets should be avoided. Downy Free & Gentle liquid, or any liquid fabric softener that is free of dyes and perfumes, it acceptable to use  9. If your doctor has given you prescription creams you may apply moisturizers over them    Seborrheic Keratosis  What causes seborrheic keratoses? Seborrheic keratoses are harmless, common skin growths that first appear during adult life.  As time goes by, more growths appear.  Some people may develop a large number of them.  Seborrheic keratoses appear on both covered and uncovered body parts.  They are not caused by sunlight.  The tendency to develop seborrheic keratoses can be inherited.  They vary in color from skin-colored to gray, brown, or even black.  They can be either smooth or have a rough, warty surface.   Seborrheic keratoses are superficial and look as if they were stuck on the skin.  Under the microscope this type of keratosis looks like layers upon layers of skin.  That is why at times the top layer may seem to fall off, but the rest of the growth remains and re-grows.    Treatment Seborrheic keratoses do not  need to be treated, but can easily be removed in the office.  Seborrheic keratoses often cause symptoms when they rub on clothing or jewelry.  Lesions can be in the way of shaving.  If they become inflamed, they can cause itching, soreness, or burning.  Removal of a seborrheic keratosis can be accomplished by freezing, burning, or surgery. If any spot bleeds, scabs, or grows rapidly, please return to have it checked, as these can be an indication of a skin cancer.

## 2020-10-19 NOTE — Progress Notes (Signed)
Follow-Up Visit   Subjective  Andrew Callahan is a 86 y.o. male who presents for the following: Annual Exam (Upper Body Exam today. Hx SCC).  Pt has a hx of SCC on right palm that was treated with [facility] in 2014. Pt c/o of spots under right arm that frequently get caught in clothing and few spots on scalp that catch when combing hair.  Also spot behind his ear that catches on glasses, masks, hearing aid and raised scaly spots on back that itch.  The following portions of the chart were reviewed this encounter and updated as appropriate:     Review of Systems: No other skin or systemic complaints except as noted in HPI or Assessment and Plan.   Objective  Well appearing patient in no apparent distress; mood and affect are within normal limits.  All skin waist up examined.  Objective  bilateral hands: diffuse xerotic patches  Objective  Right Axilla, neck: Fleshy, skin-colored pedunculated papules.    Objective  Right Upper Back: Subcutaneous nodule.   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   Assessment & Plan  Xerosis cutis bilateral hands  Likely from frequent hand washing   Recommend mild soap like Dove when washing hands and using a moisturizer after. Aveeno, Eucerin, Amlactin samples given today.   Skin tag Right Axilla, neck  Benign, observe.    Epidermal cyst Right Upper Back  Benign-appearing. Exam most consistent with an epidermal inclusion cyst. Discussed that a cyst is a benign growth that can grow over time and sometimes get irritated or inflamed. Recommend observation if it is not bothersome. Discussed option of surgical excision to remove it if it is growing, symptomatic, or other changes noted. Please call for new or  changing lesions so they can be evaluated.       Scar Left lateral cheek  Benign, observe.    Inflamed seborrheic keratosis (15) Right axillary area x 6, crown scalp x 2, right post auricular x 1, back x 6  Prior to procedure, discussed risks of blister formation, small wound, skin dyspigmentation, or rare scar following cryotherapy.    Destruction of lesion - Right axillary area x 6, crown scalp x 2, right post auricular x 1, back x 6  Destruction method: cryotherapy   Informed consent: discussed and consent obtained   Lesion destroyed using liquid nitrogen: Yes   Region frozen until ice ball extended beyond lesion: Yes   Outcome: patient tolerated procedure well with no complications   Post-procedure details: wound care instructions given     Xerosis - diffuse xerotic patches - recommend gentle, hydrating skin care - gentle skin care handout given   Lentigines - Scattered tan macules - Discussed due to sun exposure - Benign, observe - Call for any changes  Seborrheic Keratoses  - Stuck-on, waxy, tan-brown papules and plaques  - Discussed benign etiology and prognosis. - Observe - Call for any changes  Melanocytic Nevi - Tan-brown and/or pink-flesh-colored symmetric macules and papules - Benign appearing on exam today - Observation - Call clinic for new or changing moles - Recommend daily use of broad spectrum spf 30+ sunscreen to sun-exposed areas.   Hemangiomas - Red papules - Discussed benign nature - Observe - Call for  any changes  Actinic Damage - Chronic, secondary to cumulative UV/sun exposure - diffuse scaly erythematous macules with underlying dyspigmentation - Recommend daily broad spectrum sunscreen SPF 30+ to sun-exposed areas, reapply every 2 hours as needed.  - Call for new or changing lesions.  Skin cancer screening performed today.  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  - Call if any new or changing lesions are noted between office visits   Return in about 1 year (around 10/19/2021) for UBSE.   I, Harriett Sine, CMA, am acting as scribe for Brendolyn Patty, MD.  Documentation: I have reviewed the above documentation for accuracy and completeness, and I agree with the above.  Brendolyn Patty MD

## 2020-10-23 NOTE — Progress Notes (Signed)
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  INTERVAL HISTORY: Patient returns to clinic today for further evaluation and continuation of [name].  He continues to have chronic urinary frequency and nocturia.  He has no neurologic complaints.  He denies any recent fevers or illnesses.  He has a good appetite and denies weight loss.  He has no chest pain, shortness of breath, cough, or hemoptysis.  He denies any nausea, vomiting, constipation, or diarrhea. Patient offers no further specific complaints today.  REVIEW OF SYSTEMS:   Review of Systems  Constitutional: Positive for malaise/fatigue. Negative for fever and weight loss.  Respiratory: Negative.  Negative for cough and hemoptysis.   Cardiovascular: Negative.  Negative for chest pain and leg swelling.  Gastrointestinal: Negative.  Negative for abdominal pain.  Genitourinary: Positive for frequency. Negative for hematuria.  Musculoskeletal: Negative.  Negative for back pain.  Skin: Negative.  Negative for rash.  Neurological: Negative.  Negative for dizziness, sensory change, focal weakness, weakness and headaches.  Psychiatric/Behavioral: Negative.  The patient is not nervous/anxious.     As per HPI. Otherwise, a complete review of systems is negative.  PAST MEDICAL HISTORY: Past Medical History:  Diagnosis Date  . Chronic constipation   . Elevated prostate specific antigen (PSA)   . Family history of leukemia   . Family history of lung cancer   . History of kidney stones   . Hyperlipidemia   . Hypertension   . Hypothyroid   .  Hypothyroidism   . Paroxysmal A-fib ([place])   . Sinus bradycardia   . Sleep apnea    uses CPAP  . Squamous cell carcinoma of skin 2014   Right hand. Mohs.  . Stroke ([place])    throat / slight difficulty swallowing    PAST SURGICAL HISTORY: Past Surgical History:  Procedure Laterality Date  . BACK SURGERY  1997   L3, 4, 5 laminectomy   . BACK SURGERY  1998   hard wire removed, fusion of L3-5  . BACK SURGERY  2009   laminotomy/foraminotomy w/ decompression of nerve root, facet thermal ablation L2-3.   Marland Kitchen HOLEP-LASER ENUCLEATION OF THE PROSTATE WITH MORCELLATION N/A 06/18/2020   Procedure: HOLEP-LASER ENUCLEATION OF THE PROSTATE WITH MORCELLATION;  Surgeon: Billey Co, MD;  Location: [facility] ORS;  Service: Urology;  Laterality: N/A;  . KIDNEY STONE SURGERY  2010  . KNEE SURGERY Left 2007   Arthroscopic partial knee replacement, replaced medial side   . MOHS SURGERY Right 2014   squamous cell carcinoma  . NECK SURGERY  2008   fusion with hardwire in place C3 through C7   . pituitary adenoma  1994   benign  . PROSTATE BIOPSY N/A 01/01/2020   Procedure: PROSTATE BIOPSY URO NAV FUSION;  Surgeon: Royston Cowper, MD;  Location: [facility] ORS;  Service: Urology;  Laterality: N/A;  . SPINE SURGERY    . SPINE SURGERY  2016   fusion L4     FAMILY HISTORY: Family History  Problem Relation Age of Onset  . Stroke Mother   . Heart attack Father   . Leukemia Sister 12  . Lung cancer Brother 38  ADVANCED DIRECTIVES (Y/N):  N  HEALTH MAINTENANCE: Social History   Tobacco Use  . Smoking status: Former Research scientist (life sciences)  . Smokeless tobacco: Never Used  . Tobacco comment: quit 50 years ago  Vaping Use  . Vaping Use: Never used  Substance Use Topics  . Alcohol use: Yes    Comment: rarely  . Drug use: Never     Colonoscopy:  PAP:  Bone density:  Lipid panel:  Allergies  Allergen Reactions  . Metoprolol Other (See Comments)    dizziness   . Rivaroxaban Other (See Comments)     hematuria     Current Outpatient Medications  Medication Sig Dispense Refill  . abiraterone acetate (ZYTIGA) 250 MG tablet Take 4 tablets (1,000 mg total) by mouth daily. Take on an empty stomach 1 hour before or 2 hours after a meal 120 tablet 2  . B Complex-C (SUPER B COMPLEX PO) Take 1 capsule by mouth daily.     . Calcium-Magnesium-Vitamin D (CALCIUM 1200+D3 PO) Take 2 tablets by mouth daily.    . cetirizine (ZYRTEC) 10 MG tablet Take 10 mg by mouth at bedtime.    Marland Kitchen denosumab (XGEVA) 120 MG/1.7ML SOLN injection Inject 120 mg into the skin every 28 (twenty-eight) days.     Marland Kitchen ELIQUIS 5 MG TABS tablet Take 5 mg by mouth 2 (two) times daily.    . hydrocortisone (CORTEF) 10 MG tablet Take 10 mg by mouth 2 (two) times daily.     Marland Kitchen Leuprolide Acetate (ELIGARD [place]) Inject into the skin.    Marland Kitchen levothyroxine (SYNTHROID) 50 MCG tablet Take 50 mcg by mouth daily before breakfast.    . Misc Natural Products (OSTEO BI-FLEX ADV JOINT SHIELD PO) Take 1 tablet by mouth daily.     . Multiple Vitamin (MULTIVITAMIN WITH MINERALS) TABS tablet Take 1 tablet by mouth daily. One-A-Day Men's 10+    . NON FORMULARY Take 1 capsule by mouth at bedtime. Pure Encapsulations MotilPro    . polyethylene glycol (MIRALAX / GLYCOLAX) 17 g packet Take 17 g by mouth daily. W/coffee    . Probiotic Product (ALIGN) 4 MG CAPS Take 4 mg by mouth daily.     . rosuvastatin (CRESTOR) 20 MG tablet Take 20 mg by mouth daily.    . valsartan (DIOVAN) 320 MG tablet Take 320 mg by mouth daily.    . Trospium Chloride 60 MG CP24 Take 1 capsule (60 mg total) by mouth daily. 30 capsule 3   No current facility-administered medications for this visit.    OBJECTIVE: Vitals:   10/27/20 1039  BP: (!) 183/68  Pulse: (!) 55  Temp: 98.1 F (36.7 C)     Body mass index is 36.1 kg/m.    ECOG FS:0 - Asymptomatic  General: Well-developed, well-nourished, no acute distress. Eyes: Pink conjunctiva, anicteric sclera. HEENT: Normocephalic, moist  mucous membranes. Lungs: No audible wheezing or coughing. Heart: Regular rate and rhythm. Abdomen: Soft, nontender, no obvious distention. Musculoskeletal: No edema, cyanosis, or clubbing. Neuro: Alert, answering all questions appropriately. Cranial nerves grossly intact. Skin: No rashes or petechiae noted. Psych: Normal affect.   LAB RESULTS:  Lab Results  Component Value Date   NA 141 10/27/2020   K 4.0 10/27/2020   CL 107 10/27/2020   CO2 24 10/27/2020   GLUCOSE 129 (H) 10/27/2020   BUN 16 10/27/2020   CREATININE 0.93 10/27/2020   CALCIUM 9.3 10/27/2020   PROT 6.7 10/27/2020   ALBUMIN 3.8 10/27/2020   AST 30 10/27/2020  ALT 16 10/27/2020   ALKPHOS 61 10/27/2020   BILITOT 0.7 10/27/2020   GFRNONAA >60 10/27/2020   GFRAA >60 09/01/2020    Lab Results  Component Value Date   WBC 7.7 10/27/2020   NEUTROABS 5.9 10/27/2020   HGB 10.9 (L) 10/27/2020   HCT 36.0 (L) 10/27/2020   MCV 82.4 10/27/2020   PLT 224 10/27/2020     STUDIES: No results found.  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  PLAN:    1. 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  Patient's PSA was initially greater than 8.0, but now is undetectable at less than 0.1.  Continue Zytiga as directed. Proceed with Xgeva today. Patient last received Eligard on June 04, 2020. Return to clinic in 4 weeks for further evaluation and continuation of treatment. 2.  Bony metastasis: Xgeva as above.  Patient's most recent calcium levels are adequate to proceed as scheduled.   3.  Urinary frequency/hematuria: Patient reports he has follow-up with urology later this week. 4.  Hot flashes: Patient does not complain of this today. Likely second Eligard, monitor. 5.  Anemia: Patient's hemoglobin has improved to 10.9. Continue to monitor. He does not require IV Feraheme at this time.  Patient  expressed understanding and was in agreement with this plan. He also understands that He can call clinic at any time with any questions, concerns, or complaints.   Cancer Staging Prostate cancer [facility]) Staging form: Prostate, AJCC 8th Edition - Clinical stage from 02/17/2020: Stage IVB (cT2c, cN0, cM1b, PSA: 8.6, Grade Group: 5) - Signed by Lloyd Huger, MD on 02/17/2020   Lloyd Huger, MD   10/30/2020 7:29 AM

## 2020-10-26 ENCOUNTER — Encounter: Payer: Self-pay | Admitting: Oncology

## 2020-10-26 NOTE — Progress Notes (Signed)
Patient states he is having frequent urination at night. Has an appointment with urology this week. Patient states with this frequent urination at night he is more tired during the day.

## 2020-10-27 ENCOUNTER — Inpatient Hospital Stay: Payer: Medicare Other

## 2020-10-27 ENCOUNTER — Encounter: Payer: Self-pay | Admitting: Urology

## 2020-10-27 ENCOUNTER — Ambulatory Visit (INDEPENDENT_AMBULATORY_CARE_PROVIDER_SITE_OTHER): Payer: Medicare Other | Admitting: Urology

## 2020-10-27 ENCOUNTER — Other Ambulatory Visit: Payer: Self-pay

## 2020-10-27 ENCOUNTER — Encounter: Payer: Self-pay | Admitting: Oncology

## 2020-10-27 ENCOUNTER — Inpatient Hospital Stay (HOSPITAL_BASED_OUTPATIENT_CLINIC_OR_DEPARTMENT_OTHER): Payer: Medicare Other | Admitting: Oncology

## 2020-10-27 ENCOUNTER — Ambulatory Visit: Payer: Medicare Other

## 2020-10-27 ENCOUNTER — Inpatient Hospital Stay: Payer: Medicare Other | Attending: Oncology

## 2020-10-27 VITALS — BP 183/68 | HR 55 | Temp 98.1°F | Wt 248.0 lb

## 2020-10-27 VITALS — BP 189/93 | HR 60 | Ht 70.0 in | Wt 247.0 lb

## 2020-10-27 DIAGNOSIS — N3281 Overactive bladder: Secondary | ICD-10-CM

## 2020-10-27 DIAGNOSIS — C61 Malignant neoplasm of prostate: Secondary | ICD-10-CM | POA: Diagnosis present

## 2020-10-27 DIAGNOSIS — R399 Unspecified symptoms and signs involving the genitourinary system: Secondary | ICD-10-CM | POA: Diagnosis not present

## 2020-10-27 DIAGNOSIS — C7951 Secondary malignant neoplasm of bone: Secondary | ICD-10-CM | POA: Diagnosis present

## 2020-10-27 LAB — COMPREHENSIVE METABOLIC PANEL
ALT: 16 U/L (ref 0–44)
AST: 30 U/L (ref 15–41)
Albumin: 3.8 g/dL (ref 3.5–5.0)
Alkaline Phosphatase: 61 U/L (ref 38–126)
Anion gap: 10 (ref 5–15)
BUN: 16 mg/dL (ref 8–23)
CO2: 24 mmol/L (ref 22–32)
Calcium: 9.3 mg/dL (ref 8.9–10.3)
Chloride: 107 mmol/L (ref 98–111)
Creatinine, Ser: 0.93 mg/dL (ref 0.61–1.24)
GFR, Estimated: >60 mL/min (ref >60)
Glucose, Bld: 129 mg/dL — ABNORMAL HIGH (ref 70–99)
Potassium: 4 mmol/L (ref 3.5–5.1)
Sodium: 141 mmol/L (ref 135–145)
Total Bilirubin: 0.7 mg/dL (ref 0.3–1.2)
Total Protein: 6.7 g/dL (ref 6.5–8.1)

## 2020-10-27 LAB — CBC WITH DIFFERENTIAL/PLATELET
Abs Immature Granulocytes: 0.04 10*3/uL (ref 0.00–0.07)
Basophils Absolute: 0 10*3/uL (ref 0.0–0.1)
Basophils Relative: 1 %
Eosinophils Absolute: 0.1 10*3/uL (ref 0.0–0.5)
Eosinophils Relative: 1 %
HCT: 36 % — ABNORMAL LOW (ref 39.0–52.0)
Hemoglobin: 10.9 g/dL — ABNORMAL LOW (ref 13.0–17.0)
Immature Granulocytes: 1 %
Lymphocytes Relative: 14 %
Lymphs Abs: 1.1 10*3/uL (ref 0.7–4.0)
MCH: 24.9 pg — ABNORMAL LOW (ref 26.0–34.0)
MCHC: 30.3 g/dL (ref 30.0–36.0)
MCV: 82.4 fL (ref 80.0–100.0)
Monocytes Absolute: 0.6 10*3/uL (ref 0.1–1.0)
Monocytes Relative: 7 %
Neutro Abs: 5.9 10*3/uL (ref 1.7–7.7)
Neutrophils Relative %: 76 %
Platelets: 224 10*3/uL (ref 150–400)
RBC: 4.37 MIL/uL (ref 4.22–5.81)
RDW: 26.2 % — ABNORMAL HIGH (ref 11.5–15.5)
WBC: 7.7 10*3/uL (ref 4.0–10.5)
nRBC: 0 % (ref 0.0–0.2)

## 2020-10-27 LAB — BLADDER SCAN AMB NON-IMAGING

## 2020-10-27 MED ORDER — TROSPIUM CHLORIDE ER 60 MG PO CP24: 60.0000 mg | ORAL_CAPSULE | Freq: Every day | ORAL | 3 refills | Status: DC

## 2020-10-27 MED ORDER — DENOSUMAB 120 MG/1.7ML ~~LOC~~ SOLN
120.0000 mg | Freq: Once | SUBCUTANEOUS | Status: AC
  Administered 2020-10-27: 120 mg via SUBCUTANEOUS

## 2020-10-27 NOTE — Progress Notes (Signed)
Cystoscopy Procedure Note:  Indication: Urinary frequency, urgency, nocturia  Urinalysis today benign, PVR is normal at 62 mL  86 year old male with history of stroke and metastatic prostate cancer, as well as urinary retention and elevated PVRs.  He underwent an uncomplicated HOLEP on 04/18/9356.  He has been voiding with a very strong stream since surgery, but continues to have persistent bothersome overactive symptoms including urgency, frequency every 2 hours during the day, and nocturia.  He is compliant with his sleep apnea machine.  After informed consent and discussion of the procedure and its risks, Andrew Callahan was positioned and prepped in the standard fashion. Cystoscopy was performed with a flexible cystoscope. The urethra, bladder neck and entire bladder was visualized in a standard fashion. The prostatic fossa was wide open after HOLEP and verumontanum intact.  Mild bladder trabeculations, but no bladder lesions. The ureteral orifices were visualized in their normal location and orientation.  He had appropriate control of the sphincter under direct vision.  Findings: Expected post HOLEP findings of wide open prostatic fossa, verumontanum and sphincter intact, normal appearing bladder  ------------------------------------------------------------------------------------------------------------------------  Assessment and Plan: I suspect his persistent urinary symptoms are secondary to overactive bladder, potentially from his longstanding prostatic obstruction, history of stroke, and obesity.  We again reviewed behavioral strategies, including compression stockings, elevating the legs in the afternoon to prevent nocturia, timed voiding, and urination prior to bedtime.  We discussed that overactive bladder (OAB) is not a disease, but is a symptom complex that is generally not life-threatening.  Symptoms typically include urinary urgency, frequency, and urge incontinence.  There are  numerous treatment options, however there are risks and benefits with both medical and surgical management.  First-line treatment is behavioral therapies including bladder training, pelvic floor muscle training, and fluid management.  Second line treatments include oral antimuscarinics(Ditropan er, Trospium) and beta-3 agonist (Mybetriq). There is typically a period of medication trial (4-8 weeks) to find the optimal therapy and dosing. If symptoms are bothersome despite the above management, third line options include intra-detrusor botox, peripheral tibial nerve stimulation (PTNS), and interstim (SNS). These are more invasive treatments with higher side effect profile, but may improve quality of life for patients with severe OAB symptoms.   -Trial of trospium 60 mg XL for OAB symptoms -RTC 1 month virtual visit -If persistent symptoms at that time, schedule PTNS.  Also would recommend a voiding diary to monitor urine volume during day versus overnight -Okay to use condom catheter overnight  Andrew Madrid, MD 10/27/2020

## 2020-10-27 NOTE — Patient Instructions (Addendum)
Wear compression stockings for the swelling in your lower legs.  Elevate the lower legs for at least 30 minutes in mid afternoon to help prevent excessive urination overnight.  Avoid fluids that will irritate your bladder like diet drinks, tea, and soda.  Minimize fluids 3 to 4 hours before bed, and urinate right before going to sleep.    Urgent PC office-based treatment for overactive bladder Take back control of your life! Urgent PC is a non-drug, non-surgical option for overactive bladder and associated symptoms of urinary urgency, urinary frequency and urge incontinence  Urgent-PC- Since 2003, healthcare professionals have used the Urgent PC Neuromodulation System as an effective office treatment for men and women suffering from overactive bladder, a condition commonly referred to as OAB. Urgent PC is up to 80% effective, even after conservative measures and OAB drugs have failed. Plus, Urgent PC is very low risk, making it a great choice for people unable or unwilling to have more invasive procedures.  How does Urgent PC work?  The Urgent PC system delivers a specific type of neuromodulation called percutaneous tibial nerve stimulation (PTNS). During treatment, a small, slim needle electrode is inserted near your ankle. The needle electrode is then connected to the battery-powered stimulator. During your 30-minute treatment, mild impulses from the stimulator travel through the needle electrode, along your leg and to the nerves in your pelvis that control bladder function. This process is also referred to as neuromodulation.  What will I feel with Urgent PC therapy?  Because patients may experience the sensation of the Urgent PC therapy in different ways, it's difficult to say what the treatment would feel like to you. Patients often describe the sensation as "tingling" or "pulsating." Treatment is typically well-tolerated by patients. Urgent PC offers many different levels of stimulation, so  your clinician will be able to adjust treatment to suit you as well as address any discomfort that you might experience during treatment.  How often will I need Urgent PC treatments? You will receive an initial series of 12 treatments scheduled about a week apart. If you respond, you will likely need a treatment about once per month to maintain your improvements.  How soon will I see results with Urgent PC? Because Urgent PC gently modifies the signals to achieve bladder control, it usually takes 5-7 weeks for symptoms to change. However, patients respond at different rates. In a review of about 100 patients who had success with Urgent PC, symptoms improved anywhere between 2-12 weeks. For about 20% of these patients, the symptoms of urgency and/or urge incontinence didn't improve until after 8 weeks.1  There is no way to anticipate who will respond earlier, later or not at all. That's why it is important to receive the 12 recommended treatments before you and your physician evaluate whether this therapy is an appropriate and effective choice for you.  How can I receive treatment with Urgent PC? Urgent PC is an option for patients with OAB. If you think you have OAB, talk to your doctor, a urologist or urogynecologist. If you have OAB, the doctor will work with you to determine your own personal treatment plan which usually starts with behavior and diet modifications plus medications. Urgent PC is an excellent option if these options don't work or provide sufficient improvements. Treatment with Urgent PC is typically performed at the office of a urologist, urogynecologist or gynecologist. So, if you run out of options with your normal doctor, consider visiting one of these specialists.  Does insurance  cover Urgent PC? Urgent PC treatment is reimbursed by Medicare across the [place]. Private insurance coverage varies by state. To see if your insurance company covers Urgent PC, use our Coverage  Finder or talk to your Healthcare Provider.  Are there patients who should not be treated with Urgent PC? Yes, these include: patients with pacemakers or implantable defibrillators, patients prone to excessive bleeding, patients with nerve damage that could impact either percutaneous tibial nerve or pelvic floor function and patients who are pregnant or planning to become pregnant during the duration of the treatment.  What are the risks associated with Urgent PC? The risks associated with Urgent PC therapy are low. Most common side-effects are temporary and include mild pain or skin inflammation at or near the stimulation site.

## 2020-10-28 LAB — URINALYSIS, COMPLETE
Bilirubin, UA: NEGATIVE
Glucose, UA: NEGATIVE
Ketones, UA: NEGATIVE
Leukocytes,UA: NEGATIVE
Nitrite, UA: NEGATIVE
Protein,UA: NEGATIVE
RBC, UA: NEGATIVE
Specific Gravity, UA: 1.02 (ref 1.005–1.030)
Urobilinogen, Ur: 0.2 mg/dL (ref 0.2–1.0)
pH, UA: 7 (ref 5.0–7.5)

## 2020-10-28 LAB — MICROSCOPIC EXAMINATION

## 2020-10-28 LAB — PSA, TOTAL AND FREE
PSA, Free Pct: >10 %
PSA, Free: 0.01 ng/mL
Prostate Specific Ag, Serum: <0.1 ng/mL

## 2020-11-18 ENCOUNTER — Ambulatory Visit: Payer: Medicare Other | Admitting: Urology

## 2020-11-28 NOTE — Progress Notes (Signed)
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  INTERVAL HISTORY: Patient returns to clinic today for further evaluation and continuation of Xgeva, Eligard, and Zytiga.  He continues to have chronic urinary frequency and nocturia, but otherwise feels well.  He admits to occasional hot flashes, but otherwise is tolerating his treatments well.  He continues to have chronic weakness and fatigue. He has no neurologic complaints.  He denies any recent fevers or illnesses.  He has a good appetite and denies weight loss.  He has no chest pain, shortness of breath, cough, or hemoptysis.  He denies any nausea, vomiting, constipation, or diarrhea.  Patient offers no further specific complaints today.  REVIEW OF SYSTEMS:   Review of Systems  Constitutional: Positive for malaise/fatigue. Negative for fever and weight loss.  Respiratory: Negative.  Negative for cough and hemoptysis.   Cardiovascular: Negative.  Negative for chest pain and leg swelling.  Gastrointestinal: Negative.  Negative for abdominal pain.  Genitourinary: Positive for frequency. Negative for hematuria.  Musculoskeletal: Negative.  Negative for back pain.  Skin: Negative.  Negative for rash.  Neurological: Positive for sensory change and weakness. Negative for dizziness, focal weakness and headaches.  Psychiatric/Behavioral: Negative.  The patient is not nervous/anxious.     As per HPI. Otherwise, a complete review of systems is negative.  PAST MEDICAL HISTORY: Past Medical History:  Diagnosis Date  . Chronic constipation   . Elevated prostate specific antigen (PSA)   . Family history of leukemia   . Family  history of lung cancer   . History of kidney stones   . Hyperlipidemia   . Hypertension   . Hypothyroid   . Hypothyroidism   . Paroxysmal A-fib ([place])   . Sinus bradycardia   . Sleep apnea    uses CPAP  . Squamous cell carcinoma of skin 2014   Right hand. Mohs.  . Stroke ([place])    throat / slight difficulty swallowing    PAST SURGICAL HISTORY: Past Surgical History:  Procedure Laterality Date  . BACK SURGERY  1997   L3, 4, 5 laminectomy   . BACK SURGERY  1998   hard wire removed, fusion of L3-5  . BACK SURGERY  2009   laminotomy/foraminotomy w/ decompression of nerve root, facet thermal ablation L2-3.   Marland Kitchen HOLEP-LASER ENUCLEATION OF THE PROSTATE WITH MORCELLATION N/A 06/18/2020   Procedure: HOLEP-LASER ENUCLEATION OF THE PROSTATE WITH MORCELLATION;  Surgeon: Billey Co, MD;  Location: [facility] ORS;  Service: Urology;  Laterality: N/A;  . KIDNEY STONE SURGERY  2010  . KNEE SURGERY Left 2007   Arthroscopic partial knee replacement, replaced medial side   . MOHS SURGERY Right 2014   squamous cell carcinoma  . NECK SURGERY  2008   fusion with hardwire in place C3 through C7   . pituitary adenoma  1994   benign  . PROSTATE BIOPSY N/A 01/01/2020   Procedure: PROSTATE BIOPSY URO NAV FUSION;  Surgeon: Royston Cowper, MD;  Location: [facility] ORS;  Service: Urology;  Laterality: N/A;  . SPINE SURGERY    . SPINE SURGERY  2016   fusion L4     FAMILY HISTORY: Family History  Problem Relation Age of Onset  . Stroke Mother   .  Heart attack Father   . Leukemia Sister 32  . Lung cancer Brother 11    ADVANCED DIRECTIVES (Y/N):  N  HEALTH MAINTENANCE: Social History   Tobacco Use  . Smoking status: Former Research scientist (life sciences)  . Smokeless tobacco: Never Used  . Tobacco comment: quit 50 years ago  Vaping Use  . Vaping Use: Never used  Substance Use Topics  . Alcohol use: Yes    Comment: rarely  . Drug use: Never     Colonoscopy:  PAP:  Bone density:  Lipid panel:  Allergies   Allergen Reactions  . Metoprolol Other (See Comments)    dizziness   . Rivaroxaban Other (See Comments)    hematuria     Current Outpatient Medications  Medication Sig Dispense Refill  . abiraterone acetate (ZYTIGA) 250 MG tablet Take 4 tablets (1,000 mg total) by mouth daily. Take on an empty stomach 1 hour before or 2 hours after a meal 120 tablet 2  . B Complex-C (SUPER B COMPLEX PO) Take 1 capsule by mouth daily.     . Calcium-Magnesium-Vitamin D (CALCIUM 1200+D3 PO) Take 2 tablets by mouth daily.    . cetirizine (ZYRTEC) 10 MG tablet Take 10 mg by mouth at bedtime.    Marland Kitchen denosumab (XGEVA) 120 MG/1.7ML SOLN injection Inject 120 mg into the skin every 28 (twenty-eight) days.     Marland Kitchen ELIQUIS 5 MG TABS tablet Take 5 mg by mouth 2 (two) times daily.    . hydrochlorothiazide (HYDRODIURIL) 25 MG tablet Take by mouth.    . hydrocortisone (CORTEF) 10 MG tablet Take 10 mg by mouth 2 (two) times daily.     Marland Kitchen Leuprolide Acetate (ELIGARD [place]) Inject into the skin.    Marland Kitchen levothyroxine (SYNTHROID) 50 MCG tablet Take 50 mcg by mouth daily before breakfast.    . Misc Natural Products (OSTEO BI-FLEX ADV JOINT SHIELD PO) Take 1 tablet by mouth daily.     . Multiple Vitamin (MULTIVITAMIN WITH MINERALS) TABS tablet Take 1 tablet by mouth daily. One-A-Day Men's 23+    . NON FORMULARY Take 1 capsule by mouth at bedtime. Pure Encapsulations MotilPro    . polyethylene glycol (MIRALAX / GLYCOLAX) 17 g packet Take 17 g by mouth daily. W/coffee    . Probiotic Product (ALIGN) 4 MG CAPS Take 4 mg by mouth daily.     . rosuvastatin (CRESTOR) 20 MG tablet Take 20 mg by mouth daily.    . Trospium Chloride 60 MG CP24 Take 1 capsule (60 mg total) by mouth daily. 30 capsule 3  . valsartan (DIOVAN) 320 MG tablet Take 320 mg by mouth daily.     No current facility-administered medications for this visit.    OBJECTIVE: Vitals:   11/30/20 1024  BP: (!) 152/88  Pulse: 65  Resp: 20  Temp: 97.9 F (36.6 C)  SpO2:  99%     Body mass index is 34.87 kg/m.    ECOG FS:0 - Asymptomatic  General: Well-developed, well-nourished, no acute distress. Eyes: Pink conjunctiva, anicteric sclera. HEENT: Normocephalic, moist mucous membranes. Lungs: No audible wheezing or coughing. Heart: Regular rate and rhythm. Abdomen: Soft, nontender, no obvious distention. Musculoskeletal: No edema, cyanosis, or clubbing. Neuro: Alert, answering all questions appropriately. Cranial nerves grossly intact. Skin: No rashes or petechiae noted. Psych: Normal affect.  LAB RESULTS:  Lab Results  Component Value Date   NA 135 11/30/2020   K 3.9 11/30/2020   CL 103 11/30/2020   CO2 22 11/30/2020  GLUCOSE 348 (H) 11/30/2020   BUN 43 (H) 11/30/2020   CREATININE 1.39 (H) 11/30/2020   CALCIUM 9.3 11/30/2020   PROT 6.3 (L) 11/30/2020   ALBUMIN 3.8 11/30/2020   AST 38 11/30/2020   ALT 32 11/30/2020   ALKPHOS 39 11/30/2020   BILITOT 0.8 11/30/2020   GFRNONAA 50 (L) 11/30/2020   GFRAA >60 09/01/2020    Lab Results  Component Value Date   WBC 12.6 (H) 11/30/2020   NEUTROABS 10.6 (H) 11/30/2020   HGB 10.8 (L) 11/30/2020   HCT 32.8 (L) 11/30/2020   MCV 84.5 11/30/2020   PLT 161 11/30/2020     STUDIES: No results found.  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  PLAN:    1. 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  Patient's PSA was initially greater than 8.0, but trended down to less than 0.1.  Today's result was 0.08.  Continue Zytiga as directed.  Proceed with Delton See and Eligard today.  Return to clinic in 4 weeks for further evaluation and continuation of treatment. 2.  Bony metastasis: Xgeva as above.  Patient's most recent calcium levels are adequate to proceed as scheduled.   3.  Urinary frequency/hematuria: Continue follow-up with urology as scheduled. 4.  Hot flashes: Chronic and  unchanged.  Likely second Eligard, monitor. 5.  Anemia: Chronic and unchanged.  He does not require IV Feraheme at this time.  Patient expressed understanding and was in agreement with this plan. He also understands that He can call clinic at any time with any questions, concerns, or complaints.   Cancer Staging Prostate cancer [facility]) Staging form: Prostate, AJCC 8th Edition - Clinical stage from 02/17/2020: Stage IVB (cT2c, cN0, cM1b, PSA: 8.6, Grade Group: 5) - Signed by Lloyd Huger, MD on 02/17/2020   Lloyd Huger, MD   12/02/2020 8:37 AM

## 2020-11-30 ENCOUNTER — Inpatient Hospital Stay: Payer: Medicare Other

## 2020-11-30 ENCOUNTER — Encounter: Payer: Self-pay | Admitting: Emergency Medicine

## 2020-11-30 ENCOUNTER — Telehealth: Payer: Self-pay | Admitting: *Deleted

## 2020-11-30 ENCOUNTER — Emergency Department
Admission: EM | Admit: 2020-11-30 | Discharge: 2020-11-30 | Disposition: A | Discharge status: Home / Self Care | Payer: Medicare Other | Source: Home / Self Care | Attending: Emergency Medicine | Admitting: Emergency Medicine

## 2020-11-30 ENCOUNTER — Encounter: Payer: Self-pay | Admitting: Oncology

## 2020-11-30 ENCOUNTER — Telehealth: Payer: Medicare Other | Admitting: Urology

## 2020-11-30 ENCOUNTER — Other Ambulatory Visit: Payer: Self-pay

## 2020-11-30 ENCOUNTER — Inpatient Hospital Stay (HOSPITAL_BASED_OUTPATIENT_CLINIC_OR_DEPARTMENT_OTHER): Payer: Medicare Other | Admitting: Oncology

## 2020-11-30 ENCOUNTER — Inpatient Hospital Stay: Payer: Medicare Other | Attending: Oncology

## 2020-11-30 VITALS — BP 152/88 | HR 65 | Temp 97.9°F | Resp 20 | Wt 243.0 lb

## 2020-11-30 DIAGNOSIS — R58 Hemorrhage, not elsewhere classified: Secondary | ICD-10-CM | POA: Insufficient documentation

## 2020-11-30 DIAGNOSIS — I4891 Unspecified atrial fibrillation: Secondary | ICD-10-CM

## 2020-11-30 DIAGNOSIS — T45515A Adverse effect of anticoagulants, initial encounter: Secondary | ICD-10-CM | POA: Insufficient documentation

## 2020-11-30 DIAGNOSIS — Z5111 Encounter for antineoplastic chemotherapy: Secondary | ICD-10-CM | POA: Insufficient documentation

## 2020-11-30 DIAGNOSIS — Z79899 Other long term (current) drug therapy: Secondary | ICD-10-CM | POA: Insufficient documentation

## 2020-11-30 DIAGNOSIS — C61 Malignant neoplasm of prostate: Secondary | ICD-10-CM | POA: Insufficient documentation

## 2020-11-30 DIAGNOSIS — E039 Hypothyroidism, unspecified: Secondary | ICD-10-CM | POA: Insufficient documentation

## 2020-11-30 DIAGNOSIS — C7951 Secondary malignant neoplasm of bone: Secondary | ICD-10-CM | POA: Insufficient documentation

## 2020-11-30 DIAGNOSIS — E119 Type 2 diabetes mellitus without complications: Secondary | ICD-10-CM | POA: Insufficient documentation

## 2020-11-30 DIAGNOSIS — Z7901 Long term (current) use of anticoagulants: Secondary | ICD-10-CM | POA: Insufficient documentation

## 2020-11-30 DIAGNOSIS — I10 Essential (primary) hypertension: Secondary | ICD-10-CM | POA: Insufficient documentation

## 2020-11-30 DIAGNOSIS — Z87891 Personal history of nicotine dependence: Secondary | ICD-10-CM | POA: Insufficient documentation

## 2020-11-30 LAB — CBC WITH DIFFERENTIAL/PLATELET
Abs Immature Granulocytes: 0.14 10*3/uL — ABNORMAL HIGH (ref 0.00–0.07)
Basophils Absolute: 0 10*3/uL (ref 0.0–0.1)
Basophils Relative: 0 %
Eosinophils Absolute: 0 10*3/uL (ref 0.0–0.5)
Eosinophils Relative: 0 %
HCT: 32.8 % — ABNORMAL LOW (ref 39.0–52.0)
Hemoglobin: 10.8 g/dL — ABNORMAL LOW (ref 13.0–17.0)
Immature Granulocytes: 1 %
Lymphocytes Relative: 8 %
Lymphs Abs: 1 10*3/uL (ref 0.7–4.0)
MCH: 27.8 pg (ref 26.0–34.0)
MCHC: 32.9 g/dL (ref 30.0–36.0)
MCV: 84.5 fL (ref 80.0–100.0)
Monocytes Absolute: 0.8 10*3/uL (ref 0.1–1.0)
Monocytes Relative: 7 %
Neutro Abs: 10.6 10*3/uL — ABNORMAL HIGH (ref 1.7–7.7)
Neutrophils Relative %: 84 %
Platelets: 161 10*3/uL (ref 150–400)
RBC: 3.88 MIL/uL — ABNORMAL LOW (ref 4.22–5.81)
RDW: 22 % — ABNORMAL HIGH (ref 11.5–15.5)
WBC: 12.6 10*3/uL — ABNORMAL HIGH (ref 4.0–10.5)
nRBC: 0 % (ref 0.0–0.2)

## 2020-11-30 LAB — COMPREHENSIVE METABOLIC PANEL
ALT: 32 U/L (ref 0–44)
AST: 38 U/L (ref 15–41)
Albumin: 3.8 g/dL (ref 3.5–5.0)
Alkaline Phosphatase: 39 U/L (ref 38–126)
Anion gap: 10 (ref 5–15)
BUN: 43 mg/dL — ABNORMAL HIGH (ref 8–23)
CO2: 22 mmol/L (ref 22–32)
Calcium: 9.3 mg/dL (ref 8.9–10.3)
Chloride: 103 mmol/L (ref 98–111)
Creatinine, Ser: 1.39 mg/dL — ABNORMAL HIGH (ref 0.61–1.24)
GFR, Estimated: 50 mL/min — ABNORMAL LOW (ref >60)
Glucose, Bld: 348 mg/dL — ABNORMAL HIGH (ref 70–99)
Potassium: 3.9 mmol/L (ref 3.5–5.1)
Sodium: 135 mmol/L (ref 135–145)
Total Bilirubin: 0.8 mg/dL (ref 0.3–1.2)
Total Protein: 6.3 g/dL — ABNORMAL LOW (ref 6.5–8.1)

## 2020-11-30 LAB — PSA: Prostatic Specific Antigen: 0.08 ng/mL (ref 0.00–4.00)

## 2020-11-30 MED ORDER — LEUPROLIDE ACETATE (6 MONTH) 45 MG ~~LOC~~ KIT
45.0000 mg | PACK | Freq: Once | SUBCUTANEOUS | Status: AC
  Administered 2020-11-30: 45 mg via SUBCUTANEOUS
  Filled 2020-11-30: qty 45

## 2020-11-30 MED ORDER — DENOSUMAB 120 MG/1.7ML ~~LOC~~ SOLN
120.0000 mg | Freq: Once | SUBCUTANEOUS | Status: AC
  Administered 2020-11-30: 120 mg via SUBCUTANEOUS
  Filled 2020-11-30: qty 1.7

## 2020-11-30 NOTE — Discharge Instructions (Addendum)
Skip tonight's dose of Eliquis and resume tomorrow at your usual dose.

## 2020-11-30 NOTE — ED Provider Notes (Signed)
[facility] Emergency Department Provider Note  ____________________________________________  Time seen: Approximately 4:52 PM  I have reviewed the triage vital signs and the nursing notes.   HISTORY  Chief Complaint Wound Check    HPI Andrew Callahan is a 86 y.o. male with a history of hypertension paroxysmal atrial fibrillation who comes ED complaining of bleeding from his abdominal wall injection site where he received medication at 10:30 AM this morning at cancer center.  He has remained bleeding with a slow ooze throughout the day, soaking his shirt and pants.  Denies any other symptoms such as fevers chills dizziness palpitations or syncope.  No chest pain or shortness of breath.  He has been compliant with his Eliquis dosing, not taking any extra.  Denies black or bloody stool.      Past Medical History:  Diagnosis Date  . Chronic constipation   . Elevated prostate specific antigen (PSA)   . Family history of leukemia   . Family history of lung cancer   . History of kidney stones   . Hyperlipidemia   . Hypertension   . Hypothyroid   . Hypothyroidism   . Paroxysmal A-fib ([place])   . Sinus bradycardia   . Sleep apnea    uses CPAP  . Squamous cell carcinoma of skin 2014   Right hand. Mohs.  . Stroke ([place])    throat / slight difficulty swallowing     Patient Active Problem List   Diagnosis Date Noted  . Lower urinary tract symptoms (LUTS) 05/19/2020  . Metastatic adenocarcinoma to bone ([place]) 05/03/2020  . Stage IV adenocarcinoma of prostate ([place]) 05/03/2020  . Genetic testing 04/07/2020  . Family history of lung cancer   . Family history of leukemia   . Numbness and tingling of foot 03/22/2020  . Goals of care, counseling/discussion 02/17/2020  . Prostate cancer ([place]) 02/15/2020  . Tubular adenoma 11/04/2019  . Dysarthria 07/01/2019  . History of CVA (cerebrovascular accident) 07/01/2019  . Anticoagulant long-term use 06/22/2019  . Left  sided lacunar infarction ([place]) 06/18/2019  . Hyperlipidemia 05/31/2019  . Coronary artery calcification 04/12/2019  . Benign essential hypertension 01/15/2019  . History of pituitary adenoma 01/15/2019  . 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 Hematuria 10/11/2018  . Sinus bradycardia 10/11/2018  . Thyroid disease 10/11/2018  . Chronic constipation 10/04/2018  . Diverticulosis large intestine w/o perforation or abscess w/o bleeding 10/04/2018  . Polyp of descending colon 10/04/2018  . OSA (obstructive sleep apnea) 05/15/2018  . PAF (paroxysmal atrial fibrillation) ([place]) 01/16/2018  . Type 2 diabetes mellitus without complication, without long-term current use of insulin ([place]) 11/08/2017  . Adrenal insufficiency ([place]) 10/05/2017  . Snores 05/09/2017  . Health care maintenance 11/06/2016  . Mononeuropathy 10/16/2016  . Obesity (BMI 35.0-39.9 without comorbidity) 04/03/2016  . Tinnitus of both ears 04/03/2016  . Spinal stenosis of lumbar region 01/28/2015  . Benign prostatic hyperplasia without urinary obstruction 04/07/2013  . Lumbar degenerative disc disease 12/23/2012  . Acquired hypothyroidism 11/24/2010  . Calculus, kidney 11/24/2010  . Raised prostate specific antigen 11/24/2010  . Hearing loss 11/24/2010  . Hyperlipidemia associated with type 2 diabetes mellitus ([place]) 11/24/2010  . Testicular hypofunction 11/24/2010  . Osteoarthritis of multiple joints 11/24/2010  . Rhinitis, allergic 11/24/2010  . Skin tag 11/24/2010     Past Surgical History:  Procedure Laterality Date  . BACK SURGERY  1997   L3, 4, 5 laminectomy   . [place]  hard wire removed, fusion of L3-5  . BACK SURGERY  2009   laminotomy/foraminotomy w/ decompression of nerve root, facet thermal ablation L2-3.   Marland Kitchen HOLEP-LASER ENUCLEATION OF THE PROSTATE WITH MORCELLATION N/A 06/18/2020   Procedure: HOLEP-LASER ENUCLEATION OF THE PROSTATE WITH  MORCELLATION;  Surgeon: Billey Co, MD;  Location: [facility] ORS;  Service: Urology;  Laterality: N/A;  . KIDNEY STONE SURGERY  2010  . KNEE SURGERY Left 2007   Arthroscopic partial knee replacement, replaced medial side   . MOHS SURGERY Right 2014   squamous cell carcinoma  . NECK SURGERY  2008   fusion with hardwire in place C3 through C7   . pituitary adenoma  1994   benign  . PROSTATE BIOPSY N/A 01/01/2020   Procedure: PROSTATE BIOPSY URO NAV FUSION;  Surgeon: Royston Cowper, MD;  Location: [facility] ORS;  Service: Urology;  Laterality: N/A;  . SPINE SURGERY    . SPINE SURGERY  2016   fusion L4      Prior to Admission medications   Medication Sig Start Date End Date Taking? Authorizing Provider  abiraterone acetate (ZYTIGA) 250 MG tablet Take 4 tablets (1,000 mg total) by mouth daily. Take on an empty stomach 1 hour before or 2 hours after a meal 06/28/20   Finnegan, Kathlene November, MD  B Complex-C (SUPER B COMPLEX PO) Take 1 capsule by mouth daily.     [provider]  Calcium-Magnesium-Vitamin D (CALCIUM 1200+D3 PO) Take 2 tablets by mouth daily.    [provider]  cetirizine (ZYRTEC) 10 MG tablet Take 10 mg by mouth at bedtime.    [provider]  denosumab (XGEVA) 120 MG/1.7ML SOLN injection Inject 120 mg into the skin every 28 (twenty-eight) days.     [provider]  ELIQUIS 5 MG TABS tablet Take 5 mg by mouth 2 (two) times daily. 11/04/19   [provider]  hydrochlorothiazide (HYDRODIURIL) 25 MG tablet Take by mouth. 10/11/20 10/29/21  [provider]  hydrocortisone (CORTEF) 10 MG tablet Take 10 mg by mouth 2 (two) times daily.  10/13/19   [provider]  Leuprolide Acetate (ELIGARD [place]) Inject into the skin.    [provider]  levothyroxine (SYNTHROID) 50 MCG tablet Take 50 mcg by mouth daily before breakfast. 11/26/19   [provider]  Misc Natural Products (OSTEO BI-FLEX ADV JOINT SHIELD PO) Take 1  tablet by mouth daily.     [provider]  Multiple Vitamin (MULTIVITAMIN WITH MINERALS) TABS tablet Take 1 tablet by mouth daily. One-A-Day Men's 50+    [provider]  NON FORMULARY Take 1 capsule by mouth at bedtime. Pure Encapsulations MotilPro    [provider]  polyethylene glycol (MIRALAX / GLYCOLAX) 17 g packet Take 17 g by mouth daily. W/coffee    [provider]  Probiotic Product (ALIGN) 4 MG CAPS Take 4 mg by mouth daily.     [provider]  rosuvastatin (CRESTOR) 20 MG tablet Take 20 mg by mouth daily. 07/17/19   [provider]  Trospium Chloride 60 MG CP24 Take 1 capsule (60 mg total) by mouth daily. 10/27/20   Billey Co, MD  valsartan (DIOVAN) 320 MG tablet Take 320 mg by mouth daily. 11/18/19   [provider]     Allergies Metoprolol and Rivaroxaban   Family History  Problem Relation Age of Onset  . Stroke Mother   . Heart attack Father   . Leukemia Sister 77  .  Lung cancer Brother 21    Social History Social History   Tobacco Use  . Smoking status: Former Research scientist (life sciences)  . Smokeless tobacco: Never Used  . Tobacco comment: quit 50 years ago  Vaping Use  . Vaping Use: Never used  Substance Use Topics  . Alcohol use: Yes    Comment: rarely  . Drug use: Never    Review of Systems  Constitutional:   No fever or chills.  ENT:   No sore throat. No rhinorrhea. Cardiovascular:   No chest pain or syncope. Respiratory:   No dyspnea or cough. Gastrointestinal:   Negative for abdominal pain, vomiting and diarrhea.  Musculoskeletal:   Negative for focal pain or swelling All other systems reviewed and are negative except as documented above in ROS and HPI.  ____________________________________________   PHYSICAL EXAM:  VITAL SIGNS: ED Triage Vitals  Enc Vitals Group     BP 11/30/20 1525 (!) 140/93     Pulse Rate 11/30/20 1525 63     Resp 11/30/20 1525 20     Temp 11/30/20 1525 98.6 F (37 C)      Temp Source 11/30/20 1525 Oral     SpO2 11/30/20 1525 100 %     Weight 11/30/20 1523 240 lb (108.9 kg)     Height 11/30/20 1523 5\' 10"  (1.778 m)     Head Circumference --      Peak Flow --      Pain Score 11/30/20 1523 0     Pain Loc --      Pain Edu? --      Excl. in [place]? --     Vital signs reviewed, nursing assessments reviewed.   Constitutional:   Alert and oriented. Non-toxic appearance. Eyes:   Conjunctivae are normal. EOMI. ENT      Head:   Normocephalic and atraumatic.      Mouth/Throat:   MMM      Neck:   No meningismus. Full ROM.  Cardiovascular:   RRR.  Cap refill less than 2 seconds. Respiratory:   Normal respiratory effort without tachypnea/retractions. Gastrointestinal:   Soft and nontender. Non distended.   No rebound, rigidity, or guarding. Musculoskeletal:   Normal range of motion in all extremities.  No edema. Neurologic:   Normal speech and language.  Motor grossly intact. No acute focal neurologic deficits are appreciated.  Skin:    Skin is warm, dry and intact.  There is a small pinpoint site of bleeding on the abdominal wall about 3 cm left of the midline near the level of the umbilicus.  Nonpulsatile.  There is no palpable fluid collection or mass under the skin and the tissues are soft.  No ecchymosis.  ____________________________________________    LABS (pertinent positives/negatives) (all labs ordered are listed, but only abnormal results are displayed) Labs Reviewed - No data to display ____________________________________________   EKG  ____________________________________________    RADIOLOGY  No results found.  ____________________________________________   PROCEDURES Procedures  ____________________________________________  CLINICAL IMPRESSION / ASSESSMENT AND PLAN / ED COURSE  Pertinent labs & imaging results that were available during my care of the patient were reviewed by me and considered in my medical decision making  (see chart for details).  Andrew Callahan was evaluated in Emergency Department on 11/30/2020 for the symptoms described in the history of present illness. He was evaluated in the context of the global COVID-19 pandemic, which necessitated consideration that the patient might be at risk for infection with the SARS-CoV-2 virus  that causes COVID-19. Institutional protocols and algorithms that pertain to the evaluation of patients at risk for COVID-19 are in a state of rapid change based on information released by regulatory bodies including the CDC and federal and state organizations. These policies and algorithms were followed during the patient's care in the ED.   Patient presents with bleeding from a skin puncture site where he had a subcutaneous injection of medication earlier today.  This is attributable to Eliquis.  Doubt arterial injury.  Surgicel was placed on this with direct pressure held for about 30 minutes, resolving the bleeding.  Will provide the patient instructions and supplies for home care if this should recur, will have him skip tonight's dose of Eliquis since it is for stroke prevention in A. fib, resume tomorrow.  Stable for discharge home.      ____________________________________________   FINAL CLINICAL IMPRESSION(S) / ED DIAGNOSES    Final diagnoses:  Bleeding  Anticoagulant causing adverse effect in therapeutic use, initial encounter  Atrial fibrillation, unspecified type [facility])     ED Discharge Orders    None      Portions of this note were generated with dragon dictation software. Dictation errors may occur despite best attempts at proofreading.   Andrew Mew, MD 11/30/20 [phone]

## 2020-11-30 NOTE — Telephone Encounter (Signed)
Wife called reporting that his Injection site is "pouring blood" nd they have been holding pressure on it and it is still bleeding. Reports that he is on Eliquis. Asking what to do. I discussed with Lorretta Harp NP and advised both patient and his wife to hold pressure and use ice pack on area and if it continues to bleed to go to ER

## 2020-11-30 NOTE — ED Triage Notes (Addendum)
Pt via POV from home. Pt got a subQ shot of Eligard on his LLQ of his abd from the [place] around 1030 this morning. Pt on Eliquis. Pt noticed bleeding around lunch time around 1130. Pt is A&OX4 and NAD. Denies pain.

## 2020-11-30 NOTE — ED Notes (Signed)
Surgiseal placed on site at this time.

## 2020-12-01 ENCOUNTER — Encounter: Payer: Self-pay | Admitting: Oncology

## 2020-12-06 ENCOUNTER — Encounter: Payer: Self-pay | Admitting: Oncology

## 2020-12-06 ENCOUNTER — Other Ambulatory Visit: Payer: Self-pay | Admitting: *Deleted

## 2020-12-06 ENCOUNTER — Other Ambulatory Visit: Payer: Self-pay | Admitting: Pharmacist

## 2020-12-06 DIAGNOSIS — C61 Malignant neoplasm of prostate: Secondary | ICD-10-CM

## 2020-12-06 MED ORDER — ABIRATERONE ACETATE 250 MG PO TABS: 1000.0000 mg | ORAL_TABLET | Freq: Every day | ORAL | 2 refills | Status: DC

## 2020-12-06 MED ORDER — VENLAFAXINE HCL 25 MG PO TABS: 25.0000 mg | ORAL_TABLET | Freq: Every day | ORAL | 1 refills | Status: DC

## 2020-12-06 NOTE — Progress Notes (Signed)
NA

## 2020-12-06 NOTE — Progress Notes (Signed)
e

## 2020-12-06 NOTE — Progress Notes (Signed)
Oral Chemotherapy Pharmacist Encounter   Received a call from patient's wife letting us know that their insurance is requiring they now fill his Zytiga at [facility] in the new year. Refill sent to [facility].   Darl Pikes, PharmD, BCPS, BCOP, CPP Hematology/Oncology Clinical Pharmacist [facility] [phone]  12/06/2020 12:02 PM

## 2020-12-07 ENCOUNTER — Telehealth (INDEPENDENT_AMBULATORY_CARE_PROVIDER_SITE_OTHER): Payer: Medicare Other | Admitting: Urology

## 2020-12-07 ENCOUNTER — Other Ambulatory Visit: Payer: Self-pay

## 2020-12-07 DIAGNOSIS — C61 Malignant neoplasm of prostate: Secondary | ICD-10-CM | POA: Diagnosis not present

## 2020-12-07 DIAGNOSIS — N138 Other obstructive and reflux uropathy: Secondary | ICD-10-CM | POA: Diagnosis not present

## 2020-12-07 DIAGNOSIS — N3281 Overactive bladder: Secondary | ICD-10-CM | POA: Diagnosis not present

## 2020-12-07 DIAGNOSIS — N401 Enlarged prostate with lower urinary tract symptoms: Secondary | ICD-10-CM | POA: Diagnosis not present

## 2020-12-07 NOTE — Progress Notes (Signed)
Virtual Visit via Telephone Note  I connected with Andrew Callahan on 12/07/20 at 11:30 AM EST by telephone and verified that I am speaking with the correct person using two identifiers.   Patient location: Home Provider location: [facility] Office   I discussed the limitations, risks, security and privacy concerns of performing an evaluation and management service by telephone and the availability of in person appointments. We discussed the impact of the COVID-19 pandemic on the healthcare system, and the importance of social distancing and reducing patient and provider exposure. I also discussed with the patient that there may be a patient responsible charge related to this service. The patient expressed understanding and agreed to proceed.  Reason for visit: Follow-up BPH/OAB, prostate cancer  History of Present Illness: 87 year old male with history of stroke and metastatic prostate cancer, as well as urinary retention and elevated PVRs.  He underwent an uncomplicated HOLEP on 06/18/2020.  He has been voiding with a very strong stream since surgery and low PVRs, but continues to have persistent bothersome overactive symptoms including urgency, frequency every 2 hours during the day, and nocturia.  He is compliant with his sleep apnea machine.  At our last visit on 10/27/2020 we performed cystoscopy which showed a wide open prostatic fossa, with intact sphincter and normal-appearing bladder.  I suspect his persistent urinary symptoms of nocturia and urgency/frequency during the day are related to overactive bladder from longstanding BPH and his history of stroke.  He failed a trial of Myrbetriq previously.  At that visit we started trospium, and he does think this has made a significant improvement in his urinary urgency and frequency during the day, but he has persistently bothersome nocturia 4 times per night.  This also has also declined over the last few weeks and he has had some side  effects of hot flashes and vertigo.  He has follow-up upcoming with oncology and they recently started him on Effexor to see if this would help his symptoms.  We discussed possible need for brain imaging in the future to rule out any other abnormalities, and PSA is still very low at 0.08 from 0.04.  He did not want to try a condom catheter overnight for his nocturia.  I recommended a voiding diary for better evaluation of his volumes during the day and night, to help determine our next steps.  He may be a candidate for PTNS or desmopressin in the future pending voiding diary results.  If he is having worsening confusion or falls, we may need to discontinue the trospium, though typically this medication does not cross the blood-brain barrier and cause anticholinergic effects.   Follow Up: Continue trospium Follow-up 6 weeks with voiding diary   I discussed the assessment and treatment plan with the patient. The patient was provided an opportunity to ask questions and all were answered. The patient agreed with the plan and demonstrated an understanding of the instructions.   The patient was advised to call back or seek an in-person evaluation if the symptoms worsen or if the condition fails to improve as anticipated.  I provided 23 minutes of non-face-to-face time during this encounter.   Sondra Come, MD

## 2020-12-29 ENCOUNTER — Other Ambulatory Visit: Payer: Self-pay | Admitting: Oncology

## 2020-12-31 ENCOUNTER — Inpatient Hospital Stay: Payer: Medicare Other

## 2020-12-31 ENCOUNTER — Encounter: Payer: Self-pay | Admitting: Oncology

## 2020-12-31 ENCOUNTER — Inpatient Hospital Stay (HOSPITAL_BASED_OUTPATIENT_CLINIC_OR_DEPARTMENT_OTHER): Payer: Medicare Other | Admitting: Oncology

## 2020-12-31 ENCOUNTER — Inpatient Hospital Stay: Payer: Medicare Other | Attending: Oncology

## 2020-12-31 VITALS — BP 110/55 | HR 76 | Temp 98.9°F | Resp 18 | Wt 242.0 lb

## 2020-12-31 VITALS — BP 187/90 | HR 65 | Resp 20

## 2020-12-31 DIAGNOSIS — C7951 Secondary malignant neoplasm of bone: Secondary | ICD-10-CM | POA: Insufficient documentation

## 2020-12-31 DIAGNOSIS — Z79899 Other long term (current) drug therapy: Secondary | ICD-10-CM | POA: Diagnosis not present

## 2020-12-31 DIAGNOSIS — C61 Malignant neoplasm of prostate: Secondary | ICD-10-CM

## 2020-12-31 DIAGNOSIS — D509 Iron deficiency anemia, unspecified: Secondary | ICD-10-CM | POA: Diagnosis present

## 2020-12-31 LAB — CBC WITH DIFFERENTIAL/PLATELET
Abs Immature Granulocytes: 0.1 10*3/uL — ABNORMAL HIGH (ref 0.00–0.07)
Basophils Absolute: 0 10*3/uL (ref 0.0–0.1)
Basophils Relative: 0 %
Eosinophils Absolute: 0 10*3/uL (ref 0.0–0.5)
Eosinophils Relative: 0 %
HCT: 26.3 % — ABNORMAL LOW (ref 39.0–52.0)
Hemoglobin: 8.6 g/dL — ABNORMAL LOW (ref 13.0–17.0)
Immature Granulocytes: 1 %
Lymphocytes Relative: 12 %
Lymphs Abs: 0.9 10*3/uL (ref 0.7–4.0)
MCH: 29 pg (ref 26.0–34.0)
MCHC: 32.7 g/dL (ref 30.0–36.0)
MCV: 88.6 fL (ref 80.0–100.0)
Monocytes Absolute: 0.5 10*3/uL (ref 0.1–1.0)
Monocytes Relative: 6 %
Neutro Abs: 6.1 10*3/uL (ref 1.7–7.7)
Neutrophils Relative %: 81 %
Platelets: 203 10*3/uL (ref 150–400)
RBC: 2.97 MIL/uL — ABNORMAL LOW (ref 4.22–5.81)
RDW: 18.5 % — ABNORMAL HIGH (ref 11.5–15.5)
WBC: 7.6 10*3/uL (ref 4.0–10.5)
nRBC: 0 % (ref 0.0–0.2)

## 2020-12-31 LAB — COMPREHENSIVE METABOLIC PANEL
ALT: 28 U/L (ref 0–44)
AST: 36 U/L (ref 15–41)
Albumin: 3.2 g/dL — ABNORMAL LOW (ref 3.5–5.0)
Alkaline Phosphatase: 48 U/L (ref 38–126)
Anion gap: 8 (ref 5–15)
BUN: 25 mg/dL — ABNORMAL HIGH (ref 8–23)
CO2: 19 mmol/L — ABNORMAL LOW (ref 22–32)
Calcium: 8.6 mg/dL — ABNORMAL LOW (ref 8.9–10.3)
Chloride: 110 mmol/L (ref 98–111)
Creatinine, Ser: 1.19 mg/dL (ref 0.61–1.24)
GFR, Estimated: >60 mL/min (ref >60)
Glucose, Bld: 355 mg/dL — ABNORMAL HIGH (ref 70–99)
Potassium: 4.1 mmol/L (ref 3.5–5.1)
Sodium: 137 mmol/L (ref 135–145)
Total Bilirubin: 0.7 mg/dL (ref 0.3–1.2)
Total Protein: 5.7 g/dL — ABNORMAL LOW (ref 6.5–8.1)

## 2020-12-31 LAB — PSA: Prostatic Specific Antigen: 0.1 ng/mL (ref 0.00–4.00)

## 2020-12-31 MED ORDER — DENOSUMAB 120 MG/1.7ML ~~LOC~~ SOLN
120.0000 mg | Freq: Once | SUBCUTANEOUS | Status: AC
  Administered 2020-12-31: 120 mg via SUBCUTANEOUS
  Filled 2020-12-31: qty 1.7

## 2020-12-31 MED ORDER — SODIUM CHLORIDE 0.9 % IV SOLN
Freq: Once | INTRAVENOUS | Status: AC
Start: 2020-12-31 — End: 2020-12-31
  Filled 2020-12-31: qty 250

## 2020-12-31 MED ORDER — SODIUM CHLORIDE 0.9 % IV SOLN
510.0000 mg | Freq: Once | INTRAVENOUS | Status: AC
  Administered 2020-12-31: 510 mg via INTRAVENOUS
  Filled 2020-12-31: qty 17

## 2020-12-31 NOTE — Progress Notes (Signed)
Patient here for oncology follow-up appointment, expresses concerns of swollen ankles, dizziness, hot flashes and overactive bladder.

## 2020-12-31 NOTE — Progress Notes (Signed)
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  INTERVAL HISTORY: Patient returns to clinic today for further evaluation and continuation of Xgeva, Eligard, and Zytiga.  He endorses many complaints today.  Patient's wife states since his last visit here his performance status has declined dramatically.  He has fallen on 2 separate occasions.  He feels dizzy and is very weak.  He requires the use of a walker which he did not require over a month ago.  His balance is off.  He has hot flashes.  He was prescribed Effexor which he stopped secondary to side effects (dizziness).  He received Eligard last month and was apparently unable to get the bleeding to stop.  He had to report to the emergency room for pressure to be applied for several hours.  He reports swelling in his ankles.  His blood sugar has been elevated.  He is followed by his primary care provider Dr. Sabra Heck but has not been started on any medication.  Has noted a few dark stools.  He is taking oral iron tablets.  He denies any recent infections or illnesses.  His appetite is fair.  He is not drinking a ton of fluids secondary to frequent urination.  Because he has been so unsteady on his feet he has restricted his fluids so he does not have to pee as often.  He denies any chest pain or shortness of breath, cough or hemoptysis.  Him  He denies any nausea, vomiting, constipation, or diarrhea.  Patient offers no further specific complaints today.  REVIEW OF SYSTEMS:   Review of Systems  Constitutional: Positive for malaise/fatigue.  Cardiovascular: Positive for leg swelling.  Musculoskeletal: Positive for falls.   Neurological: Positive for dizziness and weakness.  Psychiatric/Behavioral: The patient has insomnia.     As per HPI. Otherwise, a complete review of systems is negative.  PAST MEDICAL HISTORY: Past Medical History:  Diagnosis Date  . Chronic constipation   . Elevated prostate specific antigen (PSA)   . Family history of leukemia   . Family history of lung cancer   . History of kidney stones   . Hyperlipidemia   . Hypertension   . Hypothyroid   . Hypothyroidism   . Paroxysmal A-fib ([place])   . Sinus bradycardia   . Sleep apnea    uses CPAP  . Squamous cell carcinoma of skin 2014   Right hand. Mohs.  . Stroke ([place])    throat / slight difficulty swallowing    PAST SURGICAL HISTORY: Past Surgical History:  Procedure Laterality Date  . BACK SURGERY  1997   L3, 4, 5 laminectomy   . BACK SURGERY  1998   hard wire removed, fusion of L3-5  . BACK SURGERY  2009   laminotomy/foraminotomy w/ decompression of nerve root, facet thermal ablation L2-3.   Marland Kitchen HOLEP-LASER ENUCLEATION OF THE PROSTATE WITH MORCELLATION N/A 06/18/2020   Procedure: HOLEP-LASER ENUCLEATION OF THE PROSTATE WITH MORCELLATION;  Surgeon: Billey Co, MD;  Location: [facility] ORS;  Service: Urology;  Laterality: N/A;  . KIDNEY STONE SURGERY  2010  . KNEE SURGERY Left 2007   Arthroscopic partial knee replacement, replaced medial side   . MOHS SURGERY Right 2014   squamous  cell carcinoma  . NECK SURGERY  2008   fusion with hardwire in place C3 through C7   . pituitary adenoma  1994   benign  . PROSTATE BIOPSY N/A 01/01/2020   Procedure: PROSTATE BIOPSY URO NAV FUSION;  Surgeon: Royston Cowper, MD;  Location: [facility] ORS;  Service: Urology;  Laterality: N/A;  . SPINE SURGERY    . SPINE SURGERY  2016   fusion L4     FAMILY HISTORY: Family History  Problem Relation Age of Onset  . Stroke Mother   . Heart attack Father   . Leukemia Sister 81  . Lung cancer Brother 62    ADVANCED DIRECTIVES (Y/N):   N  HEALTH MAINTENANCE: Social History   Tobacco Use  . Smoking status: Former Research scientist (life sciences)  . Smokeless tobacco: Never Used  . Tobacco comment: quit 50 years ago  Vaping Use  . Vaping Use: Never used  Substance Use Topics  . Alcohol use: Yes    Comment: rarely  . Drug use: Never     Colonoscopy:  PAP:  Bone density:  Lipid panel:  Allergies  Allergen Reactions  . Metoprolol Other (See Comments)    dizziness   . Rivaroxaban Other (See Comments)    hematuria     Current Outpatient Medications  Medication Sig Dispense Refill  . abiraterone acetate (ZYTIGA) 250 MG tablet Take 4 tablets (1,000 mg total) by mouth daily. Take on an empty stomach 1 hour before or 2 hours after a meal 120 tablet 2  . B Complex-C (SUPER B COMPLEX PO) Take 1 capsule by mouth daily.     . Calcium-Magnesium-Vitamin D (CALCIUM 1200+D3 PO) Take 2 tablets by mouth daily.    . cetirizine (ZYRTEC) 10 MG tablet Take 10 mg by mouth at bedtime.    Marland Kitchen denosumab (XGEVA) 120 MG/1.7ML SOLN injection Inject 120 mg into the skin every 28 (twenty-eight) days.     Marland Kitchen ELIQUIS 5 MG TABS tablet Take 5 mg by mouth 2 (two) times daily.    . hydrochlorothiazide (HYDRODIURIL) 25 MG tablet Take by mouth.    . hydrocortisone (CORTEF) 10 MG tablet Take 10 mg by mouth 2 (two) times daily.     . Iron, Ferrous Sulfate, 325 (65 Fe) MG TABS     . Leuprolide Acetate (ELIGARD [place]) Inject into the skin.    Marland Kitchen levothyroxine (SYNTHROID) 50 MCG tablet Take 50 mcg by mouth daily before breakfast.    . Misc Natural Products (OSTEO BI-FLEX ADV JOINT SHIELD PO) Take 1 tablet by mouth daily.     . Multiple Vitamin (MULTIVITAMIN WITH MINERALS) TABS tablet Take 1 tablet by mouth daily. One-A-Day Men's 40+    . NON FORMULARY Take 1 capsule by mouth at bedtime. Pure Encapsulations MotilPro    . polyethylene glycol (MIRALAX / GLYCOLAX) 17 g packet Take 17 g by mouth daily. W/coffee    . Probiotic Product (ALIGN) 4 MG CAPS Take 4 mg by mouth daily.      . rosuvastatin (CRESTOR) 20 MG tablet Take 20 mg by mouth daily.    . Trospium Chloride 60 MG CP24 Take by mouth.    . valsartan (DIOVAN) 320 MG tablet Take 320 mg by mouth daily.    Marland Kitchen venlafaxine (EFFEXOR) 25 MG tablet TAKE 1 TABLET (25 MG TOTAL) BY MOUTH DAILY. 90 tablet 1   No current facility-administered medications for this visit.    OBJECTIVE: There were no vitals filed for this visit.   There is  no height or weight on file to calculate BMI.    ECOG FS:0 - Asymptomatic  Physical Exam Constitutional:      General: Vital signs are normal.     Appearance: Normal appearance.  HENT:     Head: Normocephalic and atraumatic.  Eyes:     Pupils: Pupils are equal, round, and reactive to light.  Cardiovascular:     Rate and Rhythm: Normal rate and regular rhythm.     Heart sounds: Normal heart sounds. No murmur heard.   Pulmonary:     Effort: Pulmonary effort is normal.     Breath sounds: Normal breath sounds. No wheezing.  Abdominal:     General: Bowel sounds are normal. There is no distension.     Palpations: Abdomen is soft.     Tenderness: There is no abdominal tenderness.  Musculoskeletal:        General: No edema. Normal range of motion.     Cervical back: Normal range of motion.     Right ankle: Swelling present.     Left ankle: Swelling present.  Skin:    General: Skin is warm and dry.     Findings: No rash.  Neurological:     Mental Status: He is alert and oriented to person, place, and time.  Psychiatric:        Judgment: Judgment normal.      LAB RESULTS:  Lab Results  Component Value Date   NA 135 11/30/2020   K 3.9 11/30/2020   CL 103 11/30/2020   CO2 22 11/30/2020   GLUCOSE 348 (H) 11/30/2020   BUN 43 (H) 11/30/2020   CREATININE 1.39 (H) 11/30/2020   CALCIUM 9.3 11/30/2020   PROT 6.3 (L) 11/30/2020   ALBUMIN 3.8 11/30/2020   AST 38 11/30/2020   ALT 32 11/30/2020   ALKPHOS 39 11/30/2020   BILITOT 0.8 11/30/2020   GFRNONAA 50 (L) 11/30/2020    GFRAA >60 09/01/2020    Lab Results  Component Value Date   WBC 7.6 12/31/2020   NEUTROABS 6.1 12/31/2020   HGB 8.6 (L) 12/31/2020   HCT 26.3 (L) 12/31/2020   MCV 88.6 12/31/2020   PLT 203 12/31/2020     STUDIES: No results found.  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  PLAN:    1. 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 - PSA is pending from today.  Last PSA from 11/30/2020 was 0.08. - He is currently on Zytiga + monthly Xgeva and every 89-month Eligard. -She received Eligard last month-had bleeding from injection site requiring him to be seen in the ED for application of compression wrap. - Lab work from today shows hemoglobin of 8.6, corrected calcium 9.1, creatinine 1.19.  His blood glucose is 355. -Proceed with Xgeva today.  2.  Bony metastasis:  -Proceed with Xgeva. -Corrected calcium is 9.1.  3.  Dizziness/weakness: - Multifactorial; anemia, dehydration and deconditioning - He has had several falls in the past month.  4. iron deficiency anemia: -Wife reported some dark stools -Hemoglobin today is 8.7 which is a significant decline from previous. -He is on iron supplements. -We did not check his iron stores today but will add them on and go ahead and give him a dose of IV iron given decrease in hemoglobin. -He was previously noted to be iron deficient back in October 2021 with a ferritin of 5.  5.  Urinary frequency: -Has improved some. -She  has been restricting his hydration secondary to frequent urination and declining performance status. -I encouraged him to drink plenty of fluids  6.  Hot flashes: -Secondary to Eligard. -He self stopped Effexor secondary to reading side effect profile.  7. Hyperglycemia: -Patient's glucose has been significantly elevated -Blood sugar today is 355 -We will message Dr. Ammie Ferrier PCP for guidance.  Disposition:   -RTC in 1 week for additional IV iron and labs. -RTC in 1 month for labs, MD assessment and Xgeva.  Patient expressed understanding and was in agreement with this plan. He also understands that He can call clinic at any time with any questions, concerns, or complaints.   Cancer Staging Prostate cancer [facility]) Staging form: Prostate, AJCC 8th Edition - Clinical stage from 02/17/2020: Stage IVB (cT2c, cN0, cM1b, PSA: 8.6, Grade Group: 5) - Signed by Lloyd Huger, MD on 02/17/2020   Jacquelin Hawking, NP   12/31/2020 11:30 AM

## 2020-12-31 NOTE — Progress Notes (Signed)
Stable at discharge 

## 2021-01-07 ENCOUNTER — Inpatient Hospital Stay: Payer: Medicare Other

## 2021-01-07 VITALS — BP 122/78 | HR 62 | Temp 98.0°F | Resp 18

## 2021-01-07 DIAGNOSIS — D509 Iron deficiency anemia, unspecified: Secondary | ICD-10-CM

## 2021-01-07 DIAGNOSIS — C61 Malignant neoplasm of prostate: Secondary | ICD-10-CM

## 2021-01-07 DIAGNOSIS — C7951 Secondary malignant neoplasm of bone: Secondary | ICD-10-CM | POA: Diagnosis not present

## 2021-01-07 LAB — CBC WITH DIFFERENTIAL/PLATELET
Abs Immature Granulocytes: 0.09 10*3/uL — ABNORMAL HIGH (ref 0.00–0.07)
Basophils Absolute: 0 10*3/uL (ref 0.0–0.1)
Basophils Relative: 0 %
Eosinophils Absolute: 0 10*3/uL (ref 0.0–0.5)
Eosinophils Relative: 0 %
HCT: 29 % — ABNORMAL LOW (ref 39.0–52.0)
Hemoglobin: 9.1 g/dL — ABNORMAL LOW (ref 13.0–17.0)
Immature Granulocytes: 1 %
Lymphocytes Relative: 12 %
Lymphs Abs: 0.9 10*3/uL (ref 0.7–4.0)
MCH: 29.6 pg (ref 26.0–34.0)
MCHC: 31.4 g/dL (ref 30.0–36.0)
MCV: 94.5 fL (ref 80.0–100.0)
Monocytes Absolute: 0.5 10*3/uL (ref 0.1–1.0)
Monocytes Relative: 6 %
Neutro Abs: 6.3 10*3/uL (ref 1.7–7.7)
Neutrophils Relative %: 81 %
Platelets: 181 10*3/uL (ref 150–400)
RBC: 3.07 MIL/uL — ABNORMAL LOW (ref 4.22–5.81)
RDW: 21 % — ABNORMAL HIGH (ref 11.5–15.5)
WBC: 7.8 10*3/uL (ref 4.0–10.5)
nRBC: 0.8 % — ABNORMAL HIGH (ref 0.0–0.2)

## 2021-01-07 LAB — FERRITIN: Ferritin: 113 ng/mL (ref 24–336)

## 2021-01-07 LAB — IRON AND TIBC
Iron: 47 ug/dL (ref 45–182)
Saturation Ratios: 14 % — ABNORMAL LOW (ref 17.9–39.5)
TIBC: 346 ug/dL (ref 250–450)
UIBC: 299 ug/dL

## 2021-01-07 MED ORDER — SODIUM CHLORIDE 0.9 % IV SOLN
510.0000 mg | Freq: Once | INTRAVENOUS | Status: AC
  Administered 2021-01-07: 510 mg via INTRAVENOUS
  Filled 2021-01-07: qty 510

## 2021-01-07 MED ORDER — SODIUM CHLORIDE 0.9 % IV SOLN
INTRAVENOUS | Status: DC
  Filled 2021-01-07: qty 250

## 2021-01-07 NOTE — Progress Notes (Signed)
Pt received prescribed treatment in clinic, pt stable at d/c. 

## 2021-01-08 ENCOUNTER — Encounter: Payer: Self-pay | Admitting: Oncology

## 2021-01-18 ENCOUNTER — Other Ambulatory Visit: Payer: Self-pay

## 2021-01-18 ENCOUNTER — Ambulatory Visit (INDEPENDENT_AMBULATORY_CARE_PROVIDER_SITE_OTHER): Payer: Medicare Other | Admitting: Vascular Surgery

## 2021-01-18 ENCOUNTER — Encounter (INDEPENDENT_AMBULATORY_CARE_PROVIDER_SITE_OTHER): Payer: Self-pay | Admitting: Vascular Surgery

## 2021-01-18 VITALS — BP 105/66 | HR 76 | Resp 16 | Ht 70.5 in | Wt 228.0 lb

## 2021-01-18 DIAGNOSIS — E119 Type 2 diabetes mellitus without complications: Secondary | ICD-10-CM

## 2021-01-18 DIAGNOSIS — I10 Essential (primary) hypertension: Secondary | ICD-10-CM

## 2021-01-18 DIAGNOSIS — E785 Hyperlipidemia, unspecified: Secondary | ICD-10-CM

## 2021-01-18 DIAGNOSIS — M7989 Other specified soft tissue disorders: Secondary | ICD-10-CM | POA: Insufficient documentation

## 2021-01-18 DIAGNOSIS — L97211 Non-pressure chronic ulcer of right calf limited to breakdown of skin: Secondary | ICD-10-CM

## 2021-01-18 DIAGNOSIS — R600 Localized edema: Secondary | ICD-10-CM

## 2021-01-18 NOTE — Patient Instructions (Signed)
Edema  Edema is when you have too much fluid in your body or under your skin. Edema may make your legs, feet, and ankles swell up. Swelling is also common in looser tissues, like around your eyes. This is a common condition. It gets more common as you get older. There are many possible causes of edema. Eating too much salt (sodium) and being on your feet or sitting for a long time can cause edema in your legs, feet, and ankles. Hot weather may make edema worse. Edema is usually painless. Your skin may look swollen or shiny. Follow these instructions at home:  Keep the swollen body part raised (elevated) above the level of your heart when you are sitting or lying down.  Do not sit still or stand for a long time.  Do not wear tight clothes. Do not wear garters on your upper legs.  Exercise your legs. This can help the swelling go down.  Wear elastic bandages or support stockings as told by your doctor.  Eat a low-salt (low-sodium) diet to reduce fluid as told by your doctor.  Depending on the cause of your swelling, you may need to limit how much fluid you drink (fluid restriction).  Take over-the-counter and prescription medicines only as told by your doctor. Contact a doctor if:  Treatment is not working.  You have heart, liver, or kidney disease and have symptoms of edema.  You have sudden and unexplained weight gain. Get help right away if:  You have shortness of breath or chest pain.  You cannot breathe when you lie down.  You have pain, redness, or warmth in the swollen areas.  You have heart, liver, or kidney disease and get edema all of a sudden.  You have a fever and your symptoms get worse all of a sudden. Summary  Edema is when you have too much fluid in your body or under your skin.  Edema may make your legs, feet, and ankles swell up. Swelling is also common in looser tissues, like around your eyes.  Raise (elevate) the swollen body part above the level of your  heart when you are sitting or lying down.  Follow your doctor's instructions about diet and how much fluid you can drink (fluid restriction). This information is not intended to replace advice given to you by your health care provider. Make sure you discuss any questions you have with your health care provider. Document Revised: 09/22/2020 Document Reviewed: 09/22/2020 Elsevier Patient Education  2021 Elsevier Inc.  

## 2021-01-18 NOTE — Assessment & Plan Note (Signed)
A 3 layer Unna boot was replaced today and will be changed weekly.  We are happy to do it here or he can have done his podiatrist office who started the wraps.

## 2021-01-18 NOTE — Assessment & Plan Note (Signed)
I have had a long discussion with the patient regarding swelling and why it  causes symptoms.  Given the ulceration and prominent swelling that has already improved somewhat with Unna boots, were going to continue the Unna boots until the skin is completely healed.  A 3 layer Unna boot was placed today and we changed weekly.  Once his wounds are healed, we will be able to come out of Unna boots and patient will begin wearing graduated compression stockings class 1 (20-30 mmHg) on a daily basis. The patient will  beginning wearing the stockings first thing in the morning and removing them in the evening. The patient is instructed specifically not to sleep in the stockings.   In addition, behavioral modification will be initiated.  This will include frequent elevation, use of over the counter pain medications and exercise such as walking.  I have reviewed systemic causes for chronic edema such as liver, kidney and cardiac etiologies.  The patient denies problems with these organ systems.    Consideration for a lymph pump will also be made based upon the effectiveness of conservative therapy.  This would help to improve the edema control and prevent sequela such as ulcers and infections   Patient should undergo duplex ultrasound of the venous system to ensure that DVT or reflux is not present.  The patient will follow-up with me after the ultrasound.

## 2021-01-18 NOTE — Assessment & Plan Note (Signed)
blood glucose control important in reducing the progression of atherosclerotic disease. Also, involved in wound healing. On appropriate medications.  

## 2021-01-18 NOTE — Assessment & Plan Note (Signed)
blood pressure control important in reducing the progression of atherosclerotic disease. On appropriate oral medications.  

## 2021-01-18 NOTE — Progress Notes (Signed)
Patient ID: Andrew Callahan, male   DOB: [birth year], 87 y.o.   MRN: [id number]  Chief Complaint  Patient presents with  . New Patient (Initial Visit)    Ref Baker edema,cvi,venous ulcer    HPI Andrew Callahan is a 87 y.o. male.  I am asked to see the patient by Dr. Luana Shu for evaluation of leg swelling and ulceration on the right.  He has swelling on the left but no current ulceration.  His leg swelling is gotten quite dramatic over the past couple of weeks and was started in [employer] last week.  This has resulted in significant improvement in the weeping and ulceration on the right leg as well as some reduction in the swelling bilaterally.  There is still some swelling.  His wife reports he is actually been having ankle and lower leg swelling for months now.  There is no clear cause or inciting event that started the swelling.  No current chest pain or shortness of breath.  His wife says that his activity level is very poor.  He sits with his leg dependent most of the day.     Past Medical History:  Diagnosis Date  . Chronic constipation   . Elevated prostate specific antigen (PSA)   . Family history of leukemia   . Family history of lung cancer   . History of kidney stones   . Hyperlipidemia   . Hypertension   . Hypothyroid   . Hypothyroidism   . Paroxysmal A-fib ([place])   . Sinus bradycardia   . Sleep apnea    uses CPAP  . Squamous cell carcinoma of skin 2014   Right hand. Mohs.  . Stroke ([place])    throat / slight difficulty swallowing    Past Surgical History:  Procedure Laterality Date  . BACK SURGERY  1997   L3, 4, 5 laminectomy   . BACK SURGERY  1998   hard wire removed, fusion of L3-5  . BACK SURGERY  2009   laminotomy/foraminotomy w/ decompression of nerve root, facet thermal ablation L2-3.   Marland Kitchen HOLEP-LASER ENUCLEATION OF THE PROSTATE WITH MORCELLATION N/A 06/18/2020   Procedure: HOLEP-LASER ENUCLEATION OF THE PROSTATE WITH MORCELLATION;  Surgeon: Billey Co, MD;   Location: [facility] ORS;  Service: Urology;  Laterality: N/A;  . KIDNEY STONE SURGERY  2010  . KNEE SURGERY Left 2007   Arthroscopic partial knee replacement, replaced medial side   . MOHS SURGERY Right 2014   squamous cell carcinoma  . NECK SURGERY  2008   fusion with hardwire in place C3 through C7   . pituitary adenoma  1994   benign  . PROSTATE BIOPSY N/A 01/01/2020   Procedure: PROSTATE BIOPSY URO NAV FUSION;  Surgeon: Royston Cowper, MD;  Location: [facility] ORS;  Service: Urology;  Laterality: N/A;  . SPINE SURGERY    . SPINE SURGERY  2016   fusion L4      Family History  Problem Relation Age of Onset  . Stroke Mother   . Heart attack Father   . Leukemia Sister 26  . Lung cancer Brother 12     Social History   Tobacco Use  . Smoking status: Former Research scientist (life sciences)  . Smokeless tobacco: Never Used  . Tobacco comment: quit 50 years ago  Vaping Use  . Vaping Use: Never used  Substance Use Topics  . Alcohol use: Yes    Comment: rarely  . Drug use: Never     Allergies  Allergen  Reactions  . Metoprolol Other (See Comments)    dizziness   . Rivaroxaban Other (See Comments)    hematuria     Current Outpatient Medications  Medication Sig Dispense Refill  . abiraterone acetate (ZYTIGA) 250 MG tablet Take 4 tablets (1,000 mg total) by mouth daily. Take on an empty stomach 1 hour before or 2 hours after a meal 120 tablet 2  . B Complex-C (SUPER B COMPLEX PO) Take 1 capsule by mouth daily.     . Calcium-Magnesium-Vitamin D (CALCIUM 1200+D3 PO) Take 2 tablets by mouth daily.    . cephALEXin (KEFLEX) 500 MG capsule Take 500 mg by mouth 2 (two) times daily.    . cetirizine (ZYRTEC) 10 MG tablet Take 10 mg by mouth at bedtime.    Marland Kitchen denosumab (XGEVA) 120 MG/1.7ML SOLN injection Inject 120 mg into the skin every 28 (twenty-eight) days.     Marland Kitchen ELIQUIS 5 MG TABS tablet Take 5 mg by mouth 2 (two) times daily.    . furosemide (LASIX) 40 MG tablet Take 40 mg by mouth daily.    .  hydrocortisone (CORTEF) 10 MG tablet Take 10 mg by mouth 2 (two) times daily.     . Iron, Ferrous Sulfate, 325 (65 Fe) MG TABS     . Leuprolide Acetate (ELIGARD [place]) Inject into the skin.    Marland Kitchen levothyroxine (SYNTHROID) 50 MCG tablet Take 50 mcg by mouth daily before breakfast.    . Misc Natural Products (OSTEO BI-FLEX ADV JOINT SHIELD PO) Take 1 tablet by mouth daily.     . Multiple Vitamin (MULTIVITAMIN WITH MINERALS) TABS tablet Take 1 tablet by mouth daily. One-A-Day Men's 67+    . pantoprazole (PROTONIX) 40 MG tablet Take by mouth.    . polyethylene glycol (MIRALAX / GLYCOLAX) 17 g packet Take 17 g by mouth daily. W/coffee    . Probiotic Product (ALIGN) 4 MG CAPS Take 4 mg by mouth daily.     . rosuvastatin (CRESTOR) 20 MG tablet Take 20 mg by mouth daily.    . valsartan (DIOVAN) 320 MG tablet Take 320 mg by mouth daily.    . hydrochlorothiazide (HYDRODIURIL) 25 MG tablet Take by mouth. (Patient not taking: Reported on 12/31/2020)    . Trospium Chloride 60 MG CP24 Take by mouth.    . venlafaxine (EFFEXOR) 25 MG tablet TAKE 1 TABLET (25 MG TOTAL) BY MOUTH DAILY. (Patient not taking: Reported on 12/31/2020) 90 tablet 1   No current facility-administered medications for this visit.      REVIEW OF SYSTEMS (Negative unless checked)  Constitutional: [] Weight loss  [] Fever  [] Chills Cardiac: [] Chest pain   [] Chest pressure   [] Palpitations   [] Shortness of breath when laying flat   [] Shortness of breath at rest   [] Shortness of breath with exertion. Vascular:  [] Pain in legs with walking   [] Pain in legs at rest   [] Pain in legs when laying flat   [] Claudication   [] Pain in feet when walking  [] Pain in feet at rest  [] Pain in feet when laying flat   [] History of DVT   [] Phlebitis   [x] Swelling in legs   [] Varicose veins   [x] Non-healing ulcers Pulmonary:   [] Uses home oxygen   [] Productive cough   [] Hemoptysis   [] Wheeze  [] COPD   [] Asthma Neurologic:  [] Dizziness  [] Blackouts   [] Seizures    [] History of stroke   [] History of TIA  [] Aphasia   [] Temporary blindness   [] Dysphagia   []   Weakness or numbness in arms   [] Weakness or numbness in legs Musculoskeletal:  [x] Arthritis   [] Joint swelling   [] Joint pain   [] Low back pain Hematologic:  [] Easy bruising  [] Easy bleeding   [] Hypercoagulable state   [] Anemic  [] Hepatitis Gastrointestinal:  [] Blood in stool   [] Vomiting blood  [x] Gastroesophageal reflux/heartburn   [] Abdominal pain Genitourinary:  [] Chronic kidney disease   [] Difficult urination  [] Frequent urination  [] Burning with urination   [] Hematuria Skin:  [] Rashes   [x] Ulcers   [x] Wounds Psychological:  [] History of anxiety   []  History of major depression.    Physical Exam BP 105/66 (BP Location: Left Arm)   Pulse 76   Resp 16   Ht 5' 10.5" (1.791 m)   Wt 228 lb (103.4 kg)   BMI 32.25 kg/m  Gen:  WD/WN, NAD.  Appears younger than stated age Head: [place]/AT, No temporalis wasting.  Ear/Nose/Throat: Hearing grossly intact, nares w/o erythema or drainage, oropharynx w/o Erythema/Exudate Eyes: Conjunctiva clear, sclera non-icteric  Neck: trachea midline.  No JVD.  Pulmonary:  Good air movement, respirations not labored, no use of accessory muscles  Cardiac: RRR, no JVD Vascular:  Vessel Right Left  Radial Palpable Palpable                          DP  2+  2+  PT  1+  1+   Gastrointestinal:. No masses, surgical incisions, or scars. Musculoskeletal: M/S 5/5 throughout.  Extremities without ischemic changes.  No deformity or atrophy.  Small superficial ulcerations on the right anterior lateral lower leg.  1+ bilateral lower extremity edema. Neurologic: Sensation grossly intact in extremities.  Symmetrical.  Speech is fluent. Motor exam as listed above. Psychiatric: Judgment intact, Mood & affect appropriate for pt's clinical situation. Dermatologic: Superficial wound on the right leg as above    Radiology No results found.  Labs Recent Results (from the past  2160 hour(s))  Comprehensive metabolic panel     Status: Abnormal   Collection Time: 10/27/20 10:05 AM  Result Value Ref Range   Sodium 141 135 - 145 mmol/L   Potassium 4.0 3.5 - 5.1 mmol/L   Chloride 107 98 - 111 mmol/L   CO2 24 22 - 32 mmol/L   Glucose, Bld 129 (H) 70 - 99 mg/dL    Comment: Glucose reference range applies only to samples taken after fasting for at least 8 hours.   BUN 16 8 - 23 mg/dL   Creatinine, Ser 0.93 0.61 - 1.24 mg/dL   Calcium 9.3 8.9 - 10.3 mg/dL   Total Protein 6.7 6.5 - 8.1 g/dL   Albumin 3.8 3.5 - 5.0 g/dL   AST 30 15 - 41 U/L   ALT 16 0 - 44 U/L   Alkaline Phosphatase 61 38 - 126 U/L   Total Bilirubin 0.7 0.3 - 1.2 mg/dL   GFR, Estimated >60 >60 mL/min    Comment: (NOTE) Calculated using the CKD-EPI Creatinine Equation (2021)    Anion gap 10 5 - 15    Comment: Performed at [facility], [address] [place], [place] [address]  CBC with Differential/Platelet     Status: Abnormal   Collection Time: 10/27/20 10:05 AM  Result Value Ref Range   WBC 7.7 4.0 - 10.5 K/uL   RBC 4.37 4.22 - 5.81 MIL/uL   Hemoglobin 10.9 (L) 13.0 - 17.0 g/dL   HCT 36.0 (L) 39.0 - 52.0 %   MCV 82.4  80.0 - 100.0 fL   MCH 24.9 (L) 26.0 - 34.0 pg   MCHC 30.3 30.0 - 36.0 g/dL   RDW 26.2 (H) 11.5 - 15.5 %   Platelets 224 150 - 400 K/uL   nRBC 0.0 0.0 - 0.2 %   Neutrophils Relative % 76 %   Neutro Abs 5.9 1.7 - 7.7 K/uL   Lymphocytes Relative 14 %   Lymphs Abs 1.1 0.7 - 4.0 K/uL   Monocytes Relative 7 %   Monocytes Absolute 0.6 0.1 - 1.0 K/uL   Eosinophils Relative 1 %   Eosinophils Absolute 0.1 0.0 - 0.5 K/uL   Basophils Relative 1 %   Basophils Absolute 0.0 0.0 - 0.1 K/uL   Immature Granulocytes 1 %   Abs Immature Granulocytes 0.04 0.00 - 0.07 K/uL    Comment: Performed at [facility], [address] [place], [place] [address]  PSA, total and free     Status: None   Collection Time: 10/27/20 10:05 AM  Result Value Ref Range   PSA, Free 0.01 N/A  ng/mL    Comment: Roche ECLIA methodology.   PSA, Free Pct >10.0 %    Comment: (NOTE) The table below lists the probability of prostate cancer for men with non-suspicious DRE results and total PSA between 4 and 10 ng/mL, by patient age Ricci Barker, [place], [id number]).                  % Free PSA       50-64 yr        65-75 yr                  0.00-10.00%        56%             55%                 10.01-15.00%        24%             35%                 15.01-20.00%        17%             23%                 20.01-25.00%        10%             20%                      >25.00%         5%              9% Please note:  Catalona et al did not make specific              recommendations regarding the use of              percent free PSA for any other population              of men. Performed At: [facility] [place], [place] [id number] Rush Farmer MD MO:[id number]    Prostate Specific Ag, Serum <0.1 ng/mL    Comment: (NOTE)              No patient age and/or gender provided  or "N" placed in gender box             Age                Male          Male           All Ages          0.0 - 4.0      Not Estab. Roche ECLIA methodology. According to the American Urological Association, Serum PSA should decrease and remain at undetectable levels after radical prostatectomy. The AUA defines biochemical recurrence as an initial PSA value 0.2 ng/mL or greater followed by a subsequent confirmatory PSA value 0.2 ng/mL or greater. Values obtained with different assay methods or kits cannot be used interchangeably. Results cannot be interpreted as absolute evidence of the presence or absence of malignant disease.   Urinalysis, Complete     Status: Abnormal   Collection Time: 10/27/20  3:34 PM  Result Value Ref Range   Specific Gravity, UA 1.020 1.005 - 1.030   pH, UA 7.0 5.0 - 7.5   Color, UA Yellow Yellow   Appearance Ur Cloudy (A) Clear    Leukocytes,UA Negative Negative   Protein,UA Negative Negative/Trace   Glucose, UA Negative Negative   Ketones, UA Negative Negative   RBC, UA Negative Negative   Bilirubin, UA Negative Negative   Urobilinogen, Ur 0.2 0.2 - 1.0 mg/dL   Nitrite, UA Negative Negative   Microscopic Examination See below:     Comment: Results from sub-optimal specimen performed at the client's request. Interpret result(s) with caution. Microscopic exam performed on unspun urine. Reference range(s) not established for this type of specimen.   Microscopic Examination     Status: Abnormal   Collection Time: 10/27/20  3:34 PM   Urine  Result Value Ref Range   WBC, UA 0-5 0 - 5 /hpf   RBC 0-2 0 - 2 /hpf   Epithelial Cells (non renal) 0-10 0 - 10 /hpf   Crystals Present (A) N/A   Crystal Type Amorphous Sediment N/A   Bacteria, UA Few None seen/Few  BLADDER SCAN AMB NON-IMAGING     Status: None   Collection Time: 10/27/20  3:57 PM  Result Value Ref Range   Scan Result 67ml   PSA     Status: None   Collection Time: 11/30/20 10:00 AM  Result Value Ref Range   Prostatic Specific Antigen 0.08 0.00 - 4.00 ng/mL    Comment: (NOTE) While PSA levels of <=4.0 ng/ml are reported as reference range, some men with levels below 4.0 ng/ml can have prostate cancer and many men with PSA above 4.0 ng/ml do not have prostate cancer.  Other tests such as free PSA, age specific reference ranges, PSA velocity and PSA doubling time may be helpful especially in men less than 52 years old. Performed at [facility], [place] [address], [place], [place] [address]   Comprehensive metabolic panel     Status: Abnormal   Collection Time: 11/30/20 10:08 AM  Result Value Ref Range   Sodium 135 135 - 145 mmol/L   Potassium 3.9 3.5 - 5.1 mmol/L   Chloride 103 98 - 111 mmol/L   CO2 22 22 - 32 mmol/L   Glucose, Bld 348 (H) 70 - 99 mg/dL    Comment: Glucose reference range applies only to samples taken after fasting for at  least 8 hours.   BUN 43 (H) 8 - 23 mg/dL  Creatinine, Ser 1.39 (H) 0.61 - 1.24 mg/dL   Calcium 9.3 8.9 - 10.3 mg/dL   Total Protein 6.3 (L) 6.5 - 8.1 g/dL   Albumin 3.8 3.5 - 5.0 g/dL   AST 38 15 - 41 U/L   ALT 32 0 - 44 U/L   Alkaline Phosphatase 39 38 - 126 U/L   Total Bilirubin 0.8 0.3 - 1.2 mg/dL   GFR, Estimated 50 (L) >60 mL/min    Comment: (NOTE) Calculated using the CKD-EPI Creatinine Equation (2021)    Anion gap 10 5 - 15    Comment: Performed at [facility], [address] [place], [place] [address]  CBC with Differential/Platelet     Status: Abnormal   Collection Time: 11/30/20 10:08 AM  Result Value Ref Range   WBC 12.6 (H) 4.0 - 10.5 K/uL   RBC 3.88 (L) 4.22 - 5.81 MIL/uL   Hemoglobin 10.8 (L) 13.0 - 17.0 g/dL   HCT 32.8 (L) 39.0 - 52.0 %   MCV 84.5 80.0 - 100.0 fL   MCH 27.8 26.0 - 34.0 pg   MCHC 32.9 30.0 - 36.0 g/dL   RDW 22.0 (H) 11.5 - 15.5 %   Platelets 161 150 - 400 K/uL   nRBC 0.0 0.0 - 0.2 %   Neutrophils Relative % 84 %   Neutro Abs 10.6 (H) 1.7 - 7.7 K/uL   Lymphocytes Relative 8 %   Lymphs Abs 1.0 0.7 - 4.0 K/uL   Monocytes Relative 7 %   Monocytes Absolute 0.8 0.1 - 1.0 K/uL   Eosinophils Relative 0 %   Eosinophils Absolute 0.0 0.0 - 0.5 K/uL   Basophils Relative 0 %   Basophils Absolute 0.0 0.0 - 0.1 K/uL   Immature Granulocytes 1 %   Abs Immature Granulocytes 0.14 (H) 0.00 - 0.07 K/uL    Comment: Performed at [facility], [address] [place], [place] [address]  Comprehensive metabolic panel     Status: Abnormal   Collection Time: 12/31/20 11:00 AM  Result Value Ref Range   Sodium 137 135 - 145 mmol/L   Potassium 4.1 3.5 - 5.1 mmol/L   Chloride 110 98 - 111 mmol/L   CO2 19 (L) 22 - 32 mmol/L   Glucose, Bld 355 (H) 70 - 99 mg/dL    Comment: Glucose reference range applies only to samples taken after fasting for at least 8 hours.   BUN 25 (H) 8 - 23 mg/dL   Creatinine, Ser 1.19 0.61 - 1.24 mg/dL   Calcium 8.6 (L) 8.9  - 10.3 mg/dL   Total Protein 5.7 (L) 6.5 - 8.1 g/dL   Albumin 3.2 (L) 3.5 - 5.0 g/dL   AST 36 15 - 41 U/L   ALT 28 0 - 44 U/L   Alkaline Phosphatase 48 38 - 126 U/L   Total Bilirubin 0.7 0.3 - 1.2 mg/dL   GFR, Estimated >60 >60 mL/min    Comment: (NOTE) Calculated using the CKD-EPI Creatinine Equation (2021)    Anion gap 8 5 - 15    Comment: Performed at [facility], [address] [place], [place] [address]  CBC with Differential/Platelet     Status: Abnormal   Collection Time: 12/31/20 11:00 AM  Result Value Ref Range   WBC 7.6 4.0 - 10.5 K/uL   RBC 2.97 (L) 4.22 - 5.81 MIL/uL   Hemoglobin 8.6 (L) 13.0 - 17.0 g/dL   HCT 26.3 (L) 39.0 - 52.0 %   MCV 88.6  80.0 - 100.0 fL   MCH 29.0 26.0 - 34.0 pg   MCHC 32.7 30.0 - 36.0 g/dL   RDW 18.5 (H) 11.5 - 15.5 %   Platelets 203 150 - 400 K/uL   nRBC 0.0 0.0 - 0.2 %   Neutrophils Relative % 81 %   Neutro Abs 6.1 1.7 - 7.7 K/uL   Lymphocytes Relative 12 %   Lymphs Abs 0.9 0.7 - 4.0 K/uL   Monocytes Relative 6 %   Monocytes Absolute 0.5 0.1 - 1.0 K/uL   Eosinophils Relative 0 %   Eosinophils Absolute 0.0 0.0 - 0.5 K/uL   Basophils Relative 0 %   Basophils Absolute 0.0 0.0 - 0.1 K/uL   Immature Granulocytes 1 %   Abs Immature Granulocytes 0.10 (H) 0.00 - 0.07 K/uL    Comment: Performed at [facility], [address] [place], [place] [address]  PSA     Status: None   Collection Time: 12/31/20 11:01 AM  Result Value Ref Range   Prostatic Specific Antigen 0.10 0.00 - 4.00 ng/mL    Comment: (NOTE) While PSA levels of <=4.0 ng/ml are reported as reference range, some men with levels below 4.0 ng/ml can have prostate cancer and many men with PSA above 4.0 ng/ml do not have prostate cancer.  Other tests such as free PSA, age specific reference ranges, PSA velocity and PSA doubling time may be helpful especially in men less than 45 years old. Performed at [facility], [place] [address] [place], [place] [address]    CBC with Differential/Platelet     Status: Abnormal   Collection Time: 01/07/21 11:07 AM  Result Value Ref Range   WBC 7.8 4.0 - 10.5 K/uL   RBC 3.07 (L) 4.22 - 5.81 MIL/uL   Hemoglobin 9.1 (L) 13.0 - 17.0 g/dL   HCT 29.0 (L) 39.0 - 52.0 %   MCV 94.5 80.0 - 100.0 fL   MCH 29.6 26.0 - 34.0 pg   MCHC 31.4 30.0 - 36.0 g/dL   RDW 21.0 (H) 11.5 - 15.5 %   Platelets 181 150 - 400 K/uL   nRBC 0.8 (H) 0.0 - 0.2 %   Neutrophils Relative % 81 %   Neutro Abs 6.3 1.7 - 7.7 K/uL   Lymphocytes Relative 12 %   Lymphs Abs 0.9 0.7 - 4.0 K/uL   Monocytes Relative 6 %   Monocytes Absolute 0.5 0.1 - 1.0 K/uL   Eosinophils Relative 0 %   Eosinophils Absolute 0.0 0.0 - 0.5 K/uL   Basophils Relative 0 %   Basophils Absolute 0.0 0.0 - 0.1 K/uL   Immature Granulocytes 1 %   Abs Immature Granulocytes 0.09 (H) 0.00 - 0.07 K/uL    Comment: Performed at [facility], [address] [place], [place] [address]  Ferritin     Status: None   Collection Time: 01/07/21 11:07 AM  Result Value Ref Range   Ferritin 113 24 - 336 ng/mL    Comment: Performed at [facility], [address] [place], [place] [address]  Iron and TIBC     Status: Abnormal   Collection Time: 01/07/21 11:07 AM  Result Value Ref Range   Iron 47 45 - 182 ug/dL   TIBC 346 250 - 450 ug/dL   Saturation Ratios 14 (L) 17.9 - 39.5 %   UIBC 299 ug/dL    Comment: Performed at [facility], [address] [place], [place] [address]    Assessment/Plan:  Lower limb ulcer, calf, right, limited to breakdown of skin (HCC) A 3 layer Unna boot was replaced today and will be changed weekly.  We are happy to do it here or he can have done his podiatrist office who started the wraps.  Type 2 diabetes mellitus without complication, without long-term current use of insulin (HCC) blood glucose control important in reducing the progression of atherosclerotic disease. Also, involved in wound healing. On appropriate  medications.   Hyperlipidemia lipid control important in reducing the progression of atherosclerotic disease. Continue statin therapy   Benign essential hypertension blood pressure control important in reducing the progression of atherosclerotic disease. On appropriate oral medications.   Swelling of limb I have had a long discussion with the patient regarding swelling and why it  causes symptoms.  Given the ulceration and prominent swelling that has already improved somewhat with Unna boots, were going to continue the Unna boots until the skin is completely healed.  A 3 layer Unna boot was placed today and we changed weekly.  Once his wounds are healed, we will be able to come out of Unna boots and patient will begin wearing graduated compression stockings class 1 (20-30 mmHg) on a daily basis. The patient will  beginning wearing the stockings first thing in the morning and removing them in the evening. The patient is instructed specifically not to sleep in the stockings.   In addition, behavioral modification will be initiated.  This will include frequent elevation, use of over the counter pain medications and exercise such as walking.  I have reviewed systemic causes for chronic edema such as liver, kidney and cardiac etiologies.  The patient denies problems with these organ systems.    Consideration for a lymph pump will also be made based upon the effectiveness of conservative therapy.  This would help to improve the edema control and prevent sequela such as ulcers and infections   Patient should undergo duplex ultrasound of the venous system to ensure that DVT or reflux is not present.  The patient will follow-up with me after the ultrasound.        Leotis Pain 01/18/2021, 4:55 PM   This note was created with Dragon medical transcription system.  Any errors from dictation are unintentional.

## 2021-01-18 NOTE — Assessment & Plan Note (Signed)
lipid control important in reducing the progression of atherosclerotic disease. Continue statin therapy  

## 2021-01-20 ENCOUNTER — Ambulatory Visit: Payer: Medicare Other | Admitting: Urology

## 2021-01-27 ENCOUNTER — Other Ambulatory Visit: Payer: Self-pay

## 2021-01-27 ENCOUNTER — Ambulatory Visit (INDEPENDENT_AMBULATORY_CARE_PROVIDER_SITE_OTHER): Payer: Medicare Other

## 2021-01-27 ENCOUNTER — Encounter (INDEPENDENT_AMBULATORY_CARE_PROVIDER_SITE_OTHER): Payer: Self-pay | Admitting: Nurse Practitioner

## 2021-01-27 ENCOUNTER — Ambulatory Visit (INDEPENDENT_AMBULATORY_CARE_PROVIDER_SITE_OTHER): Payer: Medicare Other | Admitting: Nurse Practitioner

## 2021-01-27 VITALS — BP 157/76 | HR 57 | Resp 14 | Ht 70.0 in | Wt 228.0 lb

## 2021-01-27 DIAGNOSIS — I700 Atherosclerosis of aorta: Secondary | ICD-10-CM | POA: Insufficient documentation

## 2021-01-27 DIAGNOSIS — I890 Lymphedema, not elsewhere classified: Secondary | ICD-10-CM

## 2021-01-27 DIAGNOSIS — L97211 Non-pressure chronic ulcer of right calf limited to breakdown of skin: Secondary | ICD-10-CM

## 2021-01-27 DIAGNOSIS — I10 Essential (primary) hypertension: Secondary | ICD-10-CM

## 2021-01-27 DIAGNOSIS — M7989 Other specified soft tissue disorders: Secondary | ICD-10-CM

## 2021-01-27 DIAGNOSIS — R600 Localized edema: Secondary | ICD-10-CM

## 2021-01-28 ENCOUNTER — Other Ambulatory Visit: Payer: Self-pay

## 2021-01-28 ENCOUNTER — Inpatient Hospital Stay (HOSPITAL_BASED_OUTPATIENT_CLINIC_OR_DEPARTMENT_OTHER): Payer: Medicare Other | Admitting: Oncology

## 2021-01-28 ENCOUNTER — Inpatient Hospital Stay: Payer: Medicare Other

## 2021-01-28 ENCOUNTER — Inpatient Hospital Stay: Payer: Medicare Other | Attending: Oncology

## 2021-01-28 VITALS — BP 138/58 | HR 66 | Temp 97.7°F | Resp 16 | Wt 235.8 lb

## 2021-01-28 DIAGNOSIS — D509 Iron deficiency anemia, unspecified: Secondary | ICD-10-CM | POA: Diagnosis not present

## 2021-01-28 DIAGNOSIS — C61 Malignant neoplasm of prostate: Secondary | ICD-10-CM

## 2021-01-28 DIAGNOSIS — C7951 Secondary malignant neoplasm of bone: Secondary | ICD-10-CM | POA: Insufficient documentation

## 2021-01-28 LAB — COMPREHENSIVE METABOLIC PANEL
ALT: 45 U/L — ABNORMAL HIGH (ref 0–44)
AST: 49 U/L — ABNORMAL HIGH (ref 15–41)
Albumin: 3.3 g/dL — ABNORMAL LOW (ref 3.5–5.0)
Alkaline Phosphatase: 45 U/L (ref 38–126)
Anion gap: 10 (ref 5–15)
BUN: 22 mg/dL (ref 8–23)
CO2: 20 mmol/L — ABNORMAL LOW (ref 22–32)
Calcium: 8.3 mg/dL — ABNORMAL LOW (ref 8.9–10.3)
Chloride: 107 mmol/L (ref 98–111)
Creatinine, Ser: 1.06 mg/dL (ref 0.61–1.24)
GFR, Estimated: >60 mL/min (ref >60)
Glucose, Bld: 392 mg/dL — ABNORMAL HIGH (ref 70–99)
Potassium: 4.4 mmol/L (ref 3.5–5.1)
Sodium: 137 mmol/L (ref 135–145)
Total Bilirubin: 0.4 mg/dL (ref 0.3–1.2)
Total Protein: 5.6 g/dL — ABNORMAL LOW (ref 6.5–8.1)

## 2021-01-28 LAB — CBC WITH DIFFERENTIAL/PLATELET
Abs Immature Granulocytes: 0.05 10*3/uL (ref 0.00–0.07)
Basophils Absolute: 0 10*3/uL (ref 0.0–0.1)
Basophils Relative: 0 %
Eosinophils Absolute: 0 10*3/uL (ref 0.0–0.5)
Eosinophils Relative: 0 %
HCT: 27 % — ABNORMAL LOW (ref 39.0–52.0)
Hemoglobin: 8.2 g/dL — ABNORMAL LOW (ref 13.0–17.0)
Immature Granulocytes: 1 %
Lymphocytes Relative: 14 %
Lymphs Abs: 1 10*3/uL (ref 0.7–4.0)
MCH: 31.1 pg (ref 26.0–34.0)
MCHC: 30.4 g/dL (ref 30.0–36.0)
MCV: 102.3 fL — ABNORMAL HIGH (ref 80.0–100.0)
Monocytes Absolute: 0.5 10*3/uL (ref 0.1–1.0)
Monocytes Relative: 7 %
Neutro Abs: 5.4 10*3/uL (ref 1.7–7.7)
Neutrophils Relative %: 78 %
Platelets: 219 10*3/uL (ref 150–400)
RBC: 2.64 MIL/uL — ABNORMAL LOW (ref 4.22–5.81)
RDW: 17.1 % — ABNORMAL HIGH (ref 11.5–15.5)
WBC: 6.9 10*3/uL (ref 4.0–10.5)
nRBC: 0 % (ref 0.0–0.2)

## 2021-01-28 LAB — IRON AND TIBC
Iron: 24 ug/dL — ABNORMAL LOW (ref 45–182)
Saturation Ratios: 7 % — ABNORMAL LOW (ref 17.9–39.5)
TIBC: 351 ug/dL (ref 250–450)
UIBC: 327 ug/dL

## 2021-01-28 LAB — PSA: Prostatic Specific Antigen: 0.06 ng/mL (ref 0.00–4.00)

## 2021-01-28 LAB — FERRITIN: Ferritin: 38 ng/mL (ref 24–336)

## 2021-01-28 MED ORDER — DENOSUMAB 120 MG/1.7ML ~~LOC~~ SOLN
120.0000 mg | Freq: Once | SUBCUTANEOUS | Status: AC
  Administered 2021-01-28: 120 mg via SUBCUTANEOUS
  Filled 2021-01-28: qty 1.7

## 2021-01-28 NOTE — Progress Notes (Signed)
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  INTERVAL HISTORY: Patient returns to clinic today for further evaluation and continuation of Xgeva, Eligard, and Zytiga.  He was last seen in clinic with his wife on 12/31/2020.  In the interim he received 2 doses of IV iron on 12/31/2020 and 01/07/2021.  He was evaluated by vein and vascular for some lower extremity swelling.  He was placed in Unna boots with improvement.  Today, he is not feel any improved.  His performance status has not worsened but is more or less stable.  He continues to be dizzy and very weak.  He uses a walker for stability.  His wife continues to note some dark stools.  He is on oral iron tablets.  His appetite is stable. Denies any additional falls.   REVIEW OF SYSTEMS:   Review of Systems  Constitutional: Positive for malaise/fatigue.  Cardiovascular: Positive for leg swelling.  Musculoskeletal: Positive for falls.  Neurological: Positive for dizziness and weakness.  Psychiatric/Behavioral: The patient has insomnia.     As per HPI. Otherwise, a complete review of systems is negative.  PAST MEDICAL HISTORY: Past Medical History:  Diagnosis Date  . Chronic constipation   . Elevated prostate specific antigen (PSA)   . Family history of leukemia   . Family history of lung cancer   . History of kidney stones   . Hyperlipidemia   . Hypertension   . Hypothyroid   . Hypothyroidism   . Paroxysmal A-fib ([place])   . Sinus bradycardia   . Sleep apnea    uses CPAP  . Squamous cell carcinoma of skin 2014   Right hand. Mohs.  . Stroke ([place])    throat / slight difficulty swallowing    PAST  SURGICAL HISTORY: Past Surgical History:  Procedure Laterality Date  . BACK SURGERY  1997   L3, 4, 5 laminectomy   . BACK SURGERY  1998   hard wire removed, fusion of L3-5  . BACK SURGERY  2009   laminotomy/foraminotomy w/ decompression of nerve root, facet thermal ablation L2-3.   Marland Kitchen HOLEP-LASER ENUCLEATION OF THE PROSTATE WITH MORCELLATION N/A 06/18/2020   Procedure: HOLEP-LASER ENUCLEATION OF THE PROSTATE WITH MORCELLATION;  Surgeon: Billey Co, MD;  Location: [facility] ORS;  Service: Urology;  Laterality: N/A;  . KIDNEY STONE SURGERY  2010  . KNEE SURGERY Left 2007   Arthroscopic partial knee replacement, replaced medial side   . MOHS SURGERY Right 2014   squamous cell carcinoma  . NECK SURGERY  2008   fusion with hardwire in place C3 through C7   . pituitary adenoma  1994   benign  . PROSTATE BIOPSY N/A 01/01/2020   Procedure: PROSTATE BIOPSY URO NAV FUSION;  Surgeon: Royston Cowper, MD;  Location: [facility] ORS;  Service: Urology;  Laterality: N/A;  . SPINE SURGERY    . SPINE SURGERY  2016   fusion L4     FAMILY HISTORY: Family History  Problem Relation Age of Onset  . Stroke Mother   . Heart attack Father   . Leukemia Sister 41  . Lung cancer Brother 67    ADVANCED DIRECTIVES (Y/N):  N  HEALTH MAINTENANCE: Social History   Tobacco Use  .  Smoking status: Former Research scientist (life sciences)  . Smokeless tobacco: Never Used  . Tobacco comment: quit 50 years ago  Vaping Use  . Vaping Use: Never used  Substance Use Topics  . Alcohol use: Yes    Comment: rarely  . Drug use: Never     Colonoscopy:  PAP:  Bone density:  Lipid panel:  Allergies  Allergen Reactions  . Metoprolol Other (See Comments)    dizziness   . Rivaroxaban Other (See Comments)    hematuria     Current Outpatient Medications  Medication Sig Dispense Refill  . abiraterone acetate (ZYTIGA) 250 MG tablet Take 4 tablets (1,000 mg total) by mouth daily. Take on an empty stomach 1 hour before or 2 hours after a  meal 120 tablet 2  . B Complex-C (SUPER B COMPLEX PO) Take 1 capsule by mouth daily.     . Calcium-Magnesium-Vitamin D (CALCIUM 1200+D3 PO) Take 2 tablets by mouth daily.    . cephALEXin (KEFLEX) 500 MG capsule Take 500 mg by mouth 2 (two) times daily.    . cetirizine (ZYRTEC) 10 MG tablet Take 10 mg by mouth at bedtime.    Marland Kitchen denosumab (XGEVA) 120 MG/1.7ML SOLN injection Inject 120 mg into the skin every 28 (twenty-eight) days.     Marland Kitchen ELIQUIS 5 MG TABS tablet Take 5 mg by mouth 2 (two) times daily.    . furosemide (LASIX) 40 MG tablet Take 40 mg by mouth daily.    . hydrochlorothiazide (HYDRODIURIL) 25 MG tablet Take by mouth.    . hydrocortisone (CORTEF) 10 MG tablet Take 10 mg by mouth 2 (two) times daily.     . Iron, Ferrous Sulfate, 325 (65 Fe) MG TABS     . Leuprolide Acetate (ELIGARD [place]) Inject into the skin.    Marland Kitchen levothyroxine (SYNTHROID) 50 MCG tablet Take 50 mcg by mouth daily before breakfast.    . Misc Natural Products (OSTEO BI-FLEX ADV JOINT SHIELD PO) Take 1 tablet by mouth daily.     . Multiple Vitamin (MULTIVITAMIN WITH MINERALS) TABS tablet Take 1 tablet by mouth daily. One-A-Day Men's 73+    . pantoprazole (PROTONIX) 40 MG tablet Take by mouth.    . polyethylene glycol (MIRALAX / GLYCOLAX) 17 g packet Take 17 g by mouth daily. W/coffee    . Probiotic Product (ALIGN) 4 MG CAPS Take 4 mg by mouth daily.     . rosuvastatin (CRESTOR) 20 MG tablet Take 20 mg by mouth daily.    . Trospium Chloride 60 MG CP24 Take by mouth.    . valsartan (DIOVAN) 320 MG tablet Take 320 mg by mouth daily.    Marland Kitchen venlafaxine (EFFEXOR) 25 MG tablet TAKE 1 TABLET (25 MG TOTAL) BY MOUTH DAILY. (Patient not taking: Reported on 12/31/2020) 90 tablet 1   No current facility-administered medications for this visit.    OBJECTIVE: There were no vitals filed for this visit.   There is no height or weight on file to calculate BMI.    ECOG FS:0 - Asymptomatic  Physical Exam Constitutional:      Appearance:  Normal appearance.  HENT:     Head: Normocephalic and atraumatic.  Eyes:     Pupils: Pupils are equal, round, and reactive to light.  Cardiovascular:     Rate and Rhythm: Normal rate and regular rhythm.     Heart sounds: Normal heart sounds. No murmur heard.   Pulmonary:     Effort: Pulmonary effort is normal.  Breath sounds: Normal breath sounds. No wheezing.  Abdominal:     General: Bowel sounds are normal. There is no distension.     Palpations: Abdomen is soft.     Tenderness: There is no abdominal tenderness.  Musculoskeletal:        General: Normal range of motion.     Cervical back: Normal range of motion.     Right ankle: Swelling present.     Left ankle: Swelling present.  Skin:    General: Skin is warm and dry.     Findings: No rash.  Neurological:     Mental Status: He is alert and oriented to person, place, and time.  Psychiatric:        Judgment: Judgment normal.      LAB RESULTS:  Lab Results  Component Value Date   NA 137 01/28/2021   K 4.4 01/28/2021   CL 107 01/28/2021   CO2 20 (L) 01/28/2021   GLUCOSE 392 (H) 01/28/2021   BUN 22 01/28/2021   CREATININE 1.06 01/28/2021   CALCIUM 8.3 (L) 01/28/2021   PROT 5.6 (L) 01/28/2021   ALBUMIN 3.3 (L) 01/28/2021   AST 49 (H) 01/28/2021   ALT 45 (H) 01/28/2021   ALKPHOS 45 01/28/2021   BILITOT 0.4 01/28/2021   GFRNONAA >60 01/28/2021   GFRAA >60 09/01/2020    Lab Results  Component Value Date   WBC 6.9 01/28/2021   NEUTROABS 5.4 01/28/2021   HGB 8.2 (L) 01/28/2021   HCT 27.0 (L) 01/28/2021   MCV 102.3 (H) 01/28/2021   PLT 219 01/28/2021     STUDIES: No results found.  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  PLAN:    1. 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 - PSA from 01/28/21 is 0.06. . - He is currently on Zytiga + monthly Xgeva and every 21-month Eligard. -She received  Eligard last in December 2022-had bleeding from injection site requiring him to be seen in the ED for application of compression wrap. - Lab work from today show a hemoglobin of 8.2, corrected calcium 8.9, creatinine 1.06. -Proceed with Xgeva today.  2.  Bony metastasis:  -Proceed with Xgeva. -Corrected calcium is 8.9.  3.  Dizziness/weakness: - Multifactorial; anemia, dehydration and deconditioning  4. iron deficiency anemia: -Wife reported continued dark stools -Hemoglobin today is 8.2 even after 2 doses IV iron. -He is on iron supplements. -Labs from 01/07/21 show ferritin 113 and iron saturation 14%- He received 2 doses IV feraheme on 12/31/20 and 01/07/21.  -Labs from 01/28/21 show a hemoglobin of 8.2, ferritin of 38 and iron saturation 7%.  -Patient needs 2 additional IV iron infusions along with referral to GI.  5.  Urinary frequency:  -Has improved some. -She has been restricting his hydration secondary to frequent urination and declining performance status. -I encouraged him to drink plenty of fluids  6.  Hot flashes: -Secondary to Eligard. -He self stopped Effexor secondary to reading side effect profile.  7. Hyperglycemia:  -Patient's glucose has been significantly elevated -Blood sugar today is 392. -Has appt with Dr. Sabra Heck next week.   Disposition:  -Get patient set up for two dose of IV Feraheme starting next week.  -Referral sent back to [facility] GI. -RTC in 1 month for labs, MD assessment and Xgeva.  Patient expressed understanding and was in agreement with this plan. He also understands that He can call clinic at any time with  any questions, concerns, or complaints.   Cancer Staging Prostate cancer [facility]) Staging form: Prostate, AJCC 8th Edition - Clinical stage from 02/17/2020: Stage IVB (cT2c, cN0, cM1b, PSA: 8.6, Grade Group: 5) - Signed by Lloyd Huger, MD on 02/17/2020 Prostate specific antigen (PSA) range: Less than 10 Histologic grading system: 5 grade  system   Jacquelin Hawking, NP   01/28/2021 2:12 PM

## 2021-01-30 ENCOUNTER — Encounter (INDEPENDENT_AMBULATORY_CARE_PROVIDER_SITE_OTHER): Payer: Self-pay | Admitting: Nurse Practitioner

## 2021-01-30 ENCOUNTER — Encounter: Payer: Self-pay | Admitting: Oncology

## 2021-01-30 NOTE — Progress Notes (Signed)
Subjective:    Patient ID: Andrew Callahan, male    DOB: 1934-04-20, 87 y.o.   MRN: [id number] Chief Complaint  Patient presents with  . Follow-up    ultrasound    Andrew Callahan is a 87 y.o. male.  The patient presents for evaluation of lower extremity swelling and ulceration following several weeks in Unna wraps.  Today the ulceration has resolved and the swelling is greatly decreased.  The patient has tolerated the wraps well and he has also been trying to elevate his lower extremities more.  No evidence of cellulitis.  There has been no recurrence of weeping.  Noninvasive studies show no evidence of DVT or bilaterally.  No superficial thrombophlebitis in the left lower extremity.  No evidence of deep venous insufficiency bilaterally or superficial venous reflux seen bilaterally.  There is evidence of a previous thrombophlebitis in the right small saphenous vein at the proximal calf.     Review of Systems  Cardiovascular: Positive for leg swelling.       Objective:   Physical Exam Vitals reviewed.  HENT:     Head: Normocephalic.  Cardiovascular:     Rate and Rhythm: Normal rate.     Pulses: Normal pulses.  Pulmonary:     Effort: Pulmonary effort is normal.  Neurological:     Mental Status: He is alert and oriented to person, place, and time.  Psychiatric:        Mood and Affect: Mood normal.        Behavior: Behavior normal.        Thought Content: Thought content normal.        Judgment: Judgment normal.     BP (!) 157/76 (BP Location: Left Arm)   Pulse (!) 57   Resp 14   Ht 5\' 10"  (1.778 m)   Wt 228 lb (103.4 kg)   BMI 32.71 kg/m   Past Medical History:  Diagnosis Date  . Chronic constipation   . Elevated prostate specific antigen (PSA)   . Family history of leukemia   . Family history of lung cancer   . History of kidney stones   . Hyperlipidemia   . Hypertension   . Hypothyroid   . Hypothyroidism   . Paroxysmal A-fib ([place])   . Sinus bradycardia   .  Sleep apnea    uses CPAP  . Squamous cell carcinoma of skin 2014   Right hand. Mohs.  . Stroke ([place])    throat / slight difficulty swallowing    Social History   Socioeconomic History  . Marital status: Married    Spouse name: Not on file  . Number of children: Not on file  . Years of education: Not on file  . Highest education level: Not on file  Occupational History  . Not on file  Tobacco Use  . Smoking status: Former Research scientist (life sciences)  . Smokeless tobacco: Never Used  . Tobacco comment: quit 50 years ago  Vaping Use  . Vaping Use: Never used  Substance and Sexual Activity  . Alcohol use: Yes    Comment: rarely  . Drug use: Never  . Sexual activity: Yes    Birth control/protection: None  Other Topics Concern  . Not on file  Social History Narrative  . Not on file   Social Determinants of Health   Financial Resource Strain: Not on file  Food Insecurity: Not on file  Transportation Needs: Not on file  Physical Activity: Not on file  Stress: Not on  file  Social Connections: Not on file  Intimate Partner Violence: Not on file    Past Surgical History:  Procedure Laterality Date  . BACK SURGERY  1997   L3, 4, 5 laminectomy   . BACK SURGERY  1998   hard wire removed, fusion of L3-5  . BACK SURGERY  2009   laminotomy/foraminotomy w/ decompression of nerve root, facet thermal ablation L2-3.   Marland Kitchen HOLEP-LASER ENUCLEATION OF THE PROSTATE WITH MORCELLATION N/A 06/18/2020   Procedure: HOLEP-LASER ENUCLEATION OF THE PROSTATE WITH MORCELLATION;  Surgeon: Billey Co, MD;  Location: [facility] ORS;  Service: Urology;  Laterality: N/A;  . KIDNEY STONE SURGERY  2010  . KNEE SURGERY Left 2007   Arthroscopic partial knee replacement, replaced medial side   . MOHS SURGERY Right 2014   squamous cell carcinoma  . NECK SURGERY  2008   fusion with hardwire in place C3 through C7   . pituitary adenoma  1994   benign  . PROSTATE BIOPSY N/A 01/01/2020   Procedure: PROSTATE BIOPSY URO NAV  FUSION;  Surgeon: Royston Cowper, MD;  Location: [facility] ORS;  Service: Urology;  Laterality: N/A;  . SPINE SURGERY    . SPINE SURGERY  2016   fusion L4     Family History  Problem Relation Age of Onset  . Stroke Mother   . Heart attack Father   . Leukemia Sister 70  . Lung cancer Brother 61    Allergies  Allergen Reactions  . Metoprolol Other (See Comments)    dizziness   . Rivaroxaban Other (See Comments)    hematuria     CBC Latest Ref Rng & Units 01/28/2021 01/07/2021 12/31/2020  WBC 4.0 - 10.5 K/uL 6.9 7.8 7.6  Hemoglobin 13.0 - 17.0 g/dL 8.2(L) 9.1(L) 8.6(L)  Hematocrit 39.0 - 52.0 % 27.0(L) 29.0(L) 26.3(L)  Platelets 150 - 400 K/uL 219 181 203      CMP     Component Value Date/Time   NA 137 01/28/2021 1336   K 4.4 01/28/2021 1336   CL 107 01/28/2021 1336   CO2 20 (L) 01/28/2021 1336   GLUCOSE 392 (H) 01/28/2021 1336   BUN 22 01/28/2021 1336   CREATININE 1.06 01/28/2021 1336   CALCIUM 8.3 (L) 01/28/2021 1336   PROT 5.6 (L) 01/28/2021 1336   ALBUMIN 3.3 (L) 01/28/2021 1336   AST 49 (H) 01/28/2021 1336   ALT 45 (H) 01/28/2021 1336   ALKPHOS 45 01/28/2021 1336   BILITOT 0.4 01/28/2021 1336   GFRNONAA >60 01/28/2021 1336   GFRAA >60 09/01/2020 0905     No results found.     Assessment & Plan:   1. Lymphedema No surgery or intervention at this point in time.    I have reviewed my discussion with the patient regarding venous insufficiency and lymph edema and why it  causes symptoms. I have discussed with the patient the chronic skin changes that accompany these problems and the long term sequela such as ulceration and infection.  Patient will continue wearing graduated compression stockings class 1 (20-30 mmHg) on a daily basis a prescription was given to the patient to keep this updated. The patient will  put the stockings on first thing in the morning and removing them in the evening. The patient is instructed specifically not to sleep in the stockings.   In addition, behavioral modification including elevation during the day will be continued.  Diet and salt restriction was also discussed.  Previous duplex ultrasound of the  lower extremities shows normal deep venous system, superficial reflux was not present.   Following the review of the ultrasound the patient will follow up in 3 months to reassess the degree of swelling and the control that graduated compression is offering.   The patient can be assessed for a Lymph Pump at that time.  However, at this time the patient states they are satisfied with the control compression and elevation is yielding.    2. Lower limb ulcer, calf, right, limited to breakdown of skin ([place]) Wounds are currently resolved patient will transition to compression stockings as outlined above.  3. Benign essential hypertension Continue antihypertensive medications as already ordered, these medications have been reviewed and there are no changes at this time.    Current Outpatient Medications on File Prior to Visit  Medication Sig Dispense Refill  . abiraterone acetate (ZYTIGA) 250 MG tablet Take 4 tablets (1,000 mg total) by mouth daily. Take on an empty stomach 1 hour before or 2 hours after a meal 120 tablet 2  . B Complex-C (SUPER B COMPLEX PO) Take 1 capsule by mouth daily.     . Calcium-Magnesium-Vitamin D (CALCIUM 1200+D3 PO) Take 2 tablets by mouth daily.    . cetirizine (ZYRTEC) 10 MG tablet Take 10 mg by mouth at bedtime.    Marland Kitchen denosumab (XGEVA) 120 MG/1.7ML SOLN injection Inject 120 mg into the skin every 28 (twenty-eight) days.     Marland Kitchen ELIQUIS 5 MG TABS tablet Take 5 mg by mouth 2 (two) times daily.    . hydrochlorothiazide (HYDRODIURIL) 25 MG tablet Take by mouth. (Patient not taking: Reported on 01/28/2021)    . hydrocortisone (CORTEF) 10 MG tablet Take 10 mg by mouth 2 (two) times daily.     . Iron, Ferrous Sulfate, 325 (65 Fe) MG TABS     . Leuprolide Acetate (ELIGARD [place]) Inject into the skin.    Marland Kitchen  levothyroxine (SYNTHROID) 50 MCG tablet Take 50 mcg by mouth daily before breakfast.    . Misc Natural Products (OSTEO BI-FLEX ADV JOINT SHIELD PO) Take 1 tablet by mouth daily.     . Multiple Vitamin (MULTIVITAMIN WITH MINERALS) TABS tablet Take 1 tablet by mouth daily. One-A-Day Men's 41+    . pantoprazole (PROTONIX) 40 MG tablet Take by mouth.    . polyethylene glycol (MIRALAX / GLYCOLAX) 17 g packet Take 17 g by mouth daily. W/coffee    . Probiotic Product (ALIGN) 4 MG CAPS Take 4 mg by mouth daily.     . rosuvastatin (CRESTOR) 20 MG tablet Take 20 mg by mouth daily. (Patient not taking: Reported on 01/28/2021)    . valsartan (DIOVAN) 320 MG tablet Take 320 mg by mouth daily.    . cephALEXin (KEFLEX) 500 MG capsule Take 500 mg by mouth 2 (two) times daily.    . furosemide (LASIX) 40 MG tablet Take 40 mg by mouth daily.    . Trospium Chloride 60 MG CP24 Take by mouth.    . venlafaxine (EFFEXOR) 25 MG tablet TAKE 1 TABLET (25 MG TOTAL) BY MOUTH DAILY. (Patient not taking: Reported on 12/31/2020) 90 tablet 1   No current facility-administered medications on file prior to visit.    There are no Patient Instructions on file for this visit. No follow-ups on file.   Kris Hartmann, NP

## 2021-01-31 ENCOUNTER — Telehealth: Payer: Self-pay | Admitting: Pharmacy Technician

## 2021-01-31 NOTE — Telephone Encounter (Signed)
Oral Oncology Patient Advocate Encounter  Received fax notification from [facility] FEP that the existing prior authorization for Zytiga (Abiraterone) is due for renewal.  Faxed completed PA questionnaire to [phone]. Status is pending  Oral Oncology Clinic will continue to follow.  [place] Patient [place] Phone [phone] Fax [phone] 01/31/2021 10:05 AM

## 2021-02-01 ENCOUNTER — Telehealth: Payer: Self-pay | Admitting: Oncology

## 2021-02-01 NOTE — Telephone Encounter (Signed)
Left VM to contact office to schedule Venofer weekly x 2 per patient message from [facility].

## 2021-02-01 NOTE — Telephone Encounter (Signed)
Re: Iron Infusion   Called to follow-up on lab work and iron infusions.  Patient saw PCP yesterday who has addressed his hyperglycemia and started him on glipizide.  He also recommended stool cards and referral to GI.  In the interim, I recommend he continue with the 2 doses of IV iron as previously discussed.  She is in agreement.  Patient's wife, Santiago Glad is to call [place] to get this scheduled ASAP.  Faythe Casa, NP 02/01/2021 9:07 AM

## 2021-02-02 ENCOUNTER — Encounter (INDEPENDENT_AMBULATORY_CARE_PROVIDER_SITE_OTHER): Payer: Self-pay

## 2021-02-02 ENCOUNTER — Other Ambulatory Visit
Admission: RE | Admit: 2021-02-02 | Discharge: 2021-02-02 | Disposition: A | Discharge status: Home / Self Care | Payer: Medicare Other | Source: Ambulatory Visit | Attending: Gastroenterology | Admitting: Gastroenterology

## 2021-02-02 ENCOUNTER — Other Ambulatory Visit: Payer: Self-pay

## 2021-02-02 DIAGNOSIS — Z20822 Contact with and (suspected) exposure to covid-19: Secondary | ICD-10-CM | POA: Insufficient documentation

## 2021-02-02 DIAGNOSIS — Z01812 Encounter for preprocedural laboratory examination: Secondary | ICD-10-CM | POA: Diagnosis present

## 2021-02-02 LAB — SARS CORONAVIRUS 2 (TAT 6-24 HRS): SARS Coronavirus 2: NEGATIVE

## 2021-02-02 NOTE — Telephone Encounter (Signed)
Oral Oncology Patient Advocate Encounter  Prior Authorization for Zytiga (Abiraterone) has been approved.    Effective dates: 01/02/21 through 02/01/22  Oral Oncology Clinic will continue to follow.   [place] Patient [place] Phone [phone] Fax [phone] 02/02/2021 10:00 AM

## 2021-02-03 ENCOUNTER — Encounter: Payer: Self-pay | Admitting: *Deleted

## 2021-02-04 ENCOUNTER — Encounter
Admission: RE | Disposition: A | Discharge status: Home / Self Care | Payer: Self-pay | Source: Home / Self Care | Attending: Gastroenterology

## 2021-02-04 ENCOUNTER — Ambulatory Visit: Payer: Medicare Other | Admitting: Certified Registered Nurse Anesthetist

## 2021-02-04 ENCOUNTER — Ambulatory Visit
Admission: RE | Admit: 2021-02-04 | Discharge: 2021-02-04 | Disposition: A | Discharge status: Home / Self Care | Payer: Medicare Other | Attending: Gastroenterology | Admitting: Gastroenterology

## 2021-02-04 ENCOUNTER — Ambulatory Visit: Payer: Medicare Other

## 2021-02-04 ENCOUNTER — Encounter: Payer: Self-pay | Admitting: *Deleted

## 2021-02-04 ENCOUNTER — Other Ambulatory Visit: Payer: Self-pay

## 2021-02-04 DIAGNOSIS — C61 Malignant neoplasm of prostate: Secondary | ICD-10-CM | POA: Diagnosis not present

## 2021-02-04 DIAGNOSIS — D509 Iron deficiency anemia, unspecified: Secondary | ICD-10-CM | POA: Diagnosis present

## 2021-02-04 DIAGNOSIS — K5730 Diverticulosis of large intestine without perforation or abscess without bleeding: Secondary | ICD-10-CM | POA: Diagnosis not present

## 2021-02-04 DIAGNOSIS — Z79899 Other long term (current) drug therapy: Secondary | ICD-10-CM | POA: Diagnosis not present

## 2021-02-04 DIAGNOSIS — Z79890 Hormone replacement therapy (postmenopausal): Secondary | ICD-10-CM | POA: Diagnosis not present

## 2021-02-04 DIAGNOSIS — Z87891 Personal history of nicotine dependence: Secondary | ICD-10-CM | POA: Insufficient documentation

## 2021-02-04 DIAGNOSIS — Z888 Allergy status to other drugs, medicaments and biological substances status: Secondary | ICD-10-CM | POA: Diagnosis not present

## 2021-02-04 DIAGNOSIS — K635 Polyp of colon: Secondary | ICD-10-CM | POA: Insufficient documentation

## 2021-02-04 HISTORY — PX: COLONOSCOPY WITH PROPOFOL: SHX5780

## 2021-02-04 HISTORY — DX: Unspecified thoracic, thoracolumbar and lumbosacral intervertebral disc disorder: M51.9

## 2021-02-04 HISTORY — DX: Type 2 diabetes mellitus without complications: E11.9

## 2021-02-04 HISTORY — DX: Hypothyroidism, unspecified: E03.9

## 2021-02-04 HISTORY — PX: ESOPHAGOGASTRODUODENOSCOPY (EGD) WITH PROPOFOL: SHX5813

## 2021-02-04 HISTORY — DX: Personal history of other benign neoplasm: Z86.018

## 2021-02-04 HISTORY — DX: Cerebral infarction, unspecified: I63.9

## 2021-02-04 HISTORY — DX: Testicular hypofunction: E29.1

## 2021-02-04 HISTORY — DX: Other cervical disc degeneration, unspecified cervical region: M50.30

## 2021-02-04 HISTORY — DX: Iron deficiency anemia, unspecified: D50.9

## 2021-02-04 HISTORY — DX: Atherosclerosis of aorta: I70.0

## 2021-02-04 HISTORY — DX: Unspecified atrial fibrillation: I48.91

## 2021-02-04 HISTORY — DX: Malignant neoplasm of prostate: C61

## 2021-02-04 HISTORY — DX: Hypopituitarism: E23.0

## 2021-02-04 HISTORY — DX: Calculus of kidney: N20.0

## 2021-02-04 HISTORY — DX: Diverticulosis of intestine, part unspecified, without perforation or abscess without bleeding: K57.90

## 2021-02-04 LAB — CBC
HCT: 31.2 % — ABNORMAL LOW (ref 39.0–52.0)
Hemoglobin: 9.1 g/dL — ABNORMAL LOW (ref 13.0–17.0)
MCH: 29.2 pg (ref 26.0–34.0)
MCHC: 29.2 g/dL — ABNORMAL LOW (ref 30.0–36.0)
MCV: 100 fL (ref 80.0–100.0)
Platelets: 245 10*3/uL (ref 150–400)
RBC: 3.12 MIL/uL — ABNORMAL LOW (ref 4.22–5.81)
RDW: 16.3 % — ABNORMAL HIGH (ref 11.5–15.5)
WBC: 6 10*3/uL (ref 4.0–10.5)
nRBC: 0 % (ref 0.0–0.2)

## 2021-02-04 LAB — GLUCOSE, CAPILLARY: Glucose-Capillary: 140 mg/dL — ABNORMAL HIGH (ref 70–99)

## 2021-02-04 SURGERY — COLONOSCOPY WITH PROPOFOL
Anesthesia: General

## 2021-02-04 MED ORDER — PROPOFOL 10 MG/ML IV BOLUS
INTRAVENOUS | Status: DC | PRN
  Administered 2021-02-04: 50 mg via INTRAVENOUS

## 2021-02-04 MED ORDER — PROPOFOL 500 MG/50ML IV EMUL
INTRAVENOUS | Status: DC | PRN
  Administered 2021-02-04: 120 ug/kg/min via INTRAVENOUS

## 2021-02-04 MED ORDER — HYDROCORTISONE NA SUCCINATE PF 100 MG IJ SOLR
INTRAMUSCULAR | Status: DC | PRN
  Administered 2021-02-04: 100 mg via INTRAVENOUS

## 2021-02-04 MED ORDER — PROPOFOL 500 MG/50ML IV EMUL
INTRAVENOUS | Status: AC
  Filled 2021-02-04: qty 100

## 2021-02-04 MED ORDER — EPHEDRINE SULFATE 50 MG/ML IJ SOLN
INTRAMUSCULAR | Status: DC | PRN
  Administered 2021-02-04 (×3): 5 mg via INTRAVENOUS

## 2021-02-04 MED ORDER — SODIUM CHLORIDE 0.9 % IV SOLN
INTRAVENOUS | Status: DC
  Administered 2021-02-04: 1000 mL via INTRAVENOUS

## 2021-02-04 MED ORDER — LIDOCAINE HCL (CARDIAC) PF 100 MG/5ML IV SOSY
PREFILLED_SYRINGE | INTRAVENOUS | Status: DC | PRN
Start: 2021-02-04 — End: 2021-02-04
  Administered 2021-02-04: 100 mg via INTRAVENOUS

## 2021-02-04 MED ORDER — HYDROCORTISONE NA SUCCINATE PF 100 MG IJ SOLR
INTRAMUSCULAR | Status: AC
  Filled 2021-02-04: qty 2

## 2021-02-04 NOTE — Interval H&P Note (Signed)
History and Physical Interval Note:  02/04/2021 12:38 PM  Andrew Callahan  has presented today for surgery, with the diagnosis of IDA.  The various methods of treatment have been discussed with the patient and family. After consideration of risks, benefits and other options for treatment, the patient has consented to  Procedure(s) with comments: COLONOSCOPY WITH PROPOFOL (N/A) - STAT CBC BEFORE PROCEDURE ESOPHAGOGASTRODUODENOSCOPY (EGD) WITH PROPOFOL (N/A) as a surgical intervention.  The patient's history has been reviewed, patient examined, no change in status, stable for surgery.  I have reviewed the patient's chart and labs.  Questions were answered to the patient's satisfaction.     Lesly Rubenstein  Ok to proceed with EGD/Colonoscopy

## 2021-02-04 NOTE — H&P (Signed)
Outpatient short stay form Pre-procedure 02/04/2021 12:34 PM Andrew Miyamoto MD, MPH  Primary Physician: Dr. Sabra Heck  Reason for visit:  IDA  History of present illness:   87 y/o gentleman with metastatic prostate cancer here for EGD/Colonoscopy for IDA. Had colonoscopy with polyps in 2019. One of the medicines he takes for prostate cancer can cause anemia. Denies abdominal surgeries although in the EMR a hernia repair is listed. Takes eliquis with last dose being > 2 days ago.    Current Facility-Administered Medications:  .  0.9 %  sodium chloride infusion, , Intravenous, Continuous, Locklear, Hilton Cork, MD  Medications Prior to Admission  Medication Sig Dispense Refill Last Dose  . Calcium-Magnesium-Vitamin D (CALCIUM 1200+D3 PO) Take 2 tablets by mouth daily.   02/03/2021 at Unknown time  . cetirizine (ZYRTEC) 10 MG tablet Take 10 mg by mouth at bedtime.   Past Week at Unknown time  . ELIQUIS 5 MG TABS tablet Take 5 mg by mouth 2 (two) times daily.   Past Week at Unknown time  . hydrocortisone (CORTEF) 10 MG tablet Take 10 mg by mouth 2 (two) times daily.    02/03/2021 at Unknown time  . Iron, Ferrous Sulfate, 325 (65 Fe) MG TABS    Past Week at Unknown time  . levothyroxine (SYNTHROID) 50 MCG tablet Take 50 mcg by mouth daily before breakfast.   02/04/2021 at 0635  . pantoprazole (PROTONIX) 40 MG tablet Take by mouth.   02/03/2021 at Unknown time  . polyethylene glycol (MIRALAX / GLYCOLAX) 17 g packet Take 17 g by mouth daily. W/coffee   02/03/2021 at Unknown time  . Probiotic Product (ALIGN) 4 MG CAPS Take 4 mg by mouth daily.    02/03/2021 at Unknown time  . traZODone (DESYREL) 50 MG tablet Take by mouth.   02/03/2021 at Unknown time  . Trospium Chloride 60 MG CP24 Take by mouth.   02/03/2021 at Unknown time  . abiraterone acetate (ZYTIGA) 250 MG tablet Take 4 tablets (1,000 mg total) by mouth daily. Take on an empty stomach 1 hour before or 2 hours after a meal 120 tablet 2   . B  Complex-C (SUPER B COMPLEX PO) Take 1 capsule by mouth daily.      . cephALEXin (KEFLEX) 500 MG capsule Take 500 mg by mouth 2 (two) times daily.     Marland Kitchen denosumab (XGEVA) 120 MG/1.7ML SOLN injection Inject 120 mg into the skin every 28 (twenty-eight) days.      . furosemide (LASIX) 40 MG tablet Take 40 mg by mouth daily.     . hydrochlorothiazide (HYDRODIURIL) 25 MG tablet Take by mouth. (Patient not taking: Reported on 01/28/2021)     . Leuprolide Acetate (ELIGARD [place]) Inject into the skin.     . Misc Natural Products (OSTEO BI-FLEX ADV JOINT SHIELD PO) Take 1 tablet by mouth daily.      . Multiple Vitamin (MULTIVITAMIN WITH MINERALS) TABS tablet Take 1 tablet by mouth daily. One-A-Day Men's 26+     . rosuvastatin (CRESTOR) 20 MG tablet Take 20 mg by mouth daily. (Patient not taking: Reported on 01/28/2021)     . valsartan (DIOVAN) 320 MG tablet Take 320 mg by mouth daily. (Patient not taking: Reported on 02/04/2021)   Completed Course at Unknown time  . venlafaxine (EFFEXOR) 25 MG tablet TAKE 1 TABLET (25 MG TOTAL) BY MOUTH DAILY. (Patient not taking: Reported on 12/31/2020) 90 tablet 1      Allergies  Allergen Reactions  .  Metoprolol Other (See Comments)    dizziness   . Rivaroxaban Other (See Comments)    hematuria      Past Medical History:  Diagnosis Date  . Acquired hypothyroidism   . Aortic atherosclerosis ([place])   . Atrial fibrillation ([place])   . Chronic constipation   . CVA (cerebral vascular accident) ([place])   . DDD (degenerative disc disease), cervical   . Diabetes mellitus without complication ([place])   . Diverticulosis   . Elevated prostate specific antigen (PSA)   . Family history of leukemia   . Family history of lung cancer   . History of kidney stones   . History of pituitary adenoma   . Hyperlipidemia   . Hypertension   . Hypogonadism in male   . Hypothyroid   . Hypothyroidism   . IDA (iron deficiency anemia)   . Lumbar disc disease   . Nephrolithiasis   .  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 Paroxysmal A-fib ([place])   . Prostate cancer ([place])   . Sinus bradycardia   . Sleep apnea    uses CPAP  . Squamous cell carcinoma of skin 2014   Right hand. Mohs.  . Stroke ([place])    throat / slight difficulty swallowing    Review of systems:  Otherwise negative.    Physical Exam  Gen: Alert, oriented. Appears stated age.  HEENT: PERRLA. Lungs: No respiratory distress CV: RRR Abd: soft, benign, no masses Ext: No edema    Planned procedures: Proceed with EGD/colonoscopy. The patient understands the nature of the planned procedure, indications, risks, alternatives and potential complications including but not limited to bleeding, infection, perforation, damage to internal organs and possible oversedation/side effects from anesthesia. The patient agrees and gives consent to proceed.  Please refer to procedure notes for findings, recommendations and patient disposition/instructions.     Andrew Miyamoto MD, MPH Gastroenterology 02/04/2021  12:34 PM

## 2021-02-04 NOTE — Op Note (Signed)
[facility] Gastroenterology Patient Name: Zebulin Siegel Procedure Date: 02/04/2021 12:59 PM MRN: [id number] Account #: [id number] Date of Birth: 1934/11/27 Admit Type: Outpatient Age: 87 Room: [facility] ENDO ROOM 3 Gender: Male Note Status: Finalized Procedure:             Colonoscopy Indications:           Iron deficiency anemia Providers:             Andrey Farmer MD, MD Medicines:             Monitored Anesthesia Care Complications:         No immediate complications. Estimated blood loss:                         Minimal. Procedure:             Pre-Anesthesia Assessment:                        - Prior to the procedure, a History and Physical was                         performed, and patient medications and allergies were                         reviewed. The patient is competent. The risks and                         benefits of the procedure and the sedation options and                         risks were discussed with the patient. All questions                         were answered and informed consent was obtained.                         Patient identification and proposed procedure were                         verified by the physician, the nurse, the anesthetist                         and the technician in the endoscopy suite. Mental                         Status Examination: alert and oriented. Airway                         Examination: normal oropharyngeal airway and neck                         mobility. Respiratory Examination: clear to                         auscultation. CV Examination: normal. Prophylactic                         Antibiotics: The patient does not require prophylactic  antibiotics. Prior Anticoagulants: The patient has                         taken Eliquis (apixaban), last dose was 2 days prior                         to procedure. ASA Grade Assessment: III - A patient                         with severe  systemic disease. After reviewing the                         risks and benefits, the patient was deemed in                         satisfactory condition to undergo the procedure. The                         anesthesia plan was to use monitored anesthesia care                         (MAC). Immediately prior to administration of                         medications, the patient was re-assessed for adequacy                         to receive sedatives. The heart rate, respiratory                         rate, oxygen saturations, blood pressure, adequacy of                         pulmonary ventilation, and response to care were                         monitored throughout the procedure. The physical                         status of the patient was re-assessed after the                         procedure.                        After obtaining informed consent, the colonoscope was                         passed under direct vision. Throughout the procedure,                         the patient's blood pressure, pulse, and oxygen                         saturations were monitored continuously. The                         Colonoscope was introduced through the anus and  advanced to the the terminal ileum. The colonoscopy                         was performed without difficulty. The patient                         tolerated the procedure well. The quality of the bowel                         preparation was good. Findings:      The perianal and digital rectal examinations were normal.      The terminal ileum appeared normal.      A 3 mm polyp was found in the ascending colon. The polyp was sessile.       The polyp was removed with a cold snare. Resection was complete, but the       polyp tissue was not retrieved. Estimated blood loss was minimal.      Multiple small-mouthed diverticula were found in the sigmoid colon. Impression:            - The examined portion of the  ileum was normal.                        - One 3 mm polyp in the ascending colon, removed with                         a cold snare. Complete resection. Polyp tissue not                         retrieved.                        - Diverticulosis in the sigmoid colon. Recommendation:        - Discharge patient to home.                        - Resume previous diet.                        - Resume Eliquis (apixaban) at prior dose tomorrow.                        - Repeat colonoscopy is not recommended due to current                         age (39 years or older) for surveillance.                        - Return to referring physician as previously                         scheduled. Procedure Code(s):     --- Professional ---                        [phone], Colonoscopy, flexible; with removal of                         tumor(s), polyp(s), or other lesion(s) by snare  technique Diagnosis Code(s):     --- Professional ---                        K63.5, Polyp of colon                        D50.9, Iron deficiency anemia, unspecified                        K57.30, Diverticulosis of large intestine without                         perforation or abscess without bleeding CPT copyright 2019 American Medical Association. All rights reserved. The codes documented in this report are preliminary and upon coder review may  be revised to meet current compliance requirements. Andrey Farmer MD, MD 02/04/2021 1:47:28 PM Number of Addenda: 0 Note Initiated On: 02/04/2021 12:59 PM Scope Withdrawal Time: 0 hours 10 minutes 51 seconds  Total Procedure Duration: 0 hours 21 minutes 2 seconds  Estimated Blood Loss:  Estimated blood loss was minimal.      [facility]

## 2021-02-04 NOTE — Anesthesia Preprocedure Evaluation (Addendum)
Anesthesia Evaluation  Patient identified by MRN, date of birth, ID band Patient awake    Reviewed: Allergy & Precautions, H&P , NPO status , reviewed documented beta blocker date and time   History of Anesthesia Complications Negative for: history of anesthetic complications  Airway Mallampati: II  TM Distance: >3 FB Neck ROM: limited    Dental  (+) Caps, Chipped   Pulmonary neg shortness of breath, sleep apnea and Continuous Positive Airway Pressure Ventilation , neg COPD, neg recent URI, former smoker,    Pulmonary exam normal        Cardiovascular hypertension, (-) angina+ CAD  (-) Past MI and (-) Cardiac Stents (-) dysrhythmias (-) Valvular Problems/Murmurs Rhythm:irregular     Neuro/Psych neg Seizures CVA, Residual Symptoms    GI/Hepatic Neg liver ROS, neg GERD  ,  Endo/Other  diabetesHypothyroidism   Renal/GU Renal disease (kidney stones)     Musculoskeletal   Abdominal   Peds  Hematology   Anesthesia Other Findings Past Medical History:  1.  Atrial fibrillation. 2.  Hypertension. 3.  Hypercholesterolemia. 4.  Panhypopituitarism.  5.  Hypothyroidism. 6.  History of stroke June 2020 which left him with dysarthria and difficulty with handwriting and balance issues. 7.  Sleep apnea. No date: Chronic constipation No date: Elevated prostate specific antigen (PSA) No date: History of kidney stones No date: Hyperlipidemia No date: Hypertension No date: Hypothyroid No date: Hypothyroidism No date: Paroxysmal A-fib (HCC) No date: Sinus bradycardia No date: Sleep apnea     Comment:  uses CPAP No date: Stroke [facility]) Past Surgical History: 1997: BACK SURGERY     Comment:  L3, 4, 5 laminectomy  1998: BACK SURGERY     Comment:  hard wire removed, fusion of L3-5 2009: BACK SURGERY     Comment:  laminotomy/foraminotomy w/ decompression of nerve root,               facet thermal ablation L2-3.  2010: [place] 2007: KNEE SURGERY; Left     Comment:  Arthroscopic partial knee replacement, replaced medial               side  2014: MOHS SURGERY; Right     Comment:  squamous cell carcinoma 2008: NECK SURGERY     Comment:  fusion with hardwire in place C3 through C7  1994: pituitary adenoma     Comment:  benign No date: SPINE SURGERY 2016: SPINE SURGERY     Comment:  fusion L4    Reproductive/Obstetrics negative OB ROS                             Anesthesia Physical  Anesthesia Plan  ASA: III  Anesthesia Plan: General   Post-op Pain Management:    Induction: Intravenous  PONV Risk Score and Plan:   Airway Management Planned: Nasal Cannula  Additional Equipment:   Intra-op Plan:   Post-operative Plan:   Informed Consent: I have reviewed the patients History and Physical, chart, labs and discussed the procedure including the risks, benefits and alternatives for the proposed anesthesia with the patient or authorized representative who has indicated his/her understanding and acceptance.     Dental Advisory Given  Plan Discussed with: CRNA and Surgeon  Anesthesia Plan Comments:         Anesthesia Quick Evaluation

## 2021-02-04 NOTE — Transfer of Care (Signed)
Immediate Anesthesia Transfer of Care Note  Patient: Andrew Callahan  Procedure(s) Performed: COLONOSCOPY WITH PROPOFOL (N/A ) ESOPHAGOGASTRODUODENOSCOPY (EGD) WITH PROPOFOL (N/A )  Patient Location: PACU  Anesthesia Type:General  Level of Consciousness: awake and alert   Airway & Oxygen Therapy: Patient Spontanous Breathing and Patient connected to nasal cannula oxygen  Post-op Assessment: Report given to RN and Post -op Vital signs reviewed and stable  Post vital signs: Reviewed and stable  Last Vitals:  Vitals Value Taken Time  BP 134/56   Temp    Pulse 77 02/04/21 1345  Resp 14 02/04/21 1345  SpO2 100 % 02/04/21 1345  Vitals shown include unvalidated device data.  Last Pain:  Vitals:   02/04/21 1344  TempSrc: Temporal  PainSc: Asleep      Patients Stated Pain Goal: 0 (64/84/72 0721)  Complications: No complications documented.

## 2021-02-04 NOTE — Op Note (Signed)
[facility] Gastroenterology Patient Name: Andrew Callahan Procedure Date: 02/04/2021 12:59 PM MRN: [id number] Account #: [id number] Date of Birth: November 08, 1934 Admit Type: Outpatient Age: 87 Room: [facility] ENDO ROOM 3 Gender: Male Note Status: Finalized Procedure:             Upper GI endoscopy Indications:           Iron deficiency anemia Providers:             Andrey Farmer MD, MD Medicines:             Monitored Anesthesia Care Complications:         No immediate complications. Estimated blood loss:                         Minimal. Procedure:             Pre-Anesthesia Assessment:                        - Prior to the procedure, a History and Physical was                         performed, and patient medications and allergies were                         reviewed. The patient is competent. The risks and                         benefits of the procedure and the sedation options and                         risks were discussed with the patient. All questions                         were answered and informed consent was obtained.                         Patient identification and proposed procedure were                         verified by the physician, the nurse, the anesthetist                         and the technician in the endoscopy suite. Mental                         Status Examination: alert and oriented. Airway                         Examination: normal oropharyngeal airway and neck                         mobility. Respiratory Examination: clear to                         auscultation. CV Examination: normal. Prophylactic                         Antibiotics: The patient does not require prophylactic  antibiotics. Prior Anticoagulants: The patient has                         taken Eliquis (apixaban), last dose was 2 days prior                         to procedure. ASA Grade Assessment: III - A patient                         with  severe systemic disease. After reviewing the                         risks and benefits, the patient was deemed in                         satisfactory condition to undergo the procedure. The                         anesthesia plan was to use monitored anesthesia care                         (MAC). Immediately prior to administration of                         medications, the patient was re-assessed for adequacy                         to receive sedatives. The heart rate, respiratory                         rate, oxygen saturations, blood pressure, adequacy of                         pulmonary ventilation, and response to care were                         monitored throughout the procedure. The physical                         status of the patient was re-assessed after the                         procedure.                        After obtaining informed consent, the endoscope was                         passed under direct vision. Throughout the procedure,                         the patient's blood pressure, pulse, and oxygen                         saturations were monitored continuously. The Endoscope                         was introduced through the mouth, and advanced to the  second part of duodenum. The upper GI endoscopy was                         accomplished without difficulty. The patient tolerated                         the procedure well. Findings:      The examined esophagus was normal.      The entire examined stomach was normal. Biopsies were taken with a cold       forceps for Helicobacter pylori testing. Estimated blood loss was       minimal.      The examined duodenum was normal. Impression:            - Normal esophagus.                        - Normal stomach. Biopsied.                        - Normal examined duodenum. Recommendation:        - Discharge patient to home.                        - Perform a colonoscopy today.                         - Await pathology results. Procedure Code(s):     --- Professional ---                        [phone], Esophagogastroduodenoscopy, flexible,                         transoral; with biopsy, single or multiple Diagnosis Code(s):     --- Professional ---                        D50.9, Iron deficiency anemia, unspecified CPT copyright 2019 American Medical Association. All rights reserved. The codes documented in this report are preliminary and upon coder review may  be revised to meet current compliance requirements. Andrey Farmer MD, MD 02/04/2021 1:44:11 PM Number of Addenda: 0 Note Initiated On: 02/04/2021 12:59 PM Estimated Blood Loss:  Estimated blood loss was minimal.      [facility]

## 2021-02-04 NOTE — Anesthesia Procedure Notes (Signed)
Performed by: Demetrius Charity, CRNA Oxygen Delivery Method: Supernova nasal CPAP

## 2021-02-06 NOTE — Anesthesia Postprocedure Evaluation (Signed)
Anesthesia Post Note  Patient: Glenford Garis  Procedure(s) Performed: COLONOSCOPY WITH PROPOFOL (N/A ) ESOPHAGOGASTRODUODENOSCOPY (EGD) WITH PROPOFOL (N/A )  Patient location during evaluation: Endoscopy Anesthesia Type: General Level of consciousness: awake and alert and oriented Pain management: pain level controlled Vital Signs Assessment: post-procedure vital signs reviewed and stable Respiratory status: spontaneous breathing Cardiovascular status: blood pressure returned to baseline Anesthetic complications: no   No complications documented.   Last Vitals:  Vitals:   02/04/21 1404 02/04/21 1414  BP: (!) 165/83 (!) 177/90  Pulse: 73 76  Resp: 16 17  Temp:    SpO2: 100% 100%    Last Pain:  Vitals:   02/05/21 1137  TempSrc:   PainSc: 0-No pain                 CARROLL,PAUL

## 2021-02-07 ENCOUNTER — Encounter: Payer: Self-pay | Admitting: Gastroenterology

## 2021-02-08 LAB — SURGICAL PATHOLOGY

## 2021-02-09 ENCOUNTER — Inpatient Hospital Stay: Payer: Medicare Other | Attending: Oncology

## 2021-02-09 ENCOUNTER — Other Ambulatory Visit: Payer: Self-pay | Admitting: Oncology

## 2021-02-09 ENCOUNTER — Inpatient Hospital Stay: Payer: Medicare Other

## 2021-02-09 VITALS — BP 149/66 | HR 60 | Temp 96.2°F | Resp 16

## 2021-02-09 DIAGNOSIS — Z87891 Personal history of nicotine dependence: Secondary | ICD-10-CM | POA: Diagnosis not present

## 2021-02-09 DIAGNOSIS — R5383 Other fatigue: Secondary | ICD-10-CM | POA: Insufficient documentation

## 2021-02-09 DIAGNOSIS — R600 Localized edema: Secondary | ICD-10-CM | POA: Diagnosis not present

## 2021-02-09 DIAGNOSIS — C7951 Secondary malignant neoplasm of bone: Secondary | ICD-10-CM | POA: Diagnosis present

## 2021-02-09 DIAGNOSIS — Z806 Family history of leukemia: Secondary | ICD-10-CM | POA: Diagnosis not present

## 2021-02-09 DIAGNOSIS — Z79899 Other long term (current) drug therapy: Secondary | ICD-10-CM | POA: Diagnosis not present

## 2021-02-09 DIAGNOSIS — R351 Nocturia: Secondary | ICD-10-CM | POA: Insufficient documentation

## 2021-02-09 DIAGNOSIS — R319 Hematuria, unspecified: Secondary | ICD-10-CM | POA: Diagnosis not present

## 2021-02-09 DIAGNOSIS — Z96652 Presence of left artificial knee joint: Secondary | ICD-10-CM | POA: Insufficient documentation

## 2021-02-09 DIAGNOSIS — Z801 Family history of malignant neoplasm of trachea, bronchus and lung: Secondary | ICD-10-CM | POA: Insufficient documentation

## 2021-02-09 DIAGNOSIS — R531 Weakness: Secondary | ICD-10-CM | POA: Insufficient documentation

## 2021-02-09 DIAGNOSIS — D509 Iron deficiency anemia, unspecified: Secondary | ICD-10-CM | POA: Diagnosis not present

## 2021-02-09 DIAGNOSIS — C61 Malignant neoplasm of prostate: Secondary | ICD-10-CM | POA: Diagnosis present

## 2021-02-09 MED ORDER — SODIUM CHLORIDE 0.9 % IV SOLN
510.0000 mg | Freq: Once | INTRAVENOUS | Status: AC
  Administered 2021-02-09: 510 mg via INTRAVENOUS
  Filled 2021-02-09: qty 17

## 2021-02-10 ENCOUNTER — Ambulatory Visit: Payer: Medicare Other | Admitting: Urology

## 2021-02-11 ENCOUNTER — Ambulatory Visit: Payer: Medicare Other

## 2021-02-15 ENCOUNTER — Inpatient Hospital Stay: Payer: Medicare Other

## 2021-02-16 ENCOUNTER — Encounter: Payer: Self-pay | Admitting: Urology

## 2021-02-16 ENCOUNTER — Ambulatory Visit (INDEPENDENT_AMBULATORY_CARE_PROVIDER_SITE_OTHER): Payer: Medicare Other | Admitting: Urology

## 2021-02-16 ENCOUNTER — Other Ambulatory Visit: Payer: Self-pay

## 2021-02-16 VITALS — BP 123/75 | HR 66 | Ht 70.0 in

## 2021-02-16 DIAGNOSIS — C61 Malignant neoplasm of prostate: Secondary | ICD-10-CM | POA: Diagnosis not present

## 2021-02-16 DIAGNOSIS — R351 Nocturia: Secondary | ICD-10-CM | POA: Diagnosis not present

## 2021-02-16 DIAGNOSIS — N401 Enlarged prostate with lower urinary tract symptoms: Secondary | ICD-10-CM | POA: Diagnosis not present

## 2021-02-16 DIAGNOSIS — N138 Other obstructive and reflux uropathy: Secondary | ICD-10-CM | POA: Diagnosis not present

## 2021-02-16 DIAGNOSIS — N390 Urinary tract infection, site not specified: Secondary | ICD-10-CM | POA: Diagnosis not present

## 2021-02-16 LAB — BLADDER SCAN AMB NON-IMAGING

## 2021-02-16 MED ORDER — SULFAMETHOXAZOLE-TRIMETHOPRIM 800-160 MG PO TABS: 1.0000 | ORAL_TABLET | Freq: Two times a day (BID) | ORAL | 0 refills | Status: DC

## 2021-02-16 NOTE — Progress Notes (Signed)
   02/16/2021 12:53 PM   Andrew Callahan Dec 02, 1934 [id number]  Reason for visit: Metastatic prostate cancer, urinary frequency/urgency/nocturia  HPI: 87 year old male with history of stroke and metastatic prostate cancer, as well as urinary retention and elevated PVRs.  He underwent an uncomplicated HOLEP on 07/11/8402.  He has been voiding with a very strong stream since surgery, but continues to have persistent bothersome overactive symptoms including urgency, frequency, and nocturia.  He is compliant with his sleep apnea machine.  His metastatic prostate cancer is managed by oncology.  We did a cystoscopy in November 2021 that showed expected postop HOLEP findings of a wide open prostatic fossa, and verumontanum and the sphincter were intact, and bladder was normal-appearing.  He is not currently on any bladder medications.  He did not notice any improvement on Myrbetriq previously, and most recently no significant improvement on trospium.  He completed a voiding diary recently, and is voiding 5-7 times during the day, and 3-4 times overnight.  He is not having much of a problem with incontinence at this point, is primarily nocturia.  He also was recently started on furosemide for significant edema, which has obviously accentuated his urinary frequency.  He denies any dysuria.  Urinalysis today is grossly concerning for infection with 11-30 WBCs, 0-2 RBCs, many bacteria, nitrite positive, 1+ leukocytes.  PVR is normal at 67 mL.  I do long conversation with the patient and his partner today.  We discussed that urinary frequency 5-7 times during the day is normal for many patients, especially those with metastatic prostate cancer in their 80s.  I think we should focus on reducing his nocturia.  We also discussed his suspected UTI today, which may be contributing to his symptoms.  I sent in Bactrim DS twice daily x2 weeks, and will follow up the culture results.  Could consider PTNS in the future, or  even a low-dose desmopressin to see if we can improve his quality of life overnight.  Virtual follow-up 2-3 weeks symptom check  [name], MD 02/16/2021    Billey Co, MD  [facility] [address], [place], [place] [address] [phone]

## 2021-02-17 ENCOUNTER — Ambulatory Visit: Payer: Medicare Other

## 2021-02-17 LAB — MICROSCOPIC EXAMINATION

## 2021-02-17 LAB — URINALYSIS, COMPLETE
Bilirubin, UA: NEGATIVE
Glucose, UA: NEGATIVE
Ketones, UA: NEGATIVE
Nitrite, UA: POSITIVE — AB
Protein,UA: NEGATIVE
RBC, UA: NEGATIVE
Specific Gravity, UA: 1.01 (ref 1.005–1.030)
Urobilinogen, Ur: 0.2 mg/dL (ref 0.2–1.0)
pH, UA: 6 (ref 5.0–7.5)

## 2021-02-19 LAB — CULTURE, URINE COMPREHENSIVE

## 2021-02-22 LAB — MYCOPLASMA / UREAPLASMA CULTURE
Mycoplasma hominis Culture: NEGATIVE
Ureaplasma urealyticum: NEGATIVE

## 2021-02-23 ENCOUNTER — Other Ambulatory Visit: Payer: Self-pay | Admitting: Urology

## 2021-02-24 ENCOUNTER — Inpatient Hospital Stay: Payer: Medicare Other

## 2021-02-24 ENCOUNTER — Telehealth: Payer: Self-pay | Admitting: *Deleted

## 2021-02-24 NOTE — Telephone Encounter (Signed)
Santiago Glad (caregiver) called. Patient unable to make apts for fereheme today due to fatigue. Caregiver requested to r/s tomorrow at 2:15 pm. apts adjusted per pt/family request.

## 2021-02-25 ENCOUNTER — Inpatient Hospital Stay: Payer: Medicare Other

## 2021-02-25 ENCOUNTER — Other Ambulatory Visit: Payer: Self-pay

## 2021-02-25 VITALS — BP 135/69

## 2021-02-25 DIAGNOSIS — C61 Malignant neoplasm of prostate: Secondary | ICD-10-CM

## 2021-02-25 MED ORDER — SODIUM CHLORIDE 0.9 % IV SOLN
INTRAVENOUS | Status: DC
  Filled 2021-02-25: qty 250

## 2021-02-25 MED ORDER — SODIUM CHLORIDE 0.9 % IV SOLN
510.0000 mg | Freq: Once | INTRAVENOUS | Status: AC
  Administered 2021-02-25: 510 mg via INTRAVENOUS
  Filled 2021-02-25: qty 510

## 2021-02-25 NOTE — Progress Notes (Signed)
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  INTERVAL HISTORY: Patient returns to clinic today for further evaluation and continuation of Zytiga.  He continues to have chronic weakness and fatigue.  He also has chronic frequency and nocturia.  He has no neurologic complaints.  He denies any recent fevers or illnesses.  He has a good appetite and denies weight loss.  He has no chest pain, shortness of breath, cough, or hemoptysis.  He denies any nausea, vomiting, constipation, or diarrhea.  He has no other neurologic complaints.  Patient has no further specific complaints today.  REVIEW OF SYSTEMS:   Review of Systems  Constitutional: Positive for malaise/fatigue. Negative for fever and weight loss.  Respiratory: Negative.  Negative for cough and hemoptysis.   Cardiovascular: Positive for leg swelling. Negative for chest pain.  Gastrointestinal: Negative.  Negative for abdominal pain.  Genitourinary: Positive for frequency. Negative for hematuria.  Musculoskeletal: Negative.  Negative for back pain.  Skin: Negative.  Negative for rash.  Neurological: Positive for weakness. Negative for dizziness, sensory change, focal weakness and headaches.  Psychiatric/Behavioral: Negative.  The patient is not nervous/anxious.     As per HPI. Otherwise, a complete review of systems is negative.  PAST MEDICAL HISTORY: Past Medical History:  Diagnosis Date  . Acquired hypothyroidism   . Aortic atherosclerosis ([place])   . Atrial fibrillation ([place])   . Chronic constipation   . CVA (cerebral vascular accident) ([place])   . DDD (degenerative disc disease), cervical   . Diabetes mellitus  without complication ([place])   . Diverticulosis   . Elevated prostate specific antigen (PSA)   . Family history of leukemia   . Family history of lung cancer   . History of kidney stones   . History of pituitary adenoma   . Hyperlipidemia   . Hypertension   . Hypogonadism in male   . Hypothyroid   . Hypothyroidism   . IDA (iron deficiency anemia)   . Lumbar disc disease   . Nephrolithiasis   . 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 Paroxysmal A-fib ([place])   . Prostate cancer ([place])   . Sinus bradycardia   . Sleep apnea    uses CPAP  . Squamous cell carcinoma of skin 2014   Right hand. Mohs.  . Stroke ([place])    throat / slight difficulty swallowing    PAST SURGICAL HISTORY: Past Surgical History:  Procedure Laterality Date  . BACK SURGERY  1997   L3, 4, 5 laminectomy   . BACK SURGERY  1998   hard wire removed, fusion of L3-5  . BACK SURGERY  2009   laminotomy/foraminotomy w/ decompression of nerve root, facet thermal ablation L2-3.   Marland Kitchen COLONOSCOPY    . COLONOSCOPY WITH PROPOFOL N/A 02/04/2021   Procedure: COLONOSCOPY WITH PROPOFOL;  Surgeon: Lesly Rubenstein, MD;  Location: [facility] ENDOSCOPY;  Service: Endoscopy;  Laterality: N/A;  STAT CBC BEFORE PROCEDURE  . ESOPHAGOGASTRODUODENOSCOPY (EGD) WITH PROPOFOL N/A 02/04/2021   Procedure: ESOPHAGOGASTRODUODENOSCOPY (EGD) WITH PROPOFOL;  Surgeon: Lesly Rubenstein, MD;  Location: [facility] ENDOSCOPY;  Service: Endoscopy;  Laterality: N/A;  . HERNIA REPAIR    . HOLEP-LASER ENUCLEATION OF THE PROSTATE WITH MORCELLATION N/A 06/18/2020  Procedure: HOLEP-LASER ENUCLEATION OF THE PROSTATE WITH MORCELLATION;  Surgeon: Billey Co, MD;  Location: [facility] ORS;  Service: Urology;  Laterality: N/A;  . JOINT REPLACEMENT     left knee partial  . KIDNEY STONE SURGERY  2010  . KNEE SURGERY Left 2007   Arthroscopic partial knee replacement, replaced medial side   . MOHS SURGERY Right 2014   squamous cell  carcinoma  . NECK SURGERY  2008   fusion with hardwire in place C3 through C7   . pituitary adenoma  1994   benign  . PROSTATE BIOPSY N/A 01/01/2020   Procedure: PROSTATE BIOPSY URO NAV FUSION;  Surgeon: Royston Cowper, MD;  Location: [facility] ORS;  Service: Urology;  Laterality: N/A;  . SPINE SURGERY    . SPINE SURGERY  2016   fusion L4   . TONSILLECTOMY      FAMILY HISTORY: Family History  Problem Relation Age of Onset  . Stroke Mother   . Heart attack Father   . Leukemia Sister 79  . Lung cancer Brother 61    ADVANCED DIRECTIVES (Y/N):  N  HEALTH MAINTENANCE: Social History   Tobacco Use  . Smoking status: Former Research scientist (life sciences)  . Smokeless tobacco: Never Used  . Tobacco comment: quit 50 years ago  Vaping Use  . Vaping Use: Never used  Substance Use Topics  . Alcohol use: Yes    Comment: rarely  . Drug use: Never     Colonoscopy:  PAP:  Bone density:  Lipid panel:  Allergies  Allergen Reactions  . Metoprolol Other (See Comments)    dizziness   . Rivaroxaban Other (See Comments)    hematuria     Current Outpatient Medications  Medication Sig Dispense Refill  . abiraterone acetate (ZYTIGA) 250 MG tablet Take 4 tablets (1,000 mg total) by mouth daily. Take on an empty stomach 1 hour before or 2 hours after a meal 120 tablet 2  . ACCU-CHEK GUIDE test strip daily. use as directed    . Accu-Chek Softclix Lancets lancets daily.    . B Complex-C (SUPER B COMPLEX PO) Take 1 capsule by mouth daily.     . Blood Glucose Monitoring Suppl (ACCU-CHEK GUIDE) w/Device KIT See admin instructions.    . Calcium-Magnesium-Vitamin D (CALCIUM 1200+D3 PO) Take 2 tablets by mouth daily.    . cetirizine (ZYRTEC) 10 MG tablet Take 10 mg by mouth at bedtime.    Marland Kitchen denosumab (XGEVA) 120 MG/1.7ML SOLN injection Inject 120 mg into the skin every 28 (twenty-eight) days.     Marland Kitchen ELIQUIS 5 MG TABS tablet Take 5 mg by mouth 2 (two) times daily.    Marland Kitchen glipiZIDE (GLUCOTROL XL) 10 MG 24 hr tablet Take  10 mg by mouth daily.    . hydrocortisone (CORTEF) 10 MG tablet Take 10 mg by mouth 2 (two) times daily.     . Iron, Ferrous Sulfate, 325 (65 Fe) MG TABS daily.    Marland Kitchen Leuprolide Acetate (ELIGARD [place]) Inject into the skin.    Marland Kitchen levothyroxine (SYNTHROID) 50 MCG tablet Take 50 mcg by mouth daily before breakfast.    . Misc Natural Products (OSTEO BI-FLEX ADV JOINT SHIELD PO) Take 1 tablet by mouth daily.     . Multiple Vitamin (MULTIVITAMIN WITH MINERALS) TABS tablet Take 1 tablet by mouth daily. One-A-Day Men's 76+    . pantoprazole (PROTONIX) 40 MG tablet Take 40 mg by mouth 2 (two) times daily.    . polyethylene glycol (MIRALAX /  GLYCOLAX) 17 g packet Take 17 g by mouth daily. W/coffee    . Probiotic Product (ALIGN) 4 MG CAPS Take 4 mg by mouth daily.     Marland Kitchen sulfamethoxazole-trimethoprim (BACTRIM DS) 800-160 MG tablet Take 1 tablet by mouth 2 (two) times daily. 28 tablet 0  . valsartan (DIOVAN) 320 MG tablet Take 160 mg by mouth daily.     No current facility-administered medications for this visit.    OBJECTIVE: Vitals:   03/01/21 1006  BP: 129/64  Pulse: (!) 58  Resp: 20  Temp: 98.4 F (36.9 C)     Body mass index is 33.29 kg/m.    ECOG FS:1 - Symptomatic but completely ambulatory  General: Well-developed, well-nourished, no acute distress.  Sitting in a wheelchair. Eyes: Pink conjunctiva, anicteric sclera.  HEENT: Normocephalic, moist mucous membranes. Lungs: No audible wheezing or coughing. Heart: Regular rate and rhythm. Abdomen: Soft, nontender, no obvious distention. Musculoskeletal: No edema, cyanosis, or clubbing. Neuro: Alert, answering all questions appropriately. Cranial nerves grossly intact. Skin: No rashes or petechiae noted. Psych: Normal affect.   LAB RESULTS:  Lab Results  Component Value Date   NA 139 03/01/2021   K 4.1 03/01/2021   CL 110 03/01/2021   CO2 20 (L) 03/01/2021   GLUCOSE 132 (H) 03/01/2021   BUN 19 03/01/2021   CREATININE 1.24 03/01/2021    CALCIUM 9.0 03/01/2021   PROT 6.7 03/01/2021   ALBUMIN 4.1 03/01/2021   AST 48 (H) 03/01/2021   ALT 30 03/01/2021   ALKPHOS 62 03/01/2021   BILITOT 0.6 03/01/2021   GFRNONAA 57 (L) 03/01/2021   GFRAA >60 09/01/2020    Lab Results  Component Value Date   WBC 7.4 03/01/2021   NEUTROABS 5.7 03/01/2021   HGB 10.9 (L) 03/01/2021   HCT 36.2 (L) 03/01/2021   MCV 96.0 03/01/2021   PLT 291 03/01/2021     STUDIES: No results found.  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  PLAN:    1. 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  Patient's PSA was initially greater than 8.0, but trended down to less than 0.1.  His most recent result was 0.06.  Today's result is pending.  Continue Zytiga as directed.  He can now transition Xgeva to every 3 months.  His last Eligard occurred in December 2021.  No treatment is needed today.  Return to clinic in 3 months for further evaluation and continuation of Zytiga, Xgeva, and Eligard.  2.  Bony metastasis: Patient has now received 1 year of Xgeva and can transition to treatment every 3 months as above.  Patient's most recent calcium levels are adequate to proceed as scheduled.   3.  Urinary frequency/hematuria: Patient underwent recent cystoscopy.  Continue follow-up with urology as scheduled. 4.  Hot flashes: Patient does not complain of this today.  Likely second Eligard, monitor. 5.  Anemia: Recent colonoscopy and EGD did not reveal any significant pathology.  He last received 510 mg of IV Feraheme on February 25, 2021.  Hemoglobin has improved to 10.9.  No intervention is needed.  Repeat laboratory work in 3 months as above. 6.  Weakness and fatigue: Multifactorial.  Monitor. 7.  Peripheral edema: Follow-up with primary care and cardiology as indicated.  Patient expressed understanding and was in agreement with this plan. He  also understands that He can call clinic at any time with any questions, concerns, or complaints.  Cancer Staging Prostate cancer [facility]) Staging form: Prostate, AJCC 8th Edition - Clinical stage from 02/17/2020: Stage IVB (cT2c, cN0, cM1b, PSA: 8.6, Grade Group: 5) - Signed by Lloyd Huger, MD on 02/17/2020 Prostate specific antigen (PSA) range: Less than 10 Histologic grading system: 5 grade system   Lloyd Huger, MD   03/01/2021 12:12 PM

## 2021-02-28 ENCOUNTER — Encounter (INDEPENDENT_AMBULATORY_CARE_PROVIDER_SITE_OTHER): Payer: Self-pay

## 2021-03-01 ENCOUNTER — Inpatient Hospital Stay: Payer: Medicare Other

## 2021-03-01 ENCOUNTER — Inpatient Hospital Stay (HOSPITAL_BASED_OUTPATIENT_CLINIC_OR_DEPARTMENT_OTHER): Payer: Medicare Other | Admitting: Oncology

## 2021-03-01 ENCOUNTER — Encounter: Payer: Self-pay | Admitting: Oncology

## 2021-03-01 VITALS — BP 129/64 | HR 58 | Temp 98.4°F | Resp 20 | Wt 232.0 lb

## 2021-03-01 DIAGNOSIS — D509 Iron deficiency anemia, unspecified: Secondary | ICD-10-CM | POA: Diagnosis not present

## 2021-03-01 DIAGNOSIS — C61 Malignant neoplasm of prostate: Secondary | ICD-10-CM

## 2021-03-01 LAB — COMPREHENSIVE METABOLIC PANEL
ALT: 30 U/L (ref 0–44)
AST: 48 U/L — ABNORMAL HIGH (ref 15–41)
Albumin: 4.1 g/dL (ref 3.5–5.0)
Alkaline Phosphatase: 62 U/L (ref 38–126)
Anion gap: 9 (ref 5–15)
BUN: 19 mg/dL (ref 8–23)
CO2: 20 mmol/L — ABNORMAL LOW (ref 22–32)
Calcium: 9 mg/dL (ref 8.9–10.3)
Chloride: 110 mmol/L (ref 98–111)
Creatinine, Ser: 1.24 mg/dL (ref 0.61–1.24)
GFR, Estimated: 57 mL/min — ABNORMAL LOW (ref >60)
Glucose, Bld: 132 mg/dL — ABNORMAL HIGH (ref 70–99)
Potassium: 4.1 mmol/L (ref 3.5–5.1)
Sodium: 139 mmol/L (ref 135–145)
Total Bilirubin: 0.6 mg/dL (ref 0.3–1.2)
Total Protein: 6.7 g/dL (ref 6.5–8.1)

## 2021-03-01 LAB — CBC WITH DIFFERENTIAL/PLATELET
Abs Immature Granulocytes: 0.04 10*3/uL (ref 0.00–0.07)
Basophils Absolute: 0 10*3/uL (ref 0.0–0.1)
Basophils Relative: 0 %
Eosinophils Absolute: 0 10*3/uL (ref 0.0–0.5)
Eosinophils Relative: 1 %
HCT: 36.2 % — ABNORMAL LOW (ref 39.0–52.0)
Hemoglobin: 10.9 g/dL — ABNORMAL LOW (ref 13.0–17.0)
Immature Granulocytes: 1 %
Lymphocytes Relative: 15 %
Lymphs Abs: 1.1 10*3/uL (ref 0.7–4.0)
MCH: 28.9 pg (ref 26.0–34.0)
MCHC: 30.1 g/dL (ref 30.0–36.0)
MCV: 96 fL (ref 80.0–100.0)
Monocytes Absolute: 0.5 10*3/uL (ref 0.1–1.0)
Monocytes Relative: 7 %
Neutro Abs: 5.7 10*3/uL (ref 1.7–7.7)
Neutrophils Relative %: 76 %
Platelets: 291 10*3/uL (ref 150–400)
RBC: 3.77 MIL/uL — ABNORMAL LOW (ref 4.22–5.81)
RDW: 18.1 % — ABNORMAL HIGH (ref 11.5–15.5)
WBC: 7.4 10*3/uL (ref 4.0–10.5)
nRBC: 0 % (ref 0.0–0.2)

## 2021-03-01 LAB — PSA: Prostatic Specific Antigen: 0.04 ng/mL (ref 0.00–4.00)

## 2021-03-01 NOTE — Progress Notes (Signed)
Patient here today for follow up regarding prostate cancer. Patients wife is concerned about anemia and xgeva injection, frequency. Patient also reports increased swelling to lower extremities, right arm and abdomen, has sent a message to follow up with vascular.

## 2021-03-09 ENCOUNTER — Telehealth (INDEPENDENT_AMBULATORY_CARE_PROVIDER_SITE_OTHER): Payer: Medicare Other | Admitting: Urology

## 2021-03-09 ENCOUNTER — Other Ambulatory Visit: Payer: Self-pay

## 2021-03-09 DIAGNOSIS — C61 Malignant neoplasm of prostate: Secondary | ICD-10-CM

## 2021-03-09 DIAGNOSIS — R351 Nocturia: Secondary | ICD-10-CM

## 2021-03-09 MED ORDER — DESMOPRESSIN ACETATE 0.2 MG PO TABS: 0.2000 mg | ORAL_TABLET | Freq: Every evening | ORAL | 0 refills | Status: DC

## 2021-03-09 NOTE — Progress Notes (Signed)
Virtual Visit via Telephone Note  I connected with Andrew Callahan on 03/09/21 at  3:45 PM EDT by telephone and verified that I am speaking with the correct person using two identifiers.   Patient location: Home Provider location: [facility] Urologic Office   I discussed the limitations, risks, security and privacy concerns of performing an evaluation and management service by telephone and the availability of in person appointments. We discussed the impact of the COVID-19 pandemic on the healthcare system, and the importance of social distancing and reducing patient and provider exposure. I also discussed with the patient that there may be a patient responsible charge related to this service. The patient expressed understanding and agreed to proceed.  Reason for visit: Urinary frequency, nocturia  History of Present Illness: 87 year old male with history of stroke and metastatic prostate cancer, as well as urinary retention and elevated PVRs. He underwent an uncomplicated HOLEP on 0/01/4096. He has been voiding with a very strong stream since surgery, but continues to have persistent bothersome overactive symptoms including urgency, frequency, and nocturia. He is compliant with his sleep apnea machine.  His metastatic prostate cancer is managed by oncology.  We did a cystoscopy in November 2021 that showed expected postop HOLEP findings of a wide open prostatic fossa, and verumontanum and the sphincter were intact, and bladder was normal-appearing.  He is not currently on any bladder medications.  He did not notice any improvement on Myrbetriq previously, and most recently no significant improvement on trospium.  He completed a voiding diary recently, and is voiding 5-7 times during the day, and 3-4 times overnight.    He denies any incontinence.  His primary issue is the nocturia 3-4 times overnight.  He is extremely bothered by this and interested in any other options to improve his nocturia.   He is already minimizing fluids in the evening and voiding prior to bedtime.  He is no longer taking diuretics.  At our last visit in early March 2022 he had an E. coli UTI and was treated with culture appropriate Bactrim.  He denies any improvement in his frequency or nocturia since that time.  I had an extensive conversation with them today about other options.  He has failed a trial of numerous OAB medications, and anatomically his prostate is open and he is emptying well.  We discussed the risks and benefits extensively of a trial of desmopressin before bed to see if we can improve some of his nocturia.  We discussed the risk of hyponatremia and seizures extensively, and he is willing to take these rest to see if he can improve his nocturia.  Recent sodium normal at 139 last week.  Trial of desmopressin nightly Lab visit for repeat sodium in 1 week and 1 month, call with those results and check on symptoms    I discussed the assessment and treatment plan with the patient. The patient was provided an opportunity to ask questions and all were answered. The patient agreed with the plan and demonstrated an understanding of the instructions.   The patient was advised to call back or seek an in-person evaluation if the symptoms worsen or if the condition fails to improve as anticipated.  I provided 12 minutes of non-face-to-face time during this encounter.   Billey Co, MD

## 2021-03-14 ENCOUNTER — Other Ambulatory Visit: Payer: Self-pay | Admitting: Pharmacist

## 2021-03-14 DIAGNOSIS — C61 Malignant neoplasm of prostate: Secondary | ICD-10-CM

## 2021-03-17 ENCOUNTER — Other Ambulatory Visit: Payer: Self-pay

## 2021-03-17 ENCOUNTER — Other Ambulatory Visit: Payer: Medicare Other

## 2021-03-17 DIAGNOSIS — C61 Malignant neoplasm of prostate: Secondary | ICD-10-CM

## 2021-03-18 LAB — BASIC METABOLIC PANEL
BUN/Creatinine Ratio: 21 (ref 10–24)
BUN: 15 mg/dL (ref 8–27)
CO2: 16 mmol/L — ABNORMAL LOW (ref 20–29)
Calcium: 8.2 mg/dL — ABNORMAL LOW (ref 8.6–10.2)
Chloride: 111 mmol/L — ABNORMAL HIGH (ref 96–106)
Creatinine, Ser: 0.73 mg/dL — ABNORMAL LOW (ref 0.76–1.27)
Glucose: 153 mg/dL — ABNORMAL HIGH (ref 65–99)
Potassium: 4.3 mmol/L (ref 3.5–5.2)
Sodium: 146 mmol/L — ABNORMAL HIGH (ref 134–144)
eGFR: 90 mL/min/{1.73_m2} (ref >59)

## 2021-03-21 DIAGNOSIS — I5032 Chronic diastolic (congestive) heart failure: Secondary | ICD-10-CM | POA: Insufficient documentation

## 2021-03-22 ENCOUNTER — Telehealth: Payer: Self-pay

## 2021-03-22 DIAGNOSIS — Z79899 Other long term (current) drug therapy: Secondary | ICD-10-CM

## 2021-03-22 NOTE — Telephone Encounter (Signed)
Called pt and wife per DPR. Informed wife of the information below. Wife gave verbal understanding. States that they have seen no improvement with desmopressin. Patient is still getting up to void 3-5 times per night. They question if the should give the medication more time or discontinue. Please advise.

## 2021-03-22 NOTE — Telephone Encounter (Signed)
-----   Message from Billey Co, MD sent at 03/22/2021  8:06 AM EDT ----- Sodium is actually slightly high which is ok(we worry about sodium being too low on desmopressin). RTC 4 weeks for clinic visit with BMP prior, thanks. Hope he is noticing an improvement in the nocturia   [name], MD 03/22/2021

## 2021-03-22 NOTE — Telephone Encounter (Signed)
Called pt's wife per DPR, informed her of the dose increase. Wife gave verbal understanding. Appt scheduled for 1 month.

## 2021-03-22 NOTE — Telephone Encounter (Signed)
Okay to increase the dose to 0.4 mg nightly from 0.2, thanks  [name], MD 03/22/2021

## 2021-03-28 ENCOUNTER — Other Ambulatory Visit: Payer: Self-pay | Admitting: Urology

## 2021-03-28 DIAGNOSIS — R351 Nocturia: Secondary | ICD-10-CM

## 2021-03-31 NOTE — Telephone Encounter (Signed)
I think he is referencing a condom catheter.  Okay to do fitting, and for him to use overnight, thanks  [name], MD 03/31/2021

## 2021-04-05 ENCOUNTER — Encounter: Payer: Self-pay | Admitting: Physician Assistant

## 2021-04-05 ENCOUNTER — Ambulatory Visit (INDEPENDENT_AMBULATORY_CARE_PROVIDER_SITE_OTHER): Payer: Medicare Other | Admitting: Physician Assistant

## 2021-04-05 ENCOUNTER — Other Ambulatory Visit: Payer: Self-pay

## 2021-04-05 VITALS — BP 158/92 | Ht 70.0 in | Wt 235.0 lb

## 2021-04-05 DIAGNOSIS — N3281 Overactive bladder: Secondary | ICD-10-CM | POA: Diagnosis not present

## 2021-04-05 NOTE — Progress Notes (Signed)
Condom Catheter Fitting  Patient presented to clinic today for condom catheter fitting for management of nocturia on desmopressin.  He states he is voiding 5-6 times overnight and this is greatly impacting his sleep quality.  He tends to awaken due to the urge to void and denies nocturnal enuresis.  I fitted him today for a [employer] condom catheter, size 35 (blue) sport. We discussed trimming pubic hair to allow for better fit and reduced pulling. I demonstrated application and removal of the catheter on the patient; he and his wife expressed understanding and all questions were answered.  Patient prefers night bags with plans for overnight wear. [employer] order placed today.  Debroah Loop, PA-C 04/05/21 5:47 PM  I spent 35 minutes on the day of the encounter to include pre-visit record review, face-to-face time with the patient, and post-visit ordering of tests.

## 2021-04-07 ENCOUNTER — Encounter: Payer: Self-pay | Admitting: Oncology

## 2021-04-08 ENCOUNTER — Other Ambulatory Visit: Payer: Self-pay | Admitting: *Deleted

## 2021-04-08 DIAGNOSIS — C61 Malignant neoplasm of prostate: Secondary | ICD-10-CM

## 2021-04-08 MED ORDER — ABIRATERONE ACETATE 250 MG PO TABS: 1000.0000 mg | ORAL_TABLET | Freq: Every day | ORAL | 2 refills | Status: DC

## 2021-04-11 ENCOUNTER — Other Ambulatory Visit: Payer: Self-pay | Admitting: *Deleted

## 2021-04-11 DIAGNOSIS — C61 Malignant neoplasm of prostate: Secondary | ICD-10-CM

## 2021-04-11 NOTE — Telephone Encounter (Signed)
Duplicate request from [facility]. Prescription refill sent in by Tillie Rung, RN on 4/29. Called [facility] to confirm they did received the 4/29 refill.

## 2021-04-14 ENCOUNTER — Other Ambulatory Visit: Payer: Self-pay

## 2021-04-14 ENCOUNTER — Other Ambulatory Visit: Payer: Medicare Other

## 2021-04-14 DIAGNOSIS — Z79899 Other long term (current) drug therapy: Secondary | ICD-10-CM

## 2021-04-15 LAB — BASIC METABOLIC PANEL
BUN/Creatinine Ratio: 13 (ref 10–24)
BUN: 12 mg/dL (ref 8–27)
CO2: 20 mmol/L (ref 20–29)
Calcium: 9.3 mg/dL (ref 8.6–10.2)
Chloride: 109 mmol/L — ABNORMAL HIGH (ref 96–106)
Creatinine, Ser: 0.89 mg/dL (ref 0.76–1.27)
Glucose: 100 mg/dL — ABNORMAL HIGH (ref 65–99)
Potassium: 3.7 mmol/L (ref 3.5–5.2)
Sodium: 145 mmol/L — ABNORMAL HIGH (ref 134–144)
eGFR: 85 mL/min/{1.73_m2} (ref >59)

## 2021-04-20 ENCOUNTER — Other Ambulatory Visit: Payer: Self-pay

## 2021-04-20 ENCOUNTER — Ambulatory Visit (INDEPENDENT_AMBULATORY_CARE_PROVIDER_SITE_OTHER): Payer: Medicare Other | Admitting: Urology

## 2021-04-20 DIAGNOSIS — N401 Enlarged prostate with lower urinary tract symptoms: Secondary | ICD-10-CM

## 2021-04-20 DIAGNOSIS — R351 Nocturia: Secondary | ICD-10-CM

## 2021-04-20 DIAGNOSIS — N3281 Overactive bladder: Secondary | ICD-10-CM | POA: Diagnosis not present

## 2021-04-20 DIAGNOSIS — N138 Other obstructive and reflux uropathy: Secondary | ICD-10-CM

## 2021-04-20 DIAGNOSIS — C61 Malignant neoplasm of prostate: Secondary | ICD-10-CM | POA: Diagnosis not present

## 2021-04-20 MED ORDER — GABAPENTIN 300 MG PO CAPS: 300.0000 mg | ORAL_CAPSULE | Freq: Two times a day (BID) | ORAL | 11 refills | Status: DC

## 2021-04-20 NOTE — Progress Notes (Signed)
   04/20/2021 10:07 AM   Serena Croissant [birth year] [id number]  Reason for visit: Follow up metastatic prostate cancer, urinary frequency/nocturia  HPI: 87 year old male with history of stroke and metastatic prostate cancer who previously presented with urinary retention and elevated PVRs. He underwent an uncomplicated HOLEP on 6/0/7371. He has been voiding with a very strong stream since surgery, but continues to have persistent bothersome overactive symptoms including urgency, frequency, and nocturia. He is compliant with his sleep apnea machine.His metastatic prostate cancer is managed by oncology, and most recent PSA is low at 0.04.  He is not having any incontinence during the day.  His primary issue is nocturia 4X per night, which is unchanged from prior to surgery.  He had a cystoscopy in November 2021 that showed expected postop HOLEP findings of a wide open prostatic fossa and verumontanum and sphincter were intact and bladder was normal appearing.  We have tried numerous medications for his overactive symptoms and nocturia including Myrbetriq, trospium, and even desmopressin.  He has not noticed significant improvement on any of these medications.  He completed a voiding diary, and is voiding 5-7 times during the day, and 3-4 times overnight.  He is compliant with sleep apnea machine.  We discussed the complexities of managing nocturia, especially in his comorbidities including metastatic prostate cancer and history of stroke.  I recommended a trial of gabapentin 300 mg twice daily.  Follow-up for virtual visit in 1 month for symptom check.  We could also consider PTNS in the future, as well as vibegron.   Billey Co, [facility] [address] [place], [place] [address] [phone]

## 2021-04-21 ENCOUNTER — Ambulatory Visit: Payer: Medicare Other | Admitting: Urology

## 2021-04-26 ENCOUNTER — Ambulatory Visit (INDEPENDENT_AMBULATORY_CARE_PROVIDER_SITE_OTHER): Payer: Medicare Other | Admitting: Vascular Surgery

## 2021-05-19 ENCOUNTER — Other Ambulatory Visit: Payer: Self-pay

## 2021-05-19 ENCOUNTER — Other Ambulatory Visit: Payer: Self-pay | Admitting: Physician Assistant

## 2021-05-19 ENCOUNTER — Telehealth (INDEPENDENT_AMBULATORY_CARE_PROVIDER_SITE_OTHER): Payer: Medicare Other | Admitting: Urology

## 2021-05-19 ENCOUNTER — Other Ambulatory Visit (HOSPITAL_COMMUNITY): Payer: Self-pay | Admitting: Physician Assistant

## 2021-05-19 DIAGNOSIS — R4781 Slurred speech: Secondary | ICD-10-CM

## 2021-05-19 DIAGNOSIS — N3281 Overactive bladder: Secondary | ICD-10-CM

## 2021-05-19 DIAGNOSIS — Z8673 Personal history of transient ischemic attack (TIA), and cerebral infarction without residual deficits: Secondary | ICD-10-CM

## 2021-05-19 DIAGNOSIS — C61 Malignant neoplasm of prostate: Secondary | ICD-10-CM

## 2021-05-19 MED ORDER — VIBEGRON 75 MG PO TABS: 75.0000 mg | ORAL_TABLET | Freq: Every day | ORAL | 11 refills | Status: DC

## 2021-05-19 NOTE — Progress Notes (Signed)
Virtual Visit via Telephone Note  I connected with Andrew Callahan and his wife on 05/19/21 at 11:00 AM EDT by telephone and verified that I am speaking with the correct person using two identifiers.   Patient location: Home Provider location: [facility] Urologic Office   I discussed the limitations, risks, security and privacy concerns of performing an evaluation and management service by telephone and the availability of in person appointments. We discussed the impact of the COVID-19 pandemic on the healthcare system, and the importance of social distancing and reducing patient and provider exposure. I also discussed with the patient that there may be a patient responsible charge related to this service. The patient expressed understanding and agreed to proceed.  Reason for visit: Metastatic prostate cancer, urinary frequency/nocturia  History of Present Illness: Briefly, 87 year old male with history of stroke and metastatic prostate cancer who previously presented with urinary retention and elevated PVRs. He underwent an uncomplicated HOLEP on 04/15/8126.  He has been voiding with a very strong stream since surgery, but continues to have persistent bothersome overactive symptoms including urgency, frequency, and nocturia.  He is compliant with his sleep apnea machine.  His metastatic prostate cancer is managed by oncology, and most recent PSA is low at 0.04.  He is not having any incontinence during the day.  His primary issue is nocturia 4X per night, which is unchanged from prior to surgery.   He had a cystoscopy in November 2021 that showed expected postop HOLEP findings of a wide open prostatic fossa and verumontanum and sphincter were intact and bladder was normal appearing.   We have tried numerous medications for his overactive symptoms and nocturia including Myrbetriq, trospium, and even desmopressin.  He has not noticed significant improvement on any of these medications.  He completed a  voiding diary, and is voiding 5-7 times during the day, and 3-4 times overnight.  He is compliant with sleep apnea machine.   We discussed the complexities of managing nocturia, especially in his comorbidities including metastatic prostate cancer and history of stroke.   Last month we tried gabapentin 300 mg twice daily for his urinary frequency.  He notes no improvement on this medication.  He also had some worsening confusion, and this medication was stopped earlier this week.  I recommended trying Vibegron for his overactive urinary symptoms and nocturia.  I sent a prescription to pharmacy, and they will try to pick up a sample from our [place] location tomorrow   Follow Up: Virtual visit 1 month symptom check   I discussed the assessment and treatment plan with the patient. The patient was provided an opportunity to ask questions and all were answered. The patient agreed with the plan and demonstrated an understanding of the instructions.   The patient was advised to call back or seek an in-person evaluation if the symptoms worsen or if the condition fails to improve as anticipated.  I provided 8 minutes of non-face-to-face time during this encounter.   Billey Co, MD

## 2021-05-26 ENCOUNTER — Ambulatory Visit: Payer: Medicare Other

## 2021-05-27 ENCOUNTER — Other Ambulatory Visit: Payer: Self-pay

## 2021-05-27 ENCOUNTER — Ambulatory Visit
Admission: RE | Admit: 2021-05-27 | Discharge: 2021-05-27 | Disposition: A | Discharge status: Home / Self Care | Payer: Medicare Other | Source: Ambulatory Visit | Attending: Physician Assistant | Admitting: Physician Assistant

## 2021-05-27 DIAGNOSIS — R4781 Slurred speech: Secondary | ICD-10-CM | POA: Diagnosis present

## 2021-05-27 DIAGNOSIS — Z8673 Personal history of transient ischemic attack (TIA), and cerebral infarction without residual deficits: Secondary | ICD-10-CM | POA: Insufficient documentation

## 2021-06-02 ENCOUNTER — Inpatient Hospital Stay: Payer: Medicare Other | Attending: Oncology

## 2021-06-02 ENCOUNTER — Inpatient Hospital Stay (HOSPITAL_BASED_OUTPATIENT_CLINIC_OR_DEPARTMENT_OTHER): Payer: Medicare Other | Admitting: Oncology

## 2021-06-02 ENCOUNTER — Inpatient Hospital Stay: Payer: Medicare Other

## 2021-06-02 VITALS — BP 144/90 | HR 64 | Temp 98.6°F | Resp 18 | Wt 227.0 lb

## 2021-06-02 DIAGNOSIS — Z79899 Other long term (current) drug therapy: Secondary | ICD-10-CM | POA: Insufficient documentation

## 2021-06-02 DIAGNOSIS — C61 Malignant neoplasm of prostate: Secondary | ICD-10-CM

## 2021-06-02 DIAGNOSIS — Z5111 Encounter for antineoplastic chemotherapy: Secondary | ICD-10-CM | POA: Diagnosis present

## 2021-06-02 DIAGNOSIS — D509 Iron deficiency anemia, unspecified: Secondary | ICD-10-CM

## 2021-06-02 DIAGNOSIS — C7951 Secondary malignant neoplasm of bone: Secondary | ICD-10-CM | POA: Diagnosis present

## 2021-06-02 LAB — CBC WITH DIFFERENTIAL/PLATELET
Abs Immature Granulocytes: 0.02 10*3/uL (ref 0.00–0.07)
Basophils Absolute: 0 10*3/uL (ref 0.0–0.1)
Basophils Relative: 0 %
Eosinophils Absolute: 0.1 10*3/uL (ref 0.0–0.5)
Eosinophils Relative: 1 %
HCT: 39.2 % (ref 39.0–52.0)
Hemoglobin: 12.9 g/dL — ABNORMAL LOW (ref 13.0–17.0)
Immature Granulocytes: 0 %
Lymphocytes Relative: 19 %
Lymphs Abs: 1.7 10*3/uL (ref 0.7–4.0)
MCH: 28.5 pg (ref 26.0–34.0)
MCHC: 32.9 g/dL (ref 30.0–36.0)
MCV: 86.5 fL (ref 80.0–100.0)
Monocytes Absolute: 0.5 10*3/uL (ref 0.1–1.0)
Monocytes Relative: 6 %
Neutro Abs: 6.4 10*3/uL (ref 1.7–7.7)
Neutrophils Relative %: 74 %
Platelets: 246 10*3/uL (ref 150–400)
RBC: 4.53 MIL/uL (ref 4.22–5.81)
RDW: 14.7 % (ref 11.5–15.5)
WBC: 8.8 10*3/uL (ref 4.0–10.5)
nRBC: 0 % (ref 0.0–0.2)

## 2021-06-02 LAB — PSA: Prostatic Specific Antigen: <0.01 ng/mL (ref 0.00–4.00)

## 2021-06-02 LAB — IRON AND TIBC
Iron: 46 ug/dL (ref 45–182)
Saturation Ratios: 13 % — ABNORMAL LOW (ref 17.9–39.5)
TIBC: 354 ug/dL (ref 250–450)
UIBC: 308 ug/dL

## 2021-06-02 LAB — COMPREHENSIVE METABOLIC PANEL
ALT: 15 U/L (ref 0–44)
AST: 30 U/L (ref 15–41)
Albumin: 4 g/dL (ref 3.5–5.0)
Alkaline Phosphatase: 70 U/L (ref 38–126)
Anion gap: 8 (ref 5–15)
BUN: 12 mg/dL (ref 8–23)
CO2: 23 mmol/L (ref 22–32)
Calcium: 9.1 mg/dL (ref 8.9–10.3)
Chloride: 110 mmol/L (ref 98–111)
Creatinine, Ser: 0.98 mg/dL (ref 0.61–1.24)
GFR, Estimated: >60 mL/min (ref >60)
Glucose, Bld: 102 mg/dL — ABNORMAL HIGH (ref 70–99)
Potassium: 4 mmol/L (ref 3.5–5.1)
Sodium: 141 mmol/L (ref 135–145)
Total Bilirubin: 0.5 mg/dL (ref 0.3–1.2)
Total Protein: 7.3 g/dL (ref 6.5–8.1)

## 2021-06-02 LAB — FERRITIN: Ferritin: 16 ng/mL — ABNORMAL LOW (ref 24–336)

## 2021-06-02 MED ORDER — DENOSUMAB 120 MG/1.7ML ~~LOC~~ SOLN
120.0000 mg | Freq: Once | SUBCUTANEOUS | Status: AC
  Administered 2021-06-02: 120 mg via SUBCUTANEOUS
  Filled 2021-06-02: qty 1.7

## 2021-06-02 MED ORDER — LEUPROLIDE ACETATE (6 MONTH) 45 MG ~~LOC~~ KIT
45.0000 mg | PACK | Freq: Once | SUBCUTANEOUS | Status: AC
  Administered 2021-06-02: 45 mg via SUBCUTANEOUS
  Filled 2021-06-02: qty 45

## 2021-06-02 NOTE — Progress Notes (Signed)
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  INTERVAL HISTORY: Patient returns to clinic today for further evaluation and continuation of Xgeva, Eligard, and Zytiga.  He was last seen in clinic with his wife on 03/02/21.  In the interim he met with urology on 03/09/2021 for urinary retention/nocturia and urinary frequency.  He was treated for an E. coli UTI in March 2022 with culture appropriate Bactrim.  Plan was for trial of desmopressin nightly and to try condom catheters.  Unfortunately, patient reports that neither seem to be working.  His condom catheter continues to fall off with any movement while sleeping.  He has ordered a new external catheter from [employer] and will try that in the next week or so.  Reports starting gabapentin 300 mg twice a day to manage her overactive bladder by urology and developed slurred speech and difficulty with memory.  Gabapentin was discontinued and he was referred to neurology.  He met with Dr. Melrose Nakayama on 05/19/2021 and had MRI of his brain due to persistence of symptoms after discontinuing gabapentin.  MRI did not reveal any acute intracranial abnormality.    Today he is here for Xgeva and Eligard and possible IV iron.  He reports feeling almost back to baseline.  Performance status has improved.  He still uses his rolling walker for ambulation.  He feels his balance is still not great.  Reports an occasional loose stool consistency like peanut butter over the past 1 to 2 months.  Has occasional incontinent episodes.  He continues to tolerate oral iron supplements.  Reports dizziness when he bends over.  He has not had any recent  falls.   REVIEW OF SYSTEMS:   Review of Systems  Constitutional:  Positive for malaise/fatigue. Negative for chills, fever and weight loss.  HENT:  Negative for congestion, ear pain and tinnitus.   Eyes: Negative.  Negative for blurred vision and double vision.  Respiratory: Negative.  Negative for cough, sputum production and shortness of breath.   Cardiovascular: Negative.  Negative for chest pain, palpitations and leg swelling.  Gastrointestinal: Negative.  Negative for abdominal pain, constipation, diarrhea, nausea and vomiting.  Genitourinary:  Positive for frequency. Negative for dysuria and urgency.  Musculoskeletal:  Negative for back pain and falls.  Skin: Negative.  Negative for rash.  Neurological:  Positive for dizziness and weakness. Negative for headaches.  Endo/Heme/Allergies: Negative.  Does not bruise/bleed easily.  Psychiatric/Behavioral: Negative.  Negative for depression. The patient is not nervous/anxious and does not have insomnia.    As per HPI. Otherwise, a complete review of systems is negative.  PAST MEDICAL HISTORY: Past Medical History:  Diagnosis Date   Acquired hypothyroidism    Aortic atherosclerosis (HCC)    Atrial fibrillation (HCC)    Chronic constipation    CVA (cerebral vascular accident) ([place])    DDD (degenerative disc disease), cervical    Diabetes mellitus without complication ([place])    Diverticulosis    Elevated prostate specific antigen (PSA)    Family history of leukemia    Family history of lung cancer    History of kidney stones    History of pituitary adenoma    Hyperlipidemia    Hypertension    Hypogonadism in  male    Hypothyroid    Hypothyroidism    IDA (iron deficiency anemia)    Lumbar disc disease    Nephrolithiasis    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 Mohs.   Stroke ([place])    throat / slight difficulty swallowing    PAST SURGICAL HISTORY: Past Surgical History:  Procedure Laterality Date   BACK SURGERY  1997   L3, 4, 5 laminectomy    BACK SURGERY  1998   hard wire removed, fusion of L3-5   BACK SURGERY  2009   laminotomy/foraminotomy w/ decompression of nerve root, facet thermal ablation L2-3.    COLONOSCOPY     COLONOSCOPY WITH PROPOFOL N/A 02/04/2021   Procedure: COLONOSCOPY WITH PROPOFOL;  Surgeon: Lesly Rubenstein, MD;  Location: [facility] ENDOSCOPY;  Service: Endoscopy;  Laterality: N/A;  STAT CBC BEFORE PROCEDURE   ESOPHAGOGASTRODUODENOSCOPY (EGD) WITH PROPOFOL N/A 02/04/2021   Procedure: ESOPHAGOGASTRODUODENOSCOPY (EGD) WITH PROPOFOL;  Surgeon: Lesly Rubenstein, MD;  Location: [facility] ENDOSCOPY;  Service: Endoscopy;  Laterality: N/A;   HERNIA REPAIR     HOLEP-LASER ENUCLEATION OF THE PROSTATE WITH MORCELLATION N/A 06/18/2020   Procedure: HOLEP-LASER ENUCLEATION OF THE PROSTATE WITH MORCELLATION;  Surgeon: Billey Co, MD;  Location: [facility] ORS;  Service: Urology;  Laterality: N/A;   JOINT REPLACEMENT     left knee partial   KIDNEY STONE SURGERY  2010   KNEE SURGERY Left 2007   Arthroscopic partial knee replacement, replaced medial side    MOHS SURGERY Right 2014   squamous cell carcinoma   NECK SURGERY  2008   fusion with hardwire in place C3 through C7    pituitary adenoma  1994   benign   PROSTATE BIOPSY N/A 01/01/2020   Procedure: PROSTATE BIOPSY URO NAV FUSION;  Surgeon: Royston Cowper, MD;  Location: [facility] ORS;  Service: Urology;  Laterality: N/A;   [place] SURGERY  2016   fusion L4    TONSILLECTOMY      FAMILY HISTORY: Family History  Problem Relation Age of Onset   Stroke Mother    Heart attack Father    Leukemia Sister 84   Lung cancer Brother 13    ADVANCED DIRECTIVES (Y/N):  N  HEALTH MAINTENANCE: Social History   Tobacco Use   Smoking status: Former     Pack years: 0.00   Smokeless tobacco: Never   Tobacco comments:    quit 50 years ago  Vaping Use   Vaping Use: Never used  Substance Use Topics   Alcohol use: Yes    Comment: rarely   Drug use: Never     Colonoscopy:  PAP:  Bone density:  Lipid panel:  Allergies  Allergen Reactions   Metoprolol Other (See Comments)    dizziness    Rivaroxaban Other (See Comments)    hematuria     Current Outpatient Medications  Medication Sig Dispense Refill   abiraterone acetate (ZYTIGA) 250 MG tablet Take 4 tablets (1,000 mg total) by mouth daily. Take on an empty stomach 1 hour before or 2 hours after a meal 120 tablet 2   ACCU-CHEK GUIDE test strip daily. use as directed     Accu-Chek Softclix Lancets lancets daily.     B  Complex-C (SUPER B COMPLEX PO) Take 1 capsule by mouth daily.      Blood Glucose Monitoring Suppl (ACCU-CHEK GUIDE) w/Device KIT See admin instructions.     Calcium-Magnesium-Vitamin D (CALCIUM 1200+D3 PO) Take 2 tablets by mouth daily.     cetirizine (ZYRTEC) 10 MG tablet Take 10 mg by mouth at bedtime.     denosumab (XGEVA) 120 MG/1.7ML SOLN injection Inject 120 mg into the skin every 3 (three) months.     ELIQUIS 5 MG TABS tablet Take 5 mg by mouth 2 (two) times daily.     glipiZIDE (GLUCOTROL XL) 10 MG 24 hr tablet Take 10 mg by mouth daily.     hydrocortisone (CORTEF) 10 MG tablet Take 10 mg by mouth 2 (two) times daily.      Leuprolide Acetate (ELIGARD [place]) Inject into the skin.     levothyroxine (SYNTHROID) 50 MCG tablet Take 50 mcg by mouth daily before breakfast.     Misc Natural Products (OSTEO BI-FLEX ADV JOINT SHIELD PO) Take 1 tablet by mouth daily.      Multiple Vitamin (MULTIVITAMIN WITH MINERALS) TABS tablet Take 1 tablet by mouth daily. One-A-Day Men's 50+     polyethylene glycol (MIRALAX / GLYCOLAX) 17 g packet Take 17 g by mouth daily. W/coffee     Probiotic Product (ALIGN) 4 MG CAPS Take 4 mg by mouth daily.      Vibegron 75 MG TABS Take 75 mg  by mouth daily. 30 tablet 11   furosemide (LASIX) 40 MG tablet Take 40 mg by mouth daily.     Iron, Ferrous Sulfate, 325 (65 Fe) MG TABS daily.     valsartan (DIOVAN) 320 MG tablet Take 160 mg by mouth 2 (two) times daily.     No current facility-administered medications for this visit.    OBJECTIVE: There were no vitals filed for this visit.   Body mass index is 32.57 kg/m.    ECOG FS:0 - Asymptomatic  Physical Exam Constitutional:      Appearance: Normal appearance.  HENT:     Head: Normocephalic and atraumatic.  Eyes:     Pupils: Pupils are equal, round, and reactive to light.  Cardiovascular:     Rate and Rhythm: Normal rate and regular rhythm.     Heart sounds: Normal heart sounds. No murmur heard. Pulmonary:     Effort: Pulmonary effort is normal.     Breath sounds: Normal breath sounds. No wheezing.  Abdominal:     General: Bowel sounds are normal. There is no distension.     Palpations: Abdomen is soft.     Tenderness: There is no abdominal tenderness.  Musculoskeletal:        General: Normal range of motion.     Cervical back: Normal range of motion.     Right ankle: Swelling present.     Left ankle: Swelling present.  Skin:    General: Skin is warm and dry.     Findings: No rash.  Neurological:     Mental Status: He is alert and oriented to person, place, and time.  Psychiatric:        Judgment: Judgment normal.     LAB RESULTS:  Lab Results  Component Value Date   NA 141 06/02/2021   K 4.0 06/02/2021   CL 110 06/02/2021   CO2 23 06/02/2021   GLUCOSE 102 (H) 06/02/2021   BUN 12 06/02/2021   CREATININE 0.98 06/02/2021   CALCIUM 9.1 06/02/2021   PROT  7.3 06/02/2021   ALBUMIN 4.0 06/02/2021   AST 30 06/02/2021   ALT 15 06/02/2021   ALKPHOS 70 06/02/2021   BILITOT 0.5 06/02/2021   GFRNONAA >60 06/02/2021   GFRAA >60 09/01/2020    Lab Results  Component Value Date   WBC 8.8 06/02/2021   NEUTROABS 6.4 06/02/2021   HGB 12.9 (L) 06/02/2021    HCT 39.2 06/02/2021   MCV 86.5 06/02/2021   PLT 246 06/02/2021     STUDIES: MR BRAIN WO CONTRAST  Result Date: 05/27/2021 CLINICAL DATA:  Memory loss.  History of stroke. EXAM: MRI HEAD WITHOUT CONTRAST TECHNIQUE: Multiplanar, multiecho pulse sequences of the brain and surrounding structures were obtained without intravenous contrast. COMPARISON:  None. FINDINGS: Brain: There is no evidence of an acute infarct, intracranial hemorrhage, mass, midline shift, or extra-axial fluid collection. Small T2 hyperintensities in the cerebral white matter bilaterally are nonspecific but compatible with mild chronic small vessel ischemic disease. There is a chronic lacunar infarct in the posterior limb of the left internal capsule with associated wallerian degeneration in the brainstem. There is a tiny chronic infarct posterolaterally in the left cerebellar hemisphere. There is moderate generalized cerebral atrophy. There is an empty sella configuration with evidence of prior trans-sphenoidal pituitary surgery. Vascular: Major intracranial vascular flow voids are preserved. Skull and upper cervical spine: No suspicious marrow lesion. Sinuses/Orbits: Postsurgical changes in the sphenoid sinuses, possibly with superimposed chronic sinusitis. Trace right mastoid fluid. Unremarkable orbits. Other: None. IMPRESSION: 1. No acute intracranial abnormality. 2. Mild chronic small vessel ischemic disease and moderate cerebral atrophy. 3. Chronic left internal capsule and left cerebellar infarcts. Electronically Signed   By: Logan Bores M.D.   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  PLAN:    1. 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 PSA from 06/02/2021 is less than 0.1. He is currently on Zytiga + every 3 month Xgeva and every 46-monthEligard. He received Eligard last in December 2022-had  bleeding from injection site requiring him to be seen in the ED for application of compression wrap. Labs from 06/02/2021 show a hemoglobin of 12.9 which is a huge improvement from previous (10.9).  Proceed with XDelton Seeand Eligard today. Calcium level 9.1.  2.  Bony metastasis:  Proceed with Xgeva. Calcium level 9.1  3.  Dizziness/weakness: Improved tremendously. Continue using rolling walker  4. iron deficiency anemia: He has received 4 doses of IV Feraheme-last given on 02/25/2021. He continues to tolerate oral iron supplements. Iron labs pending for today. His hemoglobin has greatly improved. Recommend holding iron today and will wait for iron studies to see if he needs additional IV iron.  5.  Urinary frequency:  Followed by urology. Unfortunately, continues to have nocturia.  6.  Hot flashes: Secondary to Eligard. He self stopped Effexor secondary to reading side effect profile.  7. Hyperglycemia:  Improved since starting glipizide Blood glucose today is 102  Disposition:  Will call patient with results of his iron studies. Proceed with XDelton Seeand Eligard today. RTC in 3 months with repeat lab work, MD assessment and Xgeva plus or minus IV iron  Addendum- Iron studies are low. Recommend 1 additional dose of IV Feraheme.  Called and discussed with patient's wife.  They are agreeable.  Patient expressed understanding and was in agreement with this plan. He also understands that He can call clinic at any time with any  questions, concerns, or complaints.   Greater than 50% was spent in counseling and coordination of care with this patient including but not limited to discussion of the relevant topics above (See A&P) including, but not limited to diagnosis and management of acute and chronic medical conditions.    Cancer Staging Prostate cancer [facility]) Staging form: Prostate, AJCC 8th Edition - Clinical stage from 02/17/2020: Stage IVB (cT2c, cN0, cM1b, PSA: 8.6, Grade Group:  5) - Signed by Lloyd Huger, MD on 02/17/2020 Prostate specific antigen (PSA) range: Less than 10 Histologic grading system: 5 grade system   Jacquelin Hawking, NP   06/02/2021 1:38 PM

## 2021-06-03 ENCOUNTER — Encounter: Payer: Self-pay | Admitting: Oncology

## 2021-06-07 NOTE — Telephone Encounter (Signed)
I think it is important to remember that he was having nocturia 3-4 times overnight before surgery, as well as had a catheter for urinary retention.  After surgery he is now emptying his bladder much better, and does not have a catheter.  He could also consider having his CPAP settings looked at, as sleep apnea can definitely cause patients to get up overnight.  His history of stroke likely has damaged the bladder which causes his persistent overactive symptoms.  I think PTNS is a reasonable thing to try, and there is no risk in that, okay to schedule if they would like  [name], MD 06/07/2021

## 2021-06-09 ENCOUNTER — Inpatient Hospital Stay: Payer: Medicare Other

## 2021-06-09 ENCOUNTER — Other Ambulatory Visit: Payer: Self-pay

## 2021-06-09 VITALS — BP 156/82 | HR 49 | Temp 97.6°F | Resp 18

## 2021-06-09 DIAGNOSIS — C61 Malignant neoplasm of prostate: Secondary | ICD-10-CM

## 2021-06-09 DIAGNOSIS — Z5111 Encounter for antineoplastic chemotherapy: Secondary | ICD-10-CM | POA: Diagnosis not present

## 2021-06-09 MED ORDER — SODIUM CHLORIDE 0.9 % IV SOLN
Freq: Once | INTRAVENOUS | Status: AC
  Filled 2021-06-09: qty 250

## 2021-06-09 MED ORDER — SODIUM CHLORIDE 0.9 % IV SOLN
510.0000 mg | Freq: Once | INTRAVENOUS | Status: AC
  Administered 2021-06-09: 510 mg via INTRAVENOUS
  Filled 2021-06-09: qty 510

## 2021-06-09 NOTE — Progress Notes (Signed)
Post Feraheme infusion, HR 49, BP 156/82. Patient asymptomatic. Dr. Grayland Ormond made aware and per MD, okay to discharge home.

## 2021-06-13 ENCOUNTER — Emergency Department: Payer: Medicare Other

## 2021-06-13 ENCOUNTER — Other Ambulatory Visit: Payer: Self-pay

## 2021-06-13 ENCOUNTER — Emergency Department
Admission: EM | Admit: 2021-06-13 | Discharge: 2021-06-13 | Disposition: A | Discharge status: Home / Self Care | Payer: Medicare Other | Attending: Emergency Medicine | Admitting: Emergency Medicine

## 2021-06-13 DIAGNOSIS — Z85828 Personal history of other malignant neoplasm of skin: Secondary | ICD-10-CM | POA: Insufficient documentation

## 2021-06-13 DIAGNOSIS — I4891 Unspecified atrial fibrillation: Secondary | ICD-10-CM | POA: Diagnosis not present

## 2021-06-13 DIAGNOSIS — E039 Hypothyroidism, unspecified: Secondary | ICD-10-CM | POA: Insufficient documentation

## 2021-06-13 DIAGNOSIS — I5032 Chronic diastolic (congestive) heart failure: Secondary | ICD-10-CM | POA: Insufficient documentation

## 2021-06-13 DIAGNOSIS — Z85830 Personal history of malignant neoplasm of bone: Secondary | ICD-10-CM | POA: Diagnosis not present

## 2021-06-13 DIAGNOSIS — Z96652 Presence of left artificial knee joint: Secondary | ICD-10-CM | POA: Insufficient documentation

## 2021-06-13 DIAGNOSIS — Z79899 Other long term (current) drug therapy: Secondary | ICD-10-CM | POA: Insufficient documentation

## 2021-06-13 DIAGNOSIS — Z7984 Long term (current) use of oral hypoglycemic drugs: Secondary | ICD-10-CM | POA: Insufficient documentation

## 2021-06-13 DIAGNOSIS — Z87891 Personal history of nicotine dependence: Secondary | ICD-10-CM | POA: Insufficient documentation

## 2021-06-13 DIAGNOSIS — E119 Type 2 diabetes mellitus without complications: Secondary | ICD-10-CM | POA: Diagnosis not present

## 2021-06-13 DIAGNOSIS — Z7901 Long term (current) use of anticoagulants: Secondary | ICD-10-CM | POA: Insufficient documentation

## 2021-06-13 DIAGNOSIS — Z8546 Personal history of malignant neoplasm of prostate: Secondary | ICD-10-CM | POA: Diagnosis not present

## 2021-06-13 DIAGNOSIS — K5900 Constipation, unspecified: Secondary | ICD-10-CM | POA: Diagnosis present

## 2021-06-13 DIAGNOSIS — I110 Hypertensive heart disease with heart failure: Secondary | ICD-10-CM | POA: Diagnosis not present

## 2021-06-13 NOTE — ED Notes (Signed)
Patient placed on bedpan.

## 2021-06-13 NOTE — ED Notes (Signed)
See triage note  Presents with constipation  States last BM was about 1 week prior  Has used 2 Fleets enema this am with very min results  Denies any n/v

## 2021-06-13 NOTE — ED Provider Notes (Signed)
Patient  [facility] Emergency Department Provider Note   ____________________________________________   Event Date/Time   First MD Initiated Contact with Patient 06/13/21 1426     (approximate)  I have reviewed the triage vital signs and the nursing notes.   HISTORY  Chief Complaint Constipation   HPI Andrew Callahan is a 87 y.o. male presents to the ED with complaint of constipation.  He has a history of constipation and was told by his PCP to begin taking MiraLAX daily which he does with his coffee  every day.  Patient and wife states they were not aware that he was supposed to be drinking lots of fluids after drinking MiraLAX.  He has a longstanding history of constipation.  Patient states that he did 2 fleets enemas this morning without any results.  In talking with him in more detail he has been getting pieces of stool out but does not feel like he has been relieved of the discomfort.         Past Medical History:  Diagnosis Date   Acquired hypothyroidism    Aortic atherosclerosis (HCC)    Atrial fibrillation ([place])    Chronic constipation    CVA (cerebral vascular accident) ([place])    DDD (degenerative disc disease), cervical    Diabetes mellitus without complication ([place])    Diverticulosis    Elevated prostate specific antigen (PSA)    Family history of leukemia    Family history of lung cancer    History of kidney stones    History of pituitary adenoma    Hyperlipidemia    Hypertension    Hypogonadism in male    Hypothyroid    Hypothyroidism    IDA (iron deficiency anemia)    Lumbar disc disease    Nephrolithiasis    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 Mohs.   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   Lower urinary tract symptoms (LUTS) 05/19/2020   Metastatic adenocarcinoma to bone ([place]) 05/03/2020   Stage IV adenocarcinoma of prostate ([place]) 05/03/2020   Genetic testing 04/07/2020   Family history of lung cancer    Family history of leukemia    Numbness and tingling of foot 03/22/2020   Goals of care, counseling/discussion 02/17/2020   Prostate cancer ([place]) 02/15/2020   Tubular adenoma 11/04/2019   Medicare annual wellness visit, initial 11/04/2019   Dysarthria 07/01/2019   History of CVA (cerebrovascular accident) 07/01/2019   Anticoagulant long-term use 06/22/2019   Left sided lacunar infarction ([place]) 06/18/2019   Hyperlipidemia 05/31/2019   Coronary artery calcification 04/12/2019   Benign essential hypertension 01/15/2019   History of pituitary adenoma 01/15/2019   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[place]) 01/15/2019   Hematuria 10/11/2018   Sinus bradycardia 10/11/2018   Thyroid disease 10/11/2018   Chronic constipation 10/04/2018   Diverticulosis large intestine w/o perforation or abscess w/o bleeding 10/04/2018  Polyp of descending colon 10/04/2018   OSA (obstructive sleep apnea) 05/15/2018   PAF (paroxysmal atrial fibrillation) ([place]) 01/16/2018   Type 2 diabetes mellitus without complication, without long-term current use of insulin ([place]) 11/08/2017   Adrenal insufficiency ([place]) 10/05/2017   Snores 05/09/2017   Health care maintenance 11/06/2016   Mononeuropathy 10/16/2016   Obesity (BMI 35.0-39.9 without comorbidity) 04/03/2016   Tinnitus of both ears 04/03/2016   Spinal stenosis of  lumbar region 01/28/2015   Benign prostatic hyperplasia without urinary obstruction 04/07/2013   Lumbar degenerative disc disease 12/23/2012   Acquired hypothyroidism 11/24/2010   Calculus, kidney 11/24/2010   Raised prostate specific antigen 11/24/2010   Hearing loss 11/24/2010   Hyperlipidemia associated with type 2 diabetes mellitus ([place]) 11/24/2010   Testicular hypofunction 11/24/2010   Osteoarthritis of multiple joints 11/24/2010   Rhinitis, allergic 11/24/2010   Skin tag 11/24/2010    Past Surgical History:  Procedure Laterality Date   BACK SURGERY  1997   L3, 4, 5 laminectomy    BACK SURGERY  1998   hard wire removed, fusion of L3-5   BACK SURGERY  2009   laminotomy/foraminotomy w/ decompression of nerve root, facet thermal ablation L2-3.    COLONOSCOPY     COLONOSCOPY WITH PROPOFOL N/A 02/04/2021   Procedure: COLONOSCOPY WITH PROPOFOL;  Surgeon: Lesly Rubenstein, MD;  Location: [facility] ENDOSCOPY;  Service: Endoscopy;  Laterality: N/A;  STAT CBC BEFORE PROCEDURE   ESOPHAGOGASTRODUODENOSCOPY (EGD) WITH PROPOFOL N/A 02/04/2021   Procedure: ESOPHAGOGASTRODUODENOSCOPY (EGD) WITH PROPOFOL;  Surgeon: Lesly Rubenstein, MD;  Location: [facility] ENDOSCOPY;  Service: Endoscopy;  Laterality: N/A;   HERNIA REPAIR     HOLEP-LASER ENUCLEATION OF THE PROSTATE WITH MORCELLATION N/A 06/18/2020   Procedure: HOLEP-LASER ENUCLEATION OF THE PROSTATE WITH MORCELLATION;  Surgeon: Billey Co, MD;  Location: [facility] ORS;  Service: Urology;  Laterality: N/A;   JOINT REPLACEMENT     left knee partial   KIDNEY STONE SURGERY  2010   KNEE SURGERY Left 2007   Arthroscopic partial knee replacement, replaced medial side    MOHS SURGERY Right 2014   squamous cell carcinoma   NECK SURGERY  2008   fusion with hardwire in place C3 through C7    pituitary adenoma  1994   benign   PROSTATE BIOPSY N/A 01/01/2020   Procedure: PROSTATE BIOPSY URO NAV FUSION;  Surgeon: Royston Cowper, MD;  Location: [facility] ORS;   Service: Urology;  Laterality: N/A;   [place] SURGERY  2016   fusion L4    TONSILLECTOMY      Prior to Admission medications   Medication Sig Start Date End Date Taking? Authorizing Provider  abiraterone acetate (ZYTIGA) 250 MG tablet Take 4 tablets (1,000 mg total) by mouth daily. Take on an empty stomach 1 hour before or 2 hours after a meal 04/08/21   Lloyd Huger, MD  ACCU-CHEK GUIDE test strip daily. use as directed 02/02/21   [provider]  Accu-Chek Softclix Lancets lancets daily. 02/02/21   [provider]  B Complex-C (SUPER B COMPLEX PO) Take 1 capsule by mouth daily.     [provider]  Blood Glucose Monitoring Suppl (ACCU-CHEK GUIDE) w/Device KIT See admin instructions. 02/02/21   [provider]  Calcium-Magnesium-Vitamin D (CALCIUM 1200+D3 PO) Take 2 tablets by mouth daily.    [provider]  cetirizine (ZYRTEC) 10 MG tablet Take 10 mg by mouth at bedtime.    [provider]  denosumab (XGEVA) 120 MG/1.7ML SOLN injection Inject 120 mg into the skin every 3 (three) months.    [provider]  ELIQUIS 5 MG TABS tablet Take 5 mg by mouth 2 (two) times daily. 11/04/19   [provider]  furosemide (LASIX) 40 MG tablet Take 40 mg by mouth daily. 04/11/21   [provider]  glipiZIDE (GLUCOTROL XL) 10 MG 24 hr tablet Take 10 mg by mouth daily. 01/31/21   [provider]  hydrocortisone (CORTEF) 10 MG tablet Take 10 mg by mouth 2 (two) times daily.  10/13/19   [provider]  Iron, Ferrous Sulfate, 325 (65 Fe) MG TABS daily. 10/11/20   [provider]  Leuprolide Acetate (ELIGARD [place]) Inject into the skin.    [provider]  levothyroxine (SYNTHROID) 50 MCG tablet Take 50 mcg by mouth daily before breakfast. 11/26/19   [provider]  Misc Natural Products (OSTEO BI-FLEX ADV JOINT SHIELD PO) Take 1 tablet by mouth daily.     [provider]  Multiple Vitamin (MULTIVITAMIN WITH MINERALS) TABS tablet Take 1 tablet by mouth daily. One-A-Day Men's 50+    [provider]  polyethylene glycol (MIRALAX / GLYCOLAX) 17 g packet Take 17 g by mouth daily. W/coffee    [provider]  Probiotic Product (ALIGN) 4 MG CAPS Take 4 mg by mouth daily.     [provider]  valsartan (DIOVAN) 320 MG tablet Take 160 mg by mouth 2 (two) times daily. 11/18/19   [provider]  Vibegron 75 MG TABS Take 75 mg by mouth daily. 05/19/21   Billey Co, MD    Allergies Metoprolol and Rivaroxaban  Family History  Problem Relation Age of Onset   Stroke Mother    Heart attack Father    Leukemia Sister 17   Lung cancer Brother 16    Social History Social History   Tobacco Use   Smoking status: Former    Pack years: 0.00   Smokeless tobacco: Never   Tobacco comments:    quit 50 years ago  Vaping Use   Vaping Use: Never used  Substance Use Topics   Alcohol use: Yes    Comment: rarely   Drug use: Never    Review of Systems Constitutional: No fever/chills Eyes: No visual changes. ENT: No sore throat. Cardiovascular: Denies chest pain. Respiratory: Denies shortness of breath. Gastrointestinal: No abdominal pain.  No nausea, no vomiting.  No diarrhea.  Positive constipation. Musculoskeletal: Negative for musculoskeletal pain. Skin: Negative for rash. Neurological: Negative for headaches, focal weakness or numbness.  ____________________________________________   PHYSICAL EXAM:  VITAL SIGNS: ED Triage Vitals  Enc Vitals Group     BP 06/13/21 1257 139/85     Pulse Rate 06/13/21 1257 (!) 52     Resp 06/13/21 1257 18     Temp 06/13/21 1257 98.7 F (37.1 C)     Temp Source 06/13/21 1257 Oral     SpO2 06/13/21 1257 100 %     Weight 06/13/21 1258 233 lb (105.7 kg)     Height 06/13/21 1258 _0  (1.778 m)     Head Circumference --      Peak Flow --      Pain Score 06/13/21 1258 0      Pain Loc --      Pain Edu? --      Excl. in [place]? --     Constitutional: Alert and oriented. Well appearing  and in no acute distress. Eyes: Conjunctivae are normal.  Head: Atraumatic. Neck: No stridor.   Cardiovascular: Normal rate, regular rhythm. Grossly normal heart sounds.  Good peripheral circulation. Respiratory: Normal respiratory effort.  No retractions. Lungs CTAB. Gastrointestinal: Soft and nontender.  Mildly distended.  Bowel sounds are noted throughout. Musculoskeletal: No lower extremity tenderness nor edema.  Neurologic:  Normal speech and language. No gross focal neurologic deficits are appreciated.  Skin:  Skin is warm, dry and intact. No rash noted. Psychiatric: Mood and affect are normal. Speech and behavior are normal.  ____________________________________________   LABS (all labs ordered are listed, but only abnormal results are displayed)  Labs Reviewed - No data to display ____________________________________________  ____________________________________________  RADIOLOGY Leana Gamer, personally viewed and evaluated these images (plain radiographs) as part of my medical decision making, as well as reviewing the written report by the radiologist.   Official radiology report(s): DG Abdomen 1 View  Result Date: 06/13/2021 CLINICAL DATA:  Constipation. EXAM: ABDOMEN - 1 VIEW COMPARISON:  None. FINDINGS: 7 mm gallstone identified. No pathologic dilatation of the large or small bowel loops. A moderate stool burden is identified throughout the colon which may reflect underlying constipation. IMPRESSION: 1. Nonobstructive bowel gas pattern. 2. Moderate stool burden throughout the colon may reflect underlying constipation. 3. Cholelithiasis. Electronically Signed   By: Kerby Moors M.D.   On: 06/13/2021 13:31    ____________________________________________   PROCEDURES  Procedure(s) performed (including Critical  Care):  Procedures   ____________________________________________   INITIAL IMPRESSION / ASSESSMENT AND PLAN / ED COURSE  As part of my medical decision making, I reviewed the following data within the electronic MEDICAL RECORD NUMBER Notes from prior ED visits and [place] Controlled Substance Database  87 year old male with chronic history of constipation presents to the ED with his 3 of the same for the last several months but worse in the last couple of days.  Has been taking MiraLAX daily with his coffee but has not been drinking any fluids behind it and reportedly does not drink fluids during the day.  We discussed the need for drinking at least 80 ounces of water after drinking his coffee and continue to drink fluids.  The initial enema that was given patient did not retain.  He was given another soapsuds enema and did have a small bowel movement.  ____________________________________________   FINAL CLINICAL IMPRESSION(S) / ED DIAGNOSES  Final diagnoses:  Constipation, unspecified constipation type     ED Discharge Orders     None        Note:  This document was prepared using Dragon voice recognition software and may include unintentional dictation errors.    Johnn Hai, PA-C 06/13/21 1645    Vladimir Crofts, MD 06/14/21 [phone]

## 2021-06-13 NOTE — ED Notes (Addendum)
Administered mineral oil enema per PA Madalyn Rob

## 2021-06-13 NOTE — ED Triage Notes (Signed)
Pt here with here severe constipation for about a week. 2 enemas given by wife at bedside.

## 2021-06-13 NOTE — Discharge Instructions (Addendum)
Call and make an appointment with Dr. Sabra Heck if any continued problems with constipation.  Remember that when taking MiraLAX with your coffee you will need to drink at least 8 ounces of fluid immediately after drinking her coffee and continue to drink fluids during the day.  Increase foods that are high in fiber such as vegetables and fruits.

## 2021-06-13 NOTE — ED Notes (Signed)
Pt placed on bedpan, one small soft stool.

## 2021-06-16 ENCOUNTER — Telehealth: Payer: Medicare Other | Admitting: Urology

## 2021-06-20 ENCOUNTER — Other Ambulatory Visit: Payer: Self-pay | Admitting: Oncology

## 2021-06-20 DIAGNOSIS — C61 Malignant neoplasm of prostate: Secondary | ICD-10-CM

## 2021-06-22 ENCOUNTER — Other Ambulatory Visit: Payer: Self-pay

## 2021-06-22 ENCOUNTER — Telehealth (INDEPENDENT_AMBULATORY_CARE_PROVIDER_SITE_OTHER): Payer: Medicare Other | Admitting: Urology

## 2021-06-22 DIAGNOSIS — N401 Enlarged prostate with lower urinary tract symptoms: Secondary | ICD-10-CM | POA: Diagnosis not present

## 2021-06-22 DIAGNOSIS — N3281 Overactive bladder: Secondary | ICD-10-CM

## 2021-06-22 DIAGNOSIS — N138 Other obstructive and reflux uropathy: Secondary | ICD-10-CM | POA: Diagnosis not present

## 2021-06-22 NOTE — Progress Notes (Signed)
Virtual Visit via Telephone Note  I connected with Andrew Callahan on 06/22/21 at  3:15 PM EDT by telephone and verified that I am speaking with the correct person using two identifiers.   Patient location: Home Provider location: [facility] Urologic Office   I discussed the limitations, risks, security and privacy concerns of performing an evaluation and management service by telephone and the availability of in person appointments. We discussed the impact of the COVID-19 pandemic on the healthcare system, and the importance of social distancing and reducing patient and provider exposure. I also discussed with the patient that there may be a patient responsible charge related to this service. The patient expressed understanding and agreed to proceed.  Reason for visit: Metastatic prostate cancer, urinary frequency, urgency, nocturia  HPI: Briefly, 87 year old male with history of stroke and metastatic prostate cancer who previously presented with urinary retention and elevated PVRs. He underwent an uncomplicated HOLEP on 03/17/6545.  He has been voiding with a very strong stream since surgery, but continues to have persistent bothersome overactive symptoms including urgency, frequency, and nocturia.  He is compliant with his sleep apnea machine.  His metastatic prostate cancer is managed by oncology, and most recent PSA is undetectable.  He is not having any incontinence during the day.  His primary issue is nocturia 4X per night, which is unchanged from prior to surgery.   He had a cystoscopy in November 2021 that showed expected postop HOLEP findings of a wide open prostatic fossa and verumontanum and sphincter were intact and bladder was normal appearing.   We have tried numerous medications for his overactive symptoms and nocturia including Myrbetriq, vibegron, trospium, and even desmopressin.  He has not noticed significant improvement on any of these medications.  He completed a voiding diary,  and is voiding 5-7 times during the day, and 3-4 times overnight.  He is compliant with sleep apnea machine.   We discussed the complexities of managing nocturia, especially in his comorbidities including metastatic prostate cancer and history of stroke.    They are interested in pursuing PTNS.  Risks and benefits discussed.  He is also still consuming a fair amount of tea, and I recommended cutting back on this and see if his urinary symptoms improve.  We will schedule PTNS     I discussed the assessment and treatment plan with the patient. The patient was provided an opportunity to ask questions and all were answered. The patient agreed with the plan and demonstrated an understanding of the instructions.   The patient was advised to call back or seek an in-person evaluation if the symptoms worsen or if the condition fails to improve as anticipated.  I provided 8 minutes of non-face-to-face time during this encounter.   Billey Co, MD

## 2021-06-23 ENCOUNTER — Encounter: Payer: Self-pay | Admitting: Oncology

## 2021-07-07 IMAGING — CT CT ABD-PEL WO/W CM
2 of 9 series · 12 of 46 positions shown, 18 images · IV contrast (omnipaque)
Comparison: MR prostate 11/20/2019.

CLINICAL DATA: Prostate cancer.  Elevated PSA.

EXAM:
CT ABDOMEN AND PELVIS WITHOUT AND WITH CONTRAST
TECHNIQUE: Multidetector CT imaging of the abdomen and pelvis was performed
following the standard protocol before and following the bolus
administration of intravenous contrast.
CONTRAST:  100mL OMNIPAQUE IOHEXOL 300 MG/ML  SOLN

[Series 2: abd/pel pre · axial · non-contrast · 0.91mm/px · z∈[-1053,-593]mm · 9 of 116 slices shown, 15 images]
[im 12/116  soft-tissue]
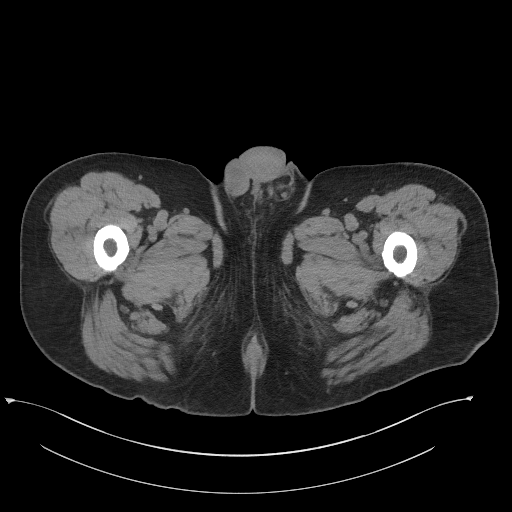
[im 12/116  bone]
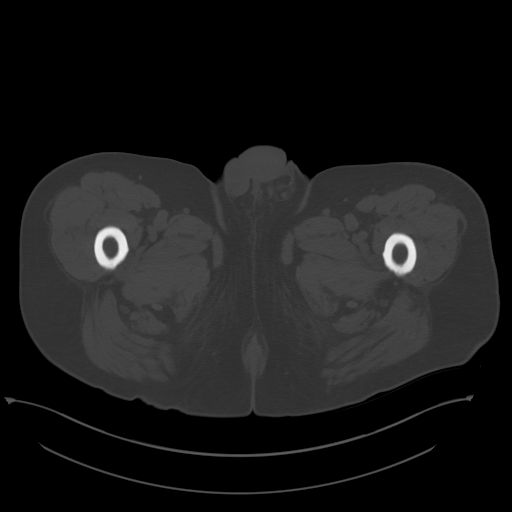
[im 24/116  soft-tissue]
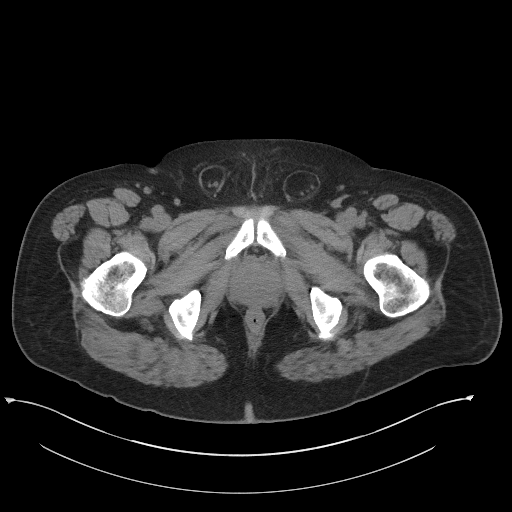
[im 35/116  soft-tissue]
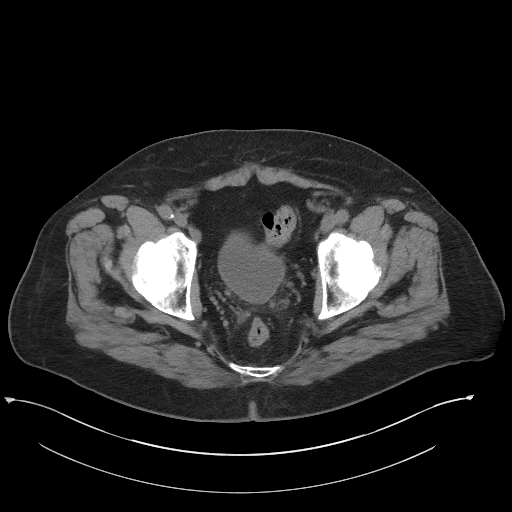
[im 47/116  soft-tissue]
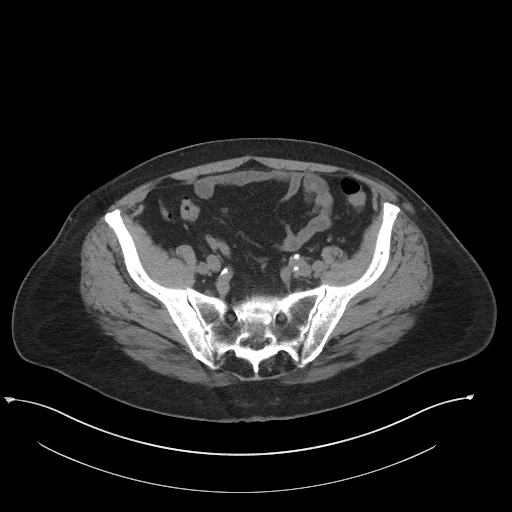
[im 58/116  soft-tissue]
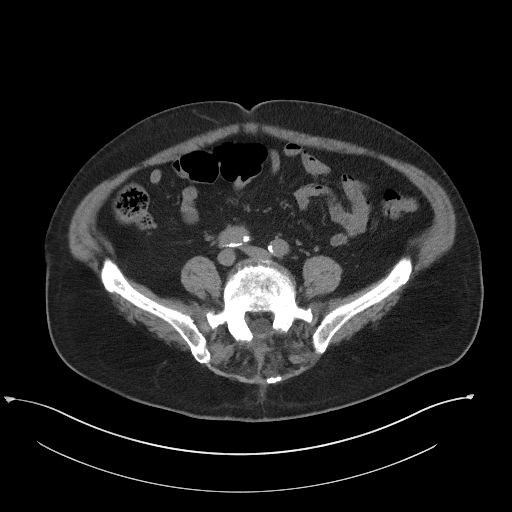
[im 70/116  soft-tissue]
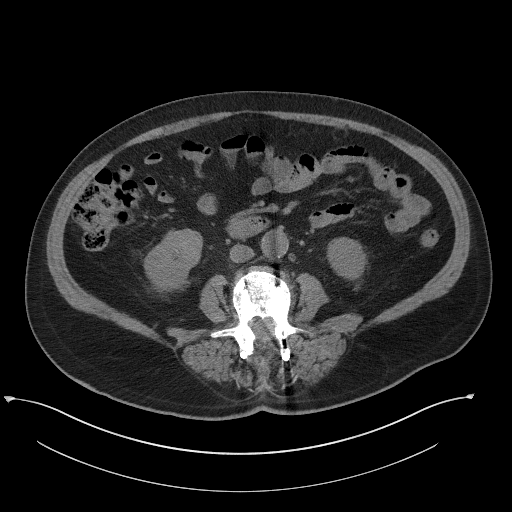
[im 70/116  lung]
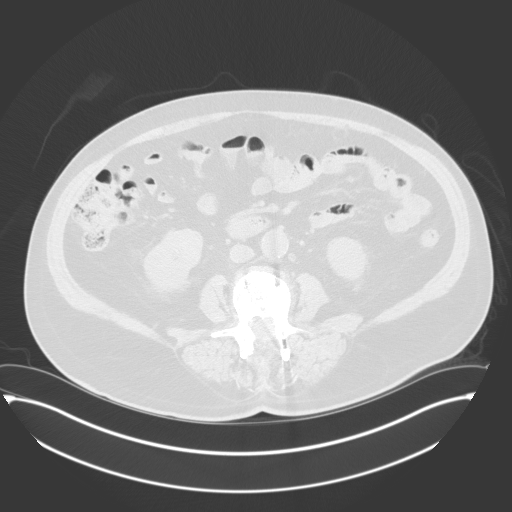
[im 81/116  soft-tissue]
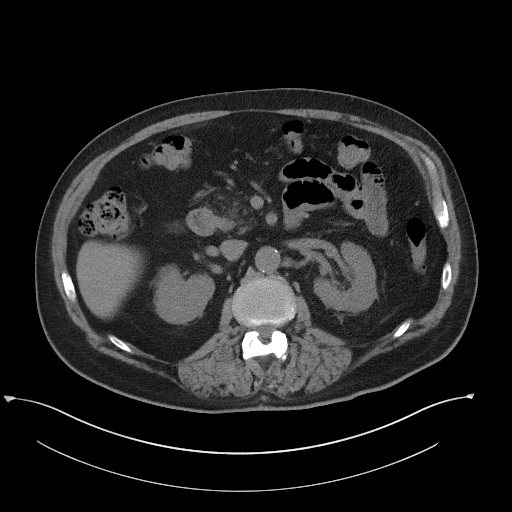
[im 81/116  lung]
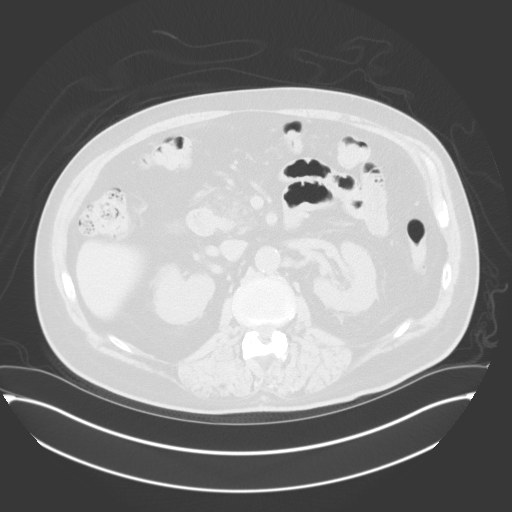
[im 93/116  soft-tissue]
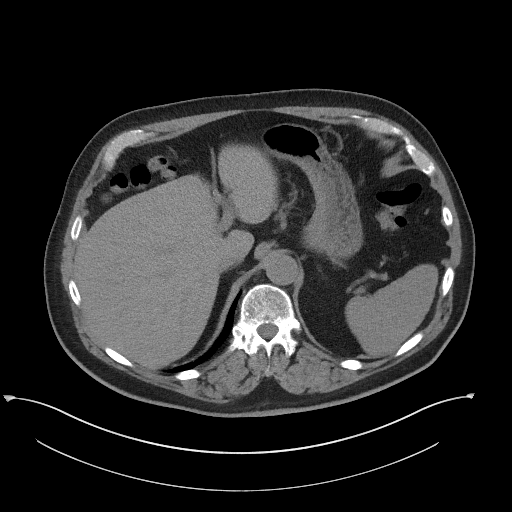
[im 93/116  lung]
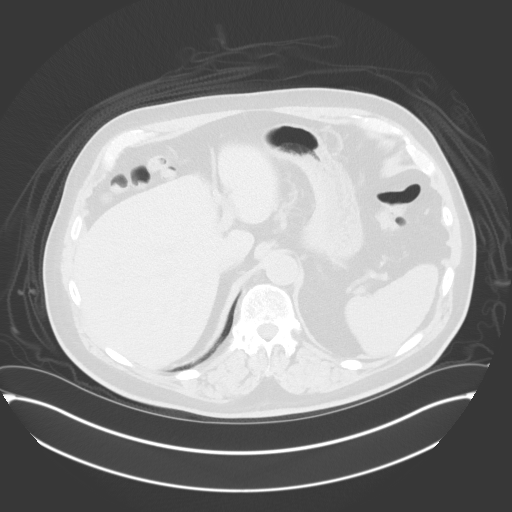
[im 104/116  soft-tissue]
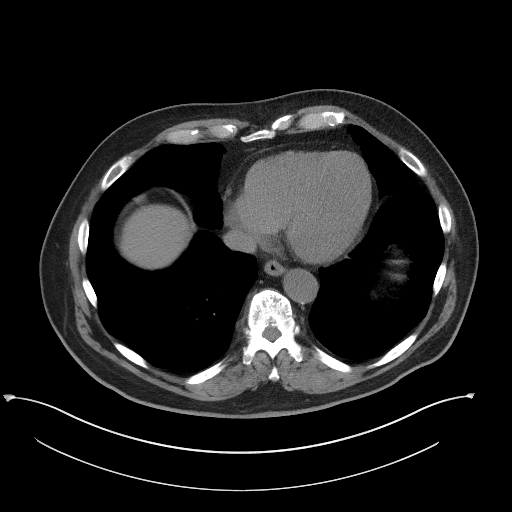
[im 104/116  lung]
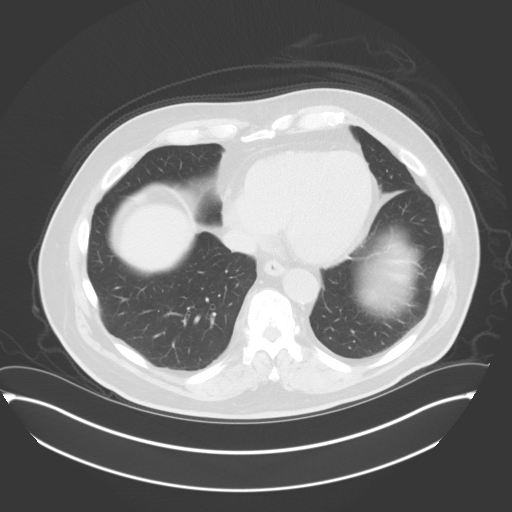
[im 104/116  bone]
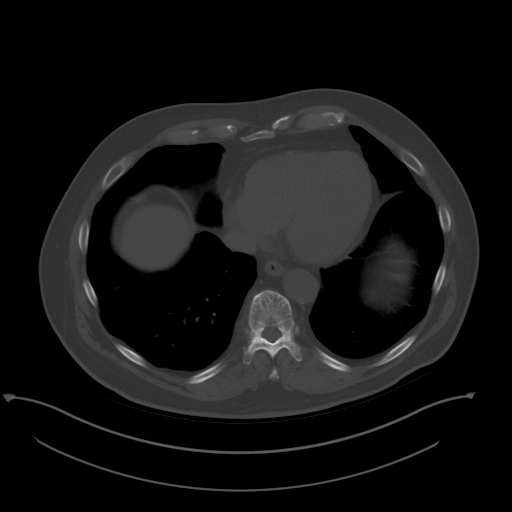

[Series 5: coronal st · coronal · 0.89mm/px · 3 of 103 slices shown]
[im 26/103  soft-tissue]
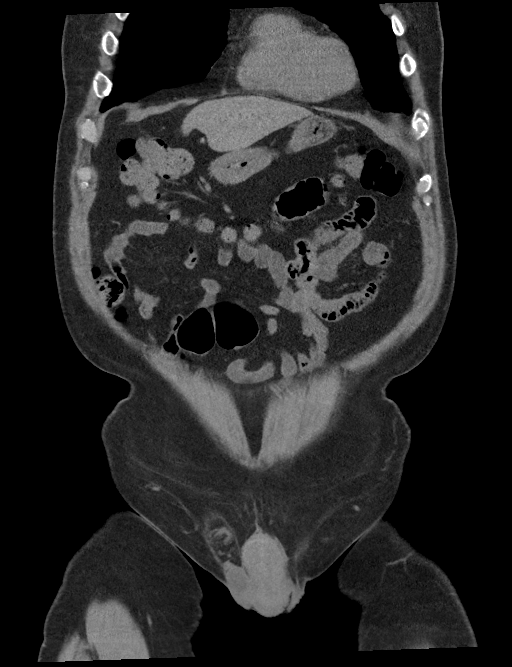
[im 52/103  soft-tissue]
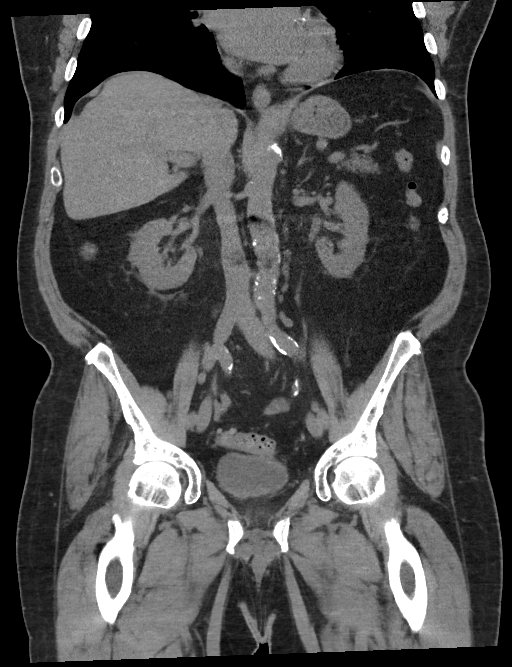
[im 77/103  soft-tissue]
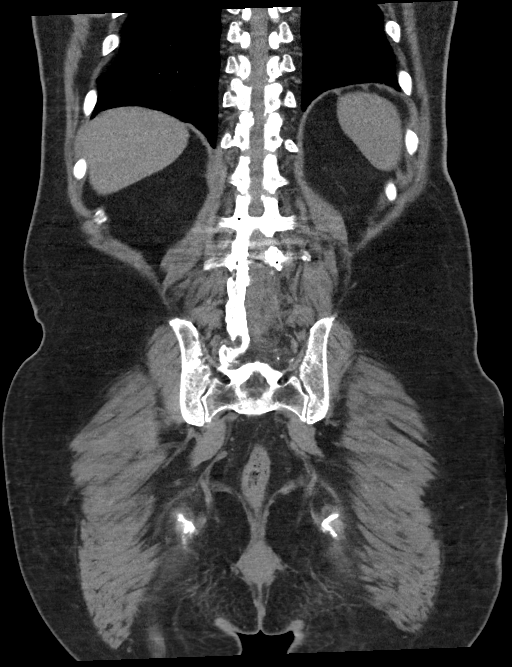

[12 of 46 positions shown; findings below may reference images not displayed]

FINDINGS: Lower chest: 4 mm subpleural left lower lobe nodule ([DATE]),
nonspecific and possibly a subpleural lymph node. Lung bases are
otherwise clear. Heart size normal. Coronary artery calcification.
No pericardial or pleural effusion. Distal esophagus is
unremarkable.

Hepatobiliary: Liver is unremarkable. A stone is seen in the
gallbladder. No biliary ductal dilatation.

Pancreas: Negative.

Spleen: Subcentimeter low-attenuation lesions in the spleen are too
small to characterize.

Adrenals/Urinary Tract: Adrenal glands and kidneys are unremarkable.
There may be small renal sinus cysts on the left. Ureters are
decompressed. Bladder is grossly unremarkable.

Stomach/Bowel: Stomach, small bowel, appendix and colon are
unremarkable.

Vascular/Lymphatic: Atherosclerotic calcification of the aorta
without aneurysm. Scattered lymph nodes measure up to 8 mm in the
left external iliac station (7/84) and are not considered enlarged
by CT size criteria.

Reproductive: Prostate is mildly enlarged.

Other: Small bilateral inguinal hernias contain fat. No free fluid.
Mesenteries and peritoneum are unremarkable.

Musculoskeletal: Rounded sclerotic lesions are seen in T11, L1 and
L5. Denser sclerotic lesions are seen in L4, medial right iliac wing
and left acetabular roof
IMPRESSION: 1. Osseous metastatic disease involving T11, L1 and L5. Additional
sclerotic lesions in L4, the medial right iliac wing and left
acetabular roof are indeterminate.
2. Mild prostate enlargement.
3. 4 mm subpleural left lower lobe nodule, nonspecific and likely a
subpleural lymph node. Continued attention on follow-up is
recommended.
4. Cholelithiasis.
5. Aortic atherosclerosis ([id number]). Coronary artery
calcification.

## 2021-07-21 ENCOUNTER — Telehealth: Payer: Self-pay

## 2021-07-21 NOTE — Telephone Encounter (Signed)
Spoke with Marnee Spring at [place], No PA required for PTNS CPT [phone]. Called patient to get PTNS scheduled, LMOM for patient to return call.

## 2021-07-22 NOTE — Telephone Encounter (Signed)
Pt's wife called office to schedule Pt's PTNS treatments.

## 2021-07-25 NOTE — Telephone Encounter (Signed)
Spoke with Patient's wife, PTNS scheduled

## 2021-07-27 NOTE — Telephone Encounter (Signed)
Yes, would recommend PTNS.  He does not need to keep a voiding diary.   Would strongly recommend the PTNS before Botox, as Botox has a risk of working so well that the bladder cannot squeeze and patients go into retention and need a catheter, which I do not think he would do very well with.  [name], MD 07/27/2021

## 2021-07-29 ENCOUNTER — Other Ambulatory Visit: Payer: Self-pay

## 2021-07-29 ENCOUNTER — Ambulatory Visit (INDEPENDENT_AMBULATORY_CARE_PROVIDER_SITE_OTHER): Payer: Medicare Other | Admitting: *Deleted

## 2021-07-29 DIAGNOSIS — N3281 Overactive bladder: Secondary | ICD-10-CM | POA: Diagnosis not present

## 2021-07-29 NOTE — Patient Instructions (Signed)
Tracking Your Bladder Symptoms    Patient Name:___________________________________________________   Sample: Day   Daytime Voids  Nighttime Voids Urgency for the Day(0-4) Number of Accidents Beverage Comments  Monday IIII II 2 I Water IIII Coffee  I      Week Starting:____________________________________   Day Daytime  Voids Nighttime  Voids Urgency for  The Day(0-4) Number of Accidents Beverages Comments                                                           This week my symptoms were:  O much better  O better O the same O worse   

## 2021-07-29 NOTE — Progress Notes (Signed)
PTNS  Session # 1 of 45  Health & Social Factors: no change Caffeine: 1 Alcohol: 0 Daytime voids #per day: 7 Night-time voids #per night: 4-5  Urgency: 0 Incontinence Episodes #per day: 0 Ankle used: right  Treatment Setting: 8 Feeling/ Response: toe flex Comments:   Performed By: Gaspar Cola CMA    Follow Up: 1 week watched video and sign form .

## 2021-08-05 ENCOUNTER — Ambulatory Visit (INDEPENDENT_AMBULATORY_CARE_PROVIDER_SITE_OTHER): Payer: Medicare Other | Admitting: *Deleted

## 2021-08-05 ENCOUNTER — Other Ambulatory Visit: Payer: Self-pay

## 2021-08-05 DIAGNOSIS — N3281 Overactive bladder: Secondary | ICD-10-CM

## 2021-08-05 NOTE — Progress Notes (Signed)
PTNS  Session # 2 of 45  Health & Social Factors: No change Caffeine: 1 Alcohol: 0 Daytime voids #per day: 8 Night-time voids #per night: 5 Urgency: low Incontinence Episodes #per day: 0 Ankle used: Left Treatment Setting: 5 Feeling/ Response: Toe flex  Comments: Tolerated well  Performed By: Verlene Mayer, CMA   Follow Up: 1 week as scheduled

## 2021-08-05 NOTE — Patient Instructions (Signed)
Tracking Your Bladder Symptoms    Patient Name:___________________________________________________   Sample: Day   Daytime Voids  Nighttime Voids Urgency for the Day(0-4) Number of Accidents Beverage Comments  Monday IIII II 2 I Water IIII Coffee  I      Week Starting:____________________________________   Day Daytime  Voids Nighttime  Voids Urgency for  The Day(0-4) Number of Accidents Beverages Comments                                                           This week my symptoms were:  O much better  O better O the same O worse   

## 2021-08-12 ENCOUNTER — Other Ambulatory Visit: Payer: Self-pay

## 2021-08-12 ENCOUNTER — Ambulatory Visit: Payer: Medicare Other | Admitting: *Deleted

## 2021-08-12 DIAGNOSIS — N3281 Overactive bladder: Secondary | ICD-10-CM

## 2021-08-12 NOTE — Patient Instructions (Signed)
Tracking Your Bladder Symptoms    Patient Name:___________________________________________________   Sample: Day   Daytime Voids  Nighttime Voids Urgency for the Day(0-4) Number of Accidents Beverage Comments  Monday IIII II 2 I Water IIII Coffee  I      Week Starting:____________________________________   Day Daytime  Voids Nighttime  Voids Urgency for  The Day(0-4) Number of Accidents Beverages Comments                                                           This week my symptoms were:  O much better  O better O the same O worse   

## 2021-08-12 NOTE — Progress Notes (Signed)
PTNS   Session # 3 of 45   Health & Social Factors: No change Caffeine: 1 Alcohol: 0 Daytime voids #per day: 8 Night-time voids #per night: 5 Urgency: low Incontinence Episodes #per day: 0 Ankle used: right Treatment Setting: 5 Feeling/ Response: Toe flex  Comments: Tolerated well   Performed By: Gaspar Cola CMA      Follow Up: 1 week as scheduled

## 2021-08-19 ENCOUNTER — Other Ambulatory Visit: Payer: Self-pay

## 2021-08-19 ENCOUNTER — Ambulatory Visit (INDEPENDENT_AMBULATORY_CARE_PROVIDER_SITE_OTHER): Payer: Medicare Other | Admitting: *Deleted

## 2021-08-19 DIAGNOSIS — N3281 Overactive bladder: Secondary | ICD-10-CM

## 2021-08-19 NOTE — Progress Notes (Signed)
Patient ID: Andrew Callahan, male   DOB: [birth year], 87 y.o.   MRN: [id number] PTNS  Session # 4 of 45  Health & Social Factors: no change Caffeine: 1 Alcohol: 0 Daytime voids #per day: 7-8 Night-time voids #per night: 4-5 Urgency: strong Incontinence Episodes #per day: 0 Ankle used: left Treatment Setting: 11 Feeling/ Response: sensory Comments: pt tolerated well  Performed By: Edwin Dada, CMA   Follow Up: 1 week for #5 of 45

## 2021-08-19 NOTE — Patient Instructions (Signed)
Tracking Your Bladder Symptoms    Patient Name:___________________________________________________   Sample: Day   Daytime Voids  Nighttime Voids Urgency for the Day(0-4) Number of Accidents Beverage Comments  Monday IIII II 2 I Water IIII Coffee  I      Week Starting:____________________________________   Day Daytime  Voids Nighttime  Voids Urgency for  The Day(0-4) Number of Accidents Beverages Comments                                                           This week my symptoms were:  O much better  O better O the same O worse   

## 2021-08-26 ENCOUNTER — Ambulatory Visit (INDEPENDENT_AMBULATORY_CARE_PROVIDER_SITE_OTHER): Payer: Medicare Other

## 2021-08-26 ENCOUNTER — Other Ambulatory Visit: Payer: Self-pay

## 2021-08-26 DIAGNOSIS — N3281 Overactive bladder: Secondary | ICD-10-CM

## 2021-08-26 NOTE — Patient Instructions (Signed)
Tracking Your Bladder Symptoms    Patient Name:___________________________________________________   Sample: Day   Daytime Voids  Nighttime Voids Urgency for the Day(0-4) Number of Accidents Beverage Comments  Monday IIII II 2 I Water IIII Coffee  I      Week Starting:____________________________________   Day Daytime  Voids Nighttime  Voids Urgency for  The Day(0-4) Number of Accidents Beverages Comments                                                           This week my symptoms were:  O much better  O better O the same O worse   

## 2021-08-26 NOTE — Progress Notes (Signed)
PTNS  Session # 5 of 45   Health & Social Factors: no changes Caffeine: 1 Alcohol: 0 Daytime voids #per day: 8 Night-time voids #per night: 5  Urgency: mild Incontinence Episodes #per day: 0  Ankle used: right Treatment Setting: 7 Feeling/ Response: toe flex, mild sensory Comments: pt tolerated well  Performed By: Kerman Passey, RMA   Follow Up: 1 week

## 2021-08-29 NOTE — Progress Notes (Signed)
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  INTERVAL HISTORY: Patient returns to clinic today for for repeat laboratory work, further evaluation, and continuation of [name].  He continues to have chronic weakness and fatigue.  He also has chronic frequency and nocturia.  He has no neurologic complaints.  He denies any recent fevers or illnesses.  He has a good appetite and denies weight loss.  He has no chest pain, shortness of breath, cough, or hemoptysis.  He denies any nausea, vomiting, constipation, or diarrhea.  He has no other urologic complaints.  Patient offers no further specific complaints today.  REVIEW OF SYSTEMS:   Review of Systems  Constitutional:  Positive for malaise/fatigue. Negative for fever and weight loss.  Respiratory: Negative.  Negative for cough and hemoptysis.   Cardiovascular: Negative.  Negative for chest pain and leg swelling.  Gastrointestinal: Negative.  Negative for abdominal pain.  Genitourinary:  Positive for frequency. Negative for hematuria.  Musculoskeletal: Negative.  Negative for back pain.  Skin: Negative.  Negative for rash.  Neurological:  Positive for weakness. Negative for dizziness, sensory change, focal weakness and headaches.  Psychiatric/Behavioral: Negative.  The patient is not nervous/anxious.    As per HPI. Otherwise, a complete review of systems is negative.  PAST MEDICAL HISTORY: Past Medical History:  Diagnosis Date   Acquired hypothyroidism    Aortic atherosclerosis (HCC)    Atrial fibrillation (HCC)    Chronic constipation    CVA (cerebral vascular accident) ([place])    DDD (degenerative disc  disease), cervical    Diabetes mellitus without complication ([place])    Diverticulosis    Elevated prostate specific antigen (PSA)    Family history of leukemia    Family history of lung cancer    History of kidney stones    History of pituitary adenoma    Hyperlipidemia    Hypertension    Hypogonadism in male    Hypothyroid    Hypothyroidism    IDA (iron deficiency anemia)    Lumbar disc disease    Nephrolithiasis    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 Mohs.   Stroke ([place])    throat / slight difficulty swallowing    PAST SURGICAL HISTORY: Past Surgical History:  Procedure Laterality Date   BACK SURGERY  1997   L3, 4, 5 laminectomy    BACK SURGERY  1998   hard wire removed, fusion of L3-5   BACK SURGERY  2009   laminotomy/foraminotomy w/ decompression of nerve root, facet thermal ablation L2-3.    COLONOSCOPY     COLONOSCOPY WITH PROPOFOL N/A 02/04/2021   Procedure: COLONOSCOPY WITH PROPOFOL;  Surgeon: Lesly Rubenstein, MD;  Location: [facility] ENDOSCOPY;  Service: Endoscopy;  Laterality: N/A;  STAT CBC BEFORE PROCEDURE   ESOPHAGOGASTRODUODENOSCOPY (EGD) WITH PROPOFOL N/A 02/04/2021   Procedure: ESOPHAGOGASTRODUODENOSCOPY (EGD) WITH PROPOFOL;  Surgeon: Lesly Rubenstein, MD;  Location: [facility] ENDOSCOPY;  Service: Endoscopy;  Laterality: N/A;   HERNIA REPAIR     HOLEP-LASER  ENUCLEATION OF THE PROSTATE WITH MORCELLATION N/A 06/18/2020   Procedure: HOLEP-LASER ENUCLEATION OF THE PROSTATE WITH MORCELLATION;  Surgeon: Billey Co, MD;  Location: [facility] ORS;  Service: Urology;  Laterality: N/A;   JOINT REPLACEMENT     left knee partial   KIDNEY STONE SURGERY  2010   KNEE SURGERY Left 2007   Arthroscopic partial knee replacement, replaced medial side    MOHS SURGERY Right 2014   squamous  cell carcinoma   NECK SURGERY  2008   fusion with hardwire in place C3 through C7    pituitary adenoma  1994   benign   PROSTATE BIOPSY N/A 01/01/2020   Procedure: PROSTATE BIOPSY URO NAV FUSION;  Surgeon: Royston Cowper, MD;  Location: [facility] ORS;  Service: Urology;  Laterality: N/A;   [place] SURGERY  2016   fusion L4    TONSILLECTOMY      FAMILY HISTORY: Family History  Problem Relation Age of Onset   Stroke Mother    Heart attack Father    Leukemia Sister 81   Lung cancer Brother 5    ADVANCED DIRECTIVES (Y/N):  N  HEALTH MAINTENANCE: Social History   Tobacco Use   Smoking status: Former   Smokeless tobacco: Never   Tobacco comments:    quit 50 years ago  Vaping Use   Vaping Use: Never used  Substance Use Topics   Alcohol use: Yes    Comment: rarely   Drug use: Never     Colonoscopy:  PAP:  Bone density:  Lipid panel:  Allergies  Allergen Reactions   Metoprolol Other (See Comments)    dizziness    Rivaroxaban Other (See Comments)    hematuria     Current Outpatient Medications  Medication Sig Dispense Refill   ACCU-CHEK GUIDE test strip daily. use as directed     Accu-Chek Softclix Lancets lancets daily.     B Complex-C (SUPER B COMPLEX PO) Take 1 capsule by mouth daily.      Blood Glucose Monitoring Suppl (ACCU-CHEK GUIDE) w/Device KIT See admin instructions.     Calcium-Magnesium-Vitamin D (CALCIUM 1200+D3 PO) Take 2 tablets by mouth daily.     cetirizine (ZYRTEC) 10 MG tablet Take 10 mg by mouth at bedtime.     denosumab (XGEVA) 120 MG/1.7ML SOLN injection Inject 120 mg into the skin every 3 (three) months.     ELIQUIS 5 MG TABS tablet Take 5 mg by mouth 2 (two) times daily.     glipiZIDE (GLUCOTROL XL) 10 MG 24 hr tablet Take 10 mg by mouth daily.     hydrocortisone (CORTEF) 10 MG tablet Take 10 mg by mouth 2 (two) times daily.      Iron, Ferrous Sulfate, 325 (65 Fe) MG TABS daily.     Leuprolide Acetate (ELIGARD [place]) Inject  into the skin.     levothyroxine (SYNTHROID) 50 MCG tablet Take 50 mcg by mouth daily before breakfast.     Misc Natural Products (OSTEO BI-FLEX ADV JOINT SHIELD PO) Take 1 tablet by mouth daily.      polyethylene glycol (MIRALAX / GLYCOLAX) 17 g packet Take 17 g by mouth daily. W/coffee     Probiotic Product (ALIGN) 4 MG CAPS Take 4 mg by mouth daily.      valsartan (DIOVAN) 320 MG tablet Take 160 mg by mouth 2 (two) times daily.     abiraterone acetate (ZYTIGA) 250 MG tablet Take 3 tablets (750 mg total)  by mouth daily. Take on an empty stomach 1 hour before or 2 hours after a meal 90 tablet 1   furosemide (LASIX) 40 MG tablet Take 40 mg by mouth daily.     Multiple Vitamin (MULTIVITAMIN WITH MINERALS) TABS tablet Take 1 tablet by mouth daily. One-A-Day Men's 50+     Vibegron 75 MG TABS Take 75 mg by mouth daily. 30 tablet 11   No current facility-administered medications for this visit.    OBJECTIVE: Vitals:   09/01/21 1050  BP: (!) 155/78  Pulse: 82  Resp: 18  Temp: 97.9 F (36.6 C)  SpO2: 96%     Body mass index is 32.14 kg/m.    ECOG FS:1 - Symptomatic but completely ambulatory  General: Well-developed, well-nourished, no acute distress.  Sitting in a wheelchair. Eyes: Pink conjunctiva, anicteric sclera. HEENT: Normocephalic, moist mucous membranes. Lungs: No audible wheezing or coughing. Heart: Regular rate and rhythm. Abdomen: Soft, nontender, no obvious distention. Musculoskeletal: No edema, cyanosis, or clubbing. Neuro: Alert, answering all questions appropriately. Cranial nerves grossly intact. Skin: No rashes or petechiae noted. Psych: Normal affect.   LAB RESULTS:  Lab Results  Component Value Date   NA 139 09/01/2021   K 3.7 09/01/2021   CL 107 09/01/2021   CO2 23 09/01/2021   GLUCOSE 158 (H) 09/01/2021   BUN 16 09/01/2021   CREATININE 0.96 09/01/2021   CALCIUM 8.6 (L) 09/01/2021   PROT 6.4 (L) 09/01/2021   ALBUMIN 3.6 09/01/2021   AST 30 09/01/2021    ALT 13 09/01/2021   ALKPHOS 63 09/01/2021   BILITOT 0.3 09/01/2021   GFRNONAA >60 09/01/2021   GFRAA >60 09/01/2020    Lab Results  Component Value Date   WBC 7.5 09/01/2021   NEUTROABS 5.6 09/01/2021   HGB 12.2 (L) 09/01/2021   HCT 37.4 (L) 09/01/2021   MCV 88.2 09/01/2021   PLT 197 09/01/2021     STUDIES: No results found.  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  PLAN:    1. 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  Patient's PSA was initially greater than 8.0, but trended down and has been undetectable since June 2022.  Patient would like to dose reduce his Zytiga to 3 tabs per day.  Continue Xgeva every 3 months and Eligard every 6 months.  Proceed with Xgeva today.  Return to clinic in 3 months for further evaluation and continuation of Xgeva and Eligard. 2.  Bony metastasis: Continue Xgeva every 3 months as above.  Patient's most recent calcium levels are adequate to proceed as scheduled.   3.  Urinary frequency/hematuria: Continue evaluation and treatment per urology.  4.  Hot flashes: Patient does not complain of this today.  Likely second Eligard, monitor. 5.  Anemia: Recent colonoscopy and EGD did not reveal any significant pathology.  He last received 510 mg of IV Feraheme on February 25, 2021.  Hemoglobin is now nearly within normal limits. 6.  Weakness and fatigue: Multifactorial.  Monitor. 7.  Peripheral edema: Essentially resolved.  Follow-up with primary care and cardiology as indicated.  Patient expressed understanding and was in agreement with this plan. He also understands that He can call clinic at any time with any questions, concerns, or complaints.   Cancer Staging Prostate cancer [facility]) Staging form: Prostate, AJCC 8th Edition - Clinical stage from 02/17/2020: Stage IVB (cT2c, cN0, cM1b, PSA: 8.6, Grade Group: 5) - Signed  by  Lloyd Huger, MD on 02/17/2020 Prostate specific antigen (PSA) range: Less than 10 Histologic grading system: 5 grade system   Lloyd Huger, MD   09/03/2021 7:44 AM

## 2021-09-01 ENCOUNTER — Inpatient Hospital Stay (HOSPITAL_BASED_OUTPATIENT_CLINIC_OR_DEPARTMENT_OTHER): Payer: Medicare Other | Admitting: Oncology

## 2021-09-01 ENCOUNTER — Inpatient Hospital Stay: Payer: Medicare Other | Attending: Oncology

## 2021-09-01 ENCOUNTER — Inpatient Hospital Stay: Payer: Medicare Other

## 2021-09-01 ENCOUNTER — Other Ambulatory Visit: Payer: Self-pay | Admitting: Pharmacist

## 2021-09-01 VITALS — BP 155/78 | HR 82 | Temp 97.9°F | Resp 18 | Wt 224.0 lb

## 2021-09-01 DIAGNOSIS — C61 Malignant neoplasm of prostate: Secondary | ICD-10-CM

## 2021-09-01 DIAGNOSIS — Z79899 Other long term (current) drug therapy: Secondary | ICD-10-CM | POA: Insufficient documentation

## 2021-09-01 DIAGNOSIS — C7951 Secondary malignant neoplasm of bone: Secondary | ICD-10-CM | POA: Insufficient documentation

## 2021-09-01 DIAGNOSIS — D509 Iron deficiency anemia, unspecified: Secondary | ICD-10-CM

## 2021-09-01 LAB — CBC WITH DIFFERENTIAL/PLATELET
Abs Immature Granulocytes: 0.04 10*3/uL (ref 0.00–0.07)
Basophils Absolute: 0 10*3/uL (ref 0.0–0.1)
Basophils Relative: 1 %
Eosinophils Absolute: 0.1 10*3/uL (ref 0.0–0.5)
Eosinophils Relative: 1 %
HCT: 37.4 % — ABNORMAL LOW (ref 39.0–52.0)
Hemoglobin: 12.2 g/dL — ABNORMAL LOW (ref 13.0–17.0)
Immature Granulocytes: 1 %
Lymphocytes Relative: 17 %
Lymphs Abs: 1.3 10*3/uL (ref 0.7–4.0)
MCH: 28.8 pg (ref 26.0–34.0)
MCHC: 32.6 g/dL (ref 30.0–36.0)
MCV: 88.2 fL (ref 80.0–100.0)
Monocytes Absolute: 0.5 10*3/uL (ref 0.1–1.0)
Monocytes Relative: 7 %
Neutro Abs: 5.6 10*3/uL (ref 1.7–7.7)
Neutrophils Relative %: 73 %
Platelets: 197 10*3/uL (ref 150–400)
RBC: 4.24 MIL/uL (ref 4.22–5.81)
RDW: 15.4 % (ref 11.5–15.5)
WBC: 7.5 10*3/uL (ref 4.0–10.5)
nRBC: 0 % (ref 0.0–0.2)

## 2021-09-01 LAB — COMPREHENSIVE METABOLIC PANEL
ALT: 13 U/L (ref 0–44)
AST: 30 U/L (ref 15–41)
Albumin: 3.6 g/dL (ref 3.5–5.0)
Alkaline Phosphatase: 63 U/L (ref 38–126)
Anion gap: 9 (ref 5–15)
BUN: 16 mg/dL (ref 8–23)
CO2: 23 mmol/L (ref 22–32)
Calcium: 8.6 mg/dL — ABNORMAL LOW (ref 8.9–10.3)
Chloride: 107 mmol/L (ref 98–111)
Creatinine, Ser: 0.96 mg/dL (ref 0.61–1.24)
GFR, Estimated: >60 mL/min (ref >60)
Glucose, Bld: 158 mg/dL — ABNORMAL HIGH (ref 70–99)
Potassium: 3.7 mmol/L (ref 3.5–5.1)
Sodium: 139 mmol/L (ref 135–145)
Total Bilirubin: 0.3 mg/dL (ref 0.3–1.2)
Total Protein: 6.4 g/dL — ABNORMAL LOW (ref 6.5–8.1)

## 2021-09-01 LAB — IRON AND TIBC
Iron: 66 ug/dL (ref 45–182)
Saturation Ratios: 21 % (ref 17.9–39.5)
TIBC: 314 ug/dL (ref 250–450)
UIBC: 248 ug/dL

## 2021-09-01 LAB — FERRITIN: Ferritin: 18 ng/mL — ABNORMAL LOW (ref 24–336)

## 2021-09-01 LAB — PSA: Prostatic Specific Antigen: <0.01 ng/mL (ref 0.00–4.00)

## 2021-09-01 MED ORDER — DENOSUMAB 120 MG/1.7ML ~~LOC~~ SOLN
120.0000 mg | Freq: Once | SUBCUTANEOUS | Status: AC
  Administered 2021-09-01: 120 mg via SUBCUTANEOUS
  Filled 2021-09-01: qty 1.7

## 2021-09-01 MED ORDER — ABIRATERONE ACETATE 250 MG PO TABS: 750.0000 mg | ORAL_TABLET | Freq: Every day | ORAL | 1 refills | Status: DC

## 2021-09-01 NOTE — Progress Notes (Signed)
Pt scheduled for covid booster this afternoon. Concerned about interactions with xgeva. Wife has questions about lowering xtiga dose. Pt c/o constipation and q2hr urinary frequency while trying to sleep. Pt has ruptured vessel in left eye that happened 1.5 weeks ago. Pt wife reports it is improving.

## 2021-09-02 ENCOUNTER — Other Ambulatory Visit: Payer: Self-pay

## 2021-09-02 ENCOUNTER — Ambulatory Visit (INDEPENDENT_AMBULATORY_CARE_PROVIDER_SITE_OTHER): Payer: Medicare Other | Admitting: *Deleted

## 2021-09-02 DIAGNOSIS — N3281 Overactive bladder: Secondary | ICD-10-CM

## 2021-09-02 DIAGNOSIS — N401 Enlarged prostate with lower urinary tract symptoms: Secondary | ICD-10-CM

## 2021-09-02 DIAGNOSIS — N138 Other obstructive and reflux uropathy: Secondary | ICD-10-CM

## 2021-09-02 DIAGNOSIS — R3915 Urgency of urination: Secondary | ICD-10-CM

## 2021-09-02 NOTE — Progress Notes (Signed)
PTNS   Session #  6 of 45           Health & Social Factors: no changes Caffeine: 1 Alcohol: 0 Daytime voids #per day: 8 Night-time voids #per night: 5  Urgency: mild Incontinence Episodes #per day: 0     Ankle used: Left Treatment Setting: 18 Feeling/ Response: toe flex, mild sensory Comments: pt tolerated well   Performed VO:HYWVPX Rayyan Burley, CMA     Follow Up: 1 week

## 2021-09-02 NOTE — Patient Instructions (Signed)
Tracking Your Bladder Symptoms    Patient Name:___________________________________________________   Sample: Day   Daytime Voids  Nighttime Voids Urgency for the Day(0-4) Number of Accidents Beverage Comments  Monday IIII II 2 I Water IIII Coffee  I      Week Starting:____________________________________   Day Daytime  Voids Nighttime  Voids Urgency for  The Day(0-4) Number of Accidents Beverages Comments                                                           This week my symptoms were:  O much better  O better O the same O worse   

## 2021-09-03 ENCOUNTER — Encounter: Payer: Self-pay | Admitting: Oncology

## 2021-09-09 ENCOUNTER — Ambulatory Visit (INDEPENDENT_AMBULATORY_CARE_PROVIDER_SITE_OTHER): Payer: Medicare Other | Admitting: *Deleted

## 2021-09-09 ENCOUNTER — Other Ambulatory Visit: Payer: Self-pay

## 2021-09-09 DIAGNOSIS — N3281 Overactive bladder: Secondary | ICD-10-CM

## 2021-09-09 NOTE — Progress Notes (Signed)
Patient ID: Andrew Callahan, male   DOB: 28-Nov-1934, 87 y.o.   MRN: [id number] PTNS  Session # 7 of 45  Health & Social Factors: no change  Caffeine: 1 Alcohol: 0 Daytime voids #per day: 8 Night-time voids #per night: 3 Urgency: mild Incontinence Episodes #per day: 0 Ankle used: right Treatment Setting: 4 Feeling/ Response: both Comments: pt tolerated well  Performed By: Edwin Dada, CMA  Follow Up: 1 week

## 2021-09-09 NOTE — Patient Instructions (Signed)
Tracking Your Bladder Symptoms    Patient Name:___________________________________________________   Sample: Day   Daytime Voids  Nighttime Voids Urgency for the Day(0-4) Number of Accidents Beverage Comments  Monday IIII II 2 I Water IIII Coffee  I      Week Starting:____________________________________   Day Daytime  Voids Nighttime  Voids Urgency for  The Day(0-4) Number of Accidents Beverages Comments                                                           This week my symptoms were:  O much better  O better O the same O worse   

## 2021-09-16 ENCOUNTER — Ambulatory Visit (INDEPENDENT_AMBULATORY_CARE_PROVIDER_SITE_OTHER): Payer: Medicare Other

## 2021-09-16 ENCOUNTER — Other Ambulatory Visit: Payer: Self-pay

## 2021-09-16 DIAGNOSIS — R351 Nocturia: Secondary | ICD-10-CM | POA: Diagnosis not present

## 2021-09-16 DIAGNOSIS — R3915 Urgency of urination: Secondary | ICD-10-CM

## 2021-09-16 DIAGNOSIS — N3281 Overactive bladder: Secondary | ICD-10-CM

## 2021-09-16 NOTE — Patient Instructions (Signed)
Tracking Your Bladder Symptoms    Patient Name:___________________________________________________   Sample: Day   Daytime Voids  Nighttime Voids Urgency for the Day(0-4) Number of Accidents Beverage Comments  Monday IIII II 2 I Water IIII Coffee  I      Week Starting:____________________________________   Day Daytime  Voids Nighttime  Voids Urgency for  The Day(0-4) Number of Accidents Beverages Comments                                                           This week my symptoms were:  O much better  O better O the same O worse   

## 2021-09-16 NOTE — Progress Notes (Signed)
PTNS  Session # 8 of 45  Health & Social Factors: Numbness & tingling of foot & memory lost documented at 05/19/2021 encounter. Caffeine: 1 Alcohol: 0 Daytime voids #per day: 7 Night-time voids #per night: 5 Urgency: 0 day, 2 at night Incontinence Episodes #per day:0 Ankle used: Left Treatment Setting: 10 Feeling/ Response: Toe Flex Comments: Patient with neuropathy frustrated with his lack of sensation with PTNS, also patient with hx of stroke and memory loss, difficult historian as to improvement of symptoms.  Performed By: Gordy Clement, CMA   Follow Up: RTC in 1 week for PTNS

## 2021-09-23 ENCOUNTER — Other Ambulatory Visit: Payer: Self-pay

## 2021-09-23 ENCOUNTER — Ambulatory Visit (INDEPENDENT_AMBULATORY_CARE_PROVIDER_SITE_OTHER): Payer: Medicare Other

## 2021-09-23 DIAGNOSIS — N3281 Overactive bladder: Secondary | ICD-10-CM

## 2021-09-23 NOTE — Patient Instructions (Signed)
Tracking Your Bladder Symptoms    Patient Name:___________________________________________________   Sample: Day   Daytime Voids  Nighttime Voids Urgency for the Day(0-4) Number of Accidents Beverage Comments  Monday IIII II 2 I Water IIII Coffee  I      Week Starting:____________________________________   Day Daytime  Voids Nighttime  Voids Urgency for  The Day(0-4) Number of Accidents Beverages Comments                                                           This week my symptoms were:  O much better  O better O the same O worse   

## 2021-09-23 NOTE — Progress Notes (Signed)
PTNS  Session # 9 of 45  Health & Social Factors: no changes Caffeine: 1 1/2 coffees, 2 sodas Alcohol: 0 Daytime voids #per day: 6-7 Night-time voids #per night: 4-5 Urgency: only at night Incontinence Episodes #per day: 0 Ankle used: right Treatment Setting: 7 Feeling/ Response: toe flex and sensory Comments: pt tolerated well  Performed By: Kerman Passey, RMA  Follow Up: 1 week

## 2021-09-30 ENCOUNTER — Ambulatory Visit (INDEPENDENT_AMBULATORY_CARE_PROVIDER_SITE_OTHER): Payer: Medicare Other

## 2021-09-30 ENCOUNTER — Other Ambulatory Visit: Payer: Self-pay

## 2021-09-30 DIAGNOSIS — N3281 Overactive bladder: Secondary | ICD-10-CM | POA: Diagnosis not present

## 2021-09-30 NOTE — Progress Notes (Signed)
PTNS  Session # 10 of 45  Health & Social Factors: no changes Caffeine: 3 Alcohol: 0 Daytime voids #per day: 9 Night-time voids #per night: 4-5 Urgency: none Incontinence Episodes #per day: none Ankle used: left Treatment Setting: 9 Feeling/ Response: toe flex sensory Comments: pt tolerated well  Performed By: Kerman Passey, RMA  Follow Up: next week.

## 2021-09-30 NOTE — Patient Instructions (Signed)
Tracking Your Bladder Symptoms    Patient Name:___________________________________________________   Sample: Day   Daytime Voids  Nighttime Voids Urgency for the Day(0-4) Number of Accidents Beverage Comments  Monday IIII II 2 I Water IIII Coffee  I      Week Starting:____________________________________   Day Daytime  Voids Nighttime  Voids Urgency for  The Day(0-4) Number of Accidents Beverages Comments                                                           This week my symptoms were:  O much better  O better O the same O worse   

## 2021-10-07 ENCOUNTER — Other Ambulatory Visit: Payer: Self-pay

## 2021-10-07 ENCOUNTER — Ambulatory Visit (INDEPENDENT_AMBULATORY_CARE_PROVIDER_SITE_OTHER): Payer: Medicare Other | Admitting: Family Medicine

## 2021-10-07 DIAGNOSIS — N3281 Overactive bladder: Secondary | ICD-10-CM

## 2021-10-07 NOTE — Patient Instructions (Signed)
Tracking Your Bladder Symptoms    Patient Name:___________________________________________________   Sample: Day   Daytime Voids  Nighttime Voids Urgency for the Day(0-4) Number of Accidents Beverage Comments  Monday IIII II 2 I Water IIII Coffee  I      Week Starting:____________________________________   Day Daytime  Voids Nighttime  Voids Urgency for  The Day(0-4) Number of Accidents Beverages Comments                                                           This week my symptoms were:  O much better  O better O the same O worse   

## 2021-10-07 NOTE — Progress Notes (Signed)
PTNS  Session # 11 of 45  Health & Social Factors: no change Caffeine: 3 Alcohol: 0 Daytime voids #per day: 8 Night-time voids #per night: 5 Urgency: none Incontinence Episodes #per day: 0 Ankle used: right Treatment Setting: 12 Feeling/ Response: sensory Comments: patient tolerated well  Performed By: Elberta Leatherwood, CMA  Follow Up: 1 week #12

## 2021-10-14 ENCOUNTER — Ambulatory Visit (INDEPENDENT_AMBULATORY_CARE_PROVIDER_SITE_OTHER): Payer: Medicare Other | Admitting: *Deleted

## 2021-10-14 ENCOUNTER — Other Ambulatory Visit: Payer: Self-pay

## 2021-10-14 DIAGNOSIS — N3281 Overactive bladder: Secondary | ICD-10-CM | POA: Diagnosis not present

## 2021-10-14 NOTE — Progress Notes (Signed)
Patient ID: Andrew Callahan, male   DOB: [birth year], 87 y.o.   MRN: [id number] PTNS  Session # 12 of 45  Health & Social Factors: no change Caffeine: 3 Alcohol: 0 Daytime voids #per day: 8 Night-time voids #per night: 5 Urgency: mild Incontinence Episodes #per day: 0 Ankle used: right Treatment Setting: 9 Feeling/ Response: both Comments: pt tolerated well  Performed By: Edwin Dada, CMA  Follow Up: 27mth to discuss PTNS

## 2021-10-19 ENCOUNTER — Other Ambulatory Visit: Payer: Self-pay | Admitting: Oncology

## 2021-10-19 DIAGNOSIS — C61 Malignant neoplasm of prostate: Secondary | ICD-10-CM

## 2021-10-31 ENCOUNTER — Ambulatory Visit (INDEPENDENT_AMBULATORY_CARE_PROVIDER_SITE_OTHER): Payer: Medicare Other | Admitting: Dermatology

## 2021-10-31 ENCOUNTER — Other Ambulatory Visit: Payer: Self-pay

## 2021-10-31 DIAGNOSIS — L309 Dermatitis, unspecified: Secondary | ICD-10-CM

## 2021-10-31 DIAGNOSIS — L720 Epidermal cyst: Secondary | ICD-10-CM | POA: Diagnosis not present

## 2021-10-31 DIAGNOSIS — B009 Herpesviral infection, unspecified: Secondary | ICD-10-CM | POA: Diagnosis not present

## 2021-10-31 DIAGNOSIS — D229 Melanocytic nevi, unspecified: Secondary | ICD-10-CM

## 2021-10-31 DIAGNOSIS — L821 Other seborrheic keratosis: Secondary | ICD-10-CM

## 2021-10-31 DIAGNOSIS — L578 Other skin changes due to chronic exposure to nonionizing radiation: Secondary | ICD-10-CM

## 2021-10-31 DIAGNOSIS — Z85828 Personal history of other malignant neoplasm of skin: Secondary | ICD-10-CM | POA: Diagnosis not present

## 2021-10-31 DIAGNOSIS — L820 Inflamed seborrheic keratosis: Secondary | ICD-10-CM | POA: Diagnosis not present

## 2021-10-31 DIAGNOSIS — L814 Other melanin hyperpigmentation: Secondary | ICD-10-CM

## 2021-10-31 DIAGNOSIS — Z1283 Encounter for screening for malignant neoplasm of skin: Secondary | ICD-10-CM | POA: Diagnosis not present

## 2021-10-31 MED ORDER — TRIAMCINOLONE ACETONIDE 0.1 % EX CREA
1.0000 | TOPICAL_CREAM | CUTANEOUS | 1 refills | Status: DC
Start: 2021-10-31 — End: 2022-09-14

## 2021-10-31 MED ORDER — ACYCLOVIR 5 % EX OINT: 1.0000 "application " | TOPICAL_OINTMENT | CUTANEOUS | 11 refills | Status: DC

## 2021-10-31 NOTE — Progress Notes (Signed)
Follow-Up Visit   Subjective  Andrew Callahan is a 87 y.o. male who presents for the following: Upper body skin exam (Hx of SCC R hand) and hx of herpes (Pt has had for yrs and would like medication). Has several itchy growths he will point out.  Patient accompanied by wife who contributes to history.  The following portions of the chart were reviewed this encounter and updated as appropriate:       Review of Systems:  No other skin or systemic complaints except as noted in HPI or Assessment and Plan.  Objective  Well appearing patient in no apparent distress; mood and affect are within normal limits.  All skin waist up examined.  groin Not examined  R upper chest x 1, R forearm x 1, L hand thenar webspace x 1, R lower neck x 1, Total = 4 (4) Erythematous keratotic or waxy stuck-on papule    vertex scalp Speckled light tan macule 1.0cm  back x 2 Firm SQ nodule with punctum  R first webspace Pink scaly patch   Assessment & Plan   Lentigines - Scattered tan macules - Due to sun exposure - Benign-appearing, observe - Recommend daily broad spectrum sunscreen SPF 30+ to sun-exposed areas, reapply every 2 hours as needed. - Call for any changes  Seborrheic Keratoses - Stuck-on, waxy, tan-brown papules and/or plaques  - Benign-appearing - Discussed benign etiology and prognosis. - Observe - Call for any changes - scalp, trunk, L jaw  Melanocytic Nevi - Tan-brown and/or pink-flesh-colored symmetric macules and papules - Benign appearing on exam today - Observation - Call clinic for new or changing moles - Recommend daily use of broad spectrum spf 30+ sunscreen to sun-exposed areas.   Actinic Damage - Chronic condition, secondary to cumulative UV/sun exposure - diffuse scaly erythematous macules with underlying dyspigmentation - Recommend daily broad spectrum sunscreen SPF 30+ to sun-exposed areas, reapply every 2 hours as needed.  - Staying in the shade or  wearing long sleeves, sun glasses (UVA+UVB protection) and wide brim hats (4-inch brim around the entire circumference of the hat) are also recommended for sun protection.  - Call for new or changing lesions.  Skin cancer screening performed today.  History of Squamous Cell Carcinoma of the Skin - No evidence of recurrence today - Recommend regular full body skin exams - Recommend daily broad spectrum sunscreen SPF 30+ to sun-exposed areas, reapply every 2 hours as needed.  - Call if any new or changing lesions are noted between office visits - R hand   HSV (herpes simplex virus) infection groin  Herpes Simplex Virus = Cold Sores = Fever Blisters is a chronic recurring blistering; scabbing sore-producing viral infection that is recurrent usually in the same area triggered by stress, sun/UV exposure and trauma.  It is infectious and can be spread from person to person by direct contact.  It is not curable, but is treatable with topical and oral medication.  Start Zovirax oint q 3 hours until resolved prn flares Discussed adding oral suppressive therapy, but pt defers  acyclovir ointment (ZOVIRAX) 5 % - groin Apply 1 application topically every 3 (three) hours. Apply to aa groin prn flares q 3 hours until resolved  Inflamed seborrheic keratosis R upper chest x 1, R forearm x 1, L hand thenar webspace x 1, R lower neck x 1, Total = 4  Recheck L thenar hand  (ISK vrs Wart vrs HyAK) on f/up  Destruction of lesion - R upper chest  x 1, R forearm x 1, L hand thenar webspace x 1, R lower neck x 1, Total = 4  Destruction method: cryotherapy   Informed consent: discussed and consent obtained   Lesion destroyed using liquid nitrogen: Yes   Region frozen until ice ball extended beyond lesion: Yes   Outcome: patient tolerated procedure well with no complications   Post-procedure details: wound care instructions given   Additional details:  Prior to procedure, discussed risks of blister  formation, small wound, skin dyspigmentation, or rare scar following cryotherapy. Recommend Vaseline ointment to treated areas while healing.   Lentigines vertex scalp  Vs SK  Benign appearing, observe  Epidermal cyst back x 2  Benign-appearing. Exam most consistent with an epidermal inclusion cyst. Discussed that a cyst is a benign growth that can grow over time and sometimes get irritated or inflamed. Recommend observation if it is not bothersome. Discussed option of surgical excision to remove it if it is growing, symptomatic, or other changes noted. Please call for new or changing lesions so they can be evaluated.      Hand dermatitis R first webspace  Hand Dermatitis is a chronic type of eczema that can come and go on the hands and fingers.  While there is no cure, the rash and symptoms can be managed with topical prescription medications, and for more severe cases, with systemic medications.  Recommend mild soap and routine use of moisturizing cream after handwashing.  Minimize soap/water exposure when possible.     Start TMC 0.1% cr qd/bid until clear, then prn flares, avoid face, groin, axilla  Topical steroids (such as triamcinolone, fluocinolone, fluocinonide, mometasone, clobetasol, halobetasol, betamethasone, hydrocortisone) can cause thinning and lightening of the skin if they are used for too long in the same area. Your physician has selected the right strength medicine for your problem and area affected on the body. Please use your medication only as directed by your physician to prevent side effects.    triamcinolone cream (KENALOG) 0.1 % - R first webspace Apply 1 application topically as directed. Qd to bid up to 5 days per week to right hand until clear, then prn flares, avoid face, groin, axilla  Return in about 2 months (around 12/31/2021) for recheck ISK L hand thenar webspace and Hand Dermatitis, 1 yr UBSE, Hx of SCC, .  I, Sonya Hupman, RMA, am acting as scribe for  Brendolyn Patty, MD .  Documentation: I have reviewed the above documentation for accuracy and completeness, and I agree with the above.  Brendolyn Patty MD

## 2021-10-31 NOTE — Patient Instructions (Addendum)
If You Need Anything After Your Visit  If you have any questions or concerns for your doctor, please call our main line at [phone] and press option 4 to reach your doctor's medical assistant. If no one answers, please leave a voicemail as directed and we will return your call as soon as possible. Messages left after 4 pm will be answered the following business day.   You may also send us a message via MyChart. We typically respond to MyChart messages within 1-2 business days.  For prescription refills, please ask your pharmacy to contact our office. Our fax number is [phone].  If you have an urgent issue when the clinic is closed that cannot wait until the next business day, you can page your doctor at the number below.    Please note that while we do our best to be available for urgent issues outside of office hours, we are not available 24/7.   If you have an urgent issue and are unable to reach us, you may choose to seek medical care at your doctor's office, retail clinic, urgent care center, or emergency room.  If you have a medical emergency, please immediately call 911 or go to the emergency department.  Pager Numbers  - Dr. Kowalski: [phone]  - Dr. Moye: [phone]  - Dr. Stewart: [phone]  In the event of inclement weather, please call our main line at [phone] for an update on the status of any delays or closures.  Dermatology Medication Tips: Please keep the boxes that topical medications come in in order to help keep track of the instructions about where and how to use these. Pharmacies typically print the medication instructions only on the boxes and not directly on the medication tubes.   If your medication is too expensive, please contact our office at [phone] option 4 or send us a message through MyChart.   We are unable to tell what your co-pay for medications will be in advance as this is different depending on your insurance coverage.  However, we may be able to find a substitute medication at lower cost or fill out paperwork to get insurance to cover a needed medication.   If a prior authorization is required to get your medication covered by your insurance company, please allow us 1-2 business days to complete this process.  Drug prices often vary depending on where the prescription is filled and some pharmacies may offer cheaper prices.  The website www.goodrx.com contains coupons for medications through different pharmacies. The prices here do not account for what the cost may be with help from insurance (it may be cheaper with your insurance), but the website can give you the price if you did not use any insurance.  - You can print the associated coupon and take it with your prescription to the pharmacy.  - You may also stop by our office during regular business hours and pick up a GoodRx coupon card.  - If you need your prescription sent electronically to a different pharmacy, notify our office through [place] MyChart or by phone at [phone] option 4.     Si Usted Necesita Algo Despus de Su Visita  Tambin puede enviarnos un mensaje a travs de MyChart. Por lo general respondemos a los mensajes de MyChart en el transcurso de 1 a 2 das hbiles.  Para renovar recetas, por favor pida a su farmacia que se ponga en contacto con nuestra oficina. Nuestro nmero de fax es el [phone].  Si tiene   un asunto urgente cuando la clnica est cerrada y que no puede esperar hasta el siguiente da hbil, puede llamar/localizar a su doctor(a) al nmero que aparece a continuacin.   Por favor, tenga en cuenta que aunque hacemos todo lo posible para estar disponibles para asuntos urgentes fuera del horario de [place], no estamos disponibles las 24 horas del da, los 7 das de la [place].   Si tiene un problema urgente y no puede comunicarse con nosotros, puede optar por buscar atencin mdica  en el consultorio de su  doctor(a), en una clnica privada, en un centro de atencin urgente o en una sala de emergencias.  Si tiene Engineering geologist, por favor llame inmediatamente al 911 o vaya a la sala de emergencias.  Nmeros de bper  - Dr. Nehemiah Massed: [phone]  - Dra. Moye: [phone]  - Dra. Nicole Kindred: [phone]  En caso de inclemencias del [place], por favor llame a Johnsie Kindred principal al [phone] para una actualizacin sobre el [place] de cualquier retraso o cierre.  Consejos para la medicacin en dermatologa: Por favor, guarde las cajas en las que vienen los medicamentos de uso tpico para ayudarle a seguir las instrucciones sobre dnde y cmo usarlos. Las farmacias generalmente imprimen las instrucciones del medicamento slo en las cajas y no directamente en los tubos del [place].   Si su medicamento es muy caro, por favor, pngase en contacto con Zigmund Daniel llamando al [phone] y presione la opcin 4 o envenos un mensaje a travs de Pharmacist, community.   No podemos decirle cul ser su copago por los medicamentos por adelantado ya que esto es diferente dependiendo de la cobertura de su seguro. Sin embargo, es posible que podamos encontrar un medicamento sustituto a Electrical engineer un formulario para que el seguro cubra el medicamento que se considera necesario.   Si se requiere una autorizacin previa para que su compaa de seguros [place] su medicamento, por favor permtanos de 1 a 2 das hbiles para completar este proceso.  Los precios de los medicamentos varan con frecuencia dependiendo del Environmental consultant de dnde se surte la receta y alguna farmacias pueden ofrecer precios ms baratos.  El sitio web www.goodrx.com tiene cupones para medicamentos de Airline pilot. Los precios aqu no tienen en cuenta lo que podra costar con la ayuda del seguro (puede ser ms barato con su seguro), pero el sitio web puede darle el precio si no utiliz Research scientist (physical sciences).  - Puede imprimir el cupn  correspondiente y llevarlo con su receta a la farmacia.  - Tambin puede pasar por nuestra oficina durante el horario de atencin regular y Charity fundraiser una tarjeta de cupones de GoodRx.  - Si necesita que su receta se enve electrnicamente a una farmacia diferente, informe a nuestra oficina a travs de MyChart de [place] o por telfono llamando al [phone] y presione la opcin 4.  Cryotherapy Aftercare  Wash gently with soap and water everyday.   Apply Vaseline and Band-Aid daily until healed.    Dry Skin Care  What causes dry skin?  Dry skin is common and results from inadequate moisture in the outer skin layers. Dry skin usually results from the excessive loss of moisture from the skin surface. This occurs due to two major factors: Normally the skin's oil glands deposit a layer of oil on the skin's surface. This layer of oil prevents the loss of moisture from the skin. Exposure to soaps, cleaners, solvents, and disinfectants removes this oily film, allowing water to escape. Water  loss from the skin increases when the humidity is low. During winter months we spend a lot of time indoors where the air is heated. Heated air has very low humidity. This also contributes to dry skin.  A tendency for dry skin may accompany such disorders as eczema. Also, as people age, the number of functioning oil glands decreases, and the tendency toward dry skin can be a sensation of skin tightness when emerging from the shower.  How do I manage dry skin?  Humidify your environment. This can be accomplished by using a humidifier in your bedroom at night during winter months. Bathing can actually put moisture back into your skin if done right. Take the following steps while bathing to sooth dry skin: Avoid hot water, which only dries the skin and makes itching worse. Use warm water. Avoid washcloths or extensive rubbing or scrubbing. Use mild soaps like unscented Dove, Oil of Olay, Cetaphil, Basis, or  CeraVe. If you take baths rather than showers, rinse off soap residue with clean water before getting out of tub. Once out of the shower/tub, pat dry gently with a soft towel. Leave your skin damp. While still damp, apply any medicated ointment/cream you were prescribed to the affected areas. After you apply your medicated ointment/cream, then apply your moisturizer to your whole body.This is the most important step in dry skin care. If this is omitted, your skin will continue to be dry. The choice of moisturizer is also very important. In general, lotion will not provider enough moisture to severely dry skin because it is water based. You should use an ointment or cream. Moisturizers should also be unscented. Good choices include Vaseline (plain petrolatum), Aquaphor, Cetaphil, CeraVe, Vanicream, DML Forte, Aveeno moisture, or Eucerin Cream. Bath oils can be helpful, but do not replace the application of moisturizer after the bath. In addition, they make the tub slippery causing an increased risk for falls. Therefore, we do not recommend their use.

## 2021-11-10 ENCOUNTER — Ambulatory Visit: Payer: Medicare Other | Admitting: Urology

## 2021-11-10 ENCOUNTER — Ambulatory Visit (INDEPENDENT_AMBULATORY_CARE_PROVIDER_SITE_OTHER): Payer: Medicare Other | Admitting: Urology

## 2021-11-10 ENCOUNTER — Encounter: Payer: Self-pay | Admitting: Urology

## 2021-11-10 ENCOUNTER — Other Ambulatory Visit: Payer: Self-pay

## 2021-11-10 VITALS — BP 151/87 | HR 54 | Ht 70.0 in | Wt 225.0 lb

## 2021-11-10 DIAGNOSIS — C61 Malignant neoplasm of prostate: Secondary | ICD-10-CM

## 2021-11-10 DIAGNOSIS — R351 Nocturia: Secondary | ICD-10-CM

## 2021-11-10 MED ORDER — TROSPIUM CHLORIDE ER 60 MG PO CP24: 60.0000 mg | ORAL_CAPSULE | Freq: Every day | ORAL | 11 refills | Status: DC

## 2021-11-10 NOTE — Progress Notes (Signed)
   11/10/2021 5:28 PM   Andrew Callahan [birth year] [id number]  Reason for visit: Follow up nocturia, urinary symptoms, metastatic prostate cancer  HPI: 87 year old male with history of stroke and metastatic prostate cancer managed by oncology who originally presented with urinary retention and elevated PVRs.  With his metastatic prostate cancer he opted for a HOLEP to get out of retention, and systemic treatment for his widely metastatic disease with significant bony mets.  He has been urinating with a strong stream and emptying his bladder well with low PVRs since that time, but continues to have some frequency during the day every 2-3 hours, and nocturia 3-4 times overnight.  He was having nocturia 3-4 times overnight prior to undergoing HOLEP.  He is not having any incontinence.  He has had a cystoscopy which was normal and showed a wide open prostatic fossa and intact sphincter with no bladder lesions.  We have tried numerous strategies for his overactive symptoms and nocturia including bladder diaries, anticholinergic OAB medications, both beta 3 agonist Myrbetriq and vibegron, desmopressin overnight, and most recently PTNS.  He denies improvement with any of these strategies, though in my old notes they did at one point feel that the trospium was helping.  He also tried a condom catheter overnight but this kept falling off.  They asked about Botox today in the bladder, but I am not sure this would be a great option, as he would not tolerate the catheter well or CIC if he ended up being unable to urinate.  He continues to drink fluids in the evening, including tea in the afternoon, and we reviewed behavioral strategies again extensively.  I recommended a trial of melatonin to see if this helps him sleep better, and if this does not work he can go back to trying the 60 mg XL trospium since that reportedly did help in the past.  I had another long conversation with the patient and his partner  today about realistic expectations with his history of stroke, metastatic prostate cancer, long history of urinary symptoms, and complexities of managing nocturia and the multifactorial causes.  I think we need to have reasonable expectations moving forward.  -Behavioral strategies including avoiding bladder irritants and minimizing fluids 4 to 5 hours before bedtime reviewed again -Trial of melatonin for sleep to see if this helps with his nocturia -If no improvement with above, okay to try the trospium 60 mg XL -Virtual visit 4 months symptom check   I spent 45 total minutes on the day of the encounter including pre-visit review of the medical record, face-to-face time with the patient, and post visit ordering of labs/imaging/tests.  Billey Co, [facility] [address] [place], [place] [address] [phone]

## 2021-11-10 NOTE — Patient Instructions (Signed)
Remember to avoid fluids that irritate your bladder like caffeine, tea, and diet drinks.  Cut back on fluids 4 to 5 hours before bedtime, and urinate right before bed.  I recommend starting with melatonin over the next few weeks to see if this helps with the overnight urination, and if you have no improvement okay to try the trospium XL daily, this can take a few weeks to work.

## 2021-11-14 ENCOUNTER — Encounter: Payer: Medicare Other | Attending: Pulmonary Disease | Admitting: *Deleted

## 2021-11-14 ENCOUNTER — Other Ambulatory Visit: Payer: Self-pay

## 2021-11-14 DIAGNOSIS — G4733 Obstructive sleep apnea (adult) (pediatric): Secondary | ICD-10-CM

## 2021-11-14 NOTE — Progress Notes (Signed)
Initial telephone orientation completed. Diagnosis can be found in [facility] 11/17. EP orientation scheduled for Wednesday 12/28 at 1:30 pm.

## 2021-11-18 ENCOUNTER — Observation Stay
Admission: EM | Admit: 2021-11-18 | Discharge: 2021-11-19 | Disposition: A | Discharge status: Home / Self Care | Payer: Medicare Other | Attending: Internal Medicine | Admitting: Internal Medicine

## 2021-11-18 ENCOUNTER — Emergency Department: Payer: Medicare Other

## 2021-11-18 ENCOUNTER — Other Ambulatory Visit: Payer: Self-pay

## 2021-11-18 DIAGNOSIS — R7989 Other specified abnormal findings of blood chemistry: Secondary | ICD-10-CM

## 2021-11-18 DIAGNOSIS — R778 Other specified abnormalities of plasma proteins: Secondary | ICD-10-CM

## 2021-11-18 DIAGNOSIS — Z7901 Long term (current) use of anticoagulants: Secondary | ICD-10-CM

## 2021-11-18 DIAGNOSIS — Z8546 Personal history of malignant neoplasm of prostate: Secondary | ICD-10-CM | POA: Insufficient documentation

## 2021-11-18 DIAGNOSIS — E119 Type 2 diabetes mellitus without complications: Secondary | ICD-10-CM | POA: Diagnosis not present

## 2021-11-18 DIAGNOSIS — I5032 Chronic diastolic (congestive) heart failure: Secondary | ICD-10-CM | POA: Diagnosis present

## 2021-11-18 DIAGNOSIS — I214 Non-ST elevation (NSTEMI) myocardial infarction: Secondary | ICD-10-CM | POA: Diagnosis not present

## 2021-11-18 DIAGNOSIS — Z79899 Other long term (current) drug therapy: Secondary | ICD-10-CM | POA: Insufficient documentation

## 2021-11-18 DIAGNOSIS — R079 Chest pain, unspecified: Secondary | ICD-10-CM

## 2021-11-18 DIAGNOSIS — I4891 Unspecified atrial fibrillation: Secondary | ICD-10-CM

## 2021-11-18 DIAGNOSIS — E039 Hypothyroidism, unspecified: Secondary | ICD-10-CM | POA: Diagnosis present

## 2021-11-18 DIAGNOSIS — I480 Paroxysmal atrial fibrillation: Secondary | ICD-10-CM | POA: Diagnosis not present

## 2021-11-18 DIAGNOSIS — Z85828 Personal history of other malignant neoplasm of skin: Secondary | ICD-10-CM | POA: Insufficient documentation

## 2021-11-18 DIAGNOSIS — Z96652 Presence of left artificial knee joint: Secondary | ICD-10-CM | POA: Insufficient documentation

## 2021-11-18 DIAGNOSIS — I110 Hypertensive heart disease with heart failure: Secondary | ICD-10-CM | POA: Diagnosis not present

## 2021-11-18 DIAGNOSIS — I10 Essential (primary) hypertension: Secondary | ICD-10-CM | POA: Diagnosis present

## 2021-11-18 DIAGNOSIS — Z20822 Contact with and (suspected) exposure to covid-19: Secondary | ICD-10-CM | POA: Diagnosis not present

## 2021-11-18 DIAGNOSIS — E2749 Other adrenocortical insufficiency: Secondary | ICD-10-CM | POA: Diagnosis present

## 2021-11-18 DIAGNOSIS — Z87891 Personal history of nicotine dependence: Secondary | ICD-10-CM | POA: Insufficient documentation

## 2021-11-18 LAB — RESP PANEL BY RT-PCR (FLU A&B, COVID) ARPGX2
Influenza A by PCR: NEGATIVE
Influenza B by PCR: NEGATIVE
SARS Coronavirus 2 by RT PCR: NEGATIVE

## 2021-11-18 LAB — CBC WITH DIFFERENTIAL/PLATELET
Abs Immature Granulocytes: 0.04 10*3/uL (ref 0.00–0.07)
Basophils Absolute: 0.1 10*3/uL (ref 0.0–0.1)
Basophils Relative: 1 %
Eosinophils Absolute: 0.1 10*3/uL (ref 0.0–0.5)
Eosinophils Relative: 1 %
HCT: 41.9 % (ref 39.0–52.0)
Hemoglobin: 13.9 g/dL (ref 13.0–17.0)
Immature Granulocytes: 0 %
Lymphocytes Relative: 31 %
Lymphs Abs: 3.4 10*3/uL (ref 0.7–4.0)
MCH: 29.3 pg (ref 26.0–34.0)
MCHC: 33.2 g/dL (ref 30.0–36.0)
MCV: 88.2 fL (ref 80.0–100.0)
Monocytes Absolute: 1 10*3/uL (ref 0.1–1.0)
Monocytes Relative: 9 %
Neutro Abs: 6.5 10*3/uL (ref 1.7–7.7)
Neutrophils Relative %: 58 %
Platelets: 203 10*3/uL (ref 150–400)
RBC: 4.75 MIL/uL (ref 4.22–5.81)
RDW: 15.5 % (ref 11.5–15.5)
WBC: 11.1 10*3/uL — ABNORMAL HIGH (ref 4.0–10.5)
nRBC: 0 % (ref 0.0–0.2)

## 2021-11-18 LAB — BASIC METABOLIC PANEL
Anion gap: 10 (ref 5–15)
BUN: 22 mg/dL (ref 8–23)
CO2: 24 mmol/L (ref 22–32)
Calcium: 9.2 mg/dL (ref 8.9–10.3)
Chloride: 105 mmol/L (ref 98–111)
Creatinine, Ser: 1.12 mg/dL (ref 0.61–1.24)
GFR, Estimated: >60 mL/min (ref >60)
Glucose, Bld: 189 mg/dL — ABNORMAL HIGH (ref 70–99)
Potassium: 3.6 mmol/L (ref 3.5–5.1)
Sodium: 139 mmol/L (ref 135–145)

## 2021-11-18 LAB — TROPONIN I (HIGH SENSITIVITY)
Troponin I (High Sensitivity): 11 ng/L (ref <18)
Troponin I (High Sensitivity): 75 ng/L — ABNORMAL HIGH (ref <18)
Troponin I (High Sensitivity): 188 ng/L (ref <18)
Troponin I (High Sensitivity): 160 ng/L (ref <18)

## 2021-11-18 LAB — CBG MONITORING, ED
Glucose-Capillary: 116 mg/dL — ABNORMAL HIGH (ref 70–99)
Glucose-Capillary: 120 mg/dL — ABNORMAL HIGH (ref 70–99)
Glucose-Capillary: 88 mg/dL (ref 70–99)

## 2021-11-18 LAB — MAGNESIUM: Magnesium: 2.4 mg/dL (ref 1.7–2.4)

## 2021-11-18 LAB — APTT
aPTT: 38 seconds — ABNORMAL HIGH (ref 24–36)
aPTT: 73 seconds — ABNORMAL HIGH (ref 24–36)
aPTT: 78 seconds — ABNORMAL HIGH (ref 24–36)

## 2021-11-18 LAB — GLUCOSE, CAPILLARY: Glucose-Capillary: 119 mg/dL — ABNORMAL HIGH (ref 70–99)

## 2021-11-18 LAB — PROTIME-INR
INR: 1 (ref 0.8–1.2)
Prothrombin Time: 13.2 seconds (ref 11.4–15.2)

## 2021-11-18 LAB — TSH: TSH: 4.046 u[IU]/mL (ref 0.350–4.500)

## 2021-11-18 LAB — T4, FREE: Free T4: 0.7 ng/dL (ref 0.61–1.12)

## 2021-11-18 LAB — HEPARIN LEVEL (UNFRACTIONATED): Heparin Unfractionated: >1.1 IU/mL — ABNORMAL HIGH (ref 0.30–0.70)

## 2021-11-18 MED ORDER — ONDANSETRON HCL 4 MG/2ML IJ SOLN
4.0000 mg | Freq: Once | INTRAMUSCULAR | Status: AC
  Administered 2021-11-18: 4 mg via INTRAVENOUS
  Filled 2021-11-18: qty 2

## 2021-11-18 MED ORDER — SODIUM CHLORIDE 0.9 % IV BOLUS (SEPSIS)
500.0000 mL | Freq: Once | INTRAVENOUS | Status: AC
  Administered 2021-11-18: 500 mL via INTRAVENOUS

## 2021-11-18 MED ORDER — ASPIRIN 81 MG PO CHEW
324.0000 mg | CHEWABLE_TABLET | Freq: Once | ORAL | Status: AC
  Administered 2021-11-18: 324 mg via ORAL
  Filled 2021-11-18: qty 4

## 2021-11-18 MED ORDER — FENTANYL CITRATE PF 50 MCG/ML IJ SOSY
25.0000 ug | PREFILLED_SYRINGE | Freq: Once | INTRAMUSCULAR | Status: AC
  Administered 2021-11-18: 25 ug via INTRAVENOUS
  Filled 2021-11-18: qty 1

## 2021-11-18 MED ORDER — INSULIN ASPART 100 UNIT/ML IJ SOLN: 0.0000 [IU] | Freq: Every day | INTRAMUSCULAR | Status: DC

## 2021-11-18 MED ORDER — ASPIRIN EC 81 MG PO TBEC
81.0000 mg | DELAYED_RELEASE_TABLET | Freq: Every day | ORAL | Status: DC
  Administered 2021-11-19: 81 mg via ORAL
  Filled 2021-11-18: qty 1

## 2021-11-18 MED ORDER — ONDANSETRON HCL 4 MG/2ML IJ SOLN: 4.0000 mg | Freq: Four times a day (QID) | INTRAMUSCULAR | Status: DC | PRN

## 2021-11-18 MED ORDER — ACETAMINOPHEN 325 MG PO TABS: 650.0000 mg | ORAL_TABLET | ORAL | Status: DC | PRN

## 2021-11-18 MED ORDER — HYDROCORTISONE 10 MG PO TABS
10.0000 mg | ORAL_TABLET | Freq: Two times a day (BID) | ORAL | Status: DC
  Administered 2021-11-18 – 2021-11-19 (×2): 10 mg via ORAL
  Filled 2021-11-18 (×6): qty 1

## 2021-11-18 MED ORDER — HEPARIN (PORCINE) 25000 UT/250ML-% IV SOLN
1200.0000 [IU]/h | INTRAVENOUS | Status: DC
Start: 2021-11-18 — End: 2021-11-19
  Administered 2021-11-18 (×2): 1300 [IU]/h via INTRAVENOUS
  Filled 2021-11-18 (×2): qty 250

## 2021-11-18 MED ORDER — NITROGLYCERIN 0.4 MG SL SUBL: 0.4000 mg | SUBLINGUAL_TABLET | SUBLINGUAL | Status: DC | PRN

## 2021-11-18 MED ORDER — INSULIN ASPART 100 UNIT/ML IJ SOLN
0.0000 [IU] | Freq: Three times a day (TID) | INTRAMUSCULAR | Status: DC
  Administered 2021-11-19: 2 [IU] via SUBCUTANEOUS
  Filled 2021-11-18: qty 1

## 2021-11-18 NOTE — Consult Note (Signed)
CARDIOLOGY CONSULT NOTE               Patient ID: Andrew Callahan MRN: [id number] DOB/AGE: Aug 16, 1934 87 y.o.  Admit date: 11/18/2021 Referring Physician Kurtis Bushman Primary Physician [facility] Primary Cardiologist Fath Reason for Consultation atrial fibrillation with RVR, CP  HPI: 87 year old male resident of [place] who was referred for chest pain, elevated troponin, and atrial fibrillation with RVR. The patient has a history of paroxysmal atrial fibrillation on Eliquis, history of recent stroke while off of anticoagulation, OSA, CPAP-compliant, chronic diastolic CHF, and metastatic prostate cancer. The patient was in his usual state of health until yesterday evening when he woke up with heart pounding, which then progressed to chest pressure with radiation to bilateral arms to the elbows. He also had slight increased shortness of breath and nausea. He was evaluated by the [facility] EMTs and advised to go to the ER due to his rapid ventricular rate. Per EMS report, his blood pressure was 150/100, heart rate 160s, then hypotensive with blood pressure 78/42. Upon arrival to the ED, the patient spontaneously converted to sinus tachycardia, and blood pressure improved. Labs notable for high sensitivity troponin 11 and 75, no evidence of anemia, stable renal function, K 3.6, Na 139, Mg 2.4, normal thyroid panel. Chest xray negative for acute cardiopulmonary process. ECG revealed showed sinus rhythm at a rate of 90 bpm with PACs and LVH. The patient was started on heparin due to persistent arm discomfort. He was given fentanyl. At this time, the patient reports feeling well with the exception of abdominal discomfort due to constipation. He denies palpitations, chest pain, arm pain, or shortness of breath at this time. The patient drinks alcohol infrequently. He denies any recent missed medication doses. The patient did not tolerate metoprolol in the past. 2D echocardiogram on 02/18/2021 revealed LVEF greater  than 55% with mild mitral regurgitation and moderate LVH.  Review of systems complete and found to be negative unless listed above     Past Medical History:  Diagnosis Date   Acquired hypothyroidism    Aortic atherosclerosis ([place])    Atrial fibrillation ([place])    Chronic constipation    CVA (cerebral vascular accident) ([place])    DDD (degenerative disc disease), cervical    Diabetes mellitus without complication ([place])    Diverticulosis    Elevated prostate specific antigen (PSA)    Family history of leukemia    Family history of lung cancer    History of kidney stones    History of pituitary adenoma    Hyperlipidemia    Hypertension    Hypogonadism in male    Hypothyroid    Hypothyroidism    IDA (iron deficiency anemia)    Lumbar disc disease    Nephrolithiasis    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 Mohs.   Stroke ([place])    throat / slight difficulty swallowing    Past Surgical History:  Procedure Laterality Date   BACK SURGERY  1997   L3, 4, 5 laminectomy    BACK SURGERY  1998   hard wire removed, fusion of L3-5   BACK SURGERY  2009   laminotomy/foraminotomy w/ decompression of nerve root, facet thermal ablation L2-3.    COLONOSCOPY     COLONOSCOPY WITH PROPOFOL  N/A 02/04/2021   Procedure: COLONOSCOPY WITH PROPOFOL;  Surgeon: Lesly Rubenstein, MD;  Location: [facility] ENDOSCOPY;  Service: Endoscopy;  Laterality: N/A;  STAT CBC BEFORE PROCEDURE   ESOPHAGOGASTRODUODENOSCOPY (EGD) WITH PROPOFOL N/A 02/04/2021   Procedure: ESOPHAGOGASTRODUODENOSCOPY (EGD) WITH PROPOFOL;  Surgeon: Lesly Rubenstein, MD;  Location: [facility] ENDOSCOPY;  Service: Endoscopy;  Laterality: N/A;   HERNIA REPAIR     HOLEP-LASER ENUCLEATION OF THE PROSTATE WITH MORCELLATION N/A 06/18/2020   Procedure: HOLEP-LASER  ENUCLEATION OF THE PROSTATE WITH MORCELLATION;  Surgeon: Billey Co, MD;  Location: [facility] ORS;  Service: Urology;  Laterality: N/A;   JOINT REPLACEMENT     left knee partial   KIDNEY STONE SURGERY  2010   KNEE SURGERY Left 2007   Arthroscopic partial knee replacement, replaced medial side    MOHS SURGERY Right 2014   squamous cell carcinoma   NECK SURGERY  2008   fusion with hardwire in place C3 through C7    pituitary adenoma  1994   benign   PROSTATE BIOPSY N/A 01/01/2020   Procedure: PROSTATE BIOPSY URO NAV FUSION;  Surgeon: Royston Cowper, MD;  Location: [facility] ORS;  Service: Urology;  Laterality: N/A;   [place] SURGERY  2016   fusion L4    TONSILLECTOMY      (Not in a hospital admission)  Social History   Socioeconomic History   Marital status: Married    Spouse name: Not on file   Number of children: Not on file   Years of education: Not on file   Highest education level: Not on file  Occupational History   Not on file  Tobacco Use   Smoking status: Former   Smokeless tobacco: Never   Tobacco comments:    quit 50 years ago  Vaping Use   Vaping Use: Never used  Substance and Sexual Activity   Alcohol use: Yes    Comment: rarely   Drug use: Never   Sexual activity: Yes    Birth control/protection: None  Other Topics Concern   Not on file  Social History Narrative   Not on file   Social Determinants of Health   Financial Resource Strain: Not on file  Food Insecurity: Not on file  Transportation Needs: Not on file  Physical Activity: Not on file  Stress: Not on file  Social Connections: Not on file  Intimate Partner Violence: Not on file    Family History  Problem Relation Age of Onset   Stroke Mother    Heart attack Father    Leukemia Sister 6   Lung cancer Brother 11      Review of systems complete and found to be negative unless listed above      PHYSICAL EXAM  General: Well developed, well nourished, in no acute  distress, sitting up in bed, pleasant HEENT:  Normocephalic and atramatic Neck:  No JVD.  Lungs: Clear bilaterally to auscultation, normal effort of breathing on RA Heart: Heart regular rhythm, bradycardic. Normal S1 and S2 without gallops or murmurs.  Msk:  Normal strength and tone for age. Extremities: No clubbing, cyanosis or edema.   Neuro: Alert and oriented X 3. Psych:  Good affect, responds appropriately  Labs:   Lab Results  Component Value Date   WBC 11.1 (H) 11/18/2021   HGB 13.9 11/18/2021   HCT 41.9 11/18/2021   MCV 88.2 11/18/2021   PLT 203 11/18/2021    Recent Labs  Lab 11/18/21 0018  NA 139  K 3.6  CL 105  CO2 24  BUN 22  CREATININE 1.12  CALCIUM 9.2  GLUCOSE 189*   No results found for: CKTOTAL, CKMB, CKMBINDEX, TROPONINI No results found for: CHOL No results found for: HDL No results found for: LDLCALC No results found for: TRIG No results found for: CHOLHDL No results found for: LDLDIRECT    Radiology: DG Chest Portable 1 View  Result Date: 11/18/2021 CLINICAL DATA:  Shortness of breath and chest pain, initial encounter EXAM: PORTABLE CHEST 1 VIEW COMPARISON:  None. FINDINGS: The heart size and mediastinal contours are within normal limits. Both lungs are clear. The visualized skeletal structures are unremarkable. Postsurgical changes in the cervical spine are noted. IMPRESSION: No active disease. Electronically Signed   By: Inez Catalina M.D.   On: 11/18/2021 00:45    EKG: Sinus rhythm, 54 bpm, PACs  ASSESSMENT AND PLAN:   Atrial fibrillation with RVR, spontaneously converted to sinus rhythm. History of paroxysmal atrial fibrillation, on Eliquis for stroke prevention. Currently on heparin drip. No currently on AV nodal blocking agents or antiarrhythmics at home. Elevated troponin (11- 75), with chest pain that has since resolved, with unremarkable ECG, likely demand supply ischemia. Currently on heparin drip. Chronic diastolic CHF, appears  euvolemic History of stroke, while off of anticoagulation Hypertension, on home valsartan  Plan: Trend troponin. If next troponin downtrending, discontinue heparin drip and resume Eliquis Defer AV nodal blocking agents due to bradycardia Defer antiarrhythmics at this time Consider prescribing metoprolol tartrate at discharge for PRN use for recurrent persistent palpitations Continue valsartan  Defer further cardiac diagnostics at this time If third troponin down-trending, recommend ambulating patient today, assessing for recurrent symptoms, and consider discharge this evening or tomorrow morning Plan to follow up with Dr. Donnelly Angelica in 1 week   Signed: Clabe Seal PA-C 11/18/2021, 8:50 AM

## 2021-11-18 NOTE — ED Triage Notes (Signed)
Arrived via EMS. Patient states he woke from sleep with chest pain 30 minutes prior to arrival. Reports pain radiates to bilateral arms and reporting head pain. EMS report initial BP 150/100 with Afib RVR 160's and patient became hypotensive with BP 78/42. Patient AOX4 on arrival to ED.

## 2021-11-18 NOTE — ED Provider Notes (Addendum)
[facility] Emergency Department Provider Note  ____________________________________________   Event Date/Time   First MD Initiated Contact with Patient 11/18/21 0016     (approximate)  I have reviewed the triage vital signs and the nursing notes.   HISTORY  Chief Complaint Chest Pain    HPI Andrew Callahan is a 87 y.o. male with history of paroxysmal atrial fibrillation on Eliquis, hypertension, diabetes, hyperlipidemia, hypothyroidism, CVA, intermittent sinus bradycardia who presents to the emergency department with complaints of chest pain that woke him from sleep about 30 minutes prior to arrival.  He describes it as a pressure in the center of his chest that radiated down both arms.  He did have shortness of breath but no nausea, vomiting, dizziness or diaphoresis.  EMS reports patient was in A. fib with RVR with a rate in the 160s.  He had a blood pressure in the 70s/40s.  EMS gave 324 mg of aspirin.  Patient states he has not on any medications to control his heart rate.  States he has not tolerated metoprolol in the past.  No infectious symptoms including fevers, cough, vomiting or diarrhea.  No bloody stools or melena.          Past Medical History:  Diagnosis Date   Acquired hypothyroidism    Aortic atherosclerosis (HCC)    Atrial fibrillation ([place])    Chronic constipation    CVA (cerebral vascular accident) ([place])    DDD (degenerative disc disease), cervical    Diabetes mellitus without complication ([place])    Diverticulosis    Elevated prostate specific antigen (PSA)    Family history of leukemia    Family history of lung cancer    History of kidney stones    History of pituitary adenoma    Hyperlipidemia    Hypertension    Hypogonadism in male    Hypothyroid    Hypothyroidism    IDA (iron deficiency anemia)    Lumbar disc disease    Nephrolithiasis    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 Mohs.   Stroke ([place])    throat / slight difficulty swallowing    Patient Active Problem List   Diagnosis Date Noted   NSTEMI (non-ST elevated myocardial infarction) ([place]) 11/18/2021   Chronic diastolic CHF (congestive heart failure), NYHA class 3 ([place]) 03/21/2021   Aortic atherosclerosis ([place]) 01/27/2021   Lower limb ulcer, calf, right, limited to breakdown of skin ([place]) 01/18/2021   Swelling of limb 01/18/2021   Secondary adrenal insufficiency ([place]) 10/08/2020   Central hypothyroidism 10/08/2020   Lower urinary tract symptoms (LUTS) 05/19/2020   Metastatic adenocarcinoma to bone ([place]) 05/03/2020   Stage IV adenocarcinoma of prostate ([place]) 05/03/2020   Genetic testing 04/07/2020   Family history of lung cancer    Family history of leukemia    Numbness and tingling of foot 03/22/2020   Goals of care, counseling/discussion 02/17/2020   Prostate cancer ([place]) 02/15/2020   Tubular adenoma 11/04/2019   Medicare annual wellness visit, initial 11/04/2019   Dysarthria 07/01/2019   History of CVA (cerebrovascular accident) 07/01/2019   Anticoagulant long-term use 06/22/2019   Left sided lacunar infarction ([place]) 06/18/2019   Hyperlipidemia 05/31/2019   Coronary artery calcification 04/12/2019   Benign essential hypertension 01/15/2019  History of pituitary adenoma 01/15/2019   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[place]) 01/15/2019   Hematuria 10/11/2018   Sinus bradycardia 10/11/2018   Thyroid disease 10/11/2018   Chronic constipation 10/04/2018   Diverticulosis large intestine w/o perforation or abscess w/o bleeding 10/04/2018   Polyp of descending colon 10/04/2018   OSA (obstructive sleep apnea) 05/15/2018   Paroxysmal atrial fibrillation with rapid ventricular response ([place]) 01/16/2018   Type 2  diabetes mellitus without complication, without long-term current use of insulin ([place]) 11/08/2017   Adrenal insufficiency ([place]) 10/05/2017   Snores 05/09/2017   Health care maintenance 11/06/2016   Mononeuropathy 10/16/2016   Obesity (BMI 35.0-39.9 without comorbidity) 04/03/2016   Tinnitus of both ears 04/03/2016   Spinal stenosis of lumbar region 01/28/2015   Benign prostatic hyperplasia without urinary obstruction 04/07/2013   Lumbar degenerative disc disease 12/23/2012   Acquired hypothyroidism 11/24/2010   Calculus, kidney 11/24/2010   Raised prostate specific antigen 11/24/2010   Hearing loss 11/24/2010   Hyperlipidemia associated with type 2 diabetes mellitus ([place]) 11/24/2010   Testicular hypofunction 11/24/2010   Osteoarthritis of multiple joints 11/24/2010   Rhinitis, allergic 11/24/2010   Skin tag 11/24/2010    Past Surgical History:  Procedure Laterality Date   BACK SURGERY  1997   L3, 4, 5 laminectomy    BACK SURGERY  1998   hard wire removed, fusion of L3-5   BACK SURGERY  2009   laminotomy/foraminotomy w/ decompression of nerve root, facet thermal ablation L2-3.    COLONOSCOPY     COLONOSCOPY WITH PROPOFOL N/A 02/04/2021   Procedure: COLONOSCOPY WITH PROPOFOL;  Surgeon: Lesly Rubenstein, MD;  Location: [facility] ENDOSCOPY;  Service: Endoscopy;  Laterality: N/A;  STAT CBC BEFORE PROCEDURE   ESOPHAGOGASTRODUODENOSCOPY (EGD) WITH PROPOFOL N/A 02/04/2021   Procedure: ESOPHAGOGASTRODUODENOSCOPY (EGD) WITH PROPOFOL;  Surgeon: Lesly Rubenstein, MD;  Location: [facility] ENDOSCOPY;  Service: Endoscopy;  Laterality: N/A;   HERNIA REPAIR     HOLEP-LASER ENUCLEATION OF THE PROSTATE WITH MORCELLATION N/A 06/18/2020   Procedure: HOLEP-LASER ENUCLEATION OF THE PROSTATE WITH MORCELLATION;  Surgeon: Billey Co, MD;  Location: [facility] ORS;  Service: Urology;  Laterality: N/A;   JOINT REPLACEMENT     left knee partial   KIDNEY STONE SURGERY  2010   KNEE SURGERY Left 2007   Arthroscopic  partial knee replacement, replaced medial side    MOHS SURGERY Right 2014   squamous cell carcinoma   NECK SURGERY  2008   fusion with hardwire in place C3 through C7    pituitary adenoma  1994   benign   PROSTATE BIOPSY N/A 01/01/2020   Procedure: PROSTATE BIOPSY URO NAV FUSION;  Surgeon: Royston Cowper, MD;  Location: [facility] ORS;  Service: Urology;  Laterality: N/A;   [place] SURGERY  2016   fusion L4    TONSILLECTOMY      Prior to Admission medications   Medication Sig Start Date End Date Taking? Authorizing Provider  abiraterone acetate (ZYTIGA) 250 MG tablet TAKE 3 TABLETS (750 MG TOTAL) BY MOUTH DAILY. TAKE ON AN EMPTY STOMACH 1 HOUR BEFORE OR 2 HOURS AFTER A MEAL 10/19/21  Yes Lloyd Huger, MD  acyclovir ointment (ZOVIRAX) 5 % Apply 1 application topically every 3 (three) hours. Apply to aa groin prn flares q 3 hours until resolved 10/31/21  Yes Brendolyn Patty, MD  B Complex-C (SUPER B COMPLEX PO) Take 1 capsule by mouth daily.    Yes [provider]  Calcium-Magnesium-Vitamin D (CALCIUM 1200+D3 PO) Take 2 tablets by mouth in the morning and at bedtime.   Yes [provider]  cetirizine (ZYRTEC) 10 MG tablet Take 10 mg by mouth daily as needed for allergies.   Yes [provider]  denosumab (XGEVA) 120 MG/1.7ML SOLN injection Inject 120 mg into the skin every 3 (three) months.   Yes [provider]  ELIQUIS 5 MG TABS tablet Take 5 mg by mouth 2 (two) times daily. 11/04/19  Yes [provider]  glipiZIDE (GLUCOTROL XL) 10 MG 24 hr tablet Take 10 mg by mouth daily. 01/31/21  Yes [provider]  hydrocortisone (CORTEF) 10 MG tablet Take 10 mg by mouth 2 (two) times daily.  10/13/19  Yes [provider]  Iron, Ferrous Sulfate, 325 (65 Fe) MG TABS daily. 10/11/20  Yes [provider]  Leuprolide Acetate (ELIGARD Xenia) Inject into the skin. 1 injection every 6 months.   Yes [provider]   levothyroxine (SYNTHROID) 50 MCG tablet Take 50 mcg by mouth daily before breakfast. 11/26/19  Yes [provider]  melatonin 5 MG TABS Take 10 mg by mouth at bedtime.   Yes [provider]  Misc Natural Products (OSTEO BI-FLEX ADV JOINT SHIELD PO) Take 1 tablet by mouth daily.    Yes [provider]  Multiple Vitamin (MULTIVITAMIN WITH MINERALS) TABS tablet Take 1 tablet by mouth daily. One-A-Day Men's 50+   Yes [provider]  polyethylene glycol (MIRALAX / GLYCOLAX) 17 g packet Take 17 g by mouth daily. W/coffee   Yes [provider]  Probiotic Product (ALIGN) 4 MG CAPS Take 4 mg by mouth daily.    Yes [provider]  triamcinolone cream (KENALOG) 0.1 % Apply 1 application topically as directed. Qd to bid up to 5 days per week to right hand until clear, then prn flares, avoid face, groin, axilla 10/31/21  Yes Brendolyn Patty, MD  valsartan (DIOVAN) 320 MG tablet Take 160 mg by mouth 2 (two) times daily. 11/18/19  Yes [provider]  ACCU-CHEK GUIDE test strip daily. use as directed 02/02/21   [provider]  Accu-Chek Softclix Lancets lancets daily. 02/02/21   [provider]  Blood Glucose Monitoring Suppl (ACCU-CHEK GUIDE) w/Device KIT See admin instructions. 02/02/21   [provider]  Trospium Chloride 60 MG CP24 Take 1 capsule (60 mg total) by mouth daily. Patient not taking: Reported on 11/18/2021 11/10/21   Billey Co, MD    Allergies Metoprolol and Rivaroxaban  Family History  Problem Relation Age of Onset   Stroke Mother    Heart attack Father    Leukemia Sister 65   Lung cancer Brother 77    Social History Social History   Tobacco Use   Smoking status: Former   Smokeless tobacco: Never   Tobacco comments:    quit 50 years ago  Vaping Use   Vaping Use: Never used  Substance Use Topics   Alcohol use: Yes    Comment: rarely   Drug use: Never    Review of  Systems Constitutional: No fever. Eyes: No visual changes. ENT: No sore throat. Cardiovascular: + chest pain. Respiratory: + shortness of breath. Gastrointestinal: No nausea, vomiting, diarrhea. Genitourinary: Negative for dysuria. Musculoskeletal: Negative for back pain. Skin: Negative for rash. Neurological: Negative for focal weakness or numbness.  ____________________________________________   PHYSICAL EXAM:  VITAL SIGNS: ED Triage Vitals  Enc Vitals Group     BP 11/18/21 0010  131/72     Pulse Rate 11/18/21 0010 70     Resp 11/18/21 0010 20     Temp 11/18/21 0010 99.2 F (37.3 C)     Temp Source 11/18/21 0010 Rectal     SpO2 11/18/21 0010 96 %     Weight --      Height --      Head Circumference --      Peak Flow --      Pain Score 11/18/21 0016 3     Pain Loc --      Pain Edu? --      Excl. in [place]? --    CONSTITUTIONAL: Alert and oriented and responds appropriately to questions. Well-appearing; well-nourished, elderly HEAD: Normocephalic EYES: Conjunctivae clear, pupils appear equal, EOM appear intact ENT: normal nose; moist mucous membranes NECK: Supple, normal ROM CARD: RRR; S1 and S2 appreciated; no murmurs, no clicks, no rubs, no gallops RESP: Normal chest excursion without splinting or tachypnea; breath sounds clear and equal bilaterally; no wheezes, no rhonchi, no rales, no hypoxia or respiratory distress, speaking full sentences ABD/GI: Normal bowel sounds; non-distended; soft, non-tender, no rebound, no guarding, no peritoneal signs, no hepatosplenomegaly BACK: The back appears normal EXT: Normal ROM in all joints; no deformity noted, no edema; no cyanosis, extremities warm and well-perfused, no calf tenderness or calf swelling SKIN: Normal color for age and race; warm; no rash on exposed skin NEURO: Moves all extremities equally PSYCH: The patient's mood and manner are appropriate.  ____________________________________________   LABS (all labs ordered  are listed, but only abnormal results are displayed)  Labs Reviewed  CBC WITH DIFFERENTIAL/PLATELET - Abnormal; Notable for the following components:      Result Value   WBC 11.1 (*)    All other components within normal limits  BASIC METABOLIC PANEL - Abnormal; Notable for the following components:   Glucose, Bld 189 (*)    All other components within normal limits  APTT - Abnormal; Notable for the following components:   aPTT 38 (*)    All other components within normal limits  HEPARIN LEVEL (UNFRACTIONATED) - Abnormal; Notable for the following components:   Heparin Unfractionated >1.10 (*)    All other components within normal limits  TROPONIN I (HIGH SENSITIVITY) - Abnormal; Notable for the following components:   Troponin I (High Sensitivity) 75 (*)    All other components within normal limits  RESP PANEL BY RT-PCR (FLU A&B, COVID) ARPGX2  MAGNESIUM  TSH  T4, FREE  PROTIME-INR  HEMOGLOBIN A1C  APTT  TROPONIN I (HIGH SENSITIVITY)   ____________________________________________  EKG    Date: 11/18/2021  00:07  Rate: 90  Rhythm: normal sinus rhythm  QRS Axis: normal  Intervals: Prolonged QT interval of 509 ms, borderline prolonged PR interval of 222 ms  ST/T Wave abnormalities: T wave inversions in lateral leads  Conduction Disutrbances: Intraventricular conduction delay  Narrative Interpretation: Frequent PACs, LVH, interventricular conduction delay, slightly prolonged QT interval of 509 ms    ____________________________________________  RADIOLOGY I, Kassidy Dockendorf, personally viewed and evaluated these images (plain radiographs) as part of my medical decision making, as well as reviewing the written report by the radiologist.  ED MD interpretation: Chest x-ray clear.  Official radiology report(s): DG Chest Portable 1 View  Result Date: 11/18/2021 CLINICAL DATA:  Shortness of breath and chest pain, initial encounter EXAM: PORTABLE CHEST 1 VIEW COMPARISON:   None. FINDINGS: The heart size and mediastinal contours are within normal limits. Both lungs  are clear. The visualized skeletal structures are unremarkable. Postsurgical changes in the cervical spine are noted. IMPRESSION: No active disease. Electronically Signed   By: Inez Catalina M.D.   On: 11/18/2021 00:45    ____________________________________________   PROCEDURES  Procedure(s) performed (including Critical Care):  Procedures  CRITICAL CARE Performed by: Cyril Mourning Kert Shackett   Total critical care time: 45 minutes  Critical care time was exclusive of separately billable procedures and treating other patients.  Critical care was necessary to treat or prevent imminent or life-threatening deterioration.  Critical care was time spent personally by me on the following activities: development of treatment plan with patient and/or surrogate as well as nursing, discussions with consultants, evaluation of patient's response to treatment, examination of patient, obtaining history from patient or surrogate, ordering and performing treatments and interventions, ordering and review of laboratory studies, ordering and review of radiographic studies, pulse oximetry and re-evaluation of patient's condition.  ____________________________________________   INITIAL IMPRESSION / ASSESSMENT AND PLAN / ED COURSE  As part of my medical decision making, I reviewed the following data within the [place] History obtained from family, Nursing notes reviewed and incorporated, Labs reviewed , EKG interpreted , Old EKG reviewed, Old chart reviewed, Radiograph reviewed , Discussed with admitting physician , and Notes from prior ED visits         Patient here with complaints of chest pain that radiated into both arms and shortness of breath.  Was found to be in A. fib with RVR and hypotensive.  On arrival, patient spontaneously converted into a sinus tachycardia with occasional PACs.  Blood pressure  improved immediately.  Still feeling symptomatic with chest pressure and bilateral arm pain.  Given hypotension with EMS, will avoid nitroglycerin.  Differential includes demand ischemia from A. fib with RVR, ACS, PE, dissection, CHF.  Denies any recent infectious symptoms.  Does not appear dehydrated.  ED PROGRESS  Patient's labs are reassuring including normal electrolytes, TSH and free T4.  First troponin is normal at 11.  States he is still having some discomfort in his arms despite heart rate now being in the upper 50s, low 60s.  Will give fentanyl.  Second troponin pending.  Chest x-ray clear.    Patient second troponin has come back at 75.  I have recommended that he be monitored in the hospital given his troponin is trending up and he is still having pressure and heaviness in both arms however this troponin elevation may be due to demand ischemia from being so tachycardic.  I have recommended admission to the hospital.  Patient and wife are comfortable with this plan.  We discussed that cardiology may see him while in the hospital discuss if he should be started on any rate controlling medications.  He has not required any IV antiarrhythmics in the ED.   Discussed patient's case with hospitalist, Dr. Damita Dunnings.  I have recommended admission and patient (and family if present) agree with this plan. Admitting physician will place admission orders.   I reviewed all nursing notes, vitals, pertinent previous records and reviewed/interpreted all EKGs, lab and urine results, imaging (as available).   Hospitalist recommends starting heparin.  Patient's blood pressures continue to be stable.  Heart rate in the 50s which he reports is his baseline.  Likely why he was not able to tolerate metoprolol before. ____________________________________________   FINAL CLINICAL IMPRESSION(S) / ED DIAGNOSES  Final diagnoses:  Atrial fibrillation with RVR (HCC)  Nonspecific chest pain  Elevated troponin  ED Discharge Orders     None       *Please note:  Eufemio Strahm was evaluated in Emergency Department on 11/18/2021 for the symptoms described in the history of present illness. He was evaluated in the context of the global COVID-19 pandemic, which necessitated consideration that the patient might be at risk for infection with the SARS-CoV-2 virus that causes COVID-19. Institutional protocols and algorithms that pertain to the evaluation of patients at risk for COVID-19 are in a state of rapid change based on information released by regulatory bodies including the CDC and federal and state organizations. These policies and algorithms were followed during the patient's care in the ED.  Some ED evaluations and interventions may be delayed as a result of limited staffing during and the pandemic.*   Note:  This document was prepared using Dragon voice recognition software and may include unintentional dictation errors.    Novelle Addair, Delice Bison, DO 11/18/21 [place], Delice Bison, DO 11/18/21 [phone]

## 2021-11-18 NOTE — Progress Notes (Signed)
PROGRESS NOTE    Andrew Callahan  DXI:[id number] DOB: 10/20/1934 DOA: 11/18/2021 PCP: Rusty Aus, MD    Brief Narrative:  Andrew Callahan is a 87 y.o. male with medical history significant for Diastolic CHF, paroxysmal A. fib on Eliquis, hypothyroidism, essential hypertension, diabetes and adrenal insufficiency who presents to the ED with sudden onset chest pain described as retrosternal tightness of severe intensity radiating down bilateral arms associated with palpitations.  Denies shortness of breath, cough, fever or chills.  Denies nausea, vomiting, diaphoresis.  Denies lower extremity pain or swelling.  Patient was found to be in rapid A. fib by EMS but converted to sinus by arrival with rate in the 80s   ED course: Temperature 99.2 in sinus rhythm at 70.  otherwise   12/9 no cp or sob this am  Consultants:  Cardiology  Procedures:   Antimicrobials:      Subjective: No dizziness. No palpitations  Objective: Vitals:   11/18/21 0811 11/18/21 1033 11/18/21 1200 11/18/21 1238  BP: 125/68  (!) 154/71   Pulse: (!) 59 (!) 49 (!) 54   Resp: 19 15 17 15   Temp: 98.5 F (36.9 C)     TempSrc: Oral     SpO2: 98%  92%     Intake/Output Summary (Last 24 hours) at 11/18/2021 1509 Last data filed at 11/18/2021 0217 Gross per 24 hour  Intake 500 ml  Output --  Net 500 ml   There were no vitals filed for this visit.  Examination:  General exam: Appears calm and comfortable  Respiratory system: Clear to auscultation. Respiratory effort normal. Cardiovascular system: S1 & S2 heard, RRR. No JVD, murmurs, rubs, gallops or clicks.  Gastrointestinal system: Abdomen is nondistended, soft and nontender.  Normal bowel sounds heard. Central nervous system: Alert and oriented. Grossly intact Psychiatry: Mood & affect appropriate.     Data Reviewed: I have personally reviewed following labs and imaging studies  CBC: Recent Labs  Lab 11/18/21 0018  WBC 11.1*  NEUTROABS 6.5  HGB  13.9  HCT 41.9  MCV 88.2  PLT 767   Basic Metabolic Panel: Recent Labs  Lab 11/18/21 0018  NA 139  K 3.6  CL 105  CO2 24  GLUCOSE 189*  BUN 22  CREATININE 1.12  CALCIUM 9.2  MG 2.4   GFR: Estimated Creatinine Clearance: 58.8 mL/min (by C-G formula based on SCr of 1.12 mg/dL). Liver Function Tests: No results for input(s): AST, ALT, ALKPHOS, BILITOT, PROT, ALBUMIN in the last 168 hours. No results for input(s): LIPASE, AMYLASE in the last 168 hours. No results for input(s): AMMONIA in the last 168 hours. Coagulation Profile: Recent Labs  Lab 11/18/21 0407  INR 1.0   Cardiac Enzymes: No results for input(s): CKTOTAL, CKMB, CKMBINDEX, TROPONINI in the last 168 hours. BNP (last 3 results) No results for input(s): PROBNP in the last 8760 hours. HbA1C: No results for input(s): HGBA1C in the last 72 hours. CBG: Recent Labs  Lab 11/18/21 0749 11/18/21 1115  GLUCAP 116* 120*   Lipid Profile: No results for input(s): CHOL, HDL, LDLCALC, TRIG, CHOLHDL, LDLDIRECT in the last 72 hours. Thyroid Function Tests: Recent Labs    11/18/21 0018  TSH 4.046  FREET4 0.70   Anemia Panel: No results for input(s): VITAMINB12, FOLATE, FERRITIN, TIBC, IRON, RETICCTPCT in the last 72 hours. Sepsis Labs: No results for input(s): PROCALCITON, LATICACIDVEN in the last 168 hours.  Recent Results (from the past 240 hour(s))  Resp Panel by RT-PCR (Flu A&B,  Covid) Nasopharyngeal Swab     Status: None   Collection Time: 11/18/21  3:42 AM   Specimen: Nasopharyngeal Swab; Nasopharyngeal(NP) swabs in vial transport medium  Result Value Ref Range Status   SARS Coronavirus 2 by RT PCR NEGATIVE NEGATIVE Final    Comment: (NOTE) SARS-CoV-2 target nucleic acids are NOT DETECTED.  The SARS-CoV-2 RNA is generally detectable in upper respiratory specimens during the acute phase of infection. The lowest concentration of SARS-CoV-2 viral copies this assay can detect is 138 copies/mL. A negative  result does not preclude SARS-Cov-2 infection and should not be used as the sole basis for treatment or other patient management decisions. A negative result may occur with  improper specimen collection/handling, submission of specimen other than nasopharyngeal swab, presence of viral mutation(s) within the areas targeted by this assay, and inadequate number of viral copies(<138 copies/mL). A negative result must be combined with clinical observations, patient history, and epidemiological information. The expected result is Negative.  Fact Sheet for Patients:  EntrepreneurPulse.com.au  Fact Sheet for Healthcare Providers:  IncredibleEmployment.be  This test is no t yet approved or cleared by the [place] FDA and  has been authorized for detection and/or diagnosis of SARS-CoV-2 by FDA under an Emergency Use Authorization (EUA). This EUA will remain  in effect (meaning this test can be used) for the duration of the COVID-19 declaration under Section 564(b)(1) of the Act, 21 U.S.C.section 360bbb-3(b)(1), unless the authorization is terminated  or revoked sooner.       Influenza A by PCR NEGATIVE NEGATIVE Final   Influenza B by PCR NEGATIVE NEGATIVE Final    Comment: (NOTE) The Xpert Xpress SARS-CoV-2/FLU/RSV plus assay is intended as an aid in the diagnosis of influenza from Nasopharyngeal swab specimens and should not be used as a sole basis for treatment. Nasal washings and aspirates are unacceptable for Xpert Xpress SARS-CoV-2/FLU/RSV testing.  Fact Sheet for Patients: EntrepreneurPulse.com.au  Fact Sheet for Healthcare Providers: IncredibleEmployment.be  This test is not yet approved or cleared by the [place] FDA and has been authorized for detection and/or diagnosis of SARS-CoV-2 by FDA under an Emergency Use Authorization (EUA). This EUA will remain in effect (meaning this test can be used)  for the duration of the COVID-19 declaration under Section 564(b)(1) of the Act, 21 U.S.C. section 360bbb-3(b)(1), unless the authorization is terminated or revoked.  Performed at [facility], [address] [place], [place] [address]          Radiology Studies: DG Chest Portable 1 View  Result Date: 11/18/2021 CLINICAL DATA:  Shortness of breath and chest pain, initial encounter EXAM: PORTABLE CHEST 1 VIEW COMPARISON:  None. FINDINGS: The heart size and mediastinal contours are within normal limits. Both lungs are clear. The visualized skeletal structures are unremarkable. Postsurgical changes in the cervical spine are noted. IMPRESSION: No active disease. Electronically Signed   By: Inez Catalina M.D.   On: 11/18/2021 00:45        Scheduled Meds:  [START ON 11/19/2021] aspirin EC  81 mg Oral Daily   hydrocortisone  10 mg Oral BID   insulin aspart  0-15 Units Subcutaneous TID WC   insulin aspart  0-5 Units Subcutaneous QHS   Continuous Infusions:  heparin 1,300 Units/hr (11/18/21 0413)    Assessment & Plan:   Principal Problem:   NSTEMI (non-ST elevated myocardial infarction) ([place]) Active Problems:   Acquired hypothyroidism   Anticoagulant long-term use   Benign essential hypertension   Paroxysmal atrial fibrillation  with rapid ventricular response (HCC)   Type 2 diabetes mellitus without complication, without long-term current use of insulin (HCC)   Secondary adrenal insufficiency (HCC)   Chronic diastolic CHF (congestive heart failure), NYHA class 3 (HCC)   NSTEMI (non-ST elevated myocardial infarction) ([place]) Cards consulted Echo pending Currently asx Continue heparin gtt tonight No avn blockers due to bradycardia Tp peaked now trending down   Paroxysmal atrial fibrillation with rapid ventricular response  Chronic anticoagulation - Patient currently in sinus bradycardia  12/9on heparin gtt, will switch to eliquis on dc Echo pending With  brady-tachy need to evaluate for tachy-brady syndrome as outpatient, will need to setup with monitor as outpatient     Acquired hypothyroidism Continue levothyroxine     Benign essential hypertension Continue valsartan     Type 2 diabetes mellitus without complication, without long-term current use of insulin (HCC) - Continue sliding scale insulin     Secondary adrenal insufficiency (HCC) - Continue hydrocortisone     Chronic diastolic CHF (congestive heart failure), NYHA class 3 (HCC) Without acute exacerbation    DVT prophylaxis: heparin Code Status:full Family Communication: none at bedside Disposition Plan:  Status is: Observation  The patient remains OBS appropriate and will d/c before 2 midnights.           LOS: 0 days   Time spent: 35 min with >50% on coc    Nolberto Hanlon, MD [facility] Pager [phone]  If 7PM-7AM, please contact night-coverage 11/18/2021, 3:09 PM

## 2021-11-18 NOTE — Progress Notes (Signed)
Patient received to ED 31 via stretcher. Wife at bedside. Patient assisted with commode to have bowel movement but no results. Assisted back to bed. SB on monitor. NAD. No needs at this time.

## 2021-11-18 NOTE — Plan of Care (Signed)

## 2021-11-18 NOTE — H&P (Signed)
History and Physical    Andrew Callahan KTG:[id number] DOB: 01/24/1934 DOA: 11/18/2021  PCP: Rusty Aus, MD   Patient coming from: home  I have personally briefly reviewed patient's relevant medical records in [place]  Chief Complaint: chest pain, palpitations  HPI: Andrew Callahan is a 87 y.o. male with medical history significant for Diastolic CHF, paroxysmal A. fib on Eliquis, hypothyroidism, essential hypertension, diabetes and adrenal insufficiency who presents to the ED with sudden onset chest pain described as retrosternal tightness of severe intensity radiating down bilateral arms associated with palpitations.  Denies shortness of breath, cough, fever or chills.  Denies nausea, vomiting, diaphoresis.  Denies lower extremity pain or swelling.  Patient was found to be in rapid A. fib by EMS but converted to sinus by arrival with rate in the 80s  ED course: Temperature 99.2 in sinus rhythm at 70.  otherwise normal vitals Troponin 11-75 otherwise unremarkable  EKG sinus rhythm at 90 with no acute ST-T wave changes  Patient had improvement in chest pain since arrival but continued to complain of chest discomfort.  Started on a heparin infusion.  Hospitalist consulted for admission.   Review of Systems: As per HPI otherwise all other systems on review of systems negative.   Assessment/Plan    NSTEMI (non-ST elevated myocardial infarction) [facility]) - Patient with chest pain and uptrending troponin 11-75, likely demand ischemia from rapid A. fib.  EKG nonacute - Continue heparin infusion - Continue aspirin    Paroxysmal atrial fibrillation with rapid ventricular response  Chronic anticoagulation - Patient currently in sinus bradycardia  - On heparin infusion above.  Hold home Eliquis - Cardiology consult for recommendations    Acquired hypothyroidism - Continue levothyroxine    Benign essential hypertension - Continue valsartan    Type 2 diabetes mellitus without  complication, without long-term current use of insulin (HCC) - Continue sliding scale insulin    Secondary adrenal insufficiency (HCC) - Continue hydrocortisone    Chronic diastolic CHF (congestive heart failure), NYHA class 3 (HCC) -   DVT prophylaxis: On heparin infusion Code Status: full code  Family Communication:  wife at bedside  Disposition Plan: Back to previous home environment Consults called: paraschos Status:observation    Physical Exam: Vitals:   11/18/21 0052 11/18/21 0130 11/18/21 0230 11/18/21 0300  BP: 112/70 122/62 125/66 131/64  Pulse: 69 65 (!) 54 (!) 55  Resp: '16 13 17 18  ' Temp:      TempSrc:      SpO2: 96% 94% 95% 96%   Constitutional: Alert, oriented x 3 . Not in any apparent distress HEENT:      Head: Normocephalic and atraumatic.         Eyes: PERLA, EOMI, Conjunctivae are normal. Sclera is non-icteric.       Mouth/Throat: Mucous membranes are moist.       Neck: Supple with no signs of meningismus. Cardiovascular: sinus brady. No murmurs, gallops, or rubs. 2+ symmetrical distal pulses are present . No JVD. No  LE edema Respiratory: Respiratory effort normal .Lungs sounds clear bilaterally. No wheezes, crackles, or rhonchi.  Gastrointestinal: Soft, non tender, non distended. Positive bowel sounds.  Genitourinary: No CVA tenderness. Musculoskeletal: Nontender with normal range of motion in all extremities. No cyanosis, or erythema of extremities. Neurologic:  Face is symmetric. Moving all extremities. No gross focal neurologic deficits . Skin: Skin is warm, dry.  No rash or ulcers Psychiatric: Mood and affect are appropriate     Past Medical History:  Diagnosis Date   Acquired hypothyroidism    Aortic atherosclerosis ([place])    Atrial fibrillation (HCC)    Chronic constipation    CVA (cerebral vascular accident) ([place])    DDD (degenerative disc disease), cervical    Diabetes mellitus without complication ([place])    Diverticulosis    Elevated  prostate specific antigen (PSA)    Family history of leukemia    Family history of lung cancer    History of kidney stones    History of pituitary adenoma    Hyperlipidemia    Hypertension    Hypogonadism in male    Hypothyroid    Hypothyroidism    IDA (iron deficiency anemia)    Lumbar disc disease    Nephrolithiasis    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 Mohs.   Stroke ([place])    throat / slight difficulty swallowing    Past Surgical History:  Procedure Laterality Date   BACK SURGERY  1997   L3, 4, 5 laminectomy    BACK SURGERY  1998   hard wire removed, fusion of L3-5   BACK SURGERY  2009   laminotomy/foraminotomy w/ decompression of nerve root, facet thermal ablation L2-3.    COLONOSCOPY     COLONOSCOPY WITH PROPOFOL N/A 02/04/2021   Procedure: COLONOSCOPY WITH PROPOFOL;  Surgeon: Lesly Rubenstein, MD;  Location: [facility] ENDOSCOPY;  Service: Endoscopy;  Laterality: N/A;  STAT CBC BEFORE PROCEDURE   ESOPHAGOGASTRODUODENOSCOPY (EGD) WITH PROPOFOL N/A 02/04/2021   Procedure: ESOPHAGOGASTRODUODENOSCOPY (EGD) WITH PROPOFOL;  Surgeon: Lesly Rubenstein, MD;  Location: [facility] ENDOSCOPY;  Service: Endoscopy;  Laterality: N/A;   HERNIA REPAIR     HOLEP-LASER ENUCLEATION OF THE PROSTATE WITH MORCELLATION N/A 06/18/2020   Procedure: HOLEP-LASER ENUCLEATION OF THE PROSTATE WITH MORCELLATION;  Surgeon: Billey Co, MD;  Location: [facility] ORS;  Service: Urology;  Laterality: N/A;   JOINT REPLACEMENT     left knee partial   KIDNEY STONE SURGERY  2010   KNEE SURGERY Left 2007   Arthroscopic partial knee replacement, replaced medial side    MOHS SURGERY Right 2014   squamous cell carcinoma   NECK SURGERY  2008   fusion with hardwire in place C3 through C7    pituitary adenoma  1994   benign    PROSTATE BIOPSY N/A 01/01/2020   Procedure: PROSTATE BIOPSY URO NAV FUSION;  Surgeon: Royston Cowper, MD;  Location: [facility] ORS;  Service: Urology;  Laterality: N/A;   [place] SURGERY  2016   fusion L4    TONSILLECTOMY       reports that he has quit smoking. He has never used smokeless tobacco. He reports current alcohol use. He reports that he does not use drugs.  Allergies  Allergen Reactions   Metoprolol Other (See Comments)    dizziness    Rivaroxaban Other (See Comments)    hematuria     Family History  Problem Relation Age of Onset   Stroke Mother    Heart attack Father    Leukemia Sister 85   Lung cancer Brother 104      Prior to Admission medications   Medication Sig Start Date End Date Taking? Authorizing Provider  abiraterone acetate (ZYTIGA) 250 MG tablet TAKE 3  TABLETS (750 MG TOTAL) BY MOUTH DAILY. TAKE ON AN EMPTY STOMACH 1 HOUR BEFORE OR 2 HOURS AFTER A MEAL 10/19/21   Lloyd Huger, MD  ACCU-CHEK GUIDE test strip daily. use as directed 02/02/21   [provider]  Accu-Chek Softclix Lancets lancets daily. 02/02/21   [provider]  acyclovir ointment (ZOVIRAX) 5 % Apply 1 application topically every 3 (three) hours. Apply to aa groin prn flares q 3 hours until resolved 10/31/21   Brendolyn Patty, MD  B Complex-C (SUPER B COMPLEX PO) Take 1 capsule by mouth daily.     [provider]  Blood Glucose Monitoring Suppl (ACCU-CHEK GUIDE) w/Device KIT See admin instructions. 02/02/21   [provider]  Calcium-Magnesium-Vitamin D (CALCIUM 1200+D3 PO) Take 2 tablets by mouth daily.    [provider]  denosumab (XGEVA) 120 MG/1.7ML SOLN injection Inject 120 mg into the skin every 3 (three) months.    [provider]  ELIQUIS 5 MG TABS tablet Take 5 mg by mouth 2 (two) times daily. 11/04/19   [provider]  glipiZIDE (GLUCOTROL XL) 10 MG 24 hr tablet Take 10 mg by mouth daily. 01/31/21    [provider]  hydrocortisone (CORTEF) 10 MG tablet Take 10 mg by mouth 2 (two) times daily.  10/13/19   [provider]  Iron, Ferrous Sulfate, 325 (65 Fe) MG TABS daily. 10/11/20   [provider]  Leuprolide Acetate (ELIGARD [place]) Inject into the skin.    [provider]  levothyroxine (SYNTHROID) 50 MCG tablet Take 50 mcg by mouth daily before breakfast. 11/26/19   [provider]  Misc Natural Products (OSTEO BI-FLEX ADV JOINT SHIELD PO) Take 1 tablet by mouth daily.     [provider]  Multiple Vitamin (MULTIVITAMIN WITH MINERALS) TABS tablet Take 1 tablet by mouth daily. One-A-Day Men's 50+    [provider]  polyethylene glycol (MIRALAX / GLYCOLAX) 17 g packet Take 17 g by mouth daily. W/coffee    [provider]  Probiotic Product (ALIGN) 4 MG CAPS Take 4 mg by mouth daily.     [provider]  triamcinolone cream (KENALOG) 0.1 % Apply 1 application topically as directed. Qd to bid up to 5 days per week to right hand until clear, then prn flares, avoid face, groin, axilla 10/31/21   Brendolyn Patty, MD  Trospium Chloride 60 MG CP24 Take 1 capsule (60 mg total) by mouth daily. 11/10/21   Billey Co, MD  valsartan (DIOVAN) 320 MG tablet Take 160 mg by mouth 2 (two) times daily. 11/18/19   [provider]      Labs on Admission: I have personally reviewed following labs and imaging studies  CBC: Recent Labs  Lab 11/18/21 0018  WBC 11.1*  NEUTROABS 6.5  HGB 13.9  HCT 41.9  MCV 88.2  PLT 665   Basic Metabolic Panel: Recent Labs  Lab 11/18/21 0018  NA 139  K 3.6  CL 105  CO2 24  GLUCOSE 189*  BUN 22  CREATININE 1.12  CALCIUM 9.2  MG 2.4   GFR: Estimated Creatinine Clearance: 58.8 mL/min (by C-G formula based on SCr of 1.12 mg/dL). Liver Function Tests: No results for input(s): AST, ALT, ALKPHOS, BILITOT, PROT, ALBUMIN in the last 168 hours. No results for input(s): LIPASE,  AMYLASE in the last 168 hours. No results for input(s): AMMONIA in the last 168 hours. Coagulation Profile: No results for input(s): INR, PROTIME in the last  168 hours. Cardiac Enzymes: No results for input(s): CKTOTAL, CKMB, CKMBINDEX, TROPONINI in the last 168 hours. BNP (last 3 results) No results for input(s): PROBNP in the last 8760 hours. HbA1C: No results for input(s): HGBA1C in the last 72 hours. CBG: No results for input(s): GLUCAP in the last 168 hours. Lipid Profile: No results for input(s): CHOL, HDL, LDLCALC, TRIG, CHOLHDL, LDLDIRECT in the last 72 hours. Thyroid Function Tests: Recent Labs    11/18/21 0018  TSH 4.046  FREET4 0.70   Anemia Panel: No results for input(s): VITAMINB12, FOLATE, FERRITIN, TIBC, IRON, RETICCTPCT in the last 72 hours. Urine analysis:    Component Value Date/Time   COLORURINE STRAW (A) 01/31/2020 0505   APPEARANCEUR Hazy (A) 02/16/2021 0943   LABSPEC 1.010 01/31/2020 0505   PHURINE 6.0 01/31/2020 0505   GLUCOSEU Negative 02/16/2021 0943   HGBUR NEGATIVE 01/31/2020 0505   BILIRUBINUR Negative 02/16/2021 0943   KETONESUR NEGATIVE 01/31/2020 0505   PROTEINUR Negative 02/16/2021 0943   PROTEINUR NEGATIVE 01/31/2020 0505   NITRITE Positive (A) 02/16/2021 0943   NITRITE NEGATIVE 01/31/2020 0505   LEUKOCYTESUR 1+ (A) 02/16/2021 0943   LEUKOCYTESUR NEGATIVE 01/31/2020 0505    Radiological Exams on Admission: DG Chest Portable 1 View  Result Date: 11/18/2021 CLINICAL DATA:  Shortness of breath and chest pain, initial encounter EXAM: PORTABLE CHEST 1 VIEW COMPARISON:  None. FINDINGS: The heart size and mediastinal contours are within normal limits. Both lungs are clear. The visualized skeletal structures are unremarkable. Postsurgical changes in the cervical spine are noted. IMPRESSION: No active disease. Electronically Signed   By: Inez Catalina M.D.   On: 11/18/2021 00:45       Athena Masse MD [facility]   11/18/2021, 3:42  AM

## 2021-11-18 NOTE — Progress Notes (Signed)
ANTICOAGULATION CONSULT NOTE - Initial Consult  Pharmacy Consult for Heparin  Indication: chest pain/ACS  Allergies  Allergen Reactions   Metoprolol Other (See Comments)    dizziness    Rivaroxaban Other (See Comments)    hematuria     Patient Measurements:   Heparin Dosing Weight: 94.5 kg   Vital Signs: Temp: 98.1 F (36.7 C) (12/09 2023) Temp Source: Oral (12/09 2023) BP: 179/69 (12/09 2023) Pulse Rate: 51 (12/09 2023)  Labs: Recent Labs    11/18/21 0018 11/18/21 0219 11/18/21 0407 11/18/21 0903 11/18/21 1151 11/18/21 1314 11/18/21 2043  HGB 13.9  --   --   --   --   --   --   HCT 41.9  --   --   --   --   --   --   PLT 203  --   --   --   --   --   --   APTT  --   --  38*  --   --  73* 78*  LABPROT  --   --  13.2  --   --   --   --   INR  --   --  1.0  --   --   --   --   HEPARINUNFRC  --   --  >1.10*  --   --   --   --   CREATININE 1.12  --   --   --   --   --   --   TROPONINIHS 11 75*  --  188* 160*  --   --      Estimated Creatinine Clearance: 58.8 mL/min (by C-G formula based on SCr of 1.12 mg/dL).   Medical History: Past Medical History:  Diagnosis Date   Acquired hypothyroidism    Aortic atherosclerosis (HCC)    Atrial fibrillation (HCC)    Chronic constipation    CVA (cerebral vascular accident) ([place])    DDD (degenerative disc disease), cervical    Diabetes mellitus without complication ([place])    Diverticulosis    Elevated prostate specific antigen (PSA)    Family history of leukemia    Family history of lung cancer    History of kidney stones    History of pituitary adenoma    Hyperlipidemia    Hypertension    Hypogonadism in male    Hypothyroid    Hypothyroidism    IDA (iron deficiency anemia)    Lumbar disc disease    Nephrolithiasis    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 Mohs.   Stroke ([place])    throat / slight difficulty swallowing    Medications:  Medications Prior to Admission  Medication Sig Dispense Refill Last Dose   abiraterone acetate (ZYTIGA) 250 MG tablet TAKE 3 TABLETS (750 MG TOTAL) BY MOUTH DAILY. TAKE ON AN EMPTY STOMACH 1 HOUR BEFORE OR 2 HOURS AFTER A MEAL 90 tablet 0 11/17/2021 at 1630   acyclovir ointment (ZOVIRAX) 5 % Apply 1 application topically every 3 (three) hours. Apply to aa groin prn flares q 3 hours until resolved 30 g 11 11/17/2021   B Complex-C (SUPER B COMPLEX PO) Take 1 capsule by mouth daily.    11/17/2021 at 0800   Calcium-Magnesium-Vitamin  D (CALCIUM 1200+D3 PO) Take 2 tablets by mouth in the morning and at bedtime.   11/17/2021 at 1830   cetirizine (ZYRTEC) 10 MG tablet Take 10 mg by mouth daily as needed for allergies.   prn at prn   denosumab (XGEVA) 120 MG/1.7ML SOLN injection Inject 120 mg into the skin every 3 (three) months.   09/01/2021 at unknown   ELIQUIS 5 MG TABS tablet Take 5 mg by mouth 2 (two) times daily.   11/17/2021 at 2200   glipiZIDE (GLUCOTROL XL) 10 MG 24 hr tablet Take 10 mg by mouth daily.   11/17/2021 at 0800   hydrocortisone (CORTEF) 10 MG tablet Take 10 mg by mouth 2 (two) times daily.    11/17/2021 at 2200   Iron, Ferrous Sulfate, 325 (65 Fe) MG TABS daily.   11/17/2021 at 1830   Leuprolide Acetate (ELIGARD [place]) Inject into the skin. 1 injection every 6 months.   unknown at unknown   levothyroxine (SYNTHROID) 50 MCG tablet Take 50 mcg by mouth daily before breakfast.   11/17/2021 at 0630   melatonin 5 MG TABS Take 10 mg by mouth at bedtime.   11/17/2021 at 2200   Misc Natural Products (OSTEO BI-FLEX ADV JOINT SHIELD PO) Take 1 tablet by mouth daily.    11/17/2021 at 1830   Multiple Vitamin (MULTIVITAMIN WITH MINERALS) TABS tablet Take 1 tablet by mouth daily. One-A-Day Men's 50+   11/17/2021 at 1830   polyethylene glycol (MIRALAX / GLYCOLAX) 17 g packet Take 17 g by mouth  daily. W/coffee   11/17/2021 at 0800   Probiotic Product (ALIGN) 4 MG CAPS Take 4 mg by mouth daily.    11/17/2021 at 2200   triamcinolone cream (KENALOG) 0.1 % Apply 1 application topically as directed. Qd to bid up to 5 days per week to right hand until clear, then prn flares, avoid face, groin, axilla 80 g 1 prn at prn   valsartan (DIOVAN) 320 MG tablet Take 160 mg by mouth 2 (two) times daily.   11/17/2021 at [place] test strip daily. use as directed      Accu-Chek Softclix Lancets lancets daily.      Blood Glucose Monitoring Suppl (ACCU-CHEK GUIDE) w/Device KIT See admin instructions.      Trospium Chloride 60 MG CP24 Take 1 capsule (60 mg total) by mouth daily. (Patient not taking: Reported on 11/18/2021) 30 capsule 11 Not Taking    Assessment: Pharmacy consulted to dose heparin in this 87 yr old male admitted with ACS/NSTEMI.  Pt was on Eliquis 5 mg PO BID PTA, unsure of last dose.  CrCl = 58.8 ml/min   Date Time HL/aPTT Rate/Comment  12/9 1314 aPTT = 73s Therapeutic x 1 @ 1300 units/hr 12/9 2043 aPTT = 78s Therapeutic x 2  Goal of Therapy:  Heparin level 0.3-0.7 units/ml aPTT 66 - 102  seconds Monitor platelets by anticoagulation protocol: Yes   Plan:  aPTT 78, therapeutic  Continue heparin infusion @ 1300 units/hr Will use aPTT to guide dosing until aPTT and HL correlate. Recheck aPTT and HL with AM labs  Will check CBC daily   Darnelle Bos, PharmD Clinical Pharmacist   11/18/2021,9:30 PM

## 2021-11-18 NOTE — Progress Notes (Signed)
ANTICOAGULATION CONSULT NOTE - Initial Consult  Pharmacy Consult for Heparin  Indication: chest pain/ACS  Allergies  Allergen Reactions   Metoprolol Other (See Comments)    dizziness    Rivaroxaban Other (See Comments)    hematuria     Patient Measurements:   Heparin Dosing Weight: 94.5 kg   Vital Signs: Temp: 99.2 F (37.3 C) (12/09 0010) Temp Source: Rectal (12/09 0010) BP: 131/64 (12/09 0300) Pulse Rate: 55 (12/09 0300)  Labs: Recent Labs    11/18/21 0018 11/18/21 0219  HGB 13.9  --   HCT 41.9  --   PLT 203  --   CREATININE 1.12  --   TROPONINIHS 11 75*    Estimated Creatinine Clearance: 58.8 mL/min (by C-G formula based on SCr of 1.12 mg/dL).   Medical History: Past Medical History:  Diagnosis Date   Acquired hypothyroidism    Aortic atherosclerosis (HCC)    Atrial fibrillation (HCC)    Chronic constipation    CVA (cerebral vascular accident) ([place])    DDD (degenerative disc disease), cervical    Diabetes mellitus without complication ([place])    Diverticulosis    Elevated prostate specific antigen (PSA)    Family history of leukemia    Family history of lung cancer    History of kidney stones    History of pituitary adenoma    Hyperlipidemia    Hypertension    Hypogonadism in male    Hypothyroid    Hypothyroidism    IDA (iron deficiency anemia)    Lumbar disc disease    Nephrolithiasis    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 Mohs.   Stroke ([place])    throat / slight difficulty swallowing    Medications:  (Not in a hospital admission)   Assessment: Pharmacy consulted to dose heparin in this 87 yr old male admitted with ACS/NSTEMI.  Pt was on Eliquis 5 mg PO BID PTA, unsure of last dose.  CrCl = 58.8 ml/min   Goal of Therapy:  Heparin level  0.3-0.7 units/ml aPTT 66 - 102  seconds Monitor platelets by anticoagulation protocol: Yes   Plan:  Will order Heparin drip to start at 1300 units/hr.  No bolus since pt was on Eliquis PTA and most likely had a dose on 12/8 PM. Will use aPTT to guide dosing until aPTT and HL correlate. Will draw aPTT 8 hrs after start of drip. Will check CBC daily .   Lliam Hoh D 11/18/2021,3:53 AM

## 2021-11-18 NOTE — Progress Notes (Signed)
ANTICOAGULATION CONSULT NOTE - Initial Consult  Pharmacy Consult for Heparin  Indication: chest pain/ACS  Allergies  Allergen Reactions   Metoprolol Other (See Comments)    dizziness    Rivaroxaban Other (See Comments)    hematuria     Patient Measurements:   Heparin Dosing Weight: 94.5 kg   Vital Signs: Temp: 98.5 F (36.9 C) (12/09 0811) Temp Source: Oral (12/09 0811) BP: 154/71 (12/09 1200) Pulse Rate: 54 (12/09 1200)  Labs: Recent Labs    11/18/21 0018 11/18/21 0219 11/18/21 0407 11/18/21 0903 11/18/21 1151 11/18/21 1314  HGB 13.9  --   --   --   --   --   HCT 41.9  --   --   --   --   --   PLT 203  --   --   --   --   --   APTT  --   --  38*  --   --  73*  LABPROT  --   --  13.2  --   --   --   INR  --   --  1.0  --   --   --   HEPARINUNFRC  --   --  >1.10*  --   --   --   CREATININE 1.12  --   --   --   --   --   TROPONINIHS 11 75*  --  188* 160*  --      Estimated Creatinine Clearance: 58.8 mL/min (by C-G formula based on SCr of 1.12 mg/dL).   Medical History: Past Medical History:  Diagnosis Date   Acquired hypothyroidism    Aortic atherosclerosis (HCC)    Atrial fibrillation (HCC)    Chronic constipation    CVA (cerebral vascular accident) ([place])    DDD (degenerative disc disease), cervical    Diabetes mellitus without complication ([place])    Diverticulosis    Elevated prostate specific antigen (PSA)    Family history of leukemia    Family history of lung cancer    History of kidney stones    History of pituitary adenoma    Hyperlipidemia    Hypertension    Hypogonadism in male    Hypothyroid    Hypothyroidism    IDA (iron deficiency anemia)    Lumbar disc disease    Nephrolithiasis    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 Mohs.   Stroke  ([place])    throat / slight difficulty swallowing    Medications:  (Not in a hospital admission)  Assessment: Pharmacy consulted to dose heparin in this 87 yr old male admitted with ACS/NSTEMI.  Pt was on Eliquis 5 mg PO BID PTA, unsure of last dose.  CrCl = 58.8 ml/min   Date Time HL/aPTT Rate/Comment  12/9 1314 aPTT = 73s Therapeutic x 1 @ 1300 units/hr  Goal of Therapy:  Heparin level 0.3-0.7 units/ml aPTT 66 - 102  seconds Monitor platelets by anticoagulation protocol: Yes   Plan:  Heparin therapeutic x 1 Continue heparin infusion @ 1300 units/hr Will use aPTT to guide dosing until aPTT and HL correlate. Recheck confirmatory aPTT in 8 hours  Will check CBC daily .   Dorothe Pea, PharmD, BCPS Clinical Pharmacist   11/18/2021,2:12  PM   

## 2021-11-19 ENCOUNTER — Observation Stay
Admit: 2021-11-19 | Discharge: 2021-11-19 | Disposition: A | Discharge status: Home / Self Care | Payer: Medicare Other | Attending: Internal Medicine | Admitting: Internal Medicine

## 2021-11-19 DIAGNOSIS — I214 Non-ST elevation (NSTEMI) myocardial infarction: Secondary | ICD-10-CM | POA: Diagnosis not present

## 2021-11-19 LAB — GLUCOSE, CAPILLARY
Glucose-Capillary: 83 mg/dL (ref 70–99)
Glucose-Capillary: 128 mg/dL — ABNORMAL HIGH (ref 70–99)
Glucose-Capillary: 117 mg/dL — ABNORMAL HIGH (ref 70–99)

## 2021-11-19 LAB — CBC
HCT: 38.1 % — ABNORMAL LOW (ref 39.0–52.0)
Hemoglobin: 12.6 g/dL — ABNORMAL LOW (ref 13.0–17.0)
MCH: 29.1 pg (ref 26.0–34.0)
MCHC: 33.1 g/dL (ref 30.0–36.0)
MCV: 88 fL (ref 80.0–100.0)
Platelets: 170 10*3/uL (ref 150–400)
RBC: 4.33 MIL/uL (ref 4.22–5.81)
RDW: 15.1 % (ref 11.5–15.5)
WBC: 8.1 10*3/uL (ref 4.0–10.5)
nRBC: 0 % (ref 0.0–0.2)

## 2021-11-19 LAB — ECHOCARDIOGRAM COMPLETE
AR max vel: 3.2 cm2
AV Peak grad: 7.1 mmHg
Ao pk vel: 1.33 m/s
Area-P 1/2: 2.79 cm2
S' Lateral: 3.2 cm

## 2021-11-19 LAB — APTT: aPTT: 103 seconds — ABNORMAL HIGH (ref 24–36)

## 2021-11-19 LAB — HEPARIN LEVEL (UNFRACTIONATED): Heparin Unfractionated: >1.1 IU/mL — ABNORMAL HIGH (ref 0.30–0.70)

## 2021-11-19 LAB — HEMOGLOBIN A1C
Hgb A1c MFr Bld: 5.9 % — ABNORMAL HIGH (ref 4.8–5.6)
Mean Plasma Glucose: 123 mg/dL

## 2021-11-19 MED ORDER — ASPIRIN 81 MG PO TBEC: 81.0000 mg | DELAYED_RELEASE_TABLET | Freq: Every day | ORAL | 0 refills | Status: AC

## 2021-11-19 MED ORDER — APIXABAN 5 MG PO TABS
5.0000 mg | ORAL_TABLET | Freq: Two times a day (BID) | ORAL | Status: DC
  Administered 2021-11-19: 5 mg via ORAL
  Filled 2021-11-19: qty 1

## 2021-11-19 NOTE — Progress Notes (Signed)
*  PRELIMINARY RESULTS* Echocardiogram 2D Echocardiogram has been performed.  Andrew Callahan 11/19/2021, 3:08 PM

## 2021-11-19 NOTE — Progress Notes (Signed)
[facility] Cardiology  SUBJECTIVE: Patient laying in bed, denies chest pain or shortness of breath   Vitals:   11/18/21 2023 11/18/21 2343 11/19/21 0432 11/19/21 0818  BP: (!) 179/69 (!) 164/71 (!) 197/78 (!) 177/81  Pulse: (!) 51 (!) 58 (!) 53 (!) 54  Resp: 18 18 18 18   Temp: 98.1 F (36.7 C) 97.8 F (36.6 C) 98.3 F (36.8 C) 98.1 F (36.7 C)  TempSrc: Oral   Oral  SpO2: 98% 95% 99% 99%     Intake/Output Summary (Last 24 hours) at 11/19/2021 0836 Last data filed at 11/19/2021 8366 Gross per 24 hour  Intake 240.62 ml  Output 1200 ml  Net -959.38 ml      PHYSICAL EXAM  General: Well developed, well nourished, in no acute distress HEENT:  Normocephalic and atramatic Neck:  No JVD.  Lungs: Clear bilaterally to auscultation and percussion. Heart: HRRR . Normal S1 and S2 without gallops or murmurs.  Abdomen: Bowel sounds are positive, abdomen soft and non-tender  Msk:  Back normal, normal gait. Normal strength and tone for age. Extremities: No clubbing, cyanosis or edema.   Neuro: Alert and oriented X 3. Psych:  Good affect, responds appropriately   LABS: Basic Metabolic Panel: Recent Labs    11/18/21 0018  NA 139  K 3.6  CL 105  CO2 24  GLUCOSE 189*  BUN 22  CREATININE 1.12  CALCIUM 9.2  MG 2.4   Liver Function Tests: No results for input(s): AST, ALT, ALKPHOS, BILITOT, PROT, ALBUMIN in the last 72 hours. No results for input(s): LIPASE, AMYLASE in the last 72 hours. CBC: Recent Labs    11/18/21 0018 11/19/21 0544  WBC 11.1* 8.1  NEUTROABS 6.5  --   HGB 13.9 12.6*  HCT 41.9 38.1*  MCV 88.2 88.0  PLT 203 170   Cardiac Enzymes: No results for input(s): CKTOTAL, CKMB, CKMBINDEX, TROPONINI in the last 72 hours. BNP: Invalid input(s): POCBNP D-Dimer: No results for input(s): DDIMER in the last 72 hours. Hemoglobin A1C: Recent Labs    11/18/21 0407  HGBA1C 5.9*   Fasting Lipid Panel: No results for input(s): CHOL, HDL, LDLCALC, TRIG, CHOLHDL,  LDLDIRECT in the last 72 hours. Thyroid Function Tests: Recent Labs    11/18/21 0018  TSH 4.046   Anemia Panel: No results for input(s): VITAMINB12, FOLATE, FERRITIN, TIBC, IRON, RETICCTPCT in the last 72 hours.  DG Chest Portable 1 View  Result Date: 11/18/2021 CLINICAL DATA:  Shortness of breath and chest pain, initial encounter EXAM: PORTABLE CHEST 1 VIEW COMPARISON:  None. FINDINGS: The heart size and mediastinal contours are within normal limits. Both lungs are clear. The visualized skeletal structures are unremarkable. Postsurgical changes in the cervical spine are noted. IMPRESSION: No active disease. Electronically Signed   By: Inez Catalina M.D.   On: 11/18/2021 00:45     Echo pending  TELEMETRY: Sinus bradycardia 54 bpm:  ASSESSMENT AND PLAN:  Principal Problem:   NSTEMI (non-ST elevated myocardial infarction) ([place]) Active Problems:   Acquired hypothyroidism   Anticoagulant long-term use   Benign essential hypertension   Paroxysmal atrial fibrillation with rapid ventricular response (HCC)   Type 2 diabetes mellitus without complication, without long-term current use of insulin (HCC)   Secondary adrenal insufficiency (HCC)   Chronic diastolic CHF (congestive heart failure), NYHA class 3 (HCC)    Atrial fibrillation with RVR, spontaneously converted to sinus rhythm. History of paroxysmal atrial fibrillation, on Eliquis for stroke prevention. Currently on heparin drip. Not currently  on AV nodal blocking agents or antiarrhythmics at home.  Telemetry reveals sinus bradycardia 54 bpm. Elevated troponin (11, 75, 88, 160), with initial chest pain that has since resolved, with unremarkable ECG, likely demand supply ischemia. Currently on heparin drip. Chronic diastolic CHF, appears euvolemic History of stroke, while off of anticoagulation Hypertension, on home valsartan  Recommendations  DC heparin Defer AV nodal blocking agents due to bradycardia Defer antiarrhythmics at  this time Consider prescribing metoprolol tartrate at discharge for PRN use for recurrent persistent palpitations Continue valsartan  Resume Eliquis Review 2D echocardiogram Likely discharge later today 9.  Follow-up Dr. Corky Sox in 1 week   Isaias Cowman, MD, PhD, [facility] 11/19/2021 8:36 AM

## 2021-11-19 NOTE — Discharge Summary (Signed)
Andrew Callahan:[id number] DOB: 13-Jun-1934 DOA: 11/18/2021  PCP: Rusty Aus, MD  Admit date: 11/18/2021 Discharge date: 11/19/2021  Admitted From: home Disposition:  home  Recommendations for Outpatient Follow-up:  Follow up with PCP in 1 week Please obtain BMP/CBC in one week Please follow up with cardiology in 1 week for Holter monitor and possible ischemic work-up    Discharge Condition:Stable CODE STATUS: Full Diet recommendation: Heart Healthy / Carb Modified  Brief/Interim Summary: Per [name] is a 87 y.o. male with medical history significant for Diastolic CHF, paroxysmal A. fib on Eliquis, hypothyroidism, essential hypertension, diabetes and adrenal insufficiency who presents to the ED with sudden onset chest pain described as retrosternal tightness of severe intensity radiating down bilateral arms associated with palpitations.  Denies shortness of breath, cough, fever or chills.  Denies nausea, vomiting, diaphoresis.  Denies lower extremity pain or swelling.  Patient was found to be in rapid A. fib by EMS but converted to sinus by arrival with rate in the 80s.  Cardiology was consulted.  Elevated troponin  per cardiology this is likely demand ischemia Was consulted Echo with normal EF and normal wall motion  Will need to follow-up with his primary cardiologist for possible ischemic work-up to be done as outpatient Initially patient was treated with heparin drip.  And converted to his home Eliquis dose    Paroxysmal atrial fibrillation with rapid ventricular response  On chronic anticoagulation - Patient currently in sinus bradycardia  Was treated with heparin drip as above then transition to his home dose Eliquis. He may have tachybradycardia syndrome and will need to be evaluated with outpatient Holter monitor, will defer to his primary cardiologist Unable to place him on any AV nodal blockers due to bradycardia       Acquired hypothyroidism Continue  levothyroxine     Benign essential hypertension Continue home meds     Type 2 diabetes mellitus without complication, without continue home meds     Secondary adrenal insufficiency (HCC) - Continue hydrocortisone     Chronic diastolic CHF (congestive heart failure), NYHA class 3 ([place]) Without acute exacerbation        Discharge Diagnoses:  Principal Problem:   NSTEMI (non-ST elevated myocardial infarction) ([place]) Active Problems:   Acquired hypothyroidism   Anticoagulant long-term use   Benign essential hypertension   Paroxysmal atrial fibrillation with rapid ventricular response (HCC)   Type 2 diabetes mellitus without complication, without long-term current use of insulin (HCC)   Secondary adrenal insufficiency (HCC)   Chronic diastolic CHF (congestive heart failure), NYHA class 3 ([place])    Discharge Instructions  Discharge Instructions     Call MD for:  severe uncontrolled pain   Complete by: As directed    Diet - low sodium heart healthy   Complete by: As directed    Increase activity slowly   Complete by: As directed       Allergies as of 11/19/2021       Reactions   Metoprolol Other (See Comments)   dizziness   Rivaroxaban Other (See Comments)   hematuria        Medication List     TAKE these medications    abiraterone acetate 250 MG tablet Commonly known as: ZYTIGA TAKE 3 TABLETS (750 MG TOTAL) BY MOUTH DAILY. TAKE ON AN EMPTY STOMACH 1 HOUR BEFORE OR 2 HOURS AFTER A MEAL   Accu-Chek Guide test strip Generic drug: glucose blood daily. use as directed   Accu-Chek Guide w/Device Kit  See admin instructions.   Accu-Chek Softclix Lancets lancets daily.   acyclovir ointment 5 % Commonly known as: Zovirax Apply 1 application topically every 3 (three) hours. Apply to aa groin prn flares q 3 hours until resolved   Align 4 MG Caps Take 4 mg by mouth daily.   aspirin 81 MG EC tablet Take 1 tablet (81 mg total) by mouth daily. Swallow  whole. Start taking on: November 20, 2021   CALCIUM 1200+D3 PO Take 2 tablets by mouth in the morning and at bedtime.   cetirizine 10 MG tablet Commonly known as: ZYRTEC Take 10 mg by mouth daily as needed for allergies.   ELIGARD [place] Inject into the skin. 1 injection every 6 months.   Eliquis 5 MG Tabs tablet Generic drug: apixaban Take 5 mg by mouth 2 (two) times daily.   glipiZIDE 10 MG 24 hr tablet Commonly known as: GLUCOTROL XL Take 10 mg by mouth daily.   hydrocortisone 10 MG tablet Commonly known as: CORTEF Take 10 mg by mouth 2 (two) times daily.   Iron (Ferrous Sulfate) 325 (65 Fe) MG Tabs daily.   levothyroxine 50 MCG tablet Commonly known as: SYNTHROID Take 50 mcg by mouth daily before breakfast.   melatonin 5 MG Tabs Take 10 mg by mouth at bedtime.   multivitamin with minerals Tabs tablet Take 1 tablet by mouth daily. One-A-Day Men's 50+   OSTEO BI-FLEX ADV JOINT SHIELD PO Take 1 tablet by mouth daily.   polyethylene glycol 17 g packet Commonly known as: MIRALAX / GLYCOLAX Take 17 g by mouth daily. W/coffee   SUPER B COMPLEX PO Take 1 capsule by mouth daily.   triamcinolone cream 0.1 % Commonly known as: KENALOG Apply 1 application topically as directed. Qd to bid up to 5 days per week to right hand until clear, then prn flares, avoid face, groin, axilla   Trospium Chloride 60 MG Cp24 Take 1 capsule (60 mg total) by mouth daily.   valsartan 320 MG tablet Commonly known as: DIOVAN Take 160 mg by mouth 2 (two) times daily.   Xgeva 120 MG/1.7ML Soln injection Generic drug: denosumab Inject 120 mg into the skin every 3 (three) months.        Follow-up Information     Andrez Grime, MD Follow up in 1 week(s).   Specialty: Cardiology Why: needs stress test and holter. hospital f/u Contact information: [place] [address] [phone]         Rusty Aus, MD Follow up in 1 week(s).   Specialty:  Internal Medicine Contact information: [place] [address] [phone]                Allergies  Allergen Reactions   Metoprolol Other (See Comments)    dizziness    Rivaroxaban Other (See Comments)    hematuria     Consultations: Cardiology   Procedures/Studies: DG Chest Portable 1 View  Result Date: 11/18/2021 CLINICAL DATA:  Shortness of breath and chest pain, initial encounter EXAM: PORTABLE CHEST 1 VIEW COMPARISON:  None. FINDINGS: The heart size and mediastinal contours are within normal limits. Both lungs are clear. The visualized skeletal structures are unremarkable. Postsurgical changes in the cervical spine are noted. IMPRESSION: No active disease. Electronically Signed   By: Inez Catalina M.D.   On: 11/18/2021 00:45   ECHOCARDIOGRAM COMPLETE  Result Date: 11/19/2021    ECHOCARDIOGRAM REPORT   Patient Name:  Andrew Callahan Date of Exam: 11/19/2021 Medical Rec #:  [id number]    Height:       70.0 in Accession #:    [id number]   Weight:       225.0 lb Date of Birth:  Mar 25, 1934   BSA:          2.194 m Patient Age:    53 years     BP:           177/81 mmHg Patient Gender: M            HR:           54 bpm. Exam Location:  [facility] Procedure: 2D Echo, Cardiac Doppler and Color Doppler Indications:     NSTEMI I21.4  History:         Patient has no prior history of Echocardiogram examinations.                  Arrythmias:Atrial Fibrillation; Risk Factors:Hypertension.  Sonographer:     Alyse Low Roar Referring Phys:  [id number] [place] Diagnosing Phys: Isaias Cowman MD IMPRESSIONS  1. Left ventricular ejection fraction, by estimation, is 60 to 65%. The left ventricle has normal function. The left ventricle has no regional wall motion abnormalities. Left ventricular diastolic parameters were normal.  2. Right ventricular systolic function is normal. The right ventricular size is normal.  3. The mitral valve is normal in  structure. Trivial mitral valve regurgitation. No evidence of mitral stenosis.  4. The aortic valve is normal in structure. Aortic valve regurgitation is not visualized. No aortic stenosis is present.  5. The inferior vena cava is normal in size with greater than 50% respiratory variability, suggesting right atrial pressure of 3 mmHg. FINDINGS  Left Ventricle: Left ventricular ejection fraction, by estimation, is 60 to 65%. The left ventricle has normal function. The left ventricle has no regional wall motion abnormalities. The left ventricular internal cavity size was normal in size. There is  borderline left ventricular hypertrophy. Left ventricular diastolic parameters were normal. Right Ventricle: The right ventricular size is normal. No increase in right ventricular wall thickness. Right ventricular systolic function is normal. Left Atrium: Left atrial size was normal in size. Right Atrium: Right atrial size was normal in size. Pericardium: There is no evidence of pericardial effusion. Mitral Valve: The mitral valve is normal in structure. Trivial mitral valve regurgitation. No evidence of mitral valve stenosis. Tricuspid Valve: The tricuspid valve is normal in structure. Tricuspid valve regurgitation is trivial. No evidence of tricuspid stenosis. Aortic Valve: The aortic valve is normal in structure. Aortic valve regurgitation is not visualized. No aortic stenosis is present. Aortic valve peak gradient measures 7.1 mmHg. Pulmonic Valve: The pulmonic valve was normal in structure. Pulmonic valve regurgitation is not visualized. No evidence of pulmonic stenosis. Aorta: The aortic root is normal in size and structure. Venous: The inferior vena cava is normal in size with greater than 50% respiratory variability, suggesting right atrial pressure of 3 mmHg. IAS/Shunts: No atrial level shunt detected by color flow Doppler.  LEFT VENTRICLE PLAX 2D LVIDd:         4.50 cm   Diastology LVIDs:         3.20 cm   LV e'  medial:    6.96 cm/s LV PW:         1.10 cm   LV E/e' medial:  10.5 LV IVS:        1.40 cm   LV e' lateral:  7.72 cm/s LVOT diam:     2.10 cm   LV E/e' lateral: 9.4 LVOT Area:     3.46 cm  RIGHT VENTRICLE RV Basal diam:  3.40 cm RV Mid diam:    3.00 cm RV S prime:     12.00 cm/s TAPSE (M-mode): 2.5 cm LEFT ATRIUM             Index        RIGHT ATRIUM           Index LA diam:        3.90 cm 1.78 cm/m   RA Area:     10.80 cm LA Vol (A2C):   50.0 ml 22.78 ml/m  RA Volume:   20.90 ml  9.52 ml/m LA Vol (A4C):   48.9 ml 22.28 ml/m LA Biplane Vol: 50.3 ml 22.92 ml/m  AORTIC VALVE                 PULMONIC VALVE AV Area (Vmax): 3.20 cm     PV Vmax:          1.56 m/s AV Vmax:        133.00 cm/s  PV Peak grad:     9.7 mmHg AV Peak Grad:   7.1 mmHg     PR End Diast Vel: 2.92 msec LVOT Vmax:      123.00 cm/s  RVOT Peak grad:   1 mmHg  AORTA Ao Root diam: 3.00 cm Ao Asc diam:  3.10 cm MITRAL VALVE               TRICUSPID VALVE MV Area (PHT): 2.79 cm    TR Peak grad:   11.6 mmHg MV Decel Time: 272 msec    TR Vmax:        170.00 cm/s MV E velocity: 72.80 cm/s MV A velocity: 60.80 cm/s  SHUNTS MV E/A ratio:  1.20        Systemic Diam: 2.10 cm MV A Prime:    7.0 cm/s Isaias Cowman MD Electronically signed by Isaias Cowman MD Signature Date/Time: 11/19/2021/3:30:17 PM    Final       Subjective: No chest pain no shortness of breath.  Ambulated without any difficulty.  Discharge Exam: Vitals:   11/19/21 1110 11/19/21 1550  BP: (!) 168/70 (!) 162/67  Pulse: (!) 55 (!) 51  Resp: 18 18  Temp: 98 F (36.7 C) 98.6 F (37 C)  SpO2: 98% 100%   Vitals:   11/19/21 0432 11/19/21 0818 11/19/21 1110 11/19/21 1550  BP: (!) 197/78 (!) 177/81 (!) 168/70 (!) 162/67  Pulse: (!) 53 (!) 54 (!) 55 (!) 51  Resp: _0 Temp: 98.3 F (36.8 C) 98.1 F (36.7 C) 98 F (36.7 C) 98.6 F (37 C)  TempSrc:  Oral Oral Oral  SpO2: 99% 99% 98% 100%    General: Pt is alert, awake, not in acute  distress Cardiovascular: RRR, S1/S2 +, no rubs, no gallops Respiratory: CTA bilaterally, no wheezing, no rhonchi Abdominal: Soft, NT, ND, bowel sounds + Extremities: no edema, no cyanosis    The results of significant diagnostics from this hospitalization (including imaging, microbiology, ancillary and laboratory) are listed below for reference.     Microbiology: Recent Results (from the past 240 hour(s))  Resp Panel by RT-PCR (Flu A&B, Covid) Nasopharyngeal Swab     Status: None   Collection Time: 11/18/21  3:42 AM   Specimen: Nasopharyngeal Swab; Nasopharyngeal(NP) swabs in vial transport medium  Result Value Ref Range Status   SARS Coronavirus 2 by RT PCR NEGATIVE NEGATIVE Final    Comment: (NOTE) SARS-CoV-2 target nucleic acids are NOT DETECTED.  The SARS-CoV-2 RNA is generally detectable in upper respiratory specimens during the acute phase of infection. The lowest concentration of SARS-CoV-2 viral copies this assay can detect is 138 copies/mL. A negative result does not preclude SARS-Cov-2 infection and should not be used as the sole basis for treatment or other patient management decisions. A negative result may occur with  improper specimen collection/handling, submission of specimen other than nasopharyngeal swab, presence of viral mutation(s) within the areas targeted by this assay, and inadequate number of viral copies(<138 copies/mL). A negative result must be combined with clinical observations, patient history, and epidemiological information. The expected result is Negative.  Fact Sheet for Patients:  EntrepreneurPulse.com.au  Fact Sheet for Healthcare Providers:  IncredibleEmployment.be  This test is no t yet approved or cleared by the [place] FDA and  has been authorized for detection and/or diagnosis of SARS-CoV-2 by FDA under an Emergency Use Authorization (EUA). This EUA will remain  in effect (meaning this test  can be used) for the duration of the COVID-19 declaration under Section 564(b)(1) of the Act, 21 U.S.C.section 360bbb-3(b)(1), unless the authorization is terminated  or revoked sooner.       Influenza A by PCR NEGATIVE NEGATIVE Final   Influenza B by PCR NEGATIVE NEGATIVE Final    Comment: (NOTE) The Xpert Xpress SARS-CoV-2/FLU/RSV plus assay is intended as an aid in the diagnosis of influenza from Nasopharyngeal swab specimens and should not be used as a sole basis for treatment. Nasal washings and aspirates are unacceptable for Xpert Xpress SARS-CoV-2/FLU/RSV testing.  Fact Sheet for Patients: EntrepreneurPulse.com.au  Fact Sheet for Healthcare Providers: IncredibleEmployment.be  This test is not yet approved or cleared by the [place] FDA and has been authorized for detection and/or diagnosis of SARS-CoV-2 by FDA under an Emergency Use Authorization (EUA). This EUA will remain in effect (meaning this test can be used) for the duration of the COVID-19 declaration under Section 564(b)(1) of the Act, 21 U.S.C. section 360bbb-3(b)(1), unless the authorization is terminated or revoked.  Performed at [facility], [address] [place], [place] [address]      Labs: BNP (last 3 results) No results for input(s): BNP in the last 8760 hours. Basic Metabolic Panel: Recent Labs  Lab 11/18/21 0018  NA 139  K 3.6  CL 105  CO2 24  GLUCOSE 189*  BUN 22  CREATININE 1.12  CALCIUM 9.2  MG 2.4   Liver Function Tests: No results for input(s): AST, ALT, ALKPHOS, BILITOT, PROT, ALBUMIN in the last 168 hours. No results for input(s): LIPASE, AMYLASE in the last 168 hours. No results for input(s): AMMONIA in the last 168 hours. CBC: Recent Labs  Lab 11/18/21 0018 11/19/21 0544  WBC 11.1* 8.1  NEUTROABS 6.5  --   HGB 13.9 12.6*  HCT 41.9 38.1*  MCV 88.2 88.0  PLT 203 170   Cardiac Enzymes: No results for input(s):  CKTOTAL, CKMB, CKMBINDEX, TROPONINI in the last 168 hours. BNP: Invalid input(s): POCBNP CBG: Recent Labs  Lab 11/18/21 1619 11/18/21 2116 11/19/21 0829 11/19/21 1116 11/19/21 1534  GLUCAP 88 119* 83 128* 117*   D-Dimer No results for input(s): DDIMER in the last 72 hours. Hgb A1c Recent Labs    11/18/21 0407  HGBA1C 5.9*   Lipid Profile No results for input(s): CHOL, HDL, LDLCALC,  TRIG, CHOLHDL, LDLDIRECT in the last 72 hours. Thyroid function studies Recent Labs    11/18/21 0018  TSH 4.046   Anemia work up No results for input(s): VITAMINB12, FOLATE, FERRITIN, TIBC, IRON, RETICCTPCT in the last 72 hours. Urinalysis    Component Value Date/Time   COLORURINE STRAW (A) 01/31/2020 0505   APPEARANCEUR Hazy (A) 02/16/2021 0943   LABSPEC 1.010 01/31/2020 0505   PHURINE 6.0 01/31/2020 0505   GLUCOSEU Negative 02/16/2021 0943   HGBUR NEGATIVE 01/31/2020 0505   BILIRUBINUR Negative 02/16/2021 0943   KETONESUR NEGATIVE 01/31/2020 0505   PROTEINUR Negative 02/16/2021 0943   PROTEINUR NEGATIVE 01/31/2020 0505   NITRITE Positive (A) 02/16/2021 0943   NITRITE NEGATIVE 01/31/2020 0505   LEUKOCYTESUR 1+ (A) 02/16/2021 0943   LEUKOCYTESUR NEGATIVE 01/31/2020 0505   Sepsis Labs Invalid input(s): PROCALCITONIN,  WBC,  LACTICIDVEN Microbiology Recent Results (from the past 240 hour(s))  Resp Panel by RT-PCR (Flu A&B, Covid) Nasopharyngeal Swab     Status: None   Collection Time: 11/18/21  3:42 AM   Specimen: Nasopharyngeal Swab; Nasopharyngeal(NP) swabs in vial transport medium  Result Value Ref Range Status   SARS Coronavirus 2 by RT PCR NEGATIVE NEGATIVE Final    Comment: (NOTE) SARS-CoV-2 target nucleic acids are NOT DETECTED.  The SARS-CoV-2 RNA is generally detectable in upper respiratory specimens during the acute phase of infection. The lowest concentration of SARS-CoV-2 viral copies this assay can detect is 138 copies/mL. A negative result does not preclude  SARS-Cov-2 infection and should not be used as the sole basis for treatment or other patient management decisions. A negative result may occur with  improper specimen collection/handling, submission of specimen other than nasopharyngeal swab, presence of viral mutation(s) within the areas targeted by this assay, and inadequate number of viral copies(<138 copies/mL). A negative result must be combined with clinical observations, patient history, and epidemiological information. The expected result is Negative.  Fact Sheet for Patients:  EntrepreneurPulse.com.au  Fact Sheet for Healthcare Providers:  IncredibleEmployment.be  This test is no t yet approved or cleared by the [place] FDA and  has been authorized for detection and/or diagnosis of SARS-CoV-2 by FDA under an Emergency Use Authorization (EUA). This EUA will remain  in effect (meaning this test can be used) for the duration of the COVID-19 declaration under Section 564(b)(1) of the Act, 21 U.S.C.section 360bbb-3(b)(1), unless the authorization is terminated  or revoked sooner.       Influenza A by PCR NEGATIVE NEGATIVE Final   Influenza B by PCR NEGATIVE NEGATIVE Final    Comment: (NOTE) The Xpert Xpress SARS-CoV-2/FLU/RSV plus assay is intended as an aid in the diagnosis of influenza from Nasopharyngeal swab specimens and should not be used as a sole basis for treatment. Nasal washings and aspirates are unacceptable for Xpert Xpress SARS-CoV-2/FLU/RSV testing.  Fact Sheet for Patients: EntrepreneurPulse.com.au  Fact Sheet for Healthcare Providers: IncredibleEmployment.be  This test is not yet approved or cleared by the [place] FDA and has been authorized for detection and/or diagnosis of SARS-CoV-2 by FDA under an Emergency Use Authorization (EUA). This EUA will remain in effect (meaning this test can be used) for the duration of  the COVID-19 declaration under Section 564(b)(1) of the Act, 21 U.S.C. section 360bbb-3(b)(1), unless the authorization is terminated or revoked.  Performed at [facility], [address], [place], [place] [address]      Time coordinating discharge: Over 30 minutes  SIGNED:   Nolberto Hanlon, MD  [facility] 11/19/2021, 4:01 PM Pager   If 7PM-7AM, please contact night-coverage [email or web address] Password [email or web address]

## 2021-11-19 NOTE — Progress Notes (Signed)
ANTICOAGULATION CONSULT NOTE - Initial Consult  Pharmacy Consult for Heparin  Indication: chest pain/ACS  Allergies  Allergen Reactions   Metoprolol Other (See Comments)    dizziness    Rivaroxaban Other (See Comments)    hematuria     Patient Measurements:   Heparin Dosing Weight: 94.5 kg   Vital Signs: Temp: 98.3 F (36.8 C) (12/10 0432) Temp Source: Oral (12/09 2023) BP: 197/78 (12/10 0432) Pulse Rate: 53 (12/10 0432)  Labs: Recent Labs    11/18/21 0018 11/18/21 0219 11/18/21 0407 11/18/21 0407 11/18/21 0903 11/18/21 1151 11/18/21 1314 11/18/21 2043 11/19/21 0544  HGB 13.9  --   --   --   --   --   --   --  12.6*  HCT 41.9  --   --   --   --   --   --   --  38.1*  PLT 203  --   --   --   --   --   --   --  170  APTT  --   --  38*   < >  --   --  73* 78* 103*  LABPROT  --   --  13.2  --   --   --   --   --   --   INR  --   --  1.0  --   --   --   --   --   --   HEPARINUNFRC  --   --  >1.10*  --   --   --   --   --  >1.10*  CREATININE 1.12  --   --   --   --   --   --   --   --   TROPONINIHS 11 75*  --   --  188* 160*  --   --   --    < > = values in this interval not displayed.     Estimated Creatinine Clearance: 58.8 mL/min (by C-G formula based on SCr of 1.12 mg/dL).   Medical History: Past Medical History:  Diagnosis Date   Acquired hypothyroidism    Aortic atherosclerosis (HCC)    Atrial fibrillation (HCC)    Chronic constipation    CVA (cerebral vascular accident) ([place])    DDD (degenerative disc disease), cervical    Diabetes mellitus without complication ([place])    Diverticulosis    Elevated prostate specific antigen (PSA)    Family history of leukemia    Family history of lung cancer    History of kidney stones    History of pituitary adenoma    Hyperlipidemia    Hypertension    Hypogonadism in male    Hypothyroid    Hypothyroidism    IDA (iron deficiency anemia)    Lumbar disc disease    Nephrolithiasis    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 Mohs.   Stroke ([place])    throat / slight difficulty swallowing    Medications:  Medications Prior to Admission  Medication Sig Dispense Refill Last Dose   abiraterone acetate (ZYTIGA) 250 MG tablet TAKE 3 TABLETS (750 MG TOTAL) BY MOUTH DAILY. TAKE ON AN EMPTY STOMACH 1 HOUR BEFORE OR 2 HOURS AFTER A  MEAL 90 tablet 0 11/17/2021 at 1630   acyclovir ointment (ZOVIRAX) 5 % Apply 1 application topically every 3 (three) hours. Apply to aa groin prn flares q 3 hours until resolved 30 g 11 11/17/2021   B Complex-C (SUPER B COMPLEX PO) Take 1 capsule by mouth daily.    11/17/2021 at 0800   Calcium-Magnesium-Vitamin D (CALCIUM 1200+D3 PO) Take 2 tablets by mouth in the morning and at bedtime.   11/17/2021 at 1830   cetirizine (ZYRTEC) 10 MG tablet Take 10 mg by mouth daily as needed for allergies.   prn at prn   denosumab (XGEVA) 120 MG/1.7ML SOLN injection Inject 120 mg into the skin every 3 (three) months.   09/01/2021 at unknown   ELIQUIS 5 MG TABS tablet Take 5 mg by mouth 2 (two) times daily.   11/17/2021 at 2200   glipiZIDE (GLUCOTROL XL) 10 MG 24 hr tablet Take 10 mg by mouth daily.   11/17/2021 at 0800   hydrocortisone (CORTEF) 10 MG tablet Take 10 mg by mouth 2 (two) times daily.    11/17/2021 at 2200   Iron, Ferrous Sulfate, 325 (65 Fe) MG TABS daily.   11/17/2021 at 1830   Leuprolide Acetate (ELIGARD [place]) Inject into the skin. 1 injection every 6 months.   unknown at unknown   levothyroxine (SYNTHROID) 50 MCG tablet Take 50 mcg by mouth daily before breakfast.   11/17/2021 at 0630   melatonin 5 MG TABS Take 10 mg by mouth at bedtime.   11/17/2021 at 2200   Misc Natural Products (OSTEO BI-FLEX ADV JOINT SHIELD PO) Take 1 tablet by mouth daily.    11/17/2021 at 1830   Multiple Vitamin  (MULTIVITAMIN WITH MINERALS) TABS tablet Take 1 tablet by mouth daily. One-A-Day Men's 50+   11/17/2021 at 1830   polyethylene glycol (MIRALAX / GLYCOLAX) 17 g packet Take 17 g by mouth daily. W/coffee   11/17/2021 at 0800   Probiotic Product (ALIGN) 4 MG CAPS Take 4 mg by mouth daily.    11/17/2021 at 2200   triamcinolone cream (KENALOG) 0.1 % Apply 1 application topically as directed. Qd to bid up to 5 days per week to right hand until clear, then prn flares, avoid face, groin, axilla 80 g 1 prn at prn   valsartan (DIOVAN) 320 MG tablet Take 160 mg by mouth 2 (two) times daily.   11/17/2021 at [place] test strip daily. use as directed      Accu-Chek Softclix Lancets lancets daily.      Blood Glucose Monitoring Suppl (ACCU-CHEK GUIDE) w/Device KIT See admin instructions.      Trospium Chloride 60 MG CP24 Take 1 capsule (60 mg total) by mouth daily. (Patient not taking: Reported on 11/18/2021) 30 capsule 11 Not Taking    Assessment: Pharmacy consulted to dose heparin in this 87 yr old male admitted with ACS/NSTEMI.  Pt was on Eliquis 5 mg PO BID PTA, unsure of last dose.  CrCl = 58.8 ml/min   Date Time HL/aPTT Rate/Comment  12/9 1314 aPTT = 73s Therapeutic x 1 @ 1300 units/hr 12/9 2043 aPTT = 78s Therapeutic x 2  12/10   0544   aPTT = 103s / HL = > 1.10 ,  Elevated   Goal of Therapy:  Heparin level 0.3-0.7 units/ml aPTT 66 - 102  seconds Monitor platelets by anticoagulation protocol: Yes   Plan:  12/10 @ 0544:  aPTT = 103s / HL = > 1.10 aPTT is  SUPRAtherapeutic , HL still elevated from Eliquis.  Will decrease heparin drip to 1200 units/hr.  Will recheck aPTT 8 hrs after rate change. Will recheck HL on 12/11 with AM labs.  Will check CBC daily   Leva Baine D, PharmD Clinical Pharmacist   11/19/2021,6:52 AM

## 2021-11-27 ENCOUNTER — Encounter: Payer: Self-pay | Admitting: Oncology

## 2021-12-01 ENCOUNTER — Inpatient Hospital Stay (HOSPITAL_BASED_OUTPATIENT_CLINIC_OR_DEPARTMENT_OTHER): Payer: Medicare Other | Admitting: Oncology

## 2021-12-01 ENCOUNTER — Encounter: Payer: Self-pay | Admitting: Oncology

## 2021-12-01 ENCOUNTER — Inpatient Hospital Stay: Payer: Medicare Other | Attending: Oncology

## 2021-12-01 ENCOUNTER — Other Ambulatory Visit: Payer: Self-pay

## 2021-12-01 ENCOUNTER — Inpatient Hospital Stay: Payer: Medicare Other

## 2021-12-01 VITALS — BP 134/67 | HR 50 | Temp 98.7°F | Ht 70.0 in | Wt 236.8 lb

## 2021-12-01 DIAGNOSIS — Z7982 Long term (current) use of aspirin: Secondary | ICD-10-CM | POA: Diagnosis not present

## 2021-12-01 DIAGNOSIS — D509 Iron deficiency anemia, unspecified: Secondary | ICD-10-CM

## 2021-12-01 DIAGNOSIS — E119 Type 2 diabetes mellitus without complications: Secondary | ICD-10-CM | POA: Insufficient documentation

## 2021-12-01 DIAGNOSIS — G4700 Insomnia, unspecified: Secondary | ICD-10-CM | POA: Insufficient documentation

## 2021-12-01 DIAGNOSIS — Z79818 Long term (current) use of other agents affecting estrogen receptors and estrogen levels: Secondary | ICD-10-CM | POA: Insufficient documentation

## 2021-12-01 DIAGNOSIS — Z8673 Personal history of transient ischemic attack (TIA), and cerebral infarction without residual deficits: Secondary | ICD-10-CM | POA: Diagnosis not present

## 2021-12-01 DIAGNOSIS — Z801 Family history of malignant neoplasm of trachea, bronchus and lung: Secondary | ICD-10-CM | POA: Insufficient documentation

## 2021-12-01 DIAGNOSIS — Z7984 Long term (current) use of oral hypoglycemic drugs: Secondary | ICD-10-CM | POA: Insufficient documentation

## 2021-12-01 DIAGNOSIS — Z7901 Long term (current) use of anticoagulants: Secondary | ICD-10-CM | POA: Diagnosis not present

## 2021-12-01 DIAGNOSIS — Z806 Family history of leukemia: Secondary | ICD-10-CM | POA: Insufficient documentation

## 2021-12-01 DIAGNOSIS — C7951 Secondary malignant neoplasm of bone: Secondary | ICD-10-CM | POA: Insufficient documentation

## 2021-12-01 DIAGNOSIS — Z79899 Other long term (current) drug therapy: Secondary | ICD-10-CM | POA: Insufficient documentation

## 2021-12-01 DIAGNOSIS — C61 Malignant neoplasm of prostate: Secondary | ICD-10-CM | POA: Diagnosis not present

## 2021-12-01 DIAGNOSIS — I10 Essential (primary) hypertension: Secondary | ICD-10-CM | POA: Insufficient documentation

## 2021-12-01 DIAGNOSIS — I4891 Unspecified atrial fibrillation: Secondary | ICD-10-CM | POA: Diagnosis not present

## 2021-12-01 DIAGNOSIS — Z87891 Personal history of nicotine dependence: Secondary | ICD-10-CM | POA: Insufficient documentation

## 2021-12-01 DIAGNOSIS — E039 Hypothyroidism, unspecified: Secondary | ICD-10-CM | POA: Diagnosis not present

## 2021-12-01 LAB — CBC WITH DIFFERENTIAL/PLATELET
Abs Immature Granulocytes: 0.06 10*3/uL (ref 0.00–0.07)
Basophils Absolute: 0 10*3/uL (ref 0.0–0.1)
Basophils Relative: 0 %
Eosinophils Absolute: 0.1 10*3/uL (ref 0.0–0.5)
Eosinophils Relative: 2 %
HCT: 36.7 % — ABNORMAL LOW (ref 39.0–52.0)
Hemoglobin: 12 g/dL — ABNORMAL LOW (ref 13.0–17.0)
Immature Granulocytes: 1 %
Lymphocytes Relative: 21 %
Lymphs Abs: 1.5 10*3/uL (ref 0.7–4.0)
MCH: 29.1 pg (ref 26.0–34.0)
MCHC: 32.7 g/dL (ref 30.0–36.0)
MCV: 88.9 fL (ref 80.0–100.0)
Monocytes Absolute: 0.6 10*3/uL (ref 0.1–1.0)
Monocytes Relative: 8 %
Neutro Abs: 5.1 10*3/uL (ref 1.7–7.7)
Neutrophils Relative %: 68 %
Platelets: 220 10*3/uL (ref 150–400)
RBC: 4.13 MIL/uL — ABNORMAL LOW (ref 4.22–5.81)
RDW: 15.6 % — ABNORMAL HIGH (ref 11.5–15.5)
WBC: 7.4 10*3/uL (ref 4.0–10.5)
nRBC: 0 % (ref 0.0–0.2)

## 2021-12-01 LAB — FERRITIN: Ferritin: 17 ng/mL — ABNORMAL LOW (ref 24–336)

## 2021-12-01 LAB — COMPREHENSIVE METABOLIC PANEL
ALT: 15 U/L (ref 0–44)
AST: 26 U/L (ref 15–41)
Albumin: 3.8 g/dL (ref 3.5–5.0)
Alkaline Phosphatase: 57 U/L (ref 38–126)
Anion gap: 11 (ref 5–15)
BUN: 17 mg/dL (ref 8–23)
CO2: 21 mmol/L — ABNORMAL LOW (ref 22–32)
Calcium: 8.5 mg/dL — ABNORMAL LOW (ref 8.9–10.3)
Chloride: 107 mmol/L (ref 98–111)
Creatinine, Ser: 0.99 mg/dL (ref 0.61–1.24)
GFR, Estimated: >60 mL/min (ref >60)
Glucose, Bld: 108 mg/dL — ABNORMAL HIGH (ref 70–99)
Potassium: 3.8 mmol/L (ref 3.5–5.1)
Sodium: 139 mmol/L (ref 135–145)
Total Bilirubin: 0.1 mg/dL — ABNORMAL LOW (ref 0.3–1.2)
Total Protein: 6.6 g/dL (ref 6.5–8.1)

## 2021-12-01 LAB — IRON AND TIBC
Iron: 64 ug/dL (ref 45–182)
Saturation Ratios: 18 % (ref 17.9–39.5)
TIBC: 347 ug/dL (ref 250–450)
UIBC: 283 ug/dL

## 2021-12-01 LAB — PSA: Prostatic Specific Antigen: 0.01 ng/mL (ref 0.00–4.00)

## 2021-12-01 MED ORDER — DENOSUMAB 120 MG/1.7ML ~~LOC~~ SOLN
120.0000 mg | Freq: Once | SUBCUTANEOUS | Status: AC
  Administered 2021-12-01: 11:00:00 120 mg via SUBCUTANEOUS
  Filled 2021-12-01: qty 1.7

## 2021-12-01 MED ORDER — LEUPROLIDE ACETATE (6 MONTH) 45 MG ~~LOC~~ KIT
45.0000 mg | PACK | Freq: Once | SUBCUTANEOUS | Status: AC
  Administered 2021-12-01: 11:00:00 45 mg via SUBCUTANEOUS
  Filled 2021-12-01: qty 45

## 2021-12-01 NOTE — Progress Notes (Signed)
Ca 8.5 ok to treat with xgeva today

## 2021-12-01 NOTE — Progress Notes (Signed)
Pt concerned that if he gets his injection in his stomach like last time, it will bleed and will have to go to the ed like last tx. Also, having constipation, did go to the ed for this as well. He would like to know if his psa is stable can he decrease his rx for this? Wife states she looked up the calcium and one of the effects is coronary artery disease, since he had a heart attack, should he stop this rx?

## 2021-12-01 NOTE — Progress Notes (Signed)
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  INTERVAL HISTORY: Andrew Callahan is an 87 year old male with multiple medical complaints who is followed by Dr. Grayland Ormond for stage IV prostate cancer with widespread bony metastasis.  He currently receives Eligard every 6 months, Xgeva every 3 months and is on Zytiga dose reduced daily.  Reports recently being hospitalized for A. fib with RVR which converted to normal sinus rhythm prior to arrival to the emergency room.  He had elevated troponin which was likely demand ischemia and had echo with normal EF and wall motion.  He is stable on Eliquis.  He was unable to be placed on AV nodal blockers due to bradycardia.  He has outpatient follow-up stress test next week.  Reports overall feeling well since hospitalization.  No additional episodes.  Wife reports he was started on 81 mg aspirin.  She questions given undetectable PSA if he can be taken off more medications.  He continues to have insomnia due to overactive bladder.  He has been seen by urology and had 12 treatments to try and improve this but have been unsuccessful.  He reports some easy bruising but denies any bleeding.  He has not been very active but his pulmonologist has set him up to start participating in a group exercise class that begins next week.   REVIEW OF SYSTEMS:   Review of Systems  Constitutional:  Positive for malaise/fatigue. Negative for chills, fever and weight loss.  HENT:  Negative for congestion, ear pain and tinnitus.   Eyes: Negative.  Negative for blurred vision and double vision.  Respiratory: Negative.  Negative for cough, sputum production and  shortness of breath.   Cardiovascular: Negative.  Negative for chest pain, palpitations and leg swelling.  Gastrointestinal: Negative.  Negative for abdominal pain, constipation, diarrhea, nausea and vomiting.  Genitourinary:  Positive for frequency and urgency. Negative for dysuria.  Musculoskeletal:  Positive for joint pain. Negative for back pain and falls.  Skin: Negative.  Negative for rash.  Neurological:  Positive for weakness. Negative for headaches.  Endo/Heme/Allergies: Negative.  Does not bruise/bleed easily.  Psychiatric/Behavioral:  Negative for depression. The patient has insomnia. The patient is not nervous/anxious.    As per HPI. Otherwise, a complete review of systems is negative.  PAST MEDICAL HISTORY: Past Medical History:  Diagnosis Date   Acquired hypothyroidism    Aortic atherosclerosis (HCC)    Atrial fibrillation (HCC)    Chronic constipation    CVA (cerebral vascular accident) ([place])    DDD (degenerative disc disease), cervical    Diabetes mellitus without complication ([place])    Diverticulosis    Elevated prostate specific antigen (PSA)    Family history of leukemia    Family history of lung cancer    History of kidney stones    History of pituitary adenoma    Hyperlipidemia    Hypertension    Hypogonadism in male    Hypothyroid    Hypothyroidism    IDA (iron deficiency anemia)    Lumbar disc disease    Nephrolithiasis    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 Mohs.   Stroke ([place])    throat / slight difficulty swallowing    PAST SURGICAL HISTORY: Past Surgical History:  Procedure Laterality Date   BACK SURGERY  1997   L3, 4, 5 laminectomy    BACK SURGERY  1998   hard wire removed, fusion of L3-5   BACK SURGERY  2009   laminotomy/foraminotomy w/  decompression of nerve root, facet thermal ablation L2-3.    COLONOSCOPY     COLONOSCOPY WITH PROPOFOL N/A 02/04/2021   Procedure: COLONOSCOPY WITH PROPOFOL;  Surgeon: Lesly Rubenstein, MD;  Location: [facility] ENDOSCOPY;  Service: Endoscopy;  Laterality: N/A;  STAT CBC BEFORE PROCEDURE   ESOPHAGOGASTRODUODENOSCOPY (EGD) WITH PROPOFOL N/A 02/04/2021   Procedure: ESOPHAGOGASTRODUODENOSCOPY (EGD) WITH PROPOFOL;  Surgeon: Lesly Rubenstein, MD;  Location: [facility] ENDOSCOPY;  Service: Endoscopy;  Laterality: N/A;   HERNIA REPAIR     HOLEP-LASER ENUCLEATION OF THE PROSTATE WITH MORCELLATION N/A 06/18/2020   Procedure: HOLEP-LASER ENUCLEATION OF THE PROSTATE WITH MORCELLATION;  Surgeon: Billey Co, MD;  Location: [facility] ORS;  Service: Urology;  Laterality: N/A;   JOINT REPLACEMENT     left knee partial   KIDNEY STONE SURGERY  2010   KNEE SURGERY Left 2007   Arthroscopic partial knee replacement, replaced medial side    MOHS SURGERY Right 2014   squamous cell carcinoma   NECK SURGERY  2008   fusion with hardwire in place C3 through C7    pituitary adenoma  1994   benign   PROSTATE BIOPSY N/A 01/01/2020   Procedure: PROSTATE BIOPSY URO NAV FUSION;  Surgeon: Royston Cowper, MD;  Location: [facility] ORS;  Service: Urology;  Laterality: N/A;   [place] SURGERY  2016   fusion L4    TONSILLECTOMY      FAMILY HISTORY: Family History  Problem Relation Age of Onset   Stroke Mother    Heart attack Father    Leukemia Sister 64   Lung cancer Brother 47    ADVANCED DIRECTIVES (Y/N):  N  HEALTH MAINTENANCE: Social History   Tobacco Use   Smoking status: Former   Smokeless tobacco: Never   Tobacco comments:    quit 50 years ago  Vaping Use   Vaping Use: Never used  Substance Use Topics   Alcohol use: Yes    Comment: rarely   Drug use: Never     Colonoscopy:  PAP:  Bone density:  Lipid panel:  Allergies  Allergen Reactions   Metoprolol Other (See Comments)     dizziness    Rivaroxaban Other (See Comments)    hematuria     Current Outpatient Medications  Medication Sig Dispense Refill   abiraterone acetate (ZYTIGA) 250 MG tablet TAKE 3 TABLETS (750 MG TOTAL) BY MOUTH DAILY. TAKE ON AN EMPTY STOMACH 1 HOUR BEFORE OR 2 HOURS AFTER A MEAL 90 tablet 0   ACCU-CHEK GUIDE test strip daily. use as directed     Accu-Chek Softclix Lancets lancets daily.     acyclovir ointment (ZOVIRAX) 5 % Apply 1 application topically every 3 (three) hours. Apply to aa groin prn flares q 3 hours until resolved 30 g 11   aspirin EC 81 MG EC tablet Take 1 tablet (81 mg total) by mouth daily. Swallow whole. 30 tablet 0   B Complex-C (SUPER B COMPLEX PO) Take 1 capsule by mouth  daily.      Blood Glucose Monitoring Suppl (ACCU-CHEK GUIDE) w/Device KIT See admin instructions.     Calcium-Magnesium-Vitamin D (CALCIUM 1200+D3 PO) Take 2 tablets by mouth in the morning and at bedtime.     cetirizine (ZYRTEC) 10 MG tablet Take 10 mg by mouth daily as needed for allergies.     denosumab (XGEVA) 120 MG/1.7ML SOLN injection Inject 120 mg into the skin every 3 (three) months.     ELIQUIS 5 MG TABS tablet Take 5 mg by mouth 2 (two) times daily.     glipiZIDE (GLUCOTROL XL) 10 MG 24 hr tablet Take 5 mg by mouth daily.     hydrocortisone (CORTEF) 10 MG tablet Take 10 mg by mouth 2 (two) times daily.      Iron, Ferrous Sulfate, 325 (65 Fe) MG TABS daily.     Leuprolide Acetate (ELIGARD [place]) Inject into the skin. 1 injection every 6 months.     levothyroxine (SYNTHROID) 50 MCG tablet Take 50 mcg by mouth daily before breakfast.     melatonin 5 MG TABS Take 10 mg by mouth at bedtime.     Misc Natural Products (OSTEO BI-FLEX ADV JOINT SHIELD PO) Take 1 tablet by mouth daily.      Multiple Vitamin (MULTIVITAMIN WITH MINERALS) TABS tablet Take 1 tablet by mouth daily. One-A-Day Men's 50+     polyethylene glycol (MIRALAX / GLYCOLAX) 17 g packet Take 17 g by mouth daily. W/coffee     Probiotic  Product (ALIGN) 4 MG CAPS Take 4 mg by mouth daily.      triamcinolone cream (KENALOG) 0.1 % Apply 1 application topically as directed. Qd to bid up to 5 days per week to right hand until clear, then prn flares, avoid face, groin, axilla 80 g 1   valsartan (DIOVAN) 320 MG tablet Take 160 mg by mouth 2 (two) times daily.     Trospium Chloride 60 MG CP24 Take 1 capsule (60 mg total) by mouth daily. (Patient not taking: Reported on 11/18/2021) 30 capsule 11   No current facility-administered medications for this visit.    OBJECTIVE: Vitals:   12/01/21 1009  BP: 134/67  Pulse: (!) 50  Temp: 98.7 F (37.1 C)  SpO2: 99%     Body mass index is 33.98 kg/m.    ECOG FS:1 - Symptomatic but completely ambulatory  Physical Exam Constitutional:      Appearance: Normal appearance. He is obese.  HENT:     Head: Normocephalic and atraumatic.  Eyes:     Pupils: Pupils are equal, round, and reactive to light.  Cardiovascular:     Rate and Rhythm: Normal rate and regular rhythm.     Heart sounds: Normal heart sounds. No murmur heard. Pulmonary:     Effort: Pulmonary effort is normal.     Breath sounds: Normal breath sounds. No wheezing.  Abdominal:     General: Bowel sounds are normal. There is no distension.     Palpations: Abdomen is soft.     Tenderness: There is no abdominal tenderness.  Musculoskeletal:        General: Normal range of motion.     Cervical back: Normal range of motion.  Skin:    General: Skin is warm and dry.     Findings: Bruising present. No rash.     Comments: On arms  Neurological:     Mental Status: He is alert and oriented to person, place, and time.  Psychiatric:  Judgment: Judgment normal.     LAB RESULTS:  Lab Results  Component Value Date   NA 139 12/01/2021   K 3.8 12/01/2021   CL 107 12/01/2021   CO2 21 (L) 12/01/2021   GLUCOSE 108 (H) 12/01/2021   BUN 17 12/01/2021   CREATININE 0.99 12/01/2021   CALCIUM 8.5 (L) 12/01/2021   PROT 6.6  12/01/2021   ALBUMIN 3.8 12/01/2021   AST 26 12/01/2021   ALT 15 12/01/2021   ALKPHOS 57 12/01/2021   BILITOT 0.1 (L) 12/01/2021   GFRNONAA >60 12/01/2021   GFRAA >60 09/01/2020    Lab Results  Component Value Date   WBC 7.4 12/01/2021   NEUTROABS 5.1 12/01/2021   HGB 12.0 (L) 12/01/2021   HCT 36.7 (L) 12/01/2021   MCV 88.9 12/01/2021   PLT 220 12/01/2021     STUDIES: DG Chest Portable 1 View  Result Date: 11/18/2021 CLINICAL DATA:  Shortness of breath and chest pain, initial encounter EXAM: PORTABLE CHEST 1 VIEW COMPARISON:  None. FINDINGS: The heart size and mediastinal contours are within normal limits. Both lungs are clear. The visualized skeletal structures are unremarkable. Postsurgical changes in the cervical spine are noted. IMPRESSION: No active disease. Electronically Signed   By: Inez Catalina M.D.   On: 11/18/2021 00:45   ECHOCARDIOGRAM COMPLETE  Result Date: 11/19/2021    ECHOCARDIOGRAM REPORT   Patient Name:   DANTHONY KENDRIX Date of Exam: 11/19/2021 Medical Rec #:  [id number]    Height:       70.0 in Accession #:    [id number]   Weight:       225.0 lb Date of Birth:  [birth year]   BSA:          2.194 m Patient Age:    78 years     BP:           177/81 mmHg Patient Gender: M            HR:           54 bpm. Exam Location:  [facility] Procedure: 2D Echo, Cardiac Doppler and Color Doppler Indications:     NSTEMI I21.4  History:         Patient has no prior history of Echocardiogram examinations.                  Arrythmias:Atrial Fibrillation; Risk Factors:Hypertension.  Sonographer:     Alyse Low Roar Referring Phys:  [id number] [place] Diagnosing Phys: Isaias Cowman MD IMPRESSIONS  1. Left ventricular ejection fraction, by estimation, is 60 to 65%. The left ventricle has normal function. The left ventricle has no regional wall motion abnormalities. Left ventricular diastolic parameters were normal.  2. Right ventricular systolic function is normal. The right ventricular size is  normal.  3. The mitral valve is normal in structure. Trivial mitral valve regurgitation. No evidence of mitral stenosis.  4. The aortic valve is normal in structure. Aortic valve regurgitation is not visualized. No aortic stenosis is present.  5. The inferior vena cava is normal in size with greater than 50% respiratory variability, suggesting right atrial pressure of 3 mmHg. FINDINGS  Left Ventricle: Left ventricular ejection fraction, by estimation, is 60 to 65%. The left ventricle has normal function. The left ventricle has no regional wall motion abnormalities. The left ventricular internal cavity size was normal in size. There is  borderline left ventricular hypertrophy. Left ventricular diastolic parameters were normal. Right Ventricle: The right ventricular size is normal. No increase  in right ventricular wall thickness. Right ventricular systolic function is normal. Left Atrium: Left atrial size was normal in size. Right Atrium: Right atrial size was normal in size. Pericardium: There is no evidence of pericardial effusion. Mitral Valve: The mitral valve is normal in structure. Trivial mitral valve regurgitation. No evidence of mitral valve stenosis. Tricuspid Valve: The tricuspid valve is normal in structure. Tricuspid valve regurgitation is trivial. No evidence of tricuspid stenosis. Aortic Valve: The aortic valve is normal in structure. Aortic valve regurgitation is not visualized. No aortic stenosis is present. Aortic valve peak gradient measures 7.1 mmHg. Pulmonic Valve: The pulmonic valve was normal in structure. Pulmonic valve regurgitation is not visualized. No evidence of pulmonic stenosis. Aorta: The aortic root is normal in size and structure. Venous: The inferior vena cava is normal in size with greater than 50% respiratory variability, suggesting right atrial pressure of 3 mmHg. IAS/Shunts: No atrial level shunt detected by color flow Doppler.  LEFT VENTRICLE PLAX 2D LVIDd:         4.50 cm    Diastology LVIDs:         3.20 cm   LV e' medial:    6.96 cm/s LV PW:         1.10 cm   LV E/e' medial:  10.5 LV IVS:        1.40 cm   LV e' lateral:   7.72 cm/s LVOT diam:     2.10 cm   LV E/e' lateral: 9.4 LVOT Area:     3.46 cm  RIGHT VENTRICLE RV Basal diam:  3.40 cm RV Mid diam:    3.00 cm RV S prime:     12.00 cm/s TAPSE (M-mode): 2.5 cm LEFT ATRIUM             Index        RIGHT ATRIUM           Index LA diam:        3.90 cm 1.78 cm/m   RA Area:     10.80 cm LA Vol (A2C):   50.0 ml 22.78 ml/m  RA Volume:   20.90 ml  9.52 ml/m LA Vol (A4C):   48.9 ml 22.28 ml/m LA Biplane Vol: 50.3 ml 22.92 ml/m  AORTIC VALVE                 PULMONIC VALVE AV Area (Vmax): 3.20 cm     PV Vmax:          1.56 m/s AV Vmax:        133.00 cm/s  PV Peak grad:     9.7 mmHg AV Peak Grad:   7.1 mmHg     PR End Diast Vel: 2.92 msec LVOT Vmax:      123.00 cm/s  RVOT Peak grad:   1 mmHg  AORTA Ao Root diam: 3.00 cm Ao Asc diam:  3.10 cm MITRAL VALVE               TRICUSPID VALVE MV Area (PHT): 2.79 cm    TR Peak grad:   11.6 mmHg MV Decel Time: 272 msec    TR Vmax:        170.00 cm/s MV E velocity: 72.80 cm/s MV A velocity: 60.80 cm/s  SHUNTS MV E/A ratio:  1.20        Systemic Diam: 2.10 cm MV A Prime:    7.0 cm/s Isaias Cowman MD Electronically signed by Isaias Cowman MD Signature Date/Time: 11/19/2021/3:30:17  PM    Final     ASSESSMENT: 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  PLAN:    Clinically Andrew Callahan is doing well.  He has recovered from his recent hospitalization although remains fairly inactive.  He has hospital follow-up next week and stress test.  Reviewed lab work with patient which showed hemoglobin of 12, ferritin of 17 and calcium level 8.5.  Okay to proceed with Delton See and Eligard today.  Return to clinic in 3 months for Xgeva injection and lab work.  Continue Zytiga 3 tablets daily.  Will discuss with Dr. Grayland Ormond reducing this dose further.  Anemia-hemoglobin  12.0 and ferritin is 17.  He has not been taking his iron supplements due to constipation.  I have asked his wife to see if he can tolerate Monday Wednesday Friday dosing of iron tablets.  We will recheck his labs at next visit and if still low will offer him some IV iron.  Hypocalcemia-calcium level 8.5 today.  Recommend 3-4 times daily for the next 3 to 4 days.  Proceed with Xgeva today.  Overactive bladder-causing insomnia.  He has been evaluated by urology and has had several treatments which have been ineffective.   Disposition- Start back iron supplements. 4 tums daily for 4 days. Delton See and Eligard today. Return to clinic in 3 months for follow-up with Xgeva injection and labs.  I spent 25 minutes dedicated to the care of this patient (face-to-face and non-face-to-face) on the date of the encounter to include what is described in the assessment and plan.  Patient expressed understanding and was in agreement with this plan. He also understands that He can call clinic at any time with any questions, concerns, or complaints.    Cancer Staging  Prostate cancer [facility]) Staging form: Prostate, AJCC 8th Edition - Clinical stage from 02/17/2020: Stage IVB (cT2c, cN0, cM1b, PSA: 8.6, Grade Group: 5) - Signed by Lloyd Huger, MD on 02/17/2020 Prostate specific antigen (PSA) range: Less than 10 Histologic grading system: 5 grade system   Jacquelin Hawking, NP   12/01/2021 10:21 AM

## 2021-12-19 ENCOUNTER — Other Ambulatory Visit: Payer: Self-pay | Admitting: Oncology

## 2021-12-19 DIAGNOSIS — C61 Malignant neoplasm of prostate: Secondary | ICD-10-CM

## 2021-12-26 ENCOUNTER — Encounter: Payer: Medicare Other | Attending: Pulmonary Disease | Admitting: *Deleted

## 2021-12-26 ENCOUNTER — Other Ambulatory Visit: Payer: Self-pay

## 2021-12-26 VITALS — Ht 70.5 in | Wt 238.3 lb

## 2021-12-26 DIAGNOSIS — G4733 Obstructive sleep apnea (adult) (pediatric): Secondary | ICD-10-CM | POA: Diagnosis present

## 2021-12-26 DIAGNOSIS — E119 Type 2 diabetes mellitus without complications: Secondary | ICD-10-CM | POA: Diagnosis not present

## 2021-12-26 NOTE — Progress Notes (Signed)
Pulmonary Individual Treatment Plan  Patient Details  Name: Damontre Millea MRN: [id number] Date of Birth: [birth year] Referring Provider:   Flowsheet Row Pulmonary Rehab from 12/26/2021 in [facility] and Pulmonary Rehab  Referring Provider Lanney Gins       Initial Encounter Date:  Flowsheet Row Pulmonary Rehab from 12/26/2021 in [facility] Cardiac and Pulmonary Rehab  Date 12/26/21       Visit Diagnosis: OSA (obstructive sleep apnea)  Patient's Home Medications on Admission:  Current Outpatient Medications:    abiraterone acetate (ZYTIGA) 250 MG tablet, TAKE 3 TABLETS (750 MG TOTAL) BY MOUTH DAILY. TAKE ON AN EMPTY STOMACH 1 HOUR BEFORE OR 2 HOURS AFTER A MEAL, Disp: 90 tablet, Rfl: 0   ACCU-CHEK GUIDE test strip, daily. use as directed, Disp: , Rfl:    Accu-Chek Softclix Lancets lancets, daily., Disp: , Rfl:    acyclovir ointment (ZOVIRAX) 5 %, Apply 1 application topically every 3 (three) hours. Apply to aa groin prn flares q 3 hours until resolved, Disp: 30 g, Rfl: 11   B Complex-C (SUPER B COMPLEX PO), Take 1 capsule by mouth daily. , Disp: , Rfl:    Blood Glucose Monitoring Suppl (ACCU-CHEK GUIDE) w/Device KIT, See admin instructions., Disp: , Rfl:    Calcium-Magnesium-Vitamin D (CALCIUM 1200+D3 PO), Take 2 tablets by mouth in the morning and at bedtime., Disp: , Rfl:    cetirizine (ZYRTEC) 10 MG tablet, Take 10 mg by mouth daily as needed for allergies., Disp: , Rfl:    denosumab (XGEVA) 120 MG/1.7ML SOLN injection, Inject 120 mg into the skin every 3 (three) months., Disp: , Rfl:    ELIQUIS 5 MG TABS tablet, Take 5 mg by mouth 2 (two) times daily., Disp: , Rfl:    glipiZIDE (GLUCOTROL XL) 10 MG 24 hr tablet, Take 5 mg by mouth daily., Disp: , Rfl:    hydrocortisone (CORTEF) 10 MG tablet, Take 10 mg by mouth 2 (two) times daily. , Disp: , Rfl:    Iron, Ferrous Sulfate, 325 (65 Fe) MG TABS, daily., Disp: , Rfl:    Leuprolide Acetate (ELIGARD [place]), Inject into the skin. 1 injection  every 6 months., Disp: , Rfl:    levothyroxine (SYNTHROID) 50 MCG tablet, Take 50 mcg by mouth daily before breakfast., Disp: , Rfl:    melatonin 5 MG TABS, Take 10 mg by mouth at bedtime., Disp: , Rfl:    Misc Natural Products (OSTEO BI-FLEX ADV JOINT SHIELD PO), Take 1 tablet by mouth daily. , Disp: , Rfl:    Multiple Vitamin (MULTIVITAMIN WITH MINERALS) TABS tablet, Take 1 tablet by mouth daily. One-A-Day Men's 50+, Disp: , Rfl:    polyethylene glycol (MIRALAX / GLYCOLAX) 17 g packet, Take 17 g by mouth daily. W/coffee, Disp: , Rfl:    Probiotic Product (ALIGN) 4 MG CAPS, Take 4 mg by mouth daily. , Disp: , Rfl:    triamcinolone cream (KENALOG) 0.1 %, Apply 1 application topically as directed. Qd to bid up to 5 days per week to right hand until clear, then prn flares, avoid face, groin, axilla, Disp: 80 g, Rfl: 1   Trospium Chloride 60 MG CP24, Take 1 capsule (60 mg total) by mouth daily. (Patient not taking: Reported on 11/18/2021), Disp: 30 capsule, Rfl: 11   valsartan (DIOVAN) 320 MG tablet, Take 160 mg by mouth 2 (two) times daily., Disp: , Rfl:   Past Medical History: Past Medical History:  Diagnosis Date   Acquired hypothyroidism    Aortic atherosclerosis ([place])  Atrial fibrillation (HCC)    Chronic constipation    CVA (cerebral vascular accident) ([place])    DDD (degenerative disc disease), cervical    Diabetes mellitus without complication ([place])    Diverticulosis    Elevated prostate specific antigen (PSA)    Family history of leukemia    Family history of lung cancer    History of kidney stones    History of pituitary adenoma    Hyperlipidemia    Hypertension    Hypogonadism in male    Hypothyroid    Hypothyroidism    IDA (iron deficiency anemia)    Lumbar disc disease    Nephrolithiasis    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 Mohs.   Stroke ([place])    throat / slight difficulty swallowing    Tobacco Use: Social History   Tobacco Use  Smoking Status Former  Smokeless Tobacco Never  Tobacco Comments   quit 50 years ago    Labs: Recent Review Scientist, physiological     Labs for ITP Cardiac and Pulmonary Rehab Latest Ref Rng & Units 11/18/2021   Hemoglobin A1c 4.8 - 5.6 % 5.9(H)        Pulmonary Assessment Scores:  Pulmonary Assessment Scores     Row Name 12/26/21 1604         CAT Score   CAT Score 18       mMRC Score   mMRC Score 1              UCSD: Self-administered rating of dyspnea associated with activities of daily living (ADLs) 6-point scale (0 = "not at all" to 5 = "maximal or unable to do because of breathlessness")  Scoring Scores range from 0 to 120.  Minimally important difference is 5 units  CAT: CAT can identify the health impairment of COPD patients and is better correlated with disease progression.  CAT has a scoring range of zero to 40. The CAT score is classified into four groups of low (less than 10), medium (10 - 20), high (21-30) and very high (31-40) based on the impact level of disease on health status. A CAT score over 10 suggests significant symptoms.  A worsening CAT score could be explained by an exacerbation, poor medication adherence, poor inhaler technique, or progression of COPD or comorbid conditions.  CAT MCID is 2 points  mMRC: mMRC (Modified Medical Research Council) Dyspnea Scale is used to assess the degree of baseline functional disability in patients of respiratory disease due to dyspnea. No minimal important difference is established. A decrease in score of 1 point or greater is considered a positive change.   Pulmonary Function Assessment:   Exercise Target Goals: Exercise Program Goal: Individual exercise prescription set using results from initial 6 min walk test and THRR while considering   patients activity barriers and safety.   Exercise Prescription Goal: Initial exercise prescription builds to 30-45 minutes a day of aerobic activity, 2-3 days per week.  Home exercise guidelines will be given to patient during program as part of exercise prescription that the participant will acknowledge.  Education: Aerobic Exercise: - Group verbal and visual presentation on the components of exercise prescription. Introduces F.I.T.T principle from ACSM for exercise prescriptions.  Reviews F.I.T.T. principles of aerobic exercise including  progression. Written material given at graduation.   Education: Resistance Exercise: - Group verbal and visual presentation on the components of exercise prescription. Introduces F.I.T.T principle from ACSM for exercise prescriptions  Reviews F.I.T.T. principles of resistance exercise including progression. Written material given at graduation.    Education: Exercise & Equipment Safety: - Individual verbal instruction and demonstration of equipment use and safety with use of the equipment. Flowsheet Row Pulmonary Rehab from 12/26/2021 in [facility] Cardiac and Pulmonary Rehab  Date 12/26/21  Educator [facility]  Instruction Review Code 1- Verbalizes Understanding       Education: Exercise Physiology & General Exercise Guidelines: - Group verbal and written instruction with models to review the exercise physiology of the cardiovascular system and associated critical values. Provides general exercise guidelines with specific guidelines to those with heart or lung disease.    Education: Flexibility, Balance, Mind/Body Relaxation: - Group verbal and visual presentation with interactive activity on the components of exercise prescription. Introduces F.I.T.T principle from ACSM for exercise prescriptions. Reviews F.I.T.T. principles of flexibility and balance exercise training including progression. Also discusses the mind body connection.  Reviews various relaxation  techniques to help reduce and manage stress (i.e. Deep breathing, progressive muscle relaxation, and visualization). Balance handout provided to take home. Written material given at graduation.   Activity Barriers & Risk Stratification:  Activity Barriers & Cardiac Risk Stratification - 11/14/21 1402       Activity Barriers & Cardiac Risk Stratification   Activity Barriers Balance Concerns;Muscular Weakness   history of stroke; metastatic prostate cancer (overactive bladder)            6 Minute Walk:  6 Minute Walk     Row Name 12/26/21 1550         6 Minute Walk   Phase Initial     Distance 540 feet     Walk Time 6 minutes     # of Rest Breaks 0     MPH 1.02     METS 0.87     RPE 13     Perceived Dyspnea  0     Symptoms No     Resting HR 60 bpm     Resting BP 170/90     Resting Oxygen Saturation  96 %     Exercise Oxygen Saturation  during 6 min walk 96 %     Max Ex. HR 101 bpm     Max Ex. BP 190/80     2 Minute Post BP 156/82       Interval HR   1 Minute HR 83     2 Minute HR 93     3 Minute HR 87     4 Minute HR 87     5 Minute HR 85     6 Minute HR 101     2 Minute Post HR 69     Interval Heart Rate? Yes       Interval Oxygen   Interval Oxygen? Yes     Baseline Oxygen Saturation % 96 %     1 Minute Oxygen Saturation % 96 %     1 Minute Liters of Oxygen 0 L     2 Minute Oxygen Saturation % 96 %     2 Minute Liters of Oxygen 0 L     3 Minute Oxygen Saturation % 96 %     3 Minute Liters of Oxygen 0 L     4 Minute Oxygen Saturation % 96 %  4 Minute Liters of Oxygen 0 L     5 Minute Oxygen Saturation % 96 %     5 Minute Liters of Oxygen 0 L     6 Minute Oxygen Saturation % 96 %     6 Minute Liters of Oxygen 0 L     2 Minute Post Oxygen Saturation % 98 %     2 Minute Post Liters of Oxygen 0 L             Oxygen Initial Assessment:  Oxygen Initial Assessment - 11/14/21 1405       Home Oxygen   Home Oxygen Device None    Sleep Oxygen  Prescription CPAP    Compliance with Home Oxygen Use Yes      Initial 6 min Walk   Oxygen Used None      Program Oxygen Prescription   Program Oxygen Prescription None      Intervention   Short Term Goals To learn and understand importance of monitoring SPO2 with pulse oximeter and demonstrate accurate use of the pulse oximeter.;To learn and understand importance of maintaining oxygen saturations>88%;To learn and demonstrate proper pursed lip breathing techniques or other breathing techniques. ;To learn and demonstrate proper use of respiratory medications    Long  Term Goals Verbalizes importance of monitoring SPO2 with pulse oximeter and return demonstration;Maintenance of O2 saturations>88%;Compliance with respiratory medication;Exhibits proper breathing techniques, such as pursed lip breathing or other method taught during program session;Demonstrates proper use of MDIs             Oxygen Re-Evaluation:   Oxygen Discharge (Final Oxygen Re-Evaluation):   Initial Exercise Prescription:  Initial Exercise Prescription - 12/26/21 1500       Date of Initial Exercise RX and Referring Provider   Date 12/26/21    Referring Provider Aleskerov      Oxygen   Maintain Oxygen Saturation 88% or higher      Treadmill   MPH 0.8    Grade 0    Minutes 15    METs 1      Recumbant Bike   Level 1    RPM 60    Minutes 15    METs 1      NuStep   Level 1    SPM 80    Minutes 15    METs 1      Prescription Details   Frequency (times per week) 2    Duration Progress to 30 minutes of continuous aerobic without signs/symptoms of physical distress      Intensity   THRR 40-80% of Max Heartrate 90-120    Ratings of Perceived Exertion 11-13    Perceived Dyspnea 0-4      Progression   Progression Continue to progress workloads to maintain intensity without signs/symptoms of physical distress.      Resistance Training   Training Prescription Yes    Weight 4.0    Reps 10-15              Perform Capillary Blood Glucose checks as needed.  Exercise Prescription Changes:   Exercise Prescription Changes     Row Name 12/26/21 1500             Response to Exercise   Blood Pressure (Admit) 170/90       Blood Pressure (Exercise) 190/80       Blood Pressure (Exit) 140/80       Heart Rate (Admit) 60 bpm  Heart Rate (Exercise) 101 bpm       Heart Rate (Exit) 69 bpm       Oxygen Saturation (Admit) 96 %       Oxygen Saturation (Exercise) 96 %       Oxygen Saturation (Exit) 98 %       Rating of Perceived Exertion (Exercise) 13       Perceived Dyspnea (Exercise) 0       Symptoms none       Comments 6 MWT results                Exercise Comments:   Exercise Goals and Review:   Exercise Goals     Row Name 12/26/21 1554             Exercise Goals   Increase Physical Activity Yes       Intervention Provide advice, education, support and counseling about physical activity/exercise needs.;Develop an individualized exercise prescription for aerobic and resistive training based on initial evaluation findings, risk stratification, comorbidities and participant's personal goals.       Expected Outcomes Short Term: Attend rehab on a regular basis to increase amount of physical activity.;Long Term: Add in home exercise to make exercise part of routine and to increase amount of physical activity.;Long Term: Exercising regularly at least 3-5 days a week.       Increase Strength and Stamina Yes       Intervention Provide advice, education, support and counseling about physical activity/exercise needs.;Develop an individualized exercise prescription for aerobic and resistive training based on initial evaluation findings, risk stratification, comorbidities and participant's personal goals.       Expected Outcomes Short Term: Increase workloads from initial exercise prescription for resistance, speed, and METs.;Short Term: Perform resistance training  exercises routinely during rehab and add in resistance training at home;Long Term: Improve cardiorespiratory fitness, muscular endurance and strength as measured by increased METs and functional capacity (6MWT)       Able to understand and use rate of perceived exertion (RPE) scale Yes       Intervention Provide education and explanation on how to use RPE scale       Expected Outcomes Short Term: Able to use RPE daily in rehab to express subjective intensity level;Long Term:  Able to use RPE to guide intensity level when exercising independently       Able to understand and use Dyspnea scale Yes       Intervention Provide education and explanation on how to use Dyspnea scale       Expected Outcomes Short Term: Able to use Dyspnea scale daily in rehab to express subjective sense of shortness of breath during exertion;Long Term: Able to use Dyspnea scale to guide intensity level when exercising independently       Knowledge and understanding of Target Heart Rate Range (THRR) Yes       Intervention Provide education and explanation of THRR including how the numbers were predicted and where they are located for reference       Expected Outcomes Short Term: Able to state/look up THRR;Long Term: Able to use THRR to govern intensity when exercising independently;Short Term: Able to use daily as guideline for intensity in rehab       Able to check pulse independently Yes       Intervention Provide education and demonstration on how to check pulse in carotid and radial arteries.;Review the importance of being able to check your own pulse for safety  during independent exercise       Expected Outcomes Short Term: Able to explain why pulse checking is important during independent exercise;Long Term: Able to check pulse independently and accurately       Understanding of Exercise Prescription Yes       Intervention Provide education, explanation, and written materials on patient's individual exercise prescription        Expected Outcomes Short Term: Able to explain program exercise prescription;Long Term: Able to explain home exercise prescription to exercise independently                Exercise Goals Re-Evaluation :   Discharge Exercise Prescription (Final Exercise Prescription Changes):  Exercise Prescription Changes - 12/26/21 1500       Response to Exercise   Blood Pressure (Admit) 170/90    Blood Pressure (Exercise) 190/80    Blood Pressure (Exit) 140/80    Heart Rate (Admit) 60 bpm    Heart Rate (Exercise) 101 bpm    Heart Rate (Exit) 69 bpm    Oxygen Saturation (Admit) 96 %    Oxygen Saturation (Exercise) 96 %    Oxygen Saturation (Exit) 98 %    Rating of Perceived Exertion (Exercise) 13    Perceived Dyspnea (Exercise) 0    Symptoms none    Comments 6 MWT results             Nutrition:  Target Goals: Understanding of nutrition guidelines, daily intake of sodium <1560m, cholesterol <2075m calories 30% from fat and 7% or less from saturated fats, daily to have 5 or more servings of fruits and vegetables.  Education: All About Nutrition: -Group instruction provided by verbal, written material, interactive activities, discussions, models, and posters to present general guidelines for heart healthy nutrition including fat, fiber, MyPlate, the role of sodium in heart healthy nutrition, utilization of the nutrition label, and utilization of this knowledge for meal planning. Follow up email sent as well. Written material given at graduation.   Biometrics:  Pre Biometrics - 12/26/21 1554       Pre Biometrics   Height 5' 10.5" (1.791 m)    Weight 238 lb 4.8 oz (108.1 kg)    BMI (Calculated) 33.7              Nutrition Therapy Plan and Nutrition Goals:  Nutrition Therapy & Goals - 12/26/21 1602       Intervention Plan   Intervention Prescribe, educate and counsel regarding individualized specific dietary modifications aiming towards targeted core components such as  weight, hypertension, lipid management, diabetes, heart failure and other comorbidities.    Expected Outcomes Short Term Goal: Understand basic principles of dietary content, such as calories, fat, sodium, cholesterol and nutrients.;Short Term Goal: A plan has been developed with personal nutrition goals set during dietitian appointment.;Long Term Goal: Adherence to prescribed nutrition plan.             Nutrition Assessments:  MEDIFICTS Score Key: ?70 Need to make dietary changes  40-70 Heart Healthy Diet ? 40 Therapeutic Level Cholesterol Diet  [facility]  Picture Your Plate Total Score on Admission 52      Picture Your Plate Scores: <4<44nhealthy dietary pattern with much room for improvement. 41-50 Dietary pattern unlikely to meet recommendations for good health and room for improvement. 51-60 More healthful dietary pattern, with some room for improvement.  >60 Healthy dietary pattern, although there may be some specific behaviors that  could be improved.   Nutrition Goals Re-Evaluation:   Nutrition Goals Discharge (Final Nutrition Goals Re-Evaluation):   Psychosocial: Target Goals: Acknowledge presence or absence of significant depression and/or stress, maximize coping skills, provide positive support system. Participant is able to verbalize types and ability to use techniques and skills needed for reducing stress and depression.   Education: Stress, Anxiety, and Depression - Group verbal and visual presentation to define topics covered.  Reviews how body is impacted by stress, anxiety, and depression.  Also discusses healthy ways to reduce stress and to treat/manage anxiety and depression.  Written material given at graduation.   Education: Sleep Hygiene -Provides group verbal and written instruction about how sleep can affect your health.  Define sleep hygiene, discuss sleep cycles and impact of sleep  habits. Review good sleep hygiene tips.    Initial Review & Psychosocial Screening:  Initial Psych Review & Screening - 11/14/21 1422       Initial Review   Current issues with Current Sleep Concerns;Current Stress Concerns    Source of Stress Concerns Chronic Illness      Family Dynamics   Good Support System? Yes   wife     Barriers   Psychosocial barriers to participate in program There are no identifiable barriers or psychosocial needs.;The patient should benefit from training in stress management and relaxation.      Screening Interventions   Interventions Encouraged to exercise;Provide feedback about the scores to participant;To provide support and resources with identified psychosocial needs    Expected Outcomes Short Term goal: Utilizing psychosocial counselor, staff and physician to assist with identification of specific Stressors or current issues interfering with healing process. Setting desired goal for each stressor or current issue identified.;Long Term Goal: Stressors or current issues are controlled or eliminated.;Short Term goal: Identification and review with participant of any Quality of Life or Depression concerns found by scoring the questionnaire.;Long Term goal: The participant improves quality of Life and PHQ9 Scores as seen by post scores and/or verbalization of changes             Quality of Life Scores:  Scores of 19 and below usually indicate a poorer quality of life in these areas.  A difference of  2-3 points is a clinically meaningful difference.  A difference of 2-3 points in the total score of the Quality of Life Index has been associated with significant improvement in overall quality of life, self-image, physical symptoms, and general health in studies assessing change in quality of life.  PHQ-9: Recent Review Flowsheet Data     Depression screen [facility] 2/9 12/26/2021   Decreased Interest 0   Down, Depressed, Hopeless 1   PHQ - 2 Score 1   Altered  sleeping 3   Tired, decreased energy 3   Change in appetite 0   Feeling bad or failure about yourself  0   Trouble concentrating 0   Moving slowly or fidgety/restless 1   Suicidal thoughts 0   PHQ-9 Score 8   Difficult doing work/chores Somewhat difficult      Interpretation of Total Score  Total Score Depression Severity:  1-4 = Minimal depression, 5-9 = Mild depression, 10-14 = Moderate depression, 15-19 = Moderately severe depression, 20-27 = Severe depression   Psychosocial Evaluation and Intervention:  Psychosocial Evaluation - 11/14/21 1422       Psychosocial Evaluation & Interventions   Interventions Encouraged to exercise with the program and follow exercise prescription;Stress management education;Relaxation education    Comments  Roby is coming to pulmonary rehab with OSA. He has become deconditioned post stroke, prostate cancer and other medical issues. The stroke left him weak and requires him to use a rollator. His biggest stressor is his frequent urination which impacts his daily life and especially his sleep routine. He is up multiple times a night which leads to excessive daytime sleepiness. He doesn't feel like being active during the day becasue he is so tired. He is in contact with his MD and have been trialing different options to help in this area with little success. His wife is his main support system and caretaker. She and he are both wanting him to become stronger and more able to do things himself, however he is hesitant about progress when it comes to how tired he feels during the day.    Expected Outcomes Short: attend pulmonary rehab for education and exercise. Long: Develop and maintain positive self care habits.    Continue Psychosocial Services  Follow up required by staff             Psychosocial Re-Evaluation:   Psychosocial Discharge (Final Psychosocial Re-Evaluation):   Education: Education Goals: Education classes will be provided on a  weekly basis, covering required topics. Participant will state understanding/return demonstration of topics presented.  Learning Barriers/Preferences:  Learning Barriers/Preferences - 11/14/21 1422       Learning Barriers/Preferences   Learning Barriers None    Learning Preferences Individual Instruction             General Pulmonary Education Topics:  Infection Prevention: - Provides verbal and written material to individual with discussion of infection control including proper hand washing and proper equipment cleaning during exercise session. Flowsheet Row Pulmonary Rehab from 12/26/2021 in [facility] Cardiac and Pulmonary Rehab  Date 12/26/21  Educator [facility]  Instruction Review Code 1- Verbalizes Understanding       Falls Prevention: - Provides verbal and written material to individual with discussion of falls prevention and safety. Flowsheet Row Pulmonary Rehab from 12/26/2021 in [facility] Cardiac and Pulmonary Rehab  Date 12/26/21  Educator [facility]  Instruction Review Code 1- Verbalizes Understanding       Chronic Lung Disease Review: - Group verbal instruction with posters, models, PowerPoint presentations and videos,  to review new updates, new respiratory medications, new advancements in procedures and treatments. Providing information on websites and "800" numbers for continued self-education. Includes information about supplement oxygen, available portable oxygen systems, continuous and intermittent flow rates, oxygen safety, concentrators, and Medicare reimbursement for oxygen. Explanation of Pulmonary Drugs, including class, frequency, complications, importance of spacers, rinsing mouth after steroid MDI's, and proper cleaning methods for nebulizers. Review of basic lung anatomy and physiology related to function, structure, and complications of lung disease. Review of risk factors. Discussion about methods for diagnosing sleep apnea and types of masks and machines for OSA. Includes a  review of the use of types of environmental controls: home humidity, furnaces, filters, dust mite/pet prevention, HEPA vacuums. Discussion about weather changes, air quality and the benefits of nasal washing. Instruction on Warning signs, infection symptoms, calling MD promptly, preventive modes, and value of vaccinations. Review of effective airway clearance, coughing and/or vibration techniques. Emphasizing that all should Create an Action Plan. Written material given at graduation. Flowsheet Row Pulmonary Rehab from 12/26/2021 in [facility] Cardiac and Pulmonary Rehab  Education need identified 12/26/21       AED/CPR: - Group verbal and written instruction with the use of models to demonstrate the basic use of  the AED with the basic ABC's of resuscitation.    Anatomy and Cardiac Procedures: - Group verbal and visual presentation and models provide information about basic cardiac anatomy and function. Reviews the testing methods done to diagnose heart disease and the outcomes of the test results. Describes the treatment choices: Medical Management, Angioplasty, or Coronary Bypass Surgery for treating various heart conditions including Myocardial Infarction, Angina, Valve Disease, and Cardiac Arrhythmias.  Written material given at graduation.   Medication Safety: - Group verbal and visual instruction to review commonly prescribed medications for heart and lung disease. Reviews the medication, class of the drug, and side effects. Includes the steps to properly store meds and maintain the prescription regimen.  Written material given at graduation.   Other: -Provides group and verbal instruction on various topics (see comments)   Knowledge Questionnaire Score:  Knowledge Questionnaire Score - 12/26/21 1602       Knowledge Questionnaire Score   Pre Score 9/18              Core Components/Risk Factors/Patient Goals at Admission:  Personal Goals and Risk Factors at Admission - 12/26/21  1600       Core Components/Risk Factors/Patient Goals on Admission    Weight Management Yes    Intervention Weight Management: Develop a combined nutrition and exercise program designed to reach desired caloric intake, while maintaining appropriate intake of nutrient and fiber, sodium and fats, and appropriate energy expenditure required for the weight goal.;Weight Management: Provide education and appropriate resources to help participant work on and attain dietary goals.;Weight Management/Obesity: Establish reasonable short term and long term weight goals.;Obesity: Provide education and appropriate resources to help participant work on and attain dietary goals.    Admit Weight 238 lb 4.8 oz (108.1 kg)    Goal Weight: Short Term 233 lb (105.7 kg)    Goal Weight: Long Term 228 lb (103.4 kg)    Expected Outcomes Short Term: Continue to assess and modify interventions until short term weight is achieved;Long Term: Adherence to nutrition and physical activity/exercise program aimed toward attainment of established weight goal;Understanding recommendations for meals to include 15-35% energy as protein, 25-35% energy from fat, 35-60% energy from carbohydrates, less than 28m of dietary cholesterol, 20-35 gm of total fiber daily;Understanding of distribution of calorie intake throughout the day with the consumption of 4-5 meals/snacks;Weight Loss: Understanding of general recommendations for a balanced deficit meal plan, which promotes 1-2 lb weight loss per week and includes a negative energy balance of [phone] kcal/d    Diabetes Yes    Intervention Provide education about signs/symptoms and action to take for hypo/hyperglycemia.;Provide education about proper nutrition, including hydration, and aerobic/resistive exercise prescription along with prescribed medications to achieve blood glucose in normal ranges: Fasting glucose 65-99 mg/dL    Expected Outcomes Long Term: Attainment of HbA1C < 7%.;Short Term:  Participant verbalizes understanding of the signs/symptoms and immediate care of hyper/hypoglycemia, proper foot care and importance of medication, aerobic/resistive exercise and nutrition plan for blood glucose control.    Hypertension Yes    Intervention Provide education on lifestyle modifcations including regular physical activity/exercise, weight management, moderate sodium restriction and increased consumption of fresh fruit, vegetables, and low fat dairy, alcohol moderation, and smoking cessation.;Monitor prescription use compliance.    Expected Outcomes Short Term: Continued assessment and intervention until BP is < 140/96mHG in hypertensive participants. < 130/8027mG in hypertensive participants with diabetes, heart failure or chronic kidney disease.;Long Term: Maintenance of blood pressure at goal levels.  Lipids Yes    Intervention Provide education and support for participant on nutrition & aerobic/resistive exercise along with prescribed medications to achieve LDL <39m, HDL >469m    Expected Outcomes Short Term: Participant states understanding of desired cholesterol values and is compliant with medications prescribed. Participant is following exercise prescription and nutrition guidelines.;Long Term: Cholesterol controlled with medications as prescribed, with individualized exercise RX and with personalized nutrition plan. Value goals: LDL < 7076mHDL > 40 mg.             Education:Diabetes - Individual verbal and written instruction to review signs/symptoms of diabetes, desired ranges of glucose level fasting, after meals and with exercise. Acknowledge that pre and post exercise glucose checks will be done for 3 sessions at entry of program.   Know Your Numbers and Heart Failure: - Group verbal and visual instruction to discuss disease risk factors for cardiac and pulmonary disease and treatment options.  Reviews associated critical values for Overweight/Obesity,  Hypertension, Cholesterol, and Diabetes.  Discusses basics of heart failure: signs/symptoms and treatments.  Introduces Heart Failure Zone chart for action plan for heart failure.  Written material given at graduation.   Core Components/Risk Factors/Patient Goals Review:    Core Components/Risk Factors/Patient Goals at Discharge (Final Review):    ITP Comments:  ITP Comments     Row Name 11/14/21 1428 12/26/21 1545         ITP Comments Initial telephone orientation completed. [facility] EP orientation scheduled for Wednesday 12/28 at 1:30 pm. Completed 6MWT and gym orientation. Initial ITP created and sent for review to Dr. FauZetta Billsedical Director.               Comments: initial ITP

## 2021-12-26 NOTE — Patient Instructions (Signed)
Patient Instructions  Patient Details  Name: Andrew Callahan MRN: [id number] Date of Birth: January 17, 1934 Referring Provider:  Ottie Glazier, MD  Below are your personal goals for exercise, nutrition, and risk factors. Our goal is to help you stay on track towards obtaining and maintaining these goals. We will be discussing your progress on these goals with you throughout the program.  Initial Exercise Prescription:  Initial Exercise Prescription - 12/26/21 1500       Date of Initial Exercise RX and Referring Provider   Date 12/26/21    Referring Provider Aleskerov      Oxygen   Maintain Oxygen Saturation 88% or higher      Treadmill   MPH 0.8    Grade 0    Minutes 15    METs 1      Recumbant Bike   Level 1    RPM 60    Minutes 15    METs 1      NuStep   Level 1    SPM 80    Minutes 15    METs 1      Prescription Details   Frequency (times per week) 2    Duration Progress to 30 minutes of continuous aerobic without signs/symptoms of physical distress      Intensity   THRR 40-80% of Max Heartrate 90-120    Ratings of Perceived Exertion 11-13    Perceived Dyspnea 0-4      Progression   Progression Continue to progress workloads to maintain intensity without signs/symptoms of physical distress.      Resistance Training   Training Prescription Yes    Weight 4.0    Reps 10-15             Exercise Goals: Frequency: Be able to perform aerobic exercise two to three times per week in program working toward 2-5 days per week of home exercise.  Intensity: Work with a perceived exertion of 11 (fairly light) - 15 (hard) while following your exercise prescription.  We will make changes to your prescription with you as you progress through the program.   Duration: Be able to do 30 to 45 minutes of continuous aerobic exercise in addition to a 5 minute warm-up and a 5 minute cool-down routine.   Nutrition Goals: Your personal nutrition goals will be established when  you do your nutrition analysis with the dietician.  The following are general nutrition guidelines to follow: Cholesterol < 200mg /day Sodium < 1500mg /day Fiber: Men over 50 yrs - 30 grams per day  Personal Goals:  Personal Goals and Risk Factors at Admission - 12/26/21 1600       Core Components/Risk Factors/Patient Goals on Admission    Weight Management Yes    Intervention Weight Management: Develop a combined nutrition and exercise program designed to reach desired caloric intake, while maintaining appropriate intake of nutrient and fiber, sodium and fats, and appropriate energy expenditure required for the weight goal.;Weight Management: Provide education and appropriate resources to help participant work on and attain dietary goals.;Weight Management/Obesity: Establish reasonable short term and long term weight goals.;Obesity: Provide education and appropriate resources to help participant work on and attain dietary goals.    Admit Weight 238 lb 4.8 oz (108.1 kg)    Goal Weight: Short Term 233 lb (105.7 kg)    Goal Weight: Long Term 228 lb (103.4 kg)    Expected Outcomes Short Term: Continue to assess and modify interventions until short term weight is achieved;Long Term: Adherence to  nutrition and physical activity/exercise program aimed toward attainment of established weight goal;Understanding recommendations for meals to include 15-35% energy as protein, 25-35% energy from fat, 35-60% energy from carbohydrates, less than 200mg  of dietary cholesterol, 20-35 gm of total fiber daily;Understanding of distribution of calorie intake throughout the day with the consumption of 4-5 meals/snacks;Weight Loss: Understanding of general recommendations for a balanced deficit meal plan, which promotes 1-2 lb weight loss per week and includes a negative energy balance of [phone] kcal/d    Diabetes Yes    Intervention Provide education about signs/symptoms and action to take for  hypo/hyperglycemia.;Provide education about proper nutrition, including hydration, and aerobic/resistive exercise prescription along with prescribed medications to achieve blood glucose in normal ranges: Fasting glucose 65-99 mg/dL    Expected Outcomes Long Term: Attainment of HbA1C < 7%.;Short Term: Participant verbalizes understanding of the signs/symptoms and immediate care of hyper/hypoglycemia, proper foot care and importance of medication, aerobic/resistive exercise and nutrition plan for blood glucose control.    Hypertension Yes    Intervention Provide education on lifestyle modifcations including regular physical activity/exercise, weight management, moderate sodium restriction and increased consumption of fresh fruit, vegetables, and low fat dairy, alcohol moderation, and smoking cessation.;Monitor prescription use compliance.    Expected Outcomes Short Term: Continued assessment and intervention until BP is < 140/47mm HG in hypertensive participants. < 130/32mm HG in hypertensive participants with diabetes, heart failure or chronic kidney disease.;Long Term: Maintenance of blood pressure at goal levels.    Lipids Yes    Intervention Provide education and support for participant on nutrition & aerobic/resistive exercise along with prescribed medications to achieve LDL 70mg , HDL >40mg .    Expected Outcomes Short Term: Participant states understanding of desired cholesterol values and is compliant with medications prescribed. Participant is following exercise prescription and nutrition guidelines.;Long Term: Cholesterol controlled with medications as prescribed, with individualized exercise RX and with personalized nutrition plan. Value goals: LDL < 70mg , HDL > 40 mg.             Tobacco Use Initial Evaluation: Social History   Tobacco Use  Smoking Status Former  Smokeless Tobacco Never  Tobacco Comments   quit 50 years ago    Exercise Goals and Review:  Exercise Goals     Row  Name 12/26/21 1554             Exercise Goals   Increase Physical Activity Yes       Intervention Provide advice, education, support and counseling about physical activity/exercise needs.;Develop an individualized exercise prescription for aerobic and resistive training based on initial evaluation findings, risk stratification, comorbidities and participant's personal goals.       Expected Outcomes Short Term: Attend rehab on a regular basis to increase amount of physical activity.;Long Term: Add in home exercise to make exercise part of routine and to increase amount of physical activity.;Long Term: Exercising regularly at least 3-5 days a week.       Increase Strength and Stamina Yes       Intervention Provide advice, education, support and counseling about physical activity/exercise needs.;Develop an individualized exercise prescription for aerobic and resistive training based on initial evaluation findings, risk stratification, comorbidities and participant's personal goals.       Expected Outcomes Short Term: Increase workloads from initial exercise prescription for resistance, speed, and METs.;Short Term: Perform resistance training exercises routinely during rehab and add in resistance training at home;Long Term: Improve cardiorespiratory fitness, muscular endurance and strength as measured by increased METs and functional  capacity (6MWT)       Able to understand and use rate of perceived exertion (RPE) scale Yes       Intervention Provide education and explanation on how to use RPE scale       Expected Outcomes Short Term: Able to use RPE daily in rehab to express subjective intensity level;Long Term:  Able to use RPE to guide intensity level when exercising independently       Able to understand and use Dyspnea scale Yes       Intervention Provide education and explanation on how to use Dyspnea scale       Expected Outcomes Short Term: Able to use Dyspnea scale daily in rehab to express  subjective sense of shortness of breath during exertion;Long Term: Able to use Dyspnea scale to guide intensity level when exercising independently       Knowledge and understanding of Target Heart Rate Range (THRR) Yes       Intervention Provide education and explanation of THRR including how the numbers were predicted and where they are located for reference       Expected Outcomes Short Term: Able to state/look up THRR;Long Term: Able to use THRR to govern intensity when exercising independently;Short Term: Able to use daily as guideline for intensity in rehab       Able to check pulse independently Yes       Intervention Provide education and demonstration on how to check pulse in carotid and radial arteries.;Review the importance of being able to check your own pulse for safety during independent exercise       Expected Outcomes Short Term: Able to explain why pulse checking is important during independent exercise;Long Term: Able to check pulse independently and accurately       Understanding of Exercise Prescription Yes       Intervention Provide education, explanation, and written materials on patient's individual exercise prescription       Expected Outcomes Short Term: Able to explain program exercise prescription;Long Term: Able to explain home exercise prescription to exercise independently                Copy of goals given to participant.

## 2021-12-27 NOTE — Telephone Encounter (Signed)
Oral Oncology Patient Advocate Encounter   Received notification from [place] that the existing prior authorization for Andrew Callahan is due for renewal.   Renewal PA faxed to [phone] on 12/26/21 Status is pending   [facility] will continue to follow.  [place] Patient [place] Phone [phone] Fax [phone] 12/27/2021 9:05 AM

## 2021-12-27 NOTE — Telephone Encounter (Signed)
Oral Oncology Patient Advocate Encounter  Prior Authorization for Andrew Callahan has been approved.    Effective dates: 11/26/21 through 12/26/22  [facility] Clinic will continue to follow.   [place] Patient [name] Phone [phone] Fax [phone] 12/27/2021 9:06 AM

## 2021-12-28 ENCOUNTER — Other Ambulatory Visit: Payer: Self-pay

## 2021-12-28 DIAGNOSIS — G4733 Obstructive sleep apnea (adult) (pediatric): Secondary | ICD-10-CM | POA: Diagnosis not present

## 2021-12-28 LAB — GLUCOSE, CAPILLARY
Glucose-Capillary: 140 mg/dL — ABNORMAL HIGH (ref 70–99)
Glucose-Capillary: 79 mg/dL (ref 70–99)

## 2021-12-28 NOTE — Progress Notes (Signed)
Daily Session Note  Patient Details  Name: Andrew Callahan MRN: [id number] Date of Birth: March 01, 1934 Referring Provider:   Flowsheet Row Pulmonary Rehab from 12/26/2021 in [facility] Cardiac and Pulmonary Rehab  Referring Provider Lanney Gins       Encounter Date: 12/28/2021  Check In:  Session Check In - 12/28/21 1028       Check-In   Supervising physician immediately available to respond to emergencies See telemetry face sheet for immediately available ER MD    Location [facility]-Cardiac & Pulmonary Rehab    Staff Present Birdie Sons, MPA, Elveria Rising, BA, ACSM CEP, Exercise Physiologist;Joseph Tessie Fass, [place]    Virtual Visit No    Medication changes reported     No    Fall or balance concerns reported    No    Warm-up and Cool-down Performed on first and last piece of equipment    Resistance Training Performed Yes    VAD Patient? No    PAD/SET Patient? No      Pain Assessment   Currently in Pain? No/denies                Social History   Tobacco Use  Smoking Status Former  Smokeless Tobacco Never  Tobacco Comments   quit 50 years ago    Goals Met:  Independence with exercise equipment Exercise tolerated well No report of concerns or symptoms today Strength training completed today  Goals Unmet:  Not Applicable  Comments: First full day of exercise!  Patient was oriented to gym and equipment including functions, settings, policies, and procedures.  Patient's individual exercise prescription and treatment plan were reviewed.  All starting workloads were established based on the results of the 6 minute walk test done at initial orientation visit.  The plan for exercise progression was also introduced and progression will be customized based on patient's performance and goals.    Dr. Emily Filbert is Medical Director for [place].  Dr. Ottie Glazier is Medical Director for [facility] Pulmonary Rehabilitation.

## 2022-01-02 ENCOUNTER — Encounter: Payer: Medicare Other | Admitting: *Deleted

## 2022-01-02 ENCOUNTER — Other Ambulatory Visit: Payer: Self-pay

## 2022-01-02 DIAGNOSIS — G4733 Obstructive sleep apnea (adult) (pediatric): Secondary | ICD-10-CM

## 2022-01-02 LAB — GLUCOSE, CAPILLARY
Glucose-Capillary: 125 mg/dL — ABNORMAL HIGH (ref 70–99)
Glucose-Capillary: 85 mg/dL (ref 70–99)

## 2022-01-02 NOTE — Progress Notes (Signed)
Daily Session Note  Patient Details  Name: Andrew Callahan MRN: [id number] Date of Birth: [birth year] Referring Provider:   Flowsheet Row Pulmonary Rehab from 12/26/2021 in [facility] Cardiac and Pulmonary Rehab  Referring Provider Lanney Gins       Encounter Date: 01/02/2022  Check In:  Session Check In - 01/02/22 1103       Check-In   Supervising physician immediately available to respond to emergencies See telemetry face sheet for immediately available ER MD    Location [facility]-Cardiac & Pulmonary Rehab    Staff Present Renita Papa, RN BSN;Joseph [place], RCP,RRT,BSRT;Kelly Remlap, BS, ACSM CEP, Exercise Physiologist;Kelly Rosalia Hammers, MPA, RN    Virtual Visit No    Medication changes reported     No    Fall or balance concerns reported    No    Warm-up and Cool-down Performed on first and last piece of equipment    Resistance Training Performed Yes    VAD Patient? No    PAD/SET Patient? No      Pain Assessment   Currently in Pain? No/denies                Social History   Tobacco Use  Smoking Status Former  Smokeless Tobacco Never  Tobacco Comments   quit 50 years ago    Goals Met:  Independence with exercise equipment Exercise tolerated well No report of concerns or symptoms today Strength training completed today  Goals Unmet:  Not Applicable  Comments: Pt able to follow exercise prescription today without complaint.  Will continue to monitor for progression.    Dr. Emily Filbert is Medical Director for [place].  Dr. Ottie Glazier is Medical Director for [facility] Pulmonary Rehabilitation.

## 2022-01-04 ENCOUNTER — Other Ambulatory Visit: Payer: Self-pay

## 2022-01-04 ENCOUNTER — Encounter: Payer: Self-pay | Admitting: *Deleted

## 2022-01-04 DIAGNOSIS — G4733 Obstructive sleep apnea (adult) (pediatric): Secondary | ICD-10-CM | POA: Diagnosis not present

## 2022-01-04 NOTE — Progress Notes (Signed)
Daily Session Note  Patient Details  Name: Andrew Callahan MRN: [id number] Date of Birth: 05/06/1934 Referring Provider:   Flowsheet Row Pulmonary Rehab from 12/26/2021 in [facility] Cardiac and Pulmonary Rehab  Referring Provider Lanney Gins       Encounter Date: 01/04/2022  Check In:  Session Check In - 01/04/22 1030       Check-In   Supervising physician immediately available to respond to emergencies See telemetry face sheet for immediately available ER MD    Location [facility]-Cardiac & Pulmonary Rehab    Staff Present Birdie Sons, MPA, RN;Jessica [place], MA, RCEP, CCRP, CCET;Joseph [place], Cristopher Estimable, RN BSN    Virtual Visit No    Medication changes reported     No    Fall or balance concerns reported    No    Warm-up and Cool-down Performed on first and last piece of equipment    Resistance Training Performed Yes    VAD Patient? No    PAD/SET Patient? No      Pain Assessment   Currently in Pain? No/denies                Social History   Tobacco Use  Smoking Status Former  Smokeless Tobacco Never  Tobacco Comments   quit 50 years ago    Goals Met:  Independence with exercise equipment Exercise tolerated well No report of concerns or symptoms today Strength training completed today  Goals Unmet:  Not Applicable  Comments: Pt able to follow exercise prescription today without complaint.  Will continue to monitor for progression.    Dr. Emily Filbert is Medical Director for [place].  Dr. Ottie Glazier is Medical Director for [facility] Pulmonary Rehabilitation.

## 2022-01-04 NOTE — Progress Notes (Signed)
Pulmonary Individual Treatment Plan  Patient Details  Name: Andrew Callahan MRN: [id number] Date of Birth: 03/01/1934 Referring Provider:   Flowsheet Row Pulmonary Rehab from 12/26/2021 in [facility] Cardiac and Pulmonary Rehab  Referring Provider Lanney Gins       Initial Encounter Date:  Flowsheet Row Pulmonary Rehab from 12/26/2021 in [facility] Cardiac and Pulmonary Rehab  Date 12/26/21       Visit Diagnosis: OSA (obstructive sleep apnea)  Patient's Home Medications on Admission:  Current Outpatient Medications:    abiraterone acetate (ZYTIGA) 250 MG tablet, TAKE 3 TABLETS (750 MG TOTAL) BY MOUTH DAILY. TAKE ON AN EMPTY STOMACH 1 HOUR BEFORE OR 2 HOURS AFTER A MEAL, Disp: 90 tablet, Rfl: 0   ACCU-CHEK GUIDE test strip, daily. use as directed, Disp: , Rfl:    Accu-Chek Softclix Lancets lancets, daily., Disp: , Rfl:    acyclovir ointment (ZOVIRAX) 5 %, Apply 1 application topically every 3 (three) hours. Apply to aa groin prn flares q 3 hours until resolved, Disp: 30 g, Rfl: 11   B Complex-C (SUPER B COMPLEX PO), Take 1 capsule by mouth daily. , Disp: , Rfl:    Blood Glucose Monitoring Suppl (ACCU-CHEK GUIDE) w/Device KIT, See admin instructions., Disp: , Rfl:    Calcium-Magnesium-Vitamin D (CALCIUM 1200+D3 PO), Take 2 tablets by mouth in the morning and at bedtime., Disp: , Rfl:    cetirizine (ZYRTEC) 10 MG tablet, Take 10 mg by mouth daily as needed for allergies., Disp: , Rfl:    denosumab (XGEVA) 120 MG/1.7ML SOLN injection, Inject 120 mg into the skin every 3 (three) months., Disp: , Rfl:    ELIQUIS 5 MG TABS tablet, Take 5 mg by mouth 2 (two) times daily., Disp: , Rfl:    glipiZIDE (GLUCOTROL XL) 10 MG 24 hr tablet, Take 5 mg by mouth daily., Disp: , Rfl:    hydrocortisone (CORTEF) 10 MG tablet, Take 10 mg by mouth 2 (two) times daily. , Disp: , Rfl:    Iron, Ferrous Sulfate, 325 (65 Fe) MG TABS, daily., Disp: , Rfl:    Leuprolide Acetate (ELIGARD [place]), Inject into the skin. 1 injection  every 6 months., Disp: , Rfl:    levothyroxine (SYNTHROID) 50 MCG tablet, Take 50 mcg by mouth daily before breakfast., Disp: , Rfl:    melatonin 5 MG TABS, Take 10 mg by mouth at bedtime., Disp: , Rfl:    Misc Natural Products (OSTEO BI-FLEX ADV JOINT SHIELD PO), Take 1 tablet by mouth daily. , Disp: , Rfl:    Multiple Vitamin (MULTIVITAMIN WITH MINERALS) TABS tablet, Take 1 tablet by mouth daily. One-A-Day Men's 50+, Disp: , Rfl:    polyethylene glycol (MIRALAX / GLYCOLAX) 17 g packet, Take 17 g by mouth daily. W/coffee, Disp: , Rfl:    Probiotic Product (ALIGN) 4 MG CAPS, Take 4 mg by mouth daily. , Disp: , Rfl:    triamcinolone cream (KENALOG) 0.1 %, Apply 1 application topically as directed. Qd to bid up to 5 days per week to right hand until clear, then prn flares, avoid face, groin, axilla, Disp: 80 g, Rfl: 1   Trospium Chloride 60 MG CP24, Take 1 capsule (60 mg total) by mouth daily. (Patient not taking: Reported on 11/18/2021), Disp: 30 capsule, Rfl: 11   valsartan (DIOVAN) 320 MG tablet, Take 160 mg by mouth 2 (two) times daily., Disp: , Rfl:   Past Medical History: Past Medical History:  Diagnosis Date   Acquired hypothyroidism    Aortic atherosclerosis ([place])  Atrial fibrillation (HCC)    Chronic constipation    CVA (cerebral vascular accident) ([place])    DDD (degenerative disc disease), cervical    Diabetes mellitus without complication ([place])    Diverticulosis    Elevated prostate specific antigen (PSA)    Family history of leukemia    Family history of lung cancer    History of kidney stones    History of pituitary adenoma    Hyperlipidemia    Hypertension    Hypogonadism in male    Hypothyroid    Hypothyroidism    IDA (iron deficiency anemia)    Lumbar disc disease    Nephrolithiasis    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 Mohs.   Stroke ([place])    throat / slight difficulty swallowing    Tobacco Use: Social History   Tobacco Use  Smoking Status Former  Smokeless Tobacco Never  Tobacco Comments   quit 50 years ago    Labs: Recent Review Scientist, physiological     Labs for ITP Cardiac and Pulmonary Rehab Latest Ref Rng & Units 11/18/2021   Hemoglobin A1c 4.8 - 5.6 % 5.9(H)        Pulmonary Assessment Scores:  Pulmonary Assessment Scores     Row Name 12/26/21 1604 12/28/21 1119       ADL UCSD   ADL Phase -- Entry    SOB Score total -- 19    Rest -- 0    Walk -- 0    Stairs -- 3    Bath -- 0    Dress -- 0    Shop -- 1      CAT Score   CAT Score 18 18      mMRC Score   mMRC Score 1 1             UCSD: Self-administered rating of dyspnea associated with activities of daily living (ADLs) 6-point scale (0 = "not at all" to 5 = "maximal or unable to do because of breathlessness")  Scoring Scores range from 0 to 120.  Minimally important difference is 5 units  CAT: CAT can identify the health impairment of COPD patients and is better correlated with disease progression.  CAT has a scoring range of zero to 40. The CAT score is classified into four groups of low (less than 10), medium (10 - 20), high (21-30) and very high (31-40) based on the impact level of disease on health status. A CAT score over 10 suggests significant symptoms.  A worsening CAT score could be explained by an exacerbation, poor medication adherence, poor inhaler technique, or progression of COPD or comorbid conditions.  CAT MCID is 2 points  mMRC: mMRC (Modified Medical Research Council) Dyspnea Scale is used to assess the degree of baseline functional disability in patients of respiratory disease due to dyspnea. No minimal important difference is established. A decrease in score of 1 point or greater is considered a positive change.   Pulmonary Function  Assessment:   Exercise Target Goals: Exercise Program Goal: Individual exercise prescription set using results from initial 6 min walk test and THRR while considering  patients activity barriers and safety.   Exercise Prescription Goal: Initial exercise prescription builds to 30-45 minutes a day of aerobic activity,  2-3 days per week.  Home exercise guidelines will be given to patient during program as part of exercise prescription that the participant will acknowledge.  Education: Aerobic Exercise: - Group verbal and visual presentation on the components of exercise prescription. Introduces F.I.T.T principle from ACSM for exercise prescriptions.  Reviews F.I.T.T. principles of aerobic exercise including progression. Written material given at graduation.   Education: Resistance Exercise: - Group verbal and visual presentation on the components of exercise prescription. Introduces F.I.T.T principle from ACSM for exercise prescriptions  Reviews F.I.T.T. principles of resistance exercise including progression. Written material given at graduation.    Education: Exercise & Equipment Safety: - Individual verbal instruction and demonstration of equipment use and safety with use of the equipment. Flowsheet Row Pulmonary Rehab from 12/28/2021 in [facility] Cardiac and Pulmonary Rehab  Date 12/26/21  Educator [facility]  Instruction Review Code 1- Verbalizes Understanding       Education: Exercise Physiology & General Exercise Guidelines: - Group verbal and written instruction with models to review the exercise physiology of the cardiovascular system and associated critical values. Provides general exercise guidelines with specific guidelines to those with heart or lung disease.    Education: Flexibility, Balance, Mind/Body Relaxation: - Group verbal and visual presentation with interactive activity on the components of exercise prescription. Introduces F.I.T.T principle from ACSM for exercise  prescriptions. Reviews F.I.T.T. principles of flexibility and balance exercise training including progression. Also discusses the mind body connection.  Reviews various relaxation techniques to help reduce and manage stress (i.e. Deep breathing, progressive muscle relaxation, and visualization). Balance handout provided to take home. Written material given at graduation.   Activity Barriers & Risk Stratification:  Activity Barriers & Cardiac Risk Stratification - 11/14/21 1402       Activity Barriers & Cardiac Risk Stratification   Activity Barriers Balance Concerns;Muscular Weakness   history of stroke; metastatic prostate cancer (overactive bladder)            6 Minute Walk:  6 Minute Walk     Row Name 12/26/21 1550         6 Minute Walk   Phase Initial     Distance 540 feet     Walk Time 6 minutes     # of Rest Breaks 0     MPH 1.02     METS 0.87     RPE 13     Perceived Dyspnea  0     Symptoms No     Resting HR 60 bpm     Resting BP 170/90     Resting Oxygen Saturation  96 %     Exercise Oxygen Saturation  during 6 min walk 96 %     Max Ex. HR 101 bpm     Max Ex. BP 190/80     2 Minute Post BP 156/82       Interval HR   1 Minute HR 83     2 Minute HR 93     3 Minute HR 87     4 Minute HR 87     5 Minute HR 85     6 Minute HR 101     2 Minute Post HR 69     Interval Heart Rate? Yes       Interval Oxygen   Interval Oxygen? Yes     Baseline Oxygen Saturation % 96 %     1 Minute Oxygen Saturation % 96 %     1 Minute Liters of Oxygen  0 L     2 Minute Oxygen Saturation % 96 %     2 Minute Liters of Oxygen 0 L     3 Minute Oxygen Saturation % 96 %     3 Minute Liters of Oxygen 0 L     4 Minute Oxygen Saturation % 96 %     4 Minute Liters of Oxygen 0 L     5 Minute Oxygen Saturation % 96 %     5 Minute Liters of Oxygen 0 L     6 Minute Oxygen Saturation % 96 %     6 Minute Liters of Oxygen 0 L     2 Minute Post Oxygen Saturation % 98 %     2 Minute Post  Liters of Oxygen 0 L             Oxygen Initial Assessment:  Oxygen Initial Assessment - 11/14/21 1405       Home Oxygen   Home Oxygen Device None    Sleep Oxygen Prescription CPAP    Compliance with Home Oxygen Use Yes      Initial 6 min Walk   Oxygen Used None      Program Oxygen Prescription   Program Oxygen Prescription None      Intervention   Short Term Goals To learn and understand importance of monitoring SPO2 with pulse oximeter and demonstrate accurate use of the pulse oximeter.;To learn and understand importance of maintaining oxygen saturations>88%;To learn and demonstrate proper pursed lip breathing techniques or other breathing techniques. ;To learn and demonstrate proper use of respiratory medications    Long  Term Goals Verbalizes importance of monitoring SPO2 with pulse oximeter and return demonstration;Maintenance of O2 saturations>88%;Compliance with respiratory medication;Exhibits proper breathing techniques, such as pursed lip breathing or other method taught during program session;Demonstrates proper use of MDIs             Oxygen Re-Evaluation:  Oxygen Re-Evaluation     Row Name 12/28/21 1029             Program Oxygen Prescription   Program Oxygen Prescription None         Home Oxygen   Home Oxygen Device None       Sleep Oxygen Prescription CPAP       Compliance with Home Oxygen Use Yes         Goals/Expected Outcomes   Short Term Goals To learn and understand importance of monitoring SPO2 with pulse oximeter and demonstrate accurate use of the pulse oximeter.;To learn and understand importance of maintaining oxygen saturations>88%;To learn and demonstrate proper pursed lip breathing techniques or other breathing techniques.        Long  Term Goals Verbalizes importance of monitoring SPO2 with pulse oximeter and return demonstration;Maintenance of O2 saturations>88%;Compliance with respiratory medication;Exhibits proper breathing  techniques, such as pursed lip breathing or other method taught during program session       Comments Reviewed PLB technique with pt.  Talked about how it works and it's importance in maintaining their exercise saturations.       Goals/Expected Outcomes Short: Become more profiecient at using PLB.   Long: Become independent at using PLB.                Oxygen Discharge (Final Oxygen Re-Evaluation):  Oxygen Re-Evaluation - 12/28/21 1029       Program Oxygen Prescription   Program Oxygen Prescription None      Home Oxygen  Home Oxygen Device None    Sleep Oxygen Prescription CPAP    Compliance with Home Oxygen Use Yes      Goals/Expected Outcomes   Short Term Goals To learn and understand importance of monitoring SPO2 with pulse oximeter and demonstrate accurate use of the pulse oximeter.;To learn and understand importance of maintaining oxygen saturations>88%;To learn and demonstrate proper pursed lip breathing techniques or other breathing techniques.     Long  Term Goals Verbalizes importance of monitoring SPO2 with pulse oximeter and return demonstration;Maintenance of O2 saturations>88%;Compliance with respiratory medication;Exhibits proper breathing techniques, such as pursed lip breathing or other method taught during program session    Comments Reviewed PLB technique with pt.  Talked about how it works and it's importance in maintaining their exercise saturations.    Goals/Expected Outcomes Short: Become more profiecient at using PLB.   Long: Become independent at using PLB.             Initial Exercise Prescription:  Initial Exercise Prescription - 12/26/21 1500       Date of Initial Exercise RX and Referring Provider   Date 12/26/21    Referring Provider Aleskerov      Oxygen   Maintain Oxygen Saturation 88% or higher      Treadmill   MPH 0.8    Grade 0    Minutes 15    METs 1      Recumbant Bike   Level 1    RPM 60    Minutes 15    METs 1      NuStep    Level 1    SPM 80    Minutes 15    METs 1      Prescription Details   Frequency (times per week) 2    Duration Progress to 30 minutes of continuous aerobic without signs/symptoms of physical distress      Intensity   THRR 40-80% of Max Heartrate 90-120    Ratings of Perceived Exertion 11-13    Perceived Dyspnea 0-4      Progression   Progression Continue to progress workloads to maintain intensity without signs/symptoms of physical distress.      Resistance Training   Training Prescription Yes    Weight 4.0    Reps 10-15             Perform Capillary Blood Glucose checks as needed.  Exercise Prescription Changes:   Exercise Prescription Changes     Row Name 12/26/21 1500             Response to Exercise   Blood Pressure (Admit) 170/90       Blood Pressure (Exercise) 190/80       Blood Pressure (Exit) 140/80       Heart Rate (Admit) 60 bpm       Heart Rate (Exercise) 101 bpm       Heart Rate (Exit) 69 bpm       Oxygen Saturation (Admit) 96 %       Oxygen Saturation (Exercise) 96 %       Oxygen Saturation (Exit) 98 %       Rating of Perceived Exertion (Exercise) 13       Perceived Dyspnea (Exercise) 0       Symptoms none       Comments 6 MWT results                Exercise Comments:   Exercise Comments  [place] Name 12/28/21 1029           Exercise Comments First full day of exercise!  Patient was oriented to gym and equipment including functions, settings, policies, and procedures.  Patient's individual exercise prescription and treatment plan were reviewed.  All starting workloads were established based on the results of the 6 minute walk test done at initial orientation visit.  The plan for exercise progression was also introduced and progression will be customized based on patient's performance and goals.                Exercise Goals and Review:   Exercise Goals     Row Name 12/26/21 1554             Exercise Goals    Increase Physical Activity Yes       Intervention Provide advice, education, support and counseling about physical activity/exercise needs.;Develop an individualized exercise prescription for aerobic and resistive training based on initial evaluation findings, risk stratification, comorbidities and participant's personal goals.       Expected Outcomes Short Term: Attend rehab on a regular basis to increase amount of physical activity.;Long Term: Add in home exercise to make exercise part of routine and to increase amount of physical activity.;Long Term: Exercising regularly at least 3-5 days a week.       Increase Strength and Stamina Yes       Intervention Provide advice, education, support and counseling about physical activity/exercise needs.;Develop an individualized exercise prescription for aerobic and resistive training based on initial evaluation findings, risk stratification, comorbidities and participant's personal goals.       Expected Outcomes Short Term: Increase workloads from initial exercise prescription for resistance, speed, and METs.;Short Term: Perform resistance training exercises routinely during rehab and add in resistance training at home;Long Term: Improve cardiorespiratory fitness, muscular endurance and strength as measured by increased METs and functional capacity (6MWT)       Able to understand and use rate of perceived exertion (RPE) scale Yes       Intervention Provide education and explanation on how to use RPE scale       Expected Outcomes Short Term: Able to use RPE daily in rehab to express subjective intensity level;Long Term:  Able to use RPE to guide intensity level when exercising independently       Able to understand and use Dyspnea scale Yes       Intervention Provide education and explanation on how to use Dyspnea scale       Expected Outcomes Short Term: Able to use Dyspnea scale daily in rehab to express subjective sense of shortness of breath during  exertion;Long Term: Able to use Dyspnea scale to guide intensity level when exercising independently       Knowledge and understanding of Target Heart Rate Range (THRR) Yes       Intervention Provide education and explanation of THRR including how the numbers were predicted and where they are located for reference       Expected Outcomes Short Term: Able to state/look up THRR;Long Term: Able to use THRR to govern intensity when exercising independently;Short Term: Able to use daily as guideline for intensity in rehab       Able to check pulse independently Yes       Intervention Provide education and demonstration on how to check pulse in carotid and radial arteries.;Review the importance of being able to check your own pulse for safety during independent exercise  Expected Outcomes Short Term: Able to explain why pulse checking is important during independent exercise;Long Term: Able to check pulse independently and accurately       Understanding of Exercise Prescription Yes       Intervention Provide education, explanation, and written materials on patient's individual exercise prescription       Expected Outcomes Short Term: Able to explain program exercise prescription;Long Term: Able to explain home exercise prescription to exercise independently                Exercise Goals Re-Evaluation :  Exercise Goals Re-Evaluation     Row Name 12/28/21 1029             Exercise Goal Re-Evaluation   Exercise Goals Review Increase Physical Activity;Able to understand and use rate of perceived exertion (RPE) scale;Knowledge and understanding of Target Heart Rate Range (THRR);Understanding of Exercise Prescription;Increase Strength and Stamina;Able to understand and use Dyspnea scale;Able to check pulse independently       Comments Reviewed RPE and dyspnea scales, THR and program prescription with pt today.  Pt voiced understanding and was given a copy of goals to take home.       Expected  Outcomes Short: Use RPE daily to regulate intensity. Long: Follow program prescription in THR.                Discharge Exercise Prescription (Final Exercise Prescription Changes):  Exercise Prescription Changes - 12/26/21 1500       Response to Exercise   Blood Pressure (Admit) 170/90    Blood Pressure (Exercise) 190/80    Blood Pressure (Exit) 140/80    Heart Rate (Admit) 60 bpm    Heart Rate (Exercise) 101 bpm    Heart Rate (Exit) 69 bpm    Oxygen Saturation (Admit) 96 %    Oxygen Saturation (Exercise) 96 %    Oxygen Saturation (Exit) 98 %    Rating of Perceived Exertion (Exercise) 13    Perceived Dyspnea (Exercise) 0    Symptoms none    Comments 6 MWT results             Nutrition:  Target Goals: Understanding of nutrition guidelines, daily intake of sodium <1555m, cholesterol <203m calories 30% from fat and 7% or less from saturated fats, daily to have 5 or more servings of fruits and vegetables.  Education: All About Nutrition: -Group instruction provided by verbal, written material, interactive activities, discussions, models, and posters to present general guidelines for heart healthy nutrition including fat, fiber, MyPlate, the role of sodium in heart healthy nutrition, utilization of the nutrition label, and utilization of this knowledge for meal planning. Follow up email sent as well. Written material given at graduation.   Biometrics:  Pre Biometrics - 12/26/21 1554       Pre Biometrics   Height 5' 10.5" (1.791 m)    Weight 238 lb 4.8 oz (108.1 kg)    BMI (Calculated) 33.7              Nutrition Therapy Plan and Nutrition Goals:  Nutrition Therapy & Goals - 12/26/21 1602       Intervention Plan   Intervention Prescribe, educate and counsel regarding individualized specific dietary modifications aiming towards targeted core components such as weight, hypertension, lipid management, diabetes, heart failure and other comorbidities.    Expected  Outcomes Short Term Goal: Understand basic principles of dietary content, such as calories, fat, sodium, cholesterol and nutrients.;Short Term Goal: A plan has been developed  with personal nutrition goals set during dietitian appointment.;Long Term Goal: Adherence to prescribed nutrition plan.             Nutrition Assessments:  MEDIFICTS Score Key: ?70 Need to make dietary changes  40-70 Heart Healthy Diet ? 40 Therapeutic Level Cholesterol Diet  Flowsheet Row Pulmonary Rehab from 12/26/2021 in [facility] Cardiac and Pulmonary Rehab  Picture Your Plate Total Score on Admission 52      Picture Your Plate Scores: <40 Unhealthy dietary pattern with much room for improvement. 41-50 Dietary pattern unlikely to meet recommendations for good health and room for improvement. 51-60 More healthful dietary pattern, with some room for improvement.  >60 Healthy dietary pattern, although there may be some specific behaviors that could be improved.   Nutrition Goals Re-Evaluation:   Nutrition Goals Discharge (Final Nutrition Goals Re-Evaluation):   Psychosocial: Target Goals: Acknowledge presence or absence of significant depression and/or stress, maximize coping skills, provide positive support system. Participant is able to verbalize types and ability to use techniques and skills needed for reducing stress and depression.   Education: Stress, Anxiety, and Depression - Group verbal and visual presentation to define topics covered.  Reviews how body is impacted by stress, anxiety, and depression.  Also discusses healthy ways to reduce stress and to treat/manage anxiety and depression.  Written material given at graduation.   Education: Sleep Hygiene -Provides group verbal and written instruction about how sleep can affect your health.  Define sleep hygiene, discuss sleep cycles and impact of sleep habits. Review good sleep hygiene tips.    Initial Review & Psychosocial Screening:  Initial  Psych Review & Screening - 11/14/21 1422       Initial Review   Current issues with Current Sleep Concerns;Current Stress Concerns    Source of Stress Concerns Chronic Illness      Family Dynamics   Good Support System? Yes   wife     Barriers   Psychosocial barriers to participate in program There are no identifiable barriers or psychosocial needs.;The patient should benefit from training in stress management and relaxation.      Screening Interventions   Interventions Encouraged to exercise;Provide feedback about the scores to participant;To provide support and resources with identified psychosocial needs    Expected Outcomes Short Term goal: Utilizing psychosocial counselor, staff and physician to assist with identification of specific Stressors or current issues interfering with healing process. Setting desired goal for each stressor or current issue identified.;Long Term Goal: Stressors or current issues are controlled or eliminated.;Short Term goal: Identification and review with participant of any Quality of Life or Depression concerns found by scoring the questionnaire.;Long Term goal: The participant improves quality of Life and PHQ9 Scores as seen by post scores and/or verbalization of changes             Quality of Life Scores:  Scores of 19 and below usually indicate a poorer quality of life in these areas.  A difference of  2-3 points is a clinically meaningful difference.  A difference of 2-3 points in the total score of the Quality of Life Index has been associated with significant improvement in overall quality of life, self-image, physical symptoms, and general health in studies assessing change in quality of life.  PHQ-9: Recent Review Flowsheet Data     Depression screen [facility] 2/9 12/26/2021   Decreased Interest 0   Down, Depressed, Hopeless 1   PHQ - 2 Score 1   Altered sleeping 3   Tired,  decreased energy 3   Change in appetite 0   Feeling bad or failure about  yourself  0   Trouble concentrating 0   Moving slowly or fidgety/restless 1   Suicidal thoughts 0   PHQ-9 Score 8   Difficult doing work/chores Somewhat difficult      Interpretation of Total Score  Total Score Depression Severity:  1-4 = Minimal depression, 5-9 = Mild depression, 10-14 = Moderate depression, 15-19 = Moderately severe depression, 20-27 = Severe depression   Psychosocial Evaluation and Intervention:  Psychosocial Evaluation - 11/14/21 1422       Psychosocial Evaluation & Interventions   Interventions Encouraged to exercise with the program and follow exercise prescription;Stress management education;Relaxation education    Comments Patricio is coming to pulmonary rehab with OSA. He has become deconditioned post stroke, prostate cancer and other medical issues. The stroke left him weak and requires him to use a rollator. His biggest stressor is his frequent urination which impacts his daily life and especially his sleep routine. He is up multiple times a night which leads to excessive daytime sleepiness. He doesn't feel like being active during the day becasue he is so tired. He is in contact with his MD and have been trialing different options to help in this area with little success. His wife is his main support system and caretaker. She and he are both wanting him to become stronger and more able to do things himself, however he is hesitant about progress when it comes to how tired he feels during the day.    Expected Outcomes Short: attend pulmonary rehab for education and exercise. Long: Develop and maintain positive self care habits.    Continue Psychosocial Services  Follow up required by staff             Psychosocial Re-Evaluation:   Psychosocial Discharge (Final Psychosocial Re-Evaluation):   Education: Education Goals: Education classes will be provided on a weekly basis, covering required topics. Participant will state understanding/return demonstration  of topics presented.  Learning Barriers/Preferences:  Learning Barriers/Preferences - 11/14/21 1422       Learning Barriers/Preferences   Learning Barriers None    Learning Preferences Individual Instruction             General Pulmonary Education Topics:  Infection Prevention: - Provides verbal and written material to individual with discussion of infection control including proper hand washing and proper equipment cleaning during exercise session. Flowsheet Row Pulmonary Rehab from 12/28/2021 in [facility] Cardiac and Pulmonary Rehab  Date 12/26/21  Educator [facility]  Instruction Review Code 1- Verbalizes Understanding       Falls Prevention: - Provides verbal and written material to individual with discussion of falls prevention and safety. Flowsheet Row Pulmonary Rehab from 12/28/2021 in [facility] Cardiac and Pulmonary Rehab  Date 12/26/21  Educator [facility]  Instruction Review Code 1- Verbalizes Understanding       Chronic Lung Disease Review: - Group verbal instruction with posters, models, PowerPoint presentations and videos,  to review new updates, new respiratory medications, new advancements in procedures and treatments. Providing information on websites and "800" numbers for continued self-education. Includes information about supplement oxygen, available portable oxygen systems, continuous and intermittent flow rates, oxygen safety, concentrators, and Medicare reimbursement for oxygen. Explanation of Pulmonary Drugs, including class, frequency, complications, importance of spacers, rinsing mouth after steroid MDI's, and proper cleaning methods for nebulizers. Review of basic lung anatomy and physiology related to function, structure, and complications of lung disease. Review of risk  factors. Discussion about methods for diagnosing sleep apnea and types of masks and machines for OSA. Includes a review of the use of types of environmental controls: home humidity, furnaces, filters, dust  mite/pet prevention, HEPA vacuums. Discussion about weather changes, air quality and the benefits of nasal washing. Instruction on Warning signs, infection symptoms, calling MD promptly, preventive modes, and value of vaccinations. Review of effective airway clearance, coughing and/or vibration techniques. Emphasizing that all should Create an Action Plan. Written material given at graduation. Flowsheet Row Pulmonary Rehab from 12/28/2021 in [facility] Cardiac and Pulmonary Rehab  Education need identified 12/26/21       AED/CPR: - Group verbal and written instruction with the use of models to demonstrate the basic use of the AED with the basic ABC's of resuscitation.    Anatomy and Cardiac Procedures: - Group verbal and visual presentation and models provide information about basic cardiac anatomy and function. Reviews the testing methods done to diagnose heart disease and the outcomes of the test results. Describes the treatment choices: Medical Management, Angioplasty, or Coronary Bypass Surgery for treating various heart conditions including Myocardial Infarction, Angina, Valve Disease, and Cardiac Arrhythmias.  Written material given at graduation.   Medication Safety: - Group verbal and visual instruction to review commonly prescribed medications for heart and lung disease. Reviews the medication, class of the drug, and side effects. Includes the steps to properly store meds and maintain the prescription regimen.  Written material given at graduation. Flowsheet Row Pulmonary Rehab from 12/28/2021 in [facility] Cardiac and Pulmonary Rehab  Date 12/28/21  Educator [facility]  Instruction Review Code 1- Verbalizes Understanding       Other: -Provides group and verbal instruction on various topics (see comments)   Knowledge Questionnaire Score:  Knowledge Questionnaire Score - 12/26/21 1602       Knowledge Questionnaire Score   Pre Score 9/18              Core Components/Risk Factors/Patient  Goals at Admission:  Personal Goals and Risk Factors at Admission - 12/26/21 1600       Core Components/Risk Factors/Patient Goals on Admission    Weight Management Yes    Intervention Weight Management: Develop a combined nutrition and exercise program designed to reach desired caloric intake, while maintaining appropriate intake of nutrient and fiber, sodium and fats, and appropriate energy expenditure required for the weight goal.;Weight Management: Provide education and appropriate resources to help participant work on and attain dietary goals.;Weight Management/Obesity: Establish reasonable short term and long term weight goals.;Obesity: Provide education and appropriate resources to help participant work on and attain dietary goals.    Admit Weight 238 lb 4.8 oz (108.1 kg)    Goal Weight: Short Term 233 lb (105.7 kg)    Goal Weight: Long Term 228 lb (103.4 kg)    Expected Outcomes Short Term: Continue to assess and modify interventions until short term weight is achieved;Long Term: Adherence to nutrition and physical activity/exercise program aimed toward attainment of established weight goal;Understanding recommendations for meals to include 15-35% energy as protein, 25-35% energy from fat, 35-60% energy from carbohydrates, less than 268m of dietary cholesterol, 20-35 gm of total fiber daily;Understanding of distribution of calorie intake throughout the day with the consumption of 4-5 meals/snacks;Weight Loss: Understanding of general recommendations for a balanced deficit meal plan, which promotes 1-2 lb weight loss per week and includes a negative energy balance of [phone] kcal/d    Diabetes Yes    Intervention Provide education about signs/symptoms and  action to take for hypo/hyperglycemia.;Provide education about proper nutrition, including hydration, and aerobic/resistive exercise prescription along with prescribed medications to achieve blood glucose in normal ranges: Fasting glucose 65-99  mg/dL    Expected Outcomes Long Term: Attainment of HbA1C < 7%.;Short Term: Participant verbalizes understanding of the signs/symptoms and immediate care of hyper/hypoglycemia, proper foot care and importance of medication, aerobic/resistive exercise and nutrition plan for blood glucose control.    Hypertension Yes    Intervention Provide education on lifestyle modifcations including regular physical activity/exercise, weight management, moderate sodium restriction and increased consumption of fresh fruit, vegetables, and low fat dairy, alcohol moderation, and smoking cessation.;Monitor prescription use compliance.    Expected Outcomes Short Term: Continued assessment and intervention until BP is < 140/25m HG in hypertensive participants. < 130/867mHG in hypertensive participants with diabetes, heart failure or chronic kidney disease.;Long Term: Maintenance of blood pressure at goal levels.    Lipids Yes    Intervention Provide education and support for participant on nutrition & aerobic/resistive exercise along with prescribed medications to achieve LDL <7057mHDL >63m12m  Expected Outcomes Short Term: Participant states understanding of desired cholesterol values and is compliant with medications prescribed. Participant is following exercise prescription and nutrition guidelines.;Long Term: Cholesterol controlled with medications as prescribed, with individualized exercise RX and with personalized nutrition plan. Value goals: LDL < 70mg62mL > 40 mg.             Education:Diabetes - Individual verbal and written instruction to review signs/symptoms of diabetes, desired ranges of glucose level fasting, after meals and with exercise. Acknowledge that pre and post exercise glucose checks will be done for 3 sessions at entry of program.   Know Your Numbers and Heart Failure: - Group verbal and visual instruction to discuss disease risk factors for cardiac and pulmonary disease and treatment  options.  Reviews associated critical values for Overweight/Obesity, Hypertension, Cholesterol, and Diabetes.  Discusses basics of heart failure: signs/symptoms and treatments.  Introduces Heart Failure Zone chart for action plan for heart failure.  Written material given at graduation.   Core Components/Risk Factors/Patient Goals Review:    Core Components/Risk Factors/Patient Goals at Discharge (Final Review):    ITP Comments:  ITP Comments     Row Name 11/14/21 1428 12/26/21 1545 12/28/21 1029 01/04/22 0849     ITP Comments Initial telephone orientation completed. Diagnosis can be found in [facility]. EP orientation scheduled for Wednesday 12/28 at 1:30 pm. Completed 6MWT and gym orientation. Initial ITP created and sent for review to Dr. Faud Zetta Billsical Director. First full day of exercise!  Patient was oriented to gym and equipment including functions, settings, policies, and procedures.  Patient's individual exercise prescription and treatment plan were reviewed.  All starting workloads were established based on the results of the 6 minute walk test done at initial orientation visit.  The plan for exercise progression was also introduced and progression will be customized based on patient's performance and goals. 30 Day review completed. Medical Director ITP review done, changes made as directed, and signed approval by Medical Director.    new to program             Comments:

## 2022-01-09 ENCOUNTER — Encounter: Payer: Medicare Other | Admitting: *Deleted

## 2022-01-09 ENCOUNTER — Other Ambulatory Visit: Payer: Self-pay

## 2022-01-09 DIAGNOSIS — G4733 Obstructive sleep apnea (adult) (pediatric): Secondary | ICD-10-CM

## 2022-01-09 NOTE — Progress Notes (Signed)
Daily Session Note  Patient Details  Name: Andrew Callahan MRN: [id number] Date of Birth: 02-28-1934 Referring Provider:   Flowsheet Row Pulmonary Rehab from 12/26/2021 in [facility] Cardiac and Pulmonary Rehab  Referring Provider Lanney Gins       Encounter Date: 01/09/2022  Check In:  Session Check In - 01/09/22 1105       Check-In   Supervising physician immediately available to respond to emergencies See telemetry face sheet for immediately available ER MD    Location [facility]-Cardiac & Pulmonary Rehab    Staff Present Renita Papa, RN BSN;Joseph Tessie Fass, RCP,RRT,BSRT;Laureen Owens [place], [place], RRT, CPFT;Kelly Rosalia Hammers, MPA, RN    Virtual Visit No    Medication changes reported     No    Fall or balance concerns reported    No    Warm-up and Cool-down Performed on first and last piece of equipment    Resistance Training Performed Yes    VAD Patient? No    PAD/SET Patient? No      Pain Assessment   Currently in Pain? No/denies                Social History   Tobacco Use  Smoking Status Former  Smokeless Tobacco Never  Tobacco Comments   quit 50 years ago    Goals Met:  Independence with exercise equipment Exercise tolerated well No report of concerns or symptoms today Strength training completed today  Goals Unmet:  Not Applicable  Comments: Pt able to follow exercise prescription today without complaint.  Will continue to monitor for progression.    Dr. Emily Filbert is Medical Director for [place].  Dr. Ottie Glazier is Medical Director for [facility] Pulmonary Rehabilitation.

## 2022-01-11 ENCOUNTER — Other Ambulatory Visit: Payer: Self-pay

## 2022-01-11 ENCOUNTER — Encounter: Payer: Medicare Other | Attending: Pulmonary Disease

## 2022-01-11 DIAGNOSIS — G4730 Sleep apnea, unspecified: Secondary | ICD-10-CM | POA: Insufficient documentation

## 2022-01-11 DIAGNOSIS — G4733 Obstructive sleep apnea (adult) (pediatric): Secondary | ICD-10-CM

## 2022-01-11 LAB — GLUCOSE, CAPILLARY: Glucose-Capillary: 144 mg/dL — ABNORMAL HIGH (ref 70–99)

## 2022-01-11 NOTE — Progress Notes (Signed)
Daily Session Note  Patient Details  Name: Andrew Callahan MRN: [id number] Date of Birth: 27-Jun-1934 Referring Provider:   Flowsheet Row Pulmonary Rehab from 12/26/2021 in [facility] Cardiac and Pulmonary Rehab  Referring Provider Lanney Gins       Encounter Date: 01/11/2022  Check In:  Session Check In - 01/11/22 1046       Check-In   Supervising physician immediately available to respond to emergencies See telemetry face sheet for immediately available ER MD    Location [facility]-Cardiac & Pulmonary Rehab    Staff Present Birdie Sons, MPA, Elveria Rising, BA, ACSM CEP, Exercise Physiologist;Joseph Tessie Fass, [place]    Virtual Visit No    Medication changes reported     No    Fall or balance concerns reported    No    Warm-up and Cool-down Performed on first and last piece of equipment    Resistance Training Performed Yes    VAD Patient? No    PAD/SET Patient? No      Pain Assessment   Currently in Pain? No/denies                Social History   Tobacco Use  Smoking Status Former  Smokeless Tobacco Never  Tobacco Comments   quit 50 years ago    Goals Met:  Independence with exercise equipment Exercise tolerated well No report of concerns or symptoms today Strength training completed today  Goals Unmet:  Not Applicable  Comments: Pt able to follow exercise prescription today without complaint.  Will continue to monitor for progression.    Dr. Emily Filbert is Medical Director for [place].  Dr. Ottie Glazier is Medical Director for [facility] Pulmonary Rehabilitation.

## 2022-01-16 ENCOUNTER — Other Ambulatory Visit: Payer: Self-pay

## 2022-01-16 ENCOUNTER — Encounter: Payer: Medicare Other | Admitting: *Deleted

## 2022-01-16 ENCOUNTER — Ambulatory Visit: Payer: Medicare Other | Admitting: Dermatology

## 2022-01-16 DIAGNOSIS — G4733 Obstructive sleep apnea (adult) (pediatric): Secondary | ICD-10-CM

## 2022-01-16 DIAGNOSIS — G4730 Sleep apnea, unspecified: Secondary | ICD-10-CM | POA: Diagnosis not present

## 2022-01-16 NOTE — Progress Notes (Signed)
Daily Session Note  Patient Details  Name: Shaheed Schmuck MRN: [id number] Date of Birth: 1934/07/05 Referring Provider:   Flowsheet Row Pulmonary Rehab from 12/26/20[facility] in [facility] Cardiac and Pulmonary Rehab  Referring Provider Lanney Gins       Encounter Date: 01/16/2022  Check In:  Session Check In - 01/16/22 1054       Check-In   Supervising physician immediately available to respond to emergencies See telemetry face sheet for immediately available ER MD    Location [facility]-Cardiac & Pulmonary Rehab    Staff Present Renita Papa, RN Moises Blood, BS, ACSM CEP, Exercise Physiologist;Amanda Oletta Darter, BA, ACSM CEP, Exercise Physiologist;Kelly Rosalia Hammers, MPA, RN    Virtual Visit No    Medication changes reported     No    Fall or balance concerns reported    No    Warm-up and Cool-down Performed on first and last piece of equipment    Resistance Training Performed Yes    VAD Patient? No    PAD/SET Patient? No      Pain Assessment   Currently in Pain? No/denies                Social History   Tobacco Use  Smoking Status Former  Smokeless Tobacco Never  Tobacco Comments   quit 50 years ago    Goals Met:  Independence with exercise equipment Exercise tolerated well No report of concerns or symptoms today Strength training completed today  Goals Unmet:  Not Applicable  Comments: Pt able to follow exercise prescription today without complaint.  Will continue to monitor for progression.    Dr. Emily Filbert is Medical Director for [place].  Dr. Ottie Glazier is Medical Director for [facility] Pulmonary Rehabilitation.

## 2022-01-18 ENCOUNTER — Other Ambulatory Visit: Payer: Self-pay

## 2022-01-18 DIAGNOSIS — G4730 Sleep apnea, unspecified: Secondary | ICD-10-CM | POA: Diagnosis not present

## 2022-01-18 DIAGNOSIS — G4733 Obstructive sleep apnea (adult) (pediatric): Secondary | ICD-10-CM

## 2022-01-18 NOTE — Progress Notes (Signed)
Daily Session Note  Patient Details  Name: Andrew Callahan MRN: [id number] Date of Birth: 1934/07/11 Referring Provider:   Flowsheet Row Pulmonary Rehab from 12/26/2021 in [facility] Cardiac and Pulmonary Rehab  Referring Provider Lanney Gins       Encounter Date: 01/18/2022  Check In:  Session Check In - 01/18/22 1044       Check-In   Supervising physician immediately available to respond to emergencies See telemetry face sheet for immediately available ER MD    Location [facility]-Cardiac & Pulmonary Rehab    Staff Present Birdie Sons, MPA, RN;Melissa Greenfield, RDN, LDN;Meredith Sherryll Burger, RN Vickki Hearing, BA, ACSM CEP, Exercise Physiologist    Virtual Visit No    Medication changes reported     No    Fall or balance concerns reported    No    Warm-up and Cool-down Performed on first and last piece of equipment    Resistance Training Performed Yes    VAD Patient? No    PAD/SET Patient? No      Pain Assessment   Currently in Pain? No/denies                Social History   Tobacco Use  Smoking Status Former  Smokeless Tobacco Never  Tobacco Comments   quit 50 years ago    Goals Met:  Independence with exercise equipment Exercise tolerated well No report of concerns or symptoms today Strength training completed today  Goals Unmet:  Not Applicable  Comments: Pt able to follow exercise prescription today without complaint.  Will continue to monitor for progression.    Dr. Emily Filbert is Medical Director for [place].  Dr. Ottie Glazier is Medical Director for [facility] Pulmonary Rehabilitation.

## 2022-01-23 ENCOUNTER — Encounter: Payer: Medicare Other | Admitting: *Deleted

## 2022-01-23 ENCOUNTER — Other Ambulatory Visit: Payer: Self-pay

## 2022-01-23 DIAGNOSIS — G4733 Obstructive sleep apnea (adult) (pediatric): Secondary | ICD-10-CM

## 2022-01-23 DIAGNOSIS — G4730 Sleep apnea, unspecified: Secondary | ICD-10-CM | POA: Diagnosis not present

## 2022-01-23 NOTE — Progress Notes (Signed)
Daily Session Note  Patient Details  Name: Andrew Callahan MRN: [id number] Date of Birth: 10/25/1934 Referring Provider:   Flowsheet Row Pulmonary Rehab from 12/26/2021 in [facility] Cardiac and Pulmonary Rehab  Referring Provider Lanney Gins       Encounter Date: 01/23/2022  Check In:  Session Check In - 01/23/22 1120       Check-In   Supervising physician immediately available to respond to emergencies See telemetry face sheet for immediately available ER MD    Location [facility]-Cardiac & Pulmonary Rehab    Staff Present Renita Papa, RN Moises Blood, BS, ACSM CEP, Exercise Physiologist;Amanda Oletta Darter, BA, ACSM CEP, Exercise Physiologist;Kelly Rosalia Hammers, MPA, RN    Virtual Visit No    Medication changes reported     No    Fall or balance concerns reported    No    Warm-up and Cool-down Performed on first and last piece of equipment    Resistance Training Performed Yes    VAD Patient? No    PAD/SET Patient? No      Pain Assessment   Currently in Pain? No/denies                Social History   Tobacco Use  Smoking Status Former  Smokeless Tobacco Never  Tobacco Comments   quit 50 years ago    Goals Met:  Independence with exercise equipment Exercise tolerated well No report of concerns or symptoms today Strength training completed today  Goals Unmet:  Not Applicable  Comments: Pt able to follow exercise prescription today without complaint.  Will continue to monitor for progression.    Dr. Emily Filbert is Medical Director for [place].  Dr. Ottie Glazier is Medical Director for [facility] Pulmonary Rehabilitation.

## 2022-01-25 ENCOUNTER — Other Ambulatory Visit: Payer: Self-pay

## 2022-01-25 DIAGNOSIS — G4733 Obstructive sleep apnea (adult) (pediatric): Secondary | ICD-10-CM

## 2022-01-25 DIAGNOSIS — G4730 Sleep apnea, unspecified: Secondary | ICD-10-CM | POA: Diagnosis not present

## 2022-01-25 NOTE — Progress Notes (Signed)
Daily Session Note  Patient Details  Name: Mcadoo Muzquiz MRN: [id number] Date of Birth: 13-Sep-1934 Referring Provider:   Flowsheet Row Pulmonary Rehab from 12/26/2021 in [facility] Cardiac and Pulmonary Rehab  Referring Provider Lanney Gins       Encounter Date: 01/25/2022  Check In:  Session Check In - 01/25/22 1037       Check-In   Supervising physician immediately available to respond to emergencies See telemetry face sheet for immediately available ER MD    Location [facility]-Cardiac & Pulmonary Rehab    Staff Present Birdie Sons, MPA, RN;Melissa [place], RDN, Rowe Pavy, BA, ACSM CEP, Exercise Physiologist;Joseph [place], [place]    Virtual Visit No    Medication changes reported     No    Fall or balance concerns reported    No    Warm-up and Cool-down Performed on first and last piece of equipment    Resistance Training Performed Yes    VAD Patient? No    PAD/SET Patient? No      Pain Assessment   Currently in Pain? No/denies                Social History   Tobacco Use  Smoking Status Former  Smokeless Tobacco Never  Tobacco Comments   quit 50 years ago    Goals Met:  Independence with exercise equipment Exercise tolerated well No report of concerns or symptoms today Strength training completed today  Goals Unmet:  Not Applicable  Comments: Pt able to follow exercise prescription today without complaint.  Will continue to monitor for progression.    Dr. Emily Filbert is Medical Director for [place].  Dr. Ottie Glazier is Medical Director for [facility] Pulmonary Rehabilitation.

## 2022-01-30 ENCOUNTER — Encounter: Payer: Medicare Other | Admitting: *Deleted

## 2022-01-30 ENCOUNTER — Other Ambulatory Visit: Payer: Self-pay

## 2022-01-30 DIAGNOSIS — G4730 Sleep apnea, unspecified: Secondary | ICD-10-CM | POA: Diagnosis not present

## 2022-01-30 DIAGNOSIS — G4733 Obstructive sleep apnea (adult) (pediatric): Secondary | ICD-10-CM

## 2022-01-30 NOTE — Progress Notes (Signed)
Daily Session Note  Patient Details  Name: Andrew Callahan MRN: [id number] Date of Birth: 10/07/1934 Referring Provider:   Flowsheet Row Pulmonary Rehab from 12/26/2021 in [facility] Cardiac and Pulmonary Rehab  Referring Provider Lanney Gins       Encounter Date: 01/30/2022  Check In:  Session Check In - 01/30/22 1128       Check-In   Supervising physician immediately available to respond to emergencies See telemetry face sheet for immediately available ER MD    Location [facility]-Cardiac & Pulmonary Rehab    Staff Present Renita Papa, RN BSN;Joseph [place], RCP,RRT,BSRT;Kelly [place], [place], ACSM CEP, Exercise Physiologist    Virtual Visit No    Medication changes reported     No    Fall or balance concerns reported    No    Warm-up and Cool-down Performed on first and last piece of equipment    Resistance Training Performed Yes    VAD Patient? No    PAD/SET Patient? No      Pain Assessment   Currently in Pain? No/denies                Social History   Tobacco Use  Smoking Status Former  Smokeless Tobacco Never  Tobacco Comments   quit 50 years ago    Goals Met:  Independence with exercise equipment Exercise tolerated well No report of concerns or symptoms today Strength training completed today  Goals Unmet:  Not Applicable  Comments: Pt able to follow exercise prescription today without complaint.  Will continue to monitor for progression.    Dr. Emily Filbert is Medical Director for [place].  Dr. Ottie Glazier is Medical Director for [facility] Pulmonary Rehabilitation.

## 2022-02-01 ENCOUNTER — Other Ambulatory Visit: Payer: Self-pay

## 2022-02-01 ENCOUNTER — Encounter: Payer: Self-pay | Admitting: *Deleted

## 2022-02-01 DIAGNOSIS — G4730 Sleep apnea, unspecified: Secondary | ICD-10-CM | POA: Diagnosis not present

## 2022-02-01 DIAGNOSIS — G4733 Obstructive sleep apnea (adult) (pediatric): Secondary | ICD-10-CM

## 2022-02-01 NOTE — Progress Notes (Signed)
Daily Session Note  Patient Details  Name: Andrew Callahan MRN: [id number] Date of Birth: 08-12-1934 Referring Provider:   Flowsheet Row Pulmonary Rehab from 12/26/2021 in [facility]  Referring Provider Lanney Gins       Encounter Date: 02/01/2022  Check In:  Session Check In - 02/01/22 1040       Check-In   Supervising physician immediately available to respond to emergencies See telemetry face sheet for immediately available ER MD    Location [facility]-Cardiac & Pulmonary Rehab    Staff Present Birdie Sons, MPA, RN;Joseph Tessie Fass, RCP,RRT,BSRT;Amanda Oletta Darter, [place], ACSM CEP, Exercise Physiologist    Virtual Visit No    Medication changes reported     No    Fall or balance concerns reported    No    Warm-up and Cool-down Performed on first and last piece of equipment    Resistance Training Performed Yes    VAD Patient? No    PAD/SET Patient? No      Pain Assessment   Currently in Pain? No/denies                Social History   Tobacco Use  Smoking Status Former  Smokeless Tobacco Never  Tobacco Comments   quit 50 years ago    Goals Met:  Independence with exercise equipment Exercise tolerated well No report of concerns or symptoms today Strength training completed today  Goals Unmet:  Not Applicable  Comments: Pt able to follow exercise prescription today without complaint.  Will continue to monitor for progression.    Dr. Emily Filbert is Medical Director for [facility].  Dr. Ottie Glazier is Medical Director for [facility] Pulmonary Rehabilitation.

## 2022-02-01 NOTE — Progress Notes (Signed)
Pulmonary Individual Treatment Plan  Patient Details  Name: Andrew Callahan MRN: [id number] Date of Birth: Mar 12, 1934 Referring Provider:   Flowsheet Row Pulmonary Rehab from 12/26/2021 in [facility] Cardiac and Pulmonary Rehab  Referring Provider Lanney Gins       Initial Encounter Date:  Flowsheet Row Pulmonary Rehab from 12/26/2021 in [facility] Cardiac and Pulmonary Rehab  Date 12/26/21       Visit Diagnosis: OSA (obstructive sleep apnea)  Patient's Home Medications on Admission:  Current Outpatient Medications:    abiraterone acetate (ZYTIGA) 250 MG tablet, TAKE 3 TABLETS (750 MG TOTAL) BY MOUTH DAILY. TAKE ON AN EMPTY STOMACH 1 HOUR BEFORE OR 2 HOURS AFTER A MEAL, Disp: 90 tablet, Rfl: 0   ACCU-CHEK GUIDE test strip, daily. use as directed, Disp: , Rfl:    Accu-Chek Softclix Lancets lancets, daily., Disp: , Rfl:    acyclovir ointment (ZOVIRAX) 5 %, Apply 1 application topically every 3 (three) hours. Apply to aa groin prn flares q 3 hours until resolved, Disp: 30 g, Rfl: 11   B Complex-C (SUPER B COMPLEX PO), Take 1 capsule by mouth daily. , Disp: , Rfl:    Blood Glucose Monitoring Suppl (ACCU-CHEK GUIDE) w/Device KIT, See admin instructions., Disp: , Rfl:    Calcium-Magnesium-Vitamin D (CALCIUM 1200+D3 PO), Take 2 tablets by mouth in the morning and at bedtime., Disp: , Rfl:    cetirizine (ZYRTEC) 10 MG tablet, Take 10 mg by mouth daily as needed for allergies., Disp: , Rfl:    denosumab (XGEVA) 120 MG/1.7ML SOLN injection, Inject 120 mg into the skin every 3 (three) months., Disp: , Rfl:    ELIQUIS 5 MG TABS tablet, Take 5 mg by mouth 2 (two) times daily., Disp: , Rfl:    glipiZIDE (GLUCOTROL XL) 10 MG 24 hr tablet, Take 5 mg by mouth daily., Disp: , Rfl:    hydrocortisone (CORTEF) 10 MG tablet, Take 10 mg by mouth 2 (two) times daily. , Disp: , Rfl:    Iron, Ferrous Sulfate, 325 (65 Fe) MG TABS, daily., Disp: , Rfl:    Leuprolide Acetate (ELIGARD [place]), Inject into the skin. 1 injection  every 6 months., Disp: , Rfl:    levothyroxine (SYNTHROID) 50 MCG tablet, Take 50 mcg by mouth daily before breakfast., Disp: , Rfl:    melatonin 5 MG TABS, Take 10 mg by mouth at bedtime., Disp: , Rfl:    Misc Natural Products (OSTEO BI-FLEX ADV JOINT SHIELD PO), Take 1 tablet by mouth daily. , Disp: , Rfl:    Multiple Vitamin (MULTIVITAMIN WITH MINERALS) TABS tablet, Take 1 tablet by mouth daily. One-A-Day Men's 50+, Disp: , Rfl:    polyethylene glycol (MIRALAX / GLYCOLAX) 17 g packet, Take 17 g by mouth daily. W/coffee, Disp: , Rfl:    Probiotic Product (ALIGN) 4 MG CAPS, Take 4 mg by mouth daily. , Disp: , Rfl:    triamcinolone cream (KENALOG) 0.1 %, Apply 1 application topically as directed. Qd to bid up to 5 days per week to right hand until clear, then prn flares, avoid face, groin, axilla, Disp: 80 g, Rfl: 1   Trospium Chloride 60 MG CP24, Take 1 capsule (60 mg total) by mouth daily. (Patient not taking: Reported on 11/18/2021), Disp: 30 capsule, Rfl: 11   valsartan (DIOVAN) 320 MG tablet, Take 160 mg by mouth 2 (two) times daily., Disp: , Rfl:   Past Medical History: Past Medical History:  Diagnosis Date   Acquired hypothyroidism    Aortic atherosclerosis ([place])  Atrial fibrillation (HCC)    Chronic constipation    CVA (cerebral vascular accident) ([place])    DDD (degenerative disc disease), cervical    Diabetes mellitus without complication ([place])    Diverticulosis    Elevated prostate specific antigen (PSA)    Family history of leukemia    Family history of lung cancer    History of kidney stones    History of pituitary adenoma    Hyperlipidemia    Hypertension    Hypogonadism in male    Hypothyroid    Hypothyroidism    IDA (iron deficiency anemia)    Lumbar disc disease    Nephrolithiasis    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 Mohs.   Stroke ([place])    throat / slight difficulty swallowing    Tobacco Use: Social History   Tobacco Use  Smoking Status Former  Smokeless Tobacco Never  Tobacco Comments   quit 50 years ago    Labs: Recent Review Scientist, physiological     Labs for ITP Cardiac and Pulmonary Rehab Latest Ref Rng & Units 11/18/2021   Hemoglobin A1c 4.8 - 5.6 % 5.9(H)        Pulmonary Assessment Scores:  Pulmonary Assessment Scores     Row Name 12/26/21 1604 12/28/21 1119       ADL UCSD   ADL Phase -- Entry    SOB Score total -- 19    Rest -- 0    Walk -- 0    Stairs -- 3    Bath -- 0    Dress -- 0    Shop -- 1      CAT Score   CAT Score 18 18      mMRC Score   mMRC Score 1 1             UCSD: Self-administered rating of dyspnea associated with activities of daily living (ADLs) 6-point scale (0 = "not at all" to 5 = "maximal or unable to do because of breathlessness")  Scoring Scores range from 0 to 120.  Minimally important difference is 5 units  CAT: CAT can identify the health impairment of COPD patients and is better correlated with disease progression.  CAT has a scoring range of zero to 40. The CAT score is classified into four groups of low (less than 10), medium (10 - 20), high (21-30) and very high (31-40) based on the impact level of disease on health status. A CAT score over 10 suggests significant symptoms.  A worsening CAT score could be explained by an exacerbation, poor medication adherence, poor inhaler technique, or progression of COPD or comorbid conditions.  CAT MCID is 2 points  mMRC: mMRC (Modified Medical Research Council) Dyspnea Scale is used to assess the degree of baseline functional disability in patients of respiratory disease due to dyspnea. No minimal important difference is established. A decrease in score of 1 point or greater is considered a positive change.   Pulmonary Function  Assessment:   Exercise Target Goals: Exercise Program Goal: Individual exercise prescription set using results from initial 6 min walk test and THRR while considering  patients activity barriers and safety.   Exercise Prescription Goal: Initial exercise prescription builds to 30-45 minutes a day of aerobic activity,  2-3 days per week.  Home exercise guidelines will be given to patient during program as part of exercise prescription that the participant will acknowledge.  Education: Aerobic Exercise: - Group verbal and visual presentation on the components of exercise prescription. Introduces F.I.T.T principle from ACSM for exercise prescriptions.  Reviews F.I.T.T. principles of aerobic exercise including progression. Written material given at graduation.   Education: Resistance Exercise: - Group verbal and visual presentation on the components of exercise prescription. Introduces F.I.T.T principle from ACSM for exercise prescriptions  Reviews F.I.T.T. principles of resistance exercise including progression. Written material given at graduation.    Education: Exercise & Equipment Safety: - Individual verbal instruction and demonstration of equipment use and safety with use of the equipment. Flowsheet Row Pulmonary Rehab from 01/25/2022 in [facility] Cardiac and Pulmonary Rehab  Date 12/26/21  Educator [facility]  Instruction Review Code 1- Verbalizes Understanding       Education: Exercise Physiology & General Exercise Guidelines: - Group verbal and written instruction with models to review the exercise physiology of the cardiovascular system and associated critical values. Provides general exercise guidelines with specific guidelines to those with heart or lung disease.    Education: Flexibility, Balance, Mind/Body Relaxation: - Group verbal and visual presentation with interactive activity on the components of exercise prescription. Introduces F.I.T.T principle from ACSM for exercise  prescriptions. Reviews F.I.T.T. principles of flexibility and balance exercise training including progression. Also discusses the mind body connection.  Reviews various relaxation techniques to help reduce and manage stress (i.e. Deep breathing, progressive muscle relaxation, and visualization). Balance handout provided to take home. Written material given at graduation.   Activity Barriers & Risk Stratification:  Activity Barriers & Cardiac Risk Stratification - 11/14/21 1402       Activity Barriers & Cardiac Risk Stratification   Activity Barriers Balance Concerns;Muscular Weakness   history of stroke; metastatic prostate cancer (overactive bladder)            6 Minute Walk:  6 Minute Walk     Row Name 12/26/21 1550         6 Minute Walk   Phase Initial     Distance 540 feet     Walk Time 6 minutes     # of Rest Breaks 0     MPH 1.02     METS 0.87     RPE 13     Perceived Dyspnea  0     Symptoms No     Resting HR 60 bpm     Resting BP 170/90     Resting Oxygen Saturation  96 %     Exercise Oxygen Saturation  during 6 min walk 96 %     Max Ex. HR 101 bpm     Max Ex. BP 190/80     2 Minute Post BP 156/82       Interval HR   1 Minute HR 83     2 Minute HR 93     3 Minute HR 87     4 Minute HR 87     5 Minute HR 85     6 Minute HR 101     2 Minute Post HR 69     Interval Heart Rate? Yes       Interval Oxygen   Interval Oxygen? Yes     Baseline Oxygen Saturation % 96 %     1 Minute Oxygen Saturation % 96 %     1 Minute Liters of Oxygen  0 L     2 Minute Oxygen Saturation % 96 %     2 Minute Liters of Oxygen 0 L     3 Minute Oxygen Saturation % 96 %     3 Minute Liters of Oxygen 0 L     4 Minute Oxygen Saturation % 96 %     4 Minute Liters of Oxygen 0 L     5 Minute Oxygen Saturation % 96 %     5 Minute Liters of Oxygen 0 L     6 Minute Oxygen Saturation % 96 %     6 Minute Liters of Oxygen 0 L     2 Minute Post Oxygen Saturation % 98 %     2 Minute Post  Liters of Oxygen 0 L             Oxygen Initial Assessment:  Oxygen Initial Assessment - 11/14/21 1405       Home Oxygen   Home Oxygen Device None    Sleep Oxygen Prescription CPAP    Compliance with Home Oxygen Use Yes      Initial 6 min Walk   Oxygen Used None      Program Oxygen Prescription   Program Oxygen Prescription None      Intervention   Short Term Goals To learn and understand importance of monitoring SPO2 with pulse oximeter and demonstrate accurate use of the pulse oximeter.;To learn and understand importance of maintaining oxygen saturations>88%;To learn and demonstrate proper pursed lip breathing techniques or other breathing techniques. ;To learn and demonstrate proper use of respiratory medications    Long  Term Goals Verbalizes importance of monitoring SPO2 with pulse oximeter and return demonstration;Maintenance of O2 saturations>88%;Compliance with respiratory medication;Exhibits proper breathing techniques, such as pursed lip breathing or other method taught during program session;Demonstrates proper use of MDIs             Oxygen Re-Evaluation:  Oxygen Re-Evaluation     Row Name 12/28/21 1029 01/23/22 1144           Program Oxygen Prescription   Program Oxygen Prescription None None        Home Oxygen   Home Oxygen Device None None      Sleep Oxygen Prescription CPAP CPAP      Home Exercise Oxygen Prescription -- None      Home Resting Oxygen Prescription -- None      Compliance with Home Oxygen Use Yes Yes        Goals/Expected Outcomes   Short Term Goals To learn and understand importance of monitoring SPO2 with pulse oximeter and demonstrate accurate use of the pulse oximeter.;To learn and understand importance of maintaining oxygen saturations>88%;To learn and demonstrate proper pursed lip breathing techniques or other breathing techniques.  To learn and understand importance of monitoring SPO2 with pulse oximeter and demonstrate accurate  use of the pulse oximeter.;To learn and understand importance of maintaining oxygen saturations>88%;To learn and demonstrate proper pursed lip breathing techniques or other breathing techniques.       Long  Term Goals Verbalizes importance of monitoring SPO2 with pulse oximeter and return demonstration;Maintenance of O2 saturations>88%;Compliance with respiratory medication;Exhibits proper breathing techniques, such as pursed lip breathing or other method taught during program session Verbalizes importance of monitoring SPO2 with pulse oximeter and return demonstration;Maintenance of O2 saturations>88%;Compliance with respiratory medication;Exhibits proper breathing techniques, such as pursed lip breathing or other method taught during program session      Comments  Reviewed PLB technique with pt.  Talked about how it works and it's importance in maintaining their exercise saturations. Ronalee Belts is doing well in rehab.  He is compliant with his CPAP. He tries to use his PLB to help control his breathing.  His wife helps him keep an eye on it at home.      Goals/Expected Outcomes Short: Become more profiecient at using PLB.   Long: Become independent at using PLB. Short: Continue to work on PLB Long: Continued compliance with CPAP               Oxygen Discharge (Final Oxygen Re-Evaluation):  Oxygen Re-Evaluation - 01/23/22 1144       Program Oxygen Prescription   Program Oxygen Prescription None      Home Oxygen   Home Oxygen Device None    Sleep Oxygen Prescription CPAP    Home Exercise Oxygen Prescription None    Home Resting Oxygen Prescription None    Compliance with Home Oxygen Use Yes      Goals/Expected Outcomes   Short Term Goals To learn and understand importance of monitoring SPO2 with pulse oximeter and demonstrate accurate use of the pulse oximeter.;To learn and understand importance of maintaining oxygen saturations>88%;To learn and demonstrate proper pursed lip breathing techniques  or other breathing techniques.     Long  Term Goals Verbalizes importance of monitoring SPO2 with pulse oximeter and return demonstration;Maintenance of O2 saturations>88%;Compliance with respiratory medication;Exhibits proper breathing techniques, such as pursed lip breathing or other method taught during program session    Comments Ronalee Belts is doing well in rehab.  He is compliant with his CPAP. He tries to use his PLB to help control his breathing.  His wife helps him keep an eye on it at home.    Goals/Expected Outcomes Short: Continue to work on PLB Long: Continued compliance with CPAP             Initial Exercise Prescription:  Initial Exercise Prescription - 12/26/21 1500       Date of Initial Exercise RX and Referring Provider   Date 12/26/21    Referring Provider Aleskerov      Oxygen   Maintain Oxygen Saturation 88% or higher      Treadmill   MPH 0.8    Grade 0    Minutes 15    METs 1      Recumbant Bike   Level 1    RPM 60    Minutes 15    METs 1      NuStep   Level 1    SPM 80    Minutes 15    METs 1      Prescription Details   Frequency (times per week) 2    Duration Progress to 30 minutes of continuous aerobic without signs/symptoms of physical distress      Intensity   THRR 40-80% of Max Heartrate 90-120    Ratings of Perceived Exertion 11-13    Perceived Dyspnea 0-4      Progression   Progression Continue to progress workloads to maintain intensity without signs/symptoms of physical distress.      Resistance Training   Training Prescription Yes    Weight 4.0    Reps 10-15             Perform Capillary Blood Glucose checks as needed.  Exercise Prescription Changes:   Exercise Prescription Changes     Row Name 12/26/21 1500 01/11/22 0900 01/24/22 1200  Response to Exercise   Blood Pressure (Admit) 170/90 128/68 124/66     Blood Pressure (Exercise) 190/80 136/74 --     Blood Pressure (Exit) 140/80 148/64 134/72     Heart  Rate (Admit) 60 bpm 62 bpm 61 bpm     Heart Rate (Exercise) 101 bpm 76 bpm 75 bpm     Heart Rate (Exit) 69 bpm 70 bpm 67 bpm     Oxygen Saturation (Admit) 96 % 98 % 98 %     Oxygen Saturation (Exercise) 96 % 97 % 97 %     Oxygen Saturation (Exit) 98 % 97 % 98 %     Rating of Perceived Exertion (Exercise) '13 13 13     ' Perceived Dyspnea (Exercise) 0 2 0     Symptoms none SOB --     Comments 6 MWT results 2nd full day of exercise --     Duration -- Progress to 30 minutes of  aerobic without signs/symptoms of physical distress Progress to 30 minutes of  aerobic without signs/symptoms of physical distress     Intensity -- THRR unchanged THRR unchanged       Progression   Progression -- Continue to progress workloads to maintain intensity without signs/symptoms of physical distress. Continue to progress workloads to maintain intensity without signs/symptoms of physical distress.     Average METs -- 1.66 1.75       Resistance Training   Training Prescription -- Yes Yes     Weight -- 4 lb 4 lb     Reps -- 10-15 10-15       Interval Training   Interval Training -- No No       Treadmill   MPH -- 0.8 --     Grade -- 0 --     Minutes -- 15 --     METs -- 1.61 --       Recumbant Bike   Level -- -- 1     Minutes -- -- 15     METs -- -- 2       NuStep   Level -- 2 3     Minutes -- 15 15     METs -- 1.5 1.5       Oxygen   Maintain Oxygen Saturation -- 88% or higher --              Exercise Comments:   Exercise Comments     Row Name 12/28/21 1029           Exercise Comments First full day of exercise!  Patient was oriented to gym and equipment including functions, settings, policies, and procedures.  Patient's individual exercise prescription and treatment plan were reviewed.  All starting workloads were established based on the results of the 6 minute walk test done at initial orientation visit.  The plan for exercise progression was also introduced and progression will be  customized based on patient's performance and goals.                Exercise Goals and Review:   Exercise Goals     Row Name 12/26/21 1554             Exercise Goals   Increase Physical Activity Yes       Intervention Provide advice, education, support and counseling about physical activity/exercise needs.;Develop an individualized exercise prescription for aerobic and resistive training based on initial evaluation findings, risk stratification, comorbidities and participant's personal goals.  Expected Outcomes Short Term: Attend rehab on a regular basis to increase amount of physical activity.;Long Term: Add in home exercise to make exercise part of routine and to increase amount of physical activity.;Long Term: Exercising regularly at least 3-5 days a week.       Increase Strength and Stamina Yes       Intervention Provide advice, education, support and counseling about physical activity/exercise needs.;Develop an individualized exercise prescription for aerobic and resistive training based on initial evaluation findings, risk stratification, comorbidities and participant's personal goals.       Expected Outcomes Short Term: Increase workloads from initial exercise prescription for resistance, speed, and METs.;Short Term: Perform resistance training exercises routinely during rehab and add in resistance training at home;Long Term: Improve cardiorespiratory fitness, muscular endurance and strength as measured by increased METs and functional capacity (6MWT)       Able to understand and use rate of perceived exertion (RPE) scale Yes       Intervention Provide education and explanation on how to use RPE scale       Expected Outcomes Short Term: Able to use RPE daily in rehab to express subjective intensity level;Long Term:  Able to use RPE to guide intensity level when exercising independently       Able to understand and use Dyspnea scale Yes       Intervention Provide education and  explanation on how to use Dyspnea scale       Expected Outcomes Short Term: Able to use Dyspnea scale daily in rehab to express subjective sense of shortness of breath during exertion;Long Term: Able to use Dyspnea scale to guide intensity level when exercising independently       Knowledge and understanding of Target Heart Rate Range (THRR) Yes       Intervention Provide education and explanation of THRR including how the numbers were predicted and where they are located for reference       Expected Outcomes Short Term: Able to state/look up THRR;Long Term: Able to use THRR to govern intensity when exercising independently;Short Term: Able to use daily as guideline for intensity in rehab       Able to check pulse independently Yes       Intervention Provide education and demonstration on how to check pulse in carotid and radial arteries.;Review the importance of being able to check your own pulse for safety during independent exercise       Expected Outcomes Short Term: Able to explain why pulse checking is important during independent exercise;Long Term: Able to check pulse independently and accurately       Understanding of Exercise Prescription Yes       Intervention Provide education, explanation, and written materials on patient's individual exercise prescription       Expected Outcomes Short Term: Able to explain program exercise prescription;Long Term: Able to explain home exercise prescription to exercise independently                Exercise Goals Re-Evaluation :  Exercise Goals Re-Evaluation     Row Name 12/28/21 1029 01/11/22 0943 01/24/22 1250         Exercise Goal Re-Evaluation   Exercise Goals Review Increase Physical Activity;Able to understand and use rate of perceived exertion (RPE) scale;Knowledge and understanding of Target Heart Rate Range (THRR);Understanding of Exercise Prescription;Increase Strength and Stamina;Able to understand and use Dyspnea scale;Able to check  pulse independently Increase Physical Activity;Increase Strength and Stamina Increase Physical Activity;Increase Strength and Stamina  Comments Reviewed RPE and dyspnea scales, THR and program prescription with pt today.  Pt voiced understanding and was given a copy of goals to take home. Rihaan is trying to get adjusted to rehab for the first couple of sessions that he has been here. Patient has attempted to leave rehab early several times. Staff has talked with patient and explained the program.  Patient was willing to come back to give it another try. If patient decides to pursue with the program, staff will start to focus on progressing with loads and levels. Will continue to monitor. Latravious has been better with attendance last few sessions.  He has increased to level 3 on NS and  tried TM.  Staff will continue to monitor.     Expected Outcomes Short: Use RPE daily to regulate intensity. Long: Follow program prescription in THR. Short: Continue attendance with rehab Long: Continue to build up overall strength and stamina Short; attend consistently Long: improve overall stamina              Discharge Exercise Prescription (Final Exercise Prescription Changes):  Exercise Prescription Changes - 01/24/22 1200       Response to Exercise   Blood Pressure (Admit) 124/66    Blood Pressure (Exit) 134/72    Heart Rate (Admit) 61 bpm    Heart Rate (Exercise) 75 bpm    Heart Rate (Exit) 67 bpm    Oxygen Saturation (Admit) 98 %    Oxygen Saturation (Exercise) 97 %    Oxygen Saturation (Exit) 98 %    Rating of Perceived Exertion (Exercise) 13    Perceived Dyspnea (Exercise) 0    Duration Progress to 30 minutes of  aerobic without signs/symptoms of physical distress    Intensity THRR unchanged      Progression   Progression Continue to progress workloads to maintain intensity without signs/symptoms of physical distress.    Average METs 1.75      Resistance Training   Training Prescription  Yes    Weight 4 lb    Reps 10-15      Interval Training   Interval Training No      Recumbant Bike   Level 1    Minutes 15    METs 2      NuStep   Level 3    Minutes 15    METs 1.5             Nutrition:  Target Goals: Understanding of nutrition guidelines, daily intake of sodium <1528m, cholesterol <2046m calories 30% from fat and 7% or less from saturated fats, daily to have 5 or more servings of fruits and vegetables.  Education: All About Nutrition: -Group instruction provided by verbal, written material, interactive activities, discussions, models, and posters to present general guidelines for heart healthy nutrition including fat, fiber, MyPlate, the role of sodium in heart healthy nutrition, utilization of the nutrition label, and utilization of this knowledge for meal planning. Follow up email sent as well. Written material given at graduation. [facility]  Date 01/04/22  Educator [facility] CenterInstruction Review Code 1- Verbalizes Understanding       Biometrics:  Pre Biometrics - 12/26/21 1554       Pre Biometrics   Height 5' 10.5" (1.791 m)    Weight 238 lb 4.8 oz (108.1 kg)    BMI (Calculated) 33.7              Nutrition  Therapy Plan and Nutrition Goals:  Nutrition Therapy & Goals - 01/23/22 1142       Nutrition Therapy   RD appointment deferred Yes             Nutrition Assessments:  MEDIFICTS Score Key: ?70 Need to make dietary changes  40-70 Heart Healthy Diet ? 40 Therapeutic Level Cholesterol Diet  Flowsheet Row Pulmonary Rehab from 12/26/2021 in [facility] Cardiac and Pulmonary Rehab  Picture Your Plate Total Score on Admission 52      Picture Your Plate Scores: <37 Unhealthy dietary pattern with much room for improvement. 41-50 Dietary pattern unlikely to meet recommendations for good health and room for improvement. 51-60 More healthful dietary pattern, with some  room for improvement.  >60 Healthy dietary pattern, although there may be some specific behaviors that could be improved.   Nutrition Goals Re-Evaluation:  Nutrition Goals Re-Evaluation     [place] Name 01/23/22 1141             Goals   Comment Ronalee Belts has deferred his nutrition appointment. His wife conitnues to help him focus on healhty eating.       Expected Outcome Continue to eat healthy                Nutrition Goals Discharge (Final Nutrition Goals Re-Evaluation):  Nutrition Goals Re-Evaluation - 01/23/22 1141       Goals   Comment Ronalee Belts has deferred his nutrition appointment. His wife conitnues to help him focus on healhty eating.    Expected Outcome Continue to eat healthy             Psychosocial: Target Goals: Acknowledge presence or absence of significant depression and/or stress, maximize coping skills, provide positive support system. Participant is able to verbalize types and ability to use techniques and skills needed for reducing stress and depression.   Education: Stress, Anxiety, and Depression - Group verbal and visual presentation to define topics covered.  Reviews how body is impacted by stress, anxiety, and depression.  Also discusses healthy ways to reduce stress and to treat/manage anxiety and depression.  Written material given at graduation. Flowsheet Row Pulmonary Rehab from 01/25/2022 in [facility] Cardiac and Pulmonary Rehab  Date 01/25/22  Educator [facility]  Instruction Review Code 1- [employer] Understanding       Education: Sleep Hygiene -Provides group verbal and written instruction about how sleep can affect your health.  Define sleep hygiene, discuss sleep cycles and impact of sleep habits. Review good sleep hygiene tips.    Initial Review & Psychosocial Screening:  Initial Psych Review & Screening - 11/14/21 1422       Initial Review   Current issues with Current Sleep Concerns;Current Stress Concerns    Source of Stress Concerns Chronic  Illness      Family Dynamics   Good Support System? Yes   wife     Barriers   Psychosocial barriers to participate in program There are no identifiable barriers or psychosocial needs.;The patient should benefit from training in stress management and relaxation.      Screening Interventions   Interventions Encouraged to exercise;Provide feedback about the scores to participant;To provide support and resources with identified psychosocial needs    Expected Outcomes Short Term goal: Utilizing psychosocial counselor, staff and physician to assist with identification of specific Stressors or current issues interfering with healing process. Setting desired goal for each stressor or current issue identified.;Long Term Goal: Stressors or current issues are controlled or eliminated.;Short Term goal: Identification  and review with participant of any Quality of Life or Depression concerns found by scoring the questionnaire.;Long Term goal: The participant improves quality of Life and PHQ9 Scores as seen by post scores and/or verbalization of changes             Quality of Life Scores:  Scores of 19 and below usually indicate a poorer quality of life in these areas.  A difference of  2-3 points is a clinically meaningful difference.  A difference of 2-3 points in the total score of the Quality of Life Index has been associated with significant improvement in overall quality of life, self-image, physical symptoms, and general health in studies assessing change in quality of life.  PHQ-9: Recent Review Flowsheet Data     Depression screen [facility] 2/9 12/26/2021   Decreased Interest 0   Down, Depressed, Hopeless 1   PHQ - 2 Score 1   Altered sleeping 3   Tired, decreased energy 3   Change in appetite 0   Feeling bad or failure about yourself  0   Trouble concentrating 0   Moving slowly or fidgety/restless 1   Suicidal thoughts 0   PHQ-9 Score 8   Difficult doing work/chores Somewhat difficult       Interpretation of Total Score  Total Score Depression Severity:  1-4 = Minimal depression, 5-9 = Mild depression, 10-14 = Moderate depression, 15-19 = Moderately severe depression, 20-27 = Severe depression   Psychosocial Evaluation and Intervention:  Psychosocial Evaluation - 11/14/21 1422       Psychosocial Evaluation & Interventions   Interventions Encouraged to exercise with the program and follow exercise prescription;Stress management education;Relaxation education    Comments Daniel is coming to pulmonary rehab with OSA. He has become deconditioned post stroke, prostate cancer and other medical issues. The stroke left him weak and requires him to use a rollator. His biggest stressor is his frequent urination which impacts his daily life and especially his sleep routine. He is up multiple times a night which leads to excessive daytime sleepiness. He doesn't feel like being active during the day becasue he is so tired. He is in contact with his MD and have been trialing different options to help in this area with little success. His wife is his main support system and caretaker. She and he are both wanting him to become stronger and more able to do things himself, however he is hesitant about progress when it comes to how tired he feels during the day.    Expected Outcomes Short: attend pulmonary rehab for education and exercise. Long: Develop and maintain positive self care habits.    Continue Psychosocial Services  Follow up required by staff             Psychosocial Re-Evaluation:  Psychosocial Re-Evaluation     [place] Name 01/23/22 1136             Psychosocial Re-Evaluation   Current issues with Current Stress Concerns;Current Depression;Current Sleep Concerns       Comments Ronalee Belts is doing well in rehab.  He is still feeling depressed.  He is still struggling with sleep at night as he is up and down to bathroom frequently to the bathroom.  He has tried multiple external  catherters as they would pull off at night.  He does not want to get an internal one placed.  He is tired during the day which he finds very frustrating. His wife is his biggest supporter and tries to  help him as she is able.       Expected Outcomes Short: Continue to rest during day to recover from the night. Long: Continue to focus on positive.                Psychosocial Discharge (Final Psychosocial Re-Evaluation):  Psychosocial Re-Evaluation - 01/23/22 1136       Psychosocial Re-Evaluation   Current issues with Current Stress Concerns;Current Depression;Current Sleep Concerns    Comments Ronalee Belts is doing well in rehab.  He is still feeling depressed.  He is still struggling with sleep at night as he is up and down to bathroom frequently to the bathroom.  He has tried multiple external catherters as they would pull off at night.  He does not want to get an internal one placed.  He is tired during the day which he finds very frustrating. His wife is his biggest supporter and tries to help him as she is able.    Expected Outcomes Short: Continue to rest during day to recover from the night. Long: Continue to focus on positive.             Education: Education Goals: Education classes will be provided on a weekly basis, covering required topics. Participant will state understanding/return demonstration of topics presented.  Learning Barriers/Preferences:  Learning Barriers/Preferences - 11/14/21 1422       Learning Barriers/Preferences   Learning Barriers None    Learning Preferences Individual Instruction             General Pulmonary Education Topics:  Infection Prevention: - Provides verbal and written material to individual with discussion of infection control including proper hand washing and proper equipment cleaning during exercise session. Flowsheet Row Pulmonary Rehab from 01/25/2022 in [facility] Cardiac and Pulmonary Rehab  Date 12/26/21  Educator [facility]  Instruction  Review Code 1- Verbalizes Understanding       Falls Prevention: - Provides verbal and written material to individual with discussion of falls prevention and safety. Flowsheet Row Pulmonary Rehab from 01/25/2022 in [facility] Cardiac and Pulmonary Rehab  Date 12/26/21  Educator [facility]  Instruction Review Code 1- Verbalizes Understanding       Chronic Lung Disease Review: - Group verbal instruction with posters, models, PowerPoint presentations and videos,  to review new updates, new respiratory medications, new advancements in procedures and treatments. Providing information on websites and "800" numbers for continued self-education. Includes information about supplement oxygen, available portable oxygen systems, continuous and intermittent flow rates, oxygen safety, concentrators, and Medicare reimbursement for oxygen. Explanation of Pulmonary Drugs, including class, frequency, complications, importance of spacers, rinsing mouth after steroid MDI's, and proper cleaning methods for nebulizers. Review of basic lung anatomy and physiology related to function, structure, and complications of lung disease. Review of risk factors. Discussion about methods for diagnosing sleep apnea and types of masks and machines for OSA. Includes a review of the use of types of environmental controls: home humidity, furnaces, filters, dust mite/pet prevention, HEPA vacuums. Discussion about weather changes, air quality and the benefits of nasal washing. Instruction on Warning signs, infection symptoms, calling MD promptly, preventive modes, and value of vaccinations. Review of effective airway clearance, coughing and/or vibration techniques. Emphasizing that all should Create an Action Plan. Written material given at graduation. Flowsheet Row Pulmonary Rehab from 01/25/2022 in [facility] Cardiac and Pulmonary Rehab  Education need identified 12/26/21  Date 01/18/22  Educator [facility]  Instruction Review Code 1- Verbalizes Understanding        AED/CPR: -  Group verbal and written instruction with the use of models to demonstrate the basic use of the AED with the basic ABC's of resuscitation.    Anatomy and Cardiac Procedures: - Group verbal and visual presentation and models provide information about basic cardiac anatomy and function. Reviews the testing methods done to diagnose heart disease and the outcomes of the test results. Describes the treatment choices: Medical Management, Angioplasty, or Coronary Bypass Surgery for treating various heart conditions including Myocardial Infarction, Angina, Valve Disease, and Cardiac Arrhythmias.  Written material given at graduation.   Medication Safety: - Group verbal and visual instruction to review commonly prescribed medications for heart and lung disease. Reviews the medication, class of the drug, and side effects. Includes the steps to properly store meds and maintain the prescription regimen.  Written material given at graduation. Flowsheet Row Pulmonary Rehab from 01/25/2022 in [facility]  Date 12/28/21  Educator [facility]  Instruction Review Code 1- Verbalizes Understanding       Other: -Provides group and verbal instruction on various topics (see comments)   Knowledge Questionnaire Score:  Knowledge Questionnaire Score - 12/26/21 1602       Knowledge Questionnaire Score   Pre Score 9/18              Core Components/Risk Factors/Patient Goals at Admission:  Personal Goals and Risk Factors at Admission - 12/26/21 1600       Core Components/Risk Factors/Patient Goals on Admission    Weight Management Yes    Intervention Weight Management: Develop a combined nutrition and exercise program designed to reach desired caloric intake, while maintaining appropriate intake of nutrient and fiber, sodium and fats, and appropriate energy expenditure required for the weight goal.;Weight Management: Provide education and appropriate resources to help  participant work on and attain dietary goals.;Weight Management/Obesity: Establish reasonable short term and long term weight goals.;Obesity: Provide education and appropriate resources to help participant work on and attain dietary goals.    Admit Weight 238 lb 4.8 oz (108.1 kg)    Goal Weight: Short Term 233 lb (105.7 kg)    Goal Weight: Long Term 228 lb (103.4 kg)    Expected Outcomes Short Term: Continue to assess and modify interventions until short term weight is achieved;Long Term: Adherence to nutrition and physical activity/exercise program aimed toward attainment of established weight goal;Understanding recommendations for meals to include 15-35% energy as protein, 25-35% energy from fat, 35-60% energy from carbohydrates, less than 241m of dietary cholesterol, 20-35 gm of total fiber daily;Understanding of distribution of calorie intake throughout the day with the consumption of 4-5 meals/snacks;Weight Loss: Understanding of general recommendations for a balanced deficit meal plan, which promotes 1-2 lb weight loss per week and includes a negative energy balance of [phone] kcal/d    Diabetes Yes    Intervention Provide education about signs/symptoms and action to take for hypo/hyperglycemia.;Provide education about proper nutrition, including hydration, and aerobic/resistive exercise prescription along with prescribed medications to achieve blood glucose in normal ranges: Fasting glucose 65-99 mg/dL    Expected Outcomes Long Term: Attainment of HbA1C < 7%.;Short Term: Participant verbalizes understanding of the signs/symptoms and immediate care of hyper/hypoglycemia, proper foot care and importance of medication, aerobic/resistive exercise and nutrition plan for blood glucose control.    Hypertension Yes    Intervention Provide education on lifestyle modifcations including regular physical activity/exercise, weight management, moderate sodium restriction and increased consumption of fresh fruit,  vegetables, and low fat dairy, alcohol moderation, and smoking cessation.;Monitor prescription  use compliance.    Expected Outcomes Short Term: Continued assessment and intervention until BP is < 140/45m HG in hypertensive participants. < 130/855mHG in hypertensive participants with diabetes, heart failure or chronic kidney disease.;Long Term: Maintenance of blood pressure at goal levels.    Lipids Yes    Intervention Provide education and support for participant on nutrition & aerobic/resistive exercise along with prescribed medications to achieve LDL <7045mHDL >46m81m  Expected Outcomes Short Term: Participant states understanding of desired cholesterol values and is compliant with medications prescribed. Participant is following exercise prescription and nutrition guidelines.;Long Term: Cholesterol controlled with medications as prescribed, with individualized exercise RX and with personalized nutrition plan. Value goals: LDL < 70mg75mL > 40 mg.             Education:Diabetes - Individual verbal and written instruction to review signs/symptoms of diabetes, desired ranges of glucose level fasting, after meals and with exercise. Acknowledge that pre and post exercise glucose checks will be done for 3 sessions at entry of program.   Know Your Numbers and Heart Failure: - Group verbal and visual instruction to discuss disease risk factors for cardiac and pulmonary disease and treatment options.  Reviews associated critical values for Overweight/Obesity, Hypertension, Cholesterol, and Diabetes.  Discusses basics of heart failure: signs/symptoms and treatments.  Introduces Heart Failure Zone chart for action plan for heart failure.  Written material given at graduation. Flow[facility]  Date 01/11/22  Educator [facility] Review Code 1- Verbalizes Understanding       Core Components/Risk Factors/Patient Goals Review:    Goals and Risk Factor Review     Row Name 01/23/22 1142             Core Components/Risk Factors/Patient Goals Review   Personal Goals Review Weight Management/Obesity;Hypertension;Diabetes;Lipids       Review Mike Ronalee Beltsoing well in rehab. His weight is slowly gaining, but he is trying to eat good and move but he just stays so tired from not sleeping.  His wife helps him check is pressures and sugars at home.  She makes sure that he is staying in the normal ranges.  His pressures vary from 120-140 and sugars are normal.  He does have a sweet tooth but finds his wife keeps him from eating it.  He has turned to tapioca to help.       Expected Outcomes Short; COntinue to work on weight loss Long: COnitnue to monitor risk factors.                Core Components/Risk Factors/Patient Goals at Discharge (Final Review):   Goals and Risk Factor Review - 01/23/22 1142       Core Components/Risk Factors/Patient Goals Review   Personal Goals Review Weight Management/Obesity;Hypertension;Diabetes;Lipids    Review Mike Ronalee Beltsoing well in rehab. His weight is slowly gaining, but he is trying to eat good and move but he just stays so tired from not sleeping.  His wife helps him check is pressures and sugars at home.  She makes sure that he is staying in the normal ranges.  His pressures vary from 120-140 and sugars are normal.  He does have a sweet tooth but finds his wife keeps him from eating it.  He has turned to tapioca to help.    Expected Outcomes Short; COntinue to work on weight loss Long: COnitnue to monitor risk factors.  ITP Comments:  ITP Comments     Row Name 11/14/21 1428 12/26/21 1545 12/28/21 1029 01/04/22 0849 02/01/22 0832   ITP Comments Initial telephone orientation completed. Diagnosis can be found in [facility] 11/17. EP orientation scheduled for Wednesday 12/28 at 1:30 pm. Completed 6MWT and gym orientation. Initial ITP created and sent for review to Dr. Zetta Bills,  Medical Director. First full day of exercise!  Patient was oriented to gym and equipment including functions, settings, policies, and procedures.  Patient's individual exercise prescription and treatment plan were reviewed.  All starting workloads were established based on the results of the 6 minute walk test done at initial orientation visit.  The plan for exercise progression was also introduced and progression will be customized based on patient's performance and goals. 30 Day review completed. Medical Director ITP review done, changes made as directed, and signed approval by Medical Director.    new to program 30 Day review completed. Medical Director ITP review done, changes made as directed, and signed approval by Medical Director.            Comments:

## 2022-02-06 ENCOUNTER — Encounter: Payer: Medicare Other | Admitting: *Deleted

## 2022-02-06 ENCOUNTER — Other Ambulatory Visit: Payer: Self-pay

## 2022-02-06 DIAGNOSIS — G4730 Sleep apnea, unspecified: Secondary | ICD-10-CM | POA: Diagnosis not present

## 2022-02-06 DIAGNOSIS — G4733 Obstructive sleep apnea (adult) (pediatric): Secondary | ICD-10-CM

## 2022-02-06 NOTE — Progress Notes (Signed)
Daily Session Note  Patient Details  Name: Andrew Callahan MRN: [id number] Date of Birth: Dec 07, 1934 Referring Provider:   Flowsheet Row Pulmonary Rehab from 12/26/2021 in [facility] Cardiac and Pulmonary Rehab  Referring Provider Lanney Gins       Encounter Date: 02/06/2022  Check In:  Session Check In - 02/06/22 1111       Check-In   Supervising physician immediately available to respond to emergencies See telemetry face sheet for immediately available ER MD    Location [facility]-Cardiac & Pulmonary Rehab    Staff Present Renita Papa, RN Moises Blood, BS, ACSM CEP, Exercise Physiologist;Amanda Oletta Darter, BA, ACSM CEP, Exercise Physiologist;Kelly Rosalia Hammers, MPA, RN    Virtual Visit No    Medication changes reported     No    Fall or balance concerns reported    No    Warm-up and Cool-down Performed on first and last piece of equipment    Resistance Training Performed Yes    VAD Patient? No    PAD/SET Patient? No      Pain Assessment   Currently in Pain? No/denies                Social History   Tobacco Use  Smoking Status Former  Smokeless Tobacco Never  Tobacco Comments   quit 50 years ago    Goals Met:  Independence with exercise equipment Exercise tolerated well No report of concerns or symptoms today Strength training completed today  Goals Unmet:  Not Applicable  Comments: Pt able to follow exercise prescription today without complaint.  Will continue to monitor for progression.    Dr. Emily Filbert is Medical Director for [place].  Dr. Ottie Glazier is Medical Director for [facility] Pulmonary Rehabilitation.

## 2022-02-08 ENCOUNTER — Encounter: Payer: Medicare Other | Attending: Pulmonary Disease

## 2022-02-08 ENCOUNTER — Encounter: Payer: Self-pay | Admitting: Oncology

## 2022-02-08 ENCOUNTER — Other Ambulatory Visit (HOSPITAL_COMMUNITY): Payer: Self-pay

## 2022-02-08 ENCOUNTER — Other Ambulatory Visit: Payer: Self-pay

## 2022-02-08 DIAGNOSIS — G4733 Obstructive sleep apnea (adult) (pediatric): Secondary | ICD-10-CM | POA: Diagnosis not present

## 2022-02-08 NOTE — Progress Notes (Signed)
Daily Session Note ? ?Patient Details  ?Name: Andrew Callahan ?MRN: [id number] ?Date of Birth: 11-Oct-1934 ?Referring Provider:   ?Flowsheet Row Pulmonary Rehab from 12/26/2021 in [facility] Cardiac and Pulmonary Rehab  ?Referring Provider Aleskerov  ? ?  ? ? ?Encounter Date: 02/08/2022 ? ?Check In: ? Session Check In - 02/08/22 1034   ? ?  ? Check-In  ? Supervising physician immediately available to respond to emergencies See telemetry face sheet for immediately available ER MD   ? Location [facility]-Cardiac & Pulmonary Rehab   ? Staff Present Birdie Sons, MPA, RN;Amanda Sommer, BA, ACSM CEP, Exercise Physiologist;Joseph Alden, [place]   ? Virtual Visit No   ? Medication changes reported     No   ? Fall or balance concerns reported    No   ? Warm-up and Cool-down Performed on first and last piece of equipment   ? Resistance Training Performed No   ? VAD Patient? No   ? PAD/SET Patient? No   ?  ? Pain Assessment  ? Currently in Pain? No/denies   ? ?  ?  ? ?  ? ? ? ? ? ?Social History  ? ?Tobacco Use  ?Smoking Status Former  ?Smokeless Tobacco Never  ?Tobacco Comments  ? quit 50 years ago  ? ? ?Goals Met:  ?Independence with exercise equipment ?Exercise tolerated well ?No report of concerns or symptoms today ?Strength training completed today ? ?Goals Unmet:  ?Not Applicable ? ?Comments: Pt able to follow exercise prescription today without complaint.  Will continue to monitor for progression. ?Reviewed home exercise with pt today.  Pt plans to use gym at [facility] for exercise.  Reviewed THR, pulse, RPE, sign and symptoms, pulse oximetery and when to call 911 or MD.  Also discussed weather considerations and indoor options.  Pt voiced understanding. ? ? ? ?Dr. Emily Filbert is Medical Director for [place].  ?Dr. Ottie Glazier is Medical Director for [facility] Pulmonary Rehabilitation. ?

## 2022-02-13 ENCOUNTER — Encounter: Payer: Medicare Other | Admitting: *Deleted

## 2022-02-13 ENCOUNTER — Other Ambulatory Visit: Payer: Self-pay

## 2022-02-13 DIAGNOSIS — G4733 Obstructive sleep apnea (adult) (pediatric): Secondary | ICD-10-CM | POA: Diagnosis not present

## 2022-02-13 NOTE — Progress Notes (Signed)
Daily Session Note ? ?Patient Details  ?Name: Andrew Callahan ?MRN: [id number] ?Date of Birth: 1934/05/01 ?Referring Provider:   ?Flowsheet Row Pulmonary Rehab from 12/26/2021 in [facility] Cardiac and Pulmonary Rehab  ?Referring Provider Aleskerov  ? ?  ? ? ?Encounter Date: 02/13/2022 ? ?Check In: ? Session Check In - 02/13/22 1100   ? ?  ? Check-In  ? Supervising physician immediately available to respond to emergencies See telemetry face sheet for immediately available ER MD   ? Location [facility]-Cardiac & Pulmonary Rehab   ? Staff Present Renita Papa, RN Moises Blood, BS, ACSM CEP, Exercise Physiologist;Amanda Oletta Darter, BA, ACSM CEP, Exercise Physiologist;Kelly Rosalia Hammers, MPA, RN   ? Virtual Visit No   ? Medication changes reported     No   ? Fall or balance concerns reported    No   ? Warm-up and Cool-down Performed on first and last piece of equipment   ? Resistance Training Performed Yes   ? VAD Patient? No   ? PAD/SET Patient? No   ?  ? Pain Assessment  ? Currently in Pain? No/denies   ? ?  ?  ? ?  ? ? ? ? ? ?Social History  ? ?Tobacco Use  ?Smoking Status Former  ?Smokeless Tobacco Never  ?Tobacco Comments  ? quit 50 years ago  ? ? ?Goals Met:  ?Independence with exercise equipment ?Exercise tolerated well ?No report of concerns or symptoms today ?Strength training completed today ? ?Goals Unmet:  ?Not Applicable ? ?Comments: Pt able to follow exercise prescription today without complaint.  Will continue to monitor for progression. ? ? ? ?Dr. Emily Filbert is Medical Director for [place].  ?Dr. Ottie Glazier is Medical Director for [facility] Pulmonary Rehabilitation. ?

## 2022-02-15 ENCOUNTER — Other Ambulatory Visit: Payer: Self-pay

## 2022-02-15 DIAGNOSIS — G4733 Obstructive sleep apnea (adult) (pediatric): Secondary | ICD-10-CM | POA: Diagnosis not present

## 2022-02-15 NOTE — Progress Notes (Signed)
Daily Session Note ? ?Patient Details  ?Name: Andrew Callahan ?MRN: [id number] ?Date of Birth: 21-Dec-1933 ?Referring Provider:   ?Flowsheet Row Pulmonary Rehab from 12/26/2021 in [facility] Cardiac and Pulmonary Rehab  ?Referring Provider Aleskerov  ? ?  ? ? ?Encounter Date: 02/15/2022 ? ?Check In: ? Session Check In - 02/15/22 1051   ? ?  ? Check-In  ? Supervising physician immediately available to respond to emergencies See telemetry face sheet for immediately available ER MD   ? Location [facility]-Cardiac & Pulmonary Rehab   ? Staff Present Birdie Sons, MPA, RN;Joseph [place], RCP,RRT,BSRT;Jessica [place], MA, RCEP, CCRP, CCET   ? Virtual Visit No   ? Medication changes reported     No   ? Fall or balance concerns reported    No   ? Warm-up and Cool-down Performed on first and last piece of equipment   ? Resistance Training Performed Yes   ? VAD Patient? No   ? PAD/SET Patient? No   ?  ? Pain Assessment  ? Currently in Pain? No/denies   ? ?  ?  ? ?  ? ? ? ? ? ?Social History  ? ?Tobacco Use  ?Smoking Status Former  ?Smokeless Tobacco Never  ?Tobacco Comments  ? quit 50 years ago  ? ? ?Goals Met:  ?Independence with exercise equipment ?Exercise tolerated well ?No report of concerns or symptoms today ?Strength training completed today ? ?Goals Unmet:  ?Not Applicable ? ?Comments: Pt able to follow exercise prescription today without complaint.  Will continue to monitor for progression. ? ? ? ?Dr. Emily Filbert is Medical Director for [place].  ?Dr. Ottie Glazier is Medical Director for [facility] Pulmonary Rehabilitation. ?

## 2022-02-20 ENCOUNTER — Other Ambulatory Visit: Payer: Self-pay

## 2022-02-20 ENCOUNTER — Encounter: Payer: Medicare Other | Admitting: *Deleted

## 2022-02-20 DIAGNOSIS — G4733 Obstructive sleep apnea (adult) (pediatric): Secondary | ICD-10-CM

## 2022-02-20 NOTE — Progress Notes (Signed)
Daily Session Note ? ?Patient Details  ?Name: Andrew Callahan ?MRN: [id number] ?Date of Birth: 05-06-1934 ?Referring Provider:   ?Flowsheet Row Pulmonary Rehab from 12/26/2021 in [facility] Cardiac and Pulmonary Rehab  ?Referring Provider Aleskerov  ? ?  ? ? ?Encounter Date: 02/20/2022 ? ?Check In: ? Session Check In - 02/20/22 1105   ? ?  ? Check-In  ? Supervising physician immediately available to respond to emergencies See telemetry face sheet for immediately available ER MD   ? Location [facility]-Cardiac & Pulmonary Rehab   ? Staff Present Renita Papa, RN Moises Blood, BS, ACSM CEP, Exercise Physiologist;Amanda Oletta Darter, BA, ACSM CEP, Exercise Physiologist;Kelly Rosalia Hammers, MPA, RN   ? Virtual Visit No   ? Medication changes reported     No   ? Fall or balance concerns reported    No   ? Warm-up and Cool-down Performed on first and last piece of equipment   ? Resistance Training Performed Yes   ? VAD Patient? No   ? PAD/SET Patient? No   ?  ? Pain Assessment  ? Currently in Pain? No/denies   ? ?  ?  ? ?  ? ? ? ? ? ?Social History  ? ?Tobacco Use  ?Smoking Status Former  ?Smokeless Tobacco Never  ?Tobacco Comments  ? quit 50 years ago  ? ? ?Goals Met:  ?Independence with exercise equipment ?Exercise tolerated well ?No report of concerns or symptoms today ?Strength training completed today ? ?Goals Unmet:  ?Not Applicable ? ?Comments: Pt able to follow exercise prescription today without complaint.  Will continue to monitor for progression. ? ? ? ?Dr. Emily Filbert is Medical Director for [place].  ?Dr. Ottie Glazier is Medical Director for [facility] Pulmonary Rehabilitation. ?

## 2022-02-22 ENCOUNTER — Other Ambulatory Visit: Payer: Self-pay | Admitting: *Deleted

## 2022-02-22 DIAGNOSIS — D509 Iron deficiency anemia, unspecified: Secondary | ICD-10-CM

## 2022-02-22 DIAGNOSIS — C61 Malignant neoplasm of prostate: Secondary | ICD-10-CM

## 2022-02-24 ENCOUNTER — Other Ambulatory Visit: Payer: Self-pay | Admitting: Oncology

## 2022-02-24 DIAGNOSIS — C61 Malignant neoplasm of prostate: Secondary | ICD-10-CM

## 2022-02-24 NOTE — Telephone Encounter (Signed)
Next appointment 03/02/22 ? ?Component Ref Range & Units 2 mo ago ?(12/01/21) 3 mo ago ?(11/19/21) 3 mo ago ?(11/18/21) 5 mo ago ?(09/01/21) 8 mo ago ?(06/02/21) 12 mo ago ?(03/01/21) 1 yr ago ?(02/16/21)  ?WBC 4.0 - 10.5 K/uL 7.4  8.1  11.1 High   7.5  8.8  7.4    ?RBC 4.22 - 5.81 MIL/uL 4.13 Low   4.33  4.75  4.24  4.53  3.77 Low   0-2 R   ?Hemoglobin 13.0 - 17.0 g/dL 12.0 Low   12.6 Low   13.9  12.2 Low   12.9 Low   10.9 Low     ?HCT 39.0 - 52.0 % 36.7 Low   38.1 Low   41.9  37.4 Low   39.2  36.2 Low     ?MCV 80.0 - 100.0 fL 88.9  88.0  88.2  88.2  86.5  96.0    ?MCH 26.0 - 34.0 pg 29.1  29.1  29.3  28.8  28.5  28.9    ?MCHC 30.0 - 36.0 g/dL 32.7  33.1  33.2  32.6  32.9  30.1    ?RDW 11.5 - 15.5 % 15.6 High   15.1  15.5  15.4  14.7  18.1 High     ?Platelets 150 - 400 K/uL 220  170  203  197  246  291    ?nRBC 0.0 - 0.2 % 0.0  0.0 CM  0.0  0.0  0.0  0.0    ?Neutrophils Relative % % 68   58  73  74  76    ?Neutro Abs 1.7 - 7.7 K/uL 5.1   6.5  5.6  6.4  5.7    ?Lymphocytes Relative % '21   31  17  19  15    '$ ?Lymphs Abs 0.7 - 4.0 K/uL 1.5   3.4  1.3  1.7  1.1    ?Monocytes Relative % '8   9  7  6  7    '$ ?Monocytes Absolute 0.1 - 1.0 K/uL 0.6   1.0  0.5  0.5  0.5    ?Eosinophils Relative % '2   1  1  1  1    '$ ?Eosinophils Absolute 0.0 - 0.5 K/uL 0.1   0.1  0.1  0.1  0.0    ?Basophils Relative % 0   1  1  0  0    ?Basophils Absolute 0.0 - 0.1 K/uL 0.0   0.1  0.0  0.0  0.0    ?Immature Granulocytes % 1   0  1  0  1    ?Abs Immature Granulocytes 0.00 - 0.07 K/uL 0.06   0.04 CM  0.04 CM  0.02 CM  0.04 CM    ?Comment: Performed at [facility], [address] [place], [place] [address]  ?WBC, UA        11-30 Abnormal  R   ?Epithelial Cells (non renal)        0-10 R   ?Renal Epithel, UA        0-10 Abnormal  R   ?Bacteria, UA        Many Abnormal  R   ?Resulting Agency  [facility] [place] [facility] [facility] [facility] [place] [facility] [place] [facility]  ?  ? ?  ?  ?Specimen Collected: 12/01/21 09:45 Last Resulted: 12/01/21 10:06  ?   ?  ?  ? ?

## 2022-02-26 NOTE — Progress Notes (Signed)
?[place]  ?[phone] [phone] ? ?IDTimouthy Callahan OB: 07/30/1934  MR#: [id number]  ATF#:[id number] ? ?Patient Care Team: ?Rusty Aus, MD as PCP - General (Internal Medicine) ?Lloyd Huger, MD as Consulting Physician (Oncology) ? 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 ? ?INTERVAL HISTORY: Patient returns to clinic today for repeat laboratory work, further evaluation, and continuation of [name].  He has chronic weakness and fatigue.  He also is recently been enrolled in cardiac rehab secondary to atrial fibrillation.  He also has chronic frequency and nocturia.  He has no neurologic complaints.  He denies any recent fevers or illnesses.  He has a good appetite and denies weight loss.  He has no chest pain, shortness of breath, cough, or hemoptysis.  He denies any nausea, vomiting, constipation, or diarrhea.  He has no other urologic complaints.  Patient offers no further specific complaints today. ? ?REVIEW OF SYSTEMS:   ?Review of Systems  ?Constitutional:  Positive for malaise/fatigue. Negative for fever and weight loss.  ?Respiratory: Negative.  Negative for cough and hemoptysis.   ?Cardiovascular: Negative.  Negative for chest pain and leg swelling.  ?Gastrointestinal: Negative.  Negative for abdominal pain.  ?Genitourinary:  Positive for frequency. Negative for hematuria.  ?Musculoskeletal: Negative.  Negative for back pain.  ?Skin: Negative.  Negative for rash.  ?Neurological:  Positive for weakness. Negative for dizziness, sensory change, focal weakness and headaches.  ?Psychiatric/Behavioral: Negative.  The patient is not nervous/anxious.   ? ?As per HPI. Otherwise, a complete review of systems is negative. ? ?PAST MEDICAL HISTORY: ?Past Medical History:  ?Diagnosis Date  ? Acquired hypothyroidism   ? Aortic atherosclerosis ([place])   ? Atrial fibrillation ([place])   ? Chronic constipation   ? CVA  (cerebral vascular accident) [facility])   ? DDD (degenerative disc disease), cervical   ? Diabetes mellitus without complication ([place])   ? Diverticulosis   ? Elevated prostate specific antigen (PSA)   ? Family history of leukemia   ? Family history of lung cancer   ? History of kidney stones   ? History of pituitary adenoma   ? Hyperlipidemia   ? Hypertension   ? Hypogonadism in male   ? Hypothyroid   ? Hypothyroidism   ? IDA (iron deficiency anemia)   ? Lumbar disc disease   ? Nephrolithiasis   ? 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 Paroxysmal A-fib ([place])   ? Prostate cancer ([place])   ? Sinus bradycardia   ? Sleep apnea   ? uses CPAP  ? Squamous cell carcinoma of skin 2014  ? Right hand. Mohs.  ? Stroke [facility])   ? throat / slight difficulty swallowing  ? ? ?PAST SURGICAL HISTORY: ?Past Surgical History:  ?Procedure Laterality Date  ? BACK SURGERY  1997  ? L3, 4, 5 laminectomy   ? [place]  ? hard wire removed, fusion of L3-5  ? BACK SURGERY  2009  ? laminotomy/foraminotomy w/ decompression of nerve root, facet thermal ablation L2-3.   ? COLONOSCOPY    ? COLONOSCOPY WITH PROPOFOL N/A 02/04/2021  ? Procedure: COLONOSCOPY WITH PROPOFOL;  Surgeon: Lesly Rubenstein, MD;  Location: [facility] ENDOSCOPY;  Service: Endoscopy;  Laterality: N/A;  STAT CBC BEFORE PROCEDURE  ? ESOPHAGOGASTRODUODENOSCOPY (EGD) WITH PROPOFOL N/A 02/04/2021  ? Procedure: ESOPHAGOGASTRODUODENOSCOPY (EGD) WITH PROPOFOL;  Surgeon: Lesly Rubenstein, MD;  Location: [facility] ENDOSCOPY;  Service: Endoscopy;  Laterality: N/A;  ? HERNIA REPAIR    ? HOLEP-LASER ENUCLEATION OF THE PROSTATE WITH MORCELLATION N/A 06/18/2020  ? Procedure: HOLEP-LASER ENUCLEATION OF THE PROSTATE WITH MORCELLATION;  Surgeon: Billey Co, MD;  Location: [facility] ORS;  Service: Urology;  Laterality: N/A;  ? JOINT REPLACEMENT    ? left knee partial  ? KIDNEY STONE SURGERY  2010  ? KNEE SURGERY Left 2007  ? Arthroscopic partial knee replacement,  replaced medial side   ? MOHS SURGERY Right 2014  ? squamous cell carcinoma  ? NECK SURGERY  2008  ? fusion with hardwire in place C3 through C7   ? pituitary adenoma  1994  ? benign  ? PROSTATE BIOPSY N/A 01/01/2020  ? Procedure: PROSTATE BIOPSY URO NAV FUSION;  Surgeon: Royston Cowper, MD;  Location: [facility] ORS;  Service: Urology;  Laterality: N/A;  ? SPINE SURGERY    ? SPINE SURGERY  2016  ? fusion L4   ? TONSILLECTOMY    ? ? ?FAMILY HISTORY: ?Family History  ?Problem Relation Age of Onset  ? Stroke Mother   ? Heart attack Father   ? Leukemia Sister 75  ? Lung cancer Brother 36  ? ? ?ADVANCED DIRECTIVES (Y/N):  N ? ?HEALTH MAINTENANCE: ?Social History  ? ?Tobacco Use  ? Smoking status: Former  ? Smokeless tobacco: Never  ? Tobacco comments:  ?  quit 50 years ago  ?Vaping Use  ? Vaping Use: Never used  ?Substance Use Topics  ? Alcohol use: Yes  ?  Comment: rarely  ? Drug use: Never  ? ? ? Colonoscopy: ? PAP: ? Bone density: ? Lipid panel: ? ?Allergies  ?Allergen Reactions  ? Metoprolol Other (See Comments)  ?  dizziness ?  ? Rivaroxaban Other (See Comments)  ?  hematuria ?  ? ? ?Current Outpatient Medications  ?Medication Sig Dispense Refill  ? abiraterone acetate (ZYTIGA) 250 MG tablet TAKE 3 TABLETS (750 MG TOTAL) BY MOUTH DAILY. TAKE ON AN EMPTY STOMACH 1 HOUR BEFORE OR 2 HOURS AFTER A MEAL 90 tablet 0  ? ACCU-CHEK GUIDE test strip daily. use as directed    ? Accu-Chek Softclix Lancets lancets daily.    ? B Complex-C (SUPER B COMPLEX PO) Take 1 capsule by mouth daily.     ? Calcium-Magnesium-Vitamin D (CALCIUM 1200+D3 PO) Take 2 tablets by mouth in the morning and at bedtime.    ? cetirizine (ZYRTEC) 10 MG tablet Take 10 mg by mouth daily as needed for allergies.    ? denosumab (XGEVA) 120 MG/1.7ML SOLN injection Inject 120 mg into the skin every 3 (three) months.    ? ELIQUIS 5 MG TABS tablet Take 5 mg by mouth 2 (two) times daily.    ? glipiZIDE (GLUCOTROL XL) 10 MG 24 hr tablet Take 5 mg by mouth daily.    ?  hydrocortisone (CORTEF) 10 MG tablet Take 10 mg by mouth 2 (two) times daily.     ? Iron, Ferrous Sulfate, 325 (65 Fe) MG TABS daily.    ? levothyroxine (SYNTHROID) 50 MCG tablet Take 50 mcg by mouth daily before breakfast.    ? levothyroxine (SYNTHROID) 50 MCG tablet Take by mouth.    ? Misc Natural Products (OSTEO BI-FLEX ADV JOINT SHIELD PO) Take 1 tablet by mouth daily.     ? Multiple Vitamin (MULTIVITAMIN WITH MINERALS) TABS tablet Take 1 tablet by mouth daily. One-A-Day Men's 50+    ? polyethylene glycol (MIRALAX / GLYCOLAX) 17 g packet  Take 17 g by mouth daily. W/coffee    ? Probiotic Product (ALIGN) 4 MG CAPS Take 4 mg by mouth daily.     ? triamcinolone cream (KENALOG) 0.1 % Apply 1 application topically as directed. Qd to bid up to 5 days per week to right hand until clear, then prn flares, avoid face, groin, axilla 80 g 1  ? valsartan (DIOVAN) 320 MG tablet Take 160 mg by mouth 2 (two) times daily.    ? acyclovir ointment (ZOVIRAX) 5 % Apply 1 application topically every 3 (three) hours. Apply to aa groin prn flares q 3 hours until resolved (Patient not taking: Reported on 03/02/2022) 30 g 11  ? amoxicillin (AMOXIL) 500 MG capsule SMARTSIG:4 Capsule(s) By Mouth (Patient not taking: Reported on 03/02/2022)    ? Blood Glucose Monitoring Suppl (ACCU-CHEK GUIDE) w/Device KIT See admin instructions.    ? cetirizine (ZYRTEC) 10 MG tablet Take 1 tablet by mouth daily.    ? diltiazem (CARDIZEM) 30 MG tablet Take 30 mg by mouth daily as needed. (Patient not taking: Reported on 03/02/2022)    ? Leuprolide Acetate (ELIGARD [place]) Inject into the skin. 1 injection every 6 months.    ? melatonin 5 MG TABS Take 10 mg by mouth at bedtime. (Patient not taking: Reported on 03/02/2022)    ? ?No current facility-administered medications for this visit.  ? ? ?OBJECTIVE: ?There were no vitals filed for this visit. ?   There is no height or weight on file to calculate BMI.    ECOG FS:1 - Symptomatic but completely  ambulatory ? ?General: Well-developed, well-nourished, no acute distress.  Sitting in a wheelchair. ?Eyes: Pink conjunctiva, anicteric sclera. ?HEENT: Normocephalic, moist mucous membranes. ?Lungs: No audible wheezing

## 2022-02-27 ENCOUNTER — Other Ambulatory Visit: Payer: Self-pay

## 2022-02-27 ENCOUNTER — Encounter: Payer: Medicare Other | Admitting: *Deleted

## 2022-02-27 DIAGNOSIS — G4733 Obstructive sleep apnea (adult) (pediatric): Secondary | ICD-10-CM | POA: Diagnosis not present

## 2022-02-27 NOTE — Progress Notes (Signed)
Daily Session Note ? ?Patient Details  ?Name: Andrew Callahan ?MRN: [id number] ?Date of Birth: 1934-05-29 ?Referring Provider:   ?Flowsheet Row Pulmonary Rehab from 12/26/2021 in [facility] Cardiac and Pulmonary Rehab  ?Referring Provider Aleskerov  ? ?  ? ? ?Encounter Date: 02/27/2022 ? ?Check In: ? Session Check In - 02/27/22 1114   ? ?  ? Check-In  ? Supervising physician immediately available to respond to emergencies See telemetry face sheet for immediately available ER MD   ? Location [facility]-Cardiac & Pulmonary Rehab   ? Staff Present Renita Papa, RN Moises Blood, BS, ACSM CEP, Exercise Physiologist;Kelly Rosalia Hammers, MPA, RN   ? Virtual Visit No   ? Medication changes reported     No   ? Fall or balance concerns reported    No   ? Warm-up and Cool-down Performed on first and last piece of equipment   ? Resistance Training Performed Yes   ? VAD Patient? No   ? PAD/SET Patient? No   ?  ? Pain Assessment  ? Currently in Pain? No/denies   ? ?  ?  ? ?  ? ? ? ? ? ?Social History  ? ?Tobacco Use  ?Smoking Status Former  ?Smokeless Tobacco Never  ?Tobacco Comments  ? quit 50 years ago  ? ? ?Goals Met:  ?Independence with exercise equipment ?Exercise tolerated well ?No report of concerns or symptoms today ?Strength training completed today ? ?Goals Unmet:  ?Not Applicable ? ?Comments: Pt able to follow exercise prescription today without complaint.  Will continue to monitor for progression. ? ? ? ?Dr. Emily Filbert is Medical Director for [place].  ?Dr. Ottie Glazier is Medical Director for [facility] Pulmonary Rehabilitation. ?

## 2022-03-01 ENCOUNTER — Other Ambulatory Visit: Payer: Self-pay

## 2022-03-01 ENCOUNTER — Encounter: Payer: Self-pay | Admitting: *Deleted

## 2022-03-01 DIAGNOSIS — G4733 Obstructive sleep apnea (adult) (pediatric): Secondary | ICD-10-CM | POA: Diagnosis not present

## 2022-03-01 NOTE — Progress Notes (Signed)
Daily Session Note ? ?Patient Details  ?Name: Andrew Callahan ?MRN: [id number] ?Date of Birth: 05/14/1934 ?Referring Provider:   ?Flowsheet Row Pulmonary Rehab from 12/26/2021 in [facility] Cardiac and Pulmonary Rehab  ?Referring Provider Aleskerov  ? ?  ? ? ?Encounter Date: 03/01/2022 ? ?Check In: ? Session Check In - 03/01/22 1105   ? ?  ? Check-In  ? Supervising physician immediately available to respond to emergencies See telemetry face sheet for immediately available ER MD   ? Location [facility]-Cardiac & Pulmonary Rehab   ? Staff Present Birdie Sons, MPA, RN;Joseph Hood, RCP,RRT,BSRT;Melissa Livingston, RDN, LDN;Jessica Hawkins, MA, RCEP, CCRP, CCET   ? Virtual Visit No   ? Medication changes reported     No   ? Fall or balance concerns reported    No   ? Warm-up and Cool-down Performed on first and last piece of equipment   ? Resistance Training Performed Yes   ? VAD Patient? No   ? PAD/SET Patient? No   ?  ? Pain Assessment  ? Currently in Pain? No/denies   ? ?  ?  ? ?  ? ? ? ? ? ?Social History  ? ?Tobacco Use  ?Smoking Status Former  ?Smokeless Tobacco Never  ?Tobacco Comments  ? quit 50 years ago  ? ? ?Goals Met:  ?Independence with exercise equipment ?Exercise tolerated well ?No report of concerns or symptoms today ?Strength training completed today ? ?Goals Unmet:  ?Not Applicable ? ?Comments: Pt able to follow exercise prescription today without complaint.  Will continue to monitor for progression. ? ? ? ?Dr. Emily Filbert is Medical Director for [place].  ?Dr. Ottie Glazier is Medical Director for [facility] Pulmonary Rehabilitation. ?

## 2022-03-01 NOTE — Progress Notes (Signed)
Pulmonary Individual Treatment Plan ? ?Patient Details  ?Name: Andrew Callahan ?MRN: [id number] ?Date of Birth: [birth year] ?Referring Provider:   ?Flowsheet Row Pulmonary Rehab from 12/26/2021 in [facility] Cardiac and Pulmonary Rehab  ?Referring Provider Aleskerov  ? ?  ? ? ?Initial Encounter Date:  ?Flowsheet Row Pulmonary Rehab from 12/26/2021 in [facility] Cardiac and Pulmonary Rehab  ?Date 12/26/21  ? ?  ? ? ?Visit Diagnosis: OSA (obstructive sleep apnea) ? ?Patient's Home Medications on Admission: ? ?Current Outpatient Medications:  ?  abiraterone acetate (ZYTIGA) 250 MG tablet, TAKE 3 TABLETS (750 MG TOTAL) BY MOUTH DAILY. TAKE ON AN EMPTY STOMACH 1 HOUR BEFORE OR 2 HOURS AFTER A MEAL, Disp: 90 tablet, Rfl: 0 ?  ACCU-CHEK GUIDE test strip, daily. use as directed, Disp: , Rfl:  ?  Accu-Chek Softclix Lancets lancets, daily., Disp: , Rfl:  ?  acyclovir ointment (ZOVIRAX) 5 %, Apply 1 application topically every 3 (three) hours. Apply to aa groin prn flares q 3 hours until resolved, Disp: 30 g, Rfl: 11 ?  B Complex-C (SUPER B COMPLEX PO), Take 1 capsule by mouth daily. , Disp: , Rfl:  ?  Blood Glucose Monitoring Suppl (ACCU-CHEK GUIDE) w/Device KIT, See admin instructions., Disp: , Rfl:  ?  Calcium-Magnesium-Vitamin D (CALCIUM 1200+D3 PO), Take 2 tablets by mouth in the morning and at bedtime., Disp: , Rfl:  ?  cetirizine (ZYRTEC) 10 MG tablet, Take 10 mg by mouth daily as needed for allergies., Disp: , Rfl:  ?  denosumab (XGEVA) 120 MG/1.7ML SOLN injection, Inject 120 mg into the skin every 3 (three) months., Disp: , Rfl:  ?  ELIQUIS 5 MG TABS tablet, Take 5 mg by mouth 2 (two) times daily., Disp: , Rfl:  ?  glipiZIDE (GLUCOTROL XL) 10 MG 24 hr tablet, Take 5 mg by mouth daily., Disp: , Rfl:  ?  hydrocortisone (CORTEF) 10 MG tablet, Take 10 mg by mouth 2 (two) times daily. , Disp: , Rfl:  ?  Iron, Ferrous Sulfate, 325 (65 Fe) MG TABS, daily., Disp: , Rfl:  ?  Leuprolide Acetate (ELIGARD [place]), Inject into the skin. 1 injection  every 6 months., Disp: , Rfl:  ?  levothyroxine (SYNTHROID) 50 MCG tablet, Take 50 mcg by mouth daily before breakfast., Disp: , Rfl:  ?  melatonin 5 MG TABS, Take 10 mg by mouth at bedtime., Disp: , Rfl:  ?  Misc Natural Products (OSTEO BI-FLEX ADV JOINT SHIELD PO), Take 1 tablet by mouth daily. , Disp: , Rfl:  ?  Multiple Vitamin (MULTIVITAMIN WITH MINERALS) TABS tablet, Take 1 tablet by mouth daily. One-A-Day Men's 50+, Disp: , Rfl:  ?  polyethylene glycol (MIRALAX / GLYCOLAX) 17 g packet, Take 17 g by mouth daily. W/coffee, Disp: , Rfl:  ?  Probiotic Product (ALIGN) 4 MG CAPS, Take 4 mg by mouth daily. , Disp: , Rfl:  ?  triamcinolone cream (KENALOG) 0.1 %, Apply 1 application topically as directed. Qd to bid up to 5 days per week to right hand until clear, then prn flares, avoid face, groin, axilla, Disp: 80 g, Rfl: 1 ?  Trospium Chloride 60 MG CP24, Take 1 capsule (60 mg total) by mouth daily. (Patient not taking: Reported on 11/18/2021), Disp: 30 capsule, Rfl: 11 ?  valsartan (DIOVAN) 320 MG tablet, Take 160 mg by mouth 2 (two) times daily., Disp: , Rfl:  ? ?Past Medical History: ?Past Medical History:  ?Diagnosis Date  ? Acquired hypothyroidism   ? Aortic atherosclerosis ([place])   ?  Atrial fibrillation ([place])   ? Chronic constipation   ? CVA (cerebral vascular accident) [facility])   ? DDD (degenerative disc disease), cervical   ? Diabetes mellitus without complication ([place])   ? Diverticulosis   ? Elevated prostate specific antigen (PSA)   ? Family history of leukemia   ? Family history of lung cancer   ? History of kidney stones   ? History of pituitary adenoma   ? Hyperlipidemia   ? Hypertension   ? Hypogonadism in male   ? Hypothyroid   ? Hypothyroidism   ? IDA (iron deficiency anemia)   ? Lumbar disc disease   ? Nephrolithiasis   ? 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 Paroxysmal A-fib ([place])   ? Prostate cancer ([place])   ? Sinus bradycardia   ? Sleep apnea   ? uses CPAP  ?  Squamous cell carcinoma of skin 2014  ? Right hand. Mohs.  ? Stroke [facility])   ? throat / slight difficulty swallowing  ? ? ?Tobacco Use: ?Social History  ? ?Tobacco Use  ?Smoking Status Former  ?Smokeless Tobacco Never  ?Tobacco Comments  ? quit 50 years ago  ? ? ?Labs: ?Review Flowsheet   ? ?  ?  Latest Ref Rng & Units 11/18/2021  ?Labs for [facility] Cardiac and Pulmonary Rehab  ?Hemoglobin A1c 4.8 - 5.6 % 5.9    ?  ? ? Multiple values from one day are sorted in reverse-chronological order  ?  ?  ? ? ? ?Pulmonary Assessment Scores: ? Pulmonary Assessment Scores   ? ? [place] Name 12/26/21 1604 12/28/21 1119  ?  ?  ? ADL UCSD  ? ADL Phase -- Entry   ? SOB Score total -- 19   ? Rest -- 0   ? Walk -- 0   ? Stairs -- 3   ? Bath -- 0   ? Dress -- 0   ? Shop -- 1   ?  ? CAT Score  ? CAT Score 18 18   ?  ? mMRC Score  ? mMRC Score 1 1   ? ?  ?  ? ?  ?  ?UCSD: ?Self-administered rating of dyspnea associated with activities of daily living (ADLs) ?6-point scale (0 = "not at all" to 5 = "maximal or unable to do because of breathlessness")  ?Scoring Scores range from 0 to 120.  Minimally important difference is 5 units ? ?CAT: ?CAT can identify the health impairment of COPD patients and is better correlated with disease progression.  ?CAT has a scoring range of zero to 40. The CAT score is classified into four groups of low (less than 10), medium (10 - 20), high (21-30) and very high (31-40) based on the impact level of disease on health status. A CAT score over 10 suggests significant symptoms.  A worsening CAT score could be explained by an exacerbation, poor medication adherence, poor inhaler technique, or progression of COPD or comorbid conditions.  ?CAT MCID is 2 points ? ?mMRC: ?mMRC (Modified Medical Research Council) Dyspnea Scale is used to assess the degree of baseline functional disability in patients of respiratory disease due to dyspnea. ?No minimal important difference is established. A decrease in score of 1 point or  greater is considered a positive change.  ? ?Pulmonary Function Assessment: ? ? ?Exercise Target Goals: ?Exercise Program Goal: ?Individual exercise prescription set using results from initial 6 min walk test and THRR while considering  patient?s activity barriers  and safety.  ? ?Exercise Prescription Goal: ?Initial exercise prescription builds to 30-45 minutes a day of aerobic activity, 2-3 days per week.  Home exercise guidelines will be given to patient during program as part of exercise prescription that the participant will acknowledge. ? ?Education: Aerobic Exercise: ?- Group verbal and visual presentation on the components of exercise prescription. Introduces F.I.T.T principle from ACSM for exercise prescriptions.  Reviews F.I.T.T. principles of aerobic exercise including progression. Written material given at graduation. ?Flowsheet Row Pulmonary Rehab from 02/15/2022 in [facility] Cardiac and Pulmonary Rehab  ?Date 02/08/22  ?Educator [facility]  ?Instruction Review Code 1- Verbalizes Understanding  ? ?  ? ? ?Education: Resistance Exercise: ?- Group verbal and visual presentation on the components of exercise prescription. Introduces F.I.T.T principle from ACSM for exercise prescriptions  Reviews F.I.T.T. principles of resistance exercise including progression. Written material given at graduation. ?Flowsheet Row Pulmonary Rehab from 02/15/2022 in [facility] Cardiac and Pulmonary Rehab  ?Date 02/15/22  ?Educator AS  ?Instruction Review Code 1- Verbalizes Understanding  ? ?  ? ?  ?Education: Exercise & Equipment Safety: ?- Individual verbal instruction and demonstration of equipment use and safety with use of the equipment. ?Flowsheet Row Pulmonary Rehab from 02/15/2022 in [facility] Cardiac and Pulmonary Rehab  ?Date 12/26/21  ?Educator [place]  ?Instruction Review Code 1- Verbalizes Understanding  ? ?  ? ? ?Education: Exercise Physiology & General Exercise Guidelines: ?- Group verbal and written instruction with models to review the exercise  physiology of the cardiovascular system and associated critical values. Provides general exercise guidelines with specific guidelines to those with heart or lung disease.  ?Flowsheet Row Pulmonary Rehab from 3/8/

## 2022-03-02 ENCOUNTER — Inpatient Hospital Stay (HOSPITAL_BASED_OUTPATIENT_CLINIC_OR_DEPARTMENT_OTHER): Payer: Medicare Other | Admitting: Oncology

## 2022-03-02 ENCOUNTER — Other Ambulatory Visit: Payer: Self-pay

## 2022-03-02 ENCOUNTER — Inpatient Hospital Stay: Payer: Medicare Other | Attending: Oncology

## 2022-03-02 ENCOUNTER — Ambulatory Visit: Payer: Medicare Other | Admitting: Pharmacist

## 2022-03-02 ENCOUNTER — Encounter: Payer: Self-pay | Admitting: Oncology

## 2022-03-02 ENCOUNTER — Inpatient Hospital Stay: Payer: Medicare Other

## 2022-03-02 DIAGNOSIS — D509 Iron deficiency anemia, unspecified: Secondary | ICD-10-CM | POA: Diagnosis not present

## 2022-03-02 DIAGNOSIS — C7951 Secondary malignant neoplasm of bone: Secondary | ICD-10-CM | POA: Diagnosis not present

## 2022-03-02 DIAGNOSIS — C61 Malignant neoplasm of prostate: Secondary | ICD-10-CM

## 2022-03-02 LAB — CBC WITH DIFFERENTIAL/PLATELET
Abs Immature Granulocytes: 0.05 10*3/uL (ref 0.00–0.07)
Basophils Absolute: 0 10*3/uL (ref 0.0–0.1)
Basophils Relative: 1 %
Eosinophils Absolute: 0.1 10*3/uL (ref 0.0–0.5)
Eosinophils Relative: 2 %
HCT: 29.5 % — ABNORMAL LOW (ref 39.0–52.0)
Hemoglobin: 9 g/dL — ABNORMAL LOW (ref 13.0–17.0)
Immature Granulocytes: 1 %
Lymphocytes Relative: 20 %
Lymphs Abs: 1.3 10*3/uL (ref 0.7–4.0)
MCH: 26.8 pg (ref 26.0–34.0)
MCHC: 30.5 g/dL (ref 30.0–36.0)
MCV: 87.8 fL (ref 80.0–100.0)
Monocytes Absolute: 0.5 10*3/uL (ref 0.1–1.0)
Monocytes Relative: 7 %
Neutro Abs: 4.5 10*3/uL (ref 1.7–7.7)
Neutrophils Relative %: 69 %
Platelets: 240 10*3/uL (ref 150–400)
RBC: 3.36 MIL/uL — ABNORMAL LOW (ref 4.22–5.81)
RDW: 16.1 % — ABNORMAL HIGH (ref 11.5–15.5)
WBC: 6.5 10*3/uL (ref 4.0–10.5)
nRBC: 0 % (ref 0.0–0.2)

## 2022-03-02 LAB — COMPREHENSIVE METABOLIC PANEL
ALT: 15 U/L (ref 0–44)
AST: 30 U/L (ref 15–41)
Albumin: 3.7 g/dL (ref 3.5–5.0)
Alkaline Phosphatase: 56 U/L (ref 38–126)
Anion gap: 6 (ref 5–15)
BUN: 17 mg/dL (ref 8–23)
CO2: 21 mmol/L — ABNORMAL LOW (ref 22–32)
Calcium: 8.4 mg/dL — ABNORMAL LOW (ref 8.9–10.3)
Chloride: 112 mmol/L — ABNORMAL HIGH (ref 98–111)
Creatinine, Ser: 1 mg/dL (ref 0.61–1.24)
GFR, Estimated: >60 mL/min (ref >60)
Glucose, Bld: 125 mg/dL — ABNORMAL HIGH (ref 70–99)
Potassium: 3.8 mmol/L (ref 3.5–5.1)
Sodium: 139 mmol/L (ref 135–145)
Total Bilirubin: 0.2 mg/dL — ABNORMAL LOW (ref 0.3–1.2)
Total Protein: 6.7 g/dL (ref 6.5–8.1)

## 2022-03-02 LAB — IRON AND TIBC
Iron: 31 ug/dL — ABNORMAL LOW (ref 45–182)
Saturation Ratios: 8 % — ABNORMAL LOW (ref 17.9–39.5)
TIBC: 391 ug/dL (ref 250–450)
UIBC: 360 ug/dL

## 2022-03-02 LAB — PSA: Prostatic Specific Antigen: <0.01 ng/mL (ref 0.00–4.00)

## 2022-03-02 MED ORDER — DENOSUMAB 120 MG/1.7ML ~~LOC~~ SOLN
120.0000 mg | Freq: Once | SUBCUTANEOUS | Status: AC
  Administered 2022-03-02: 120 mg via SUBCUTANEOUS
  Filled 2022-03-02: qty 1.7

## 2022-03-02 NOTE — Progress Notes (Signed)
Pt wife will like to know is it possible to cut down on some medications. For example: XYTIGA will like to know if they can cut back to 2 tablets. Also, to cut back on the injectionsif possible.  ? ?

## 2022-03-03 ENCOUNTER — Encounter: Payer: Self-pay | Admitting: Oncology

## 2022-03-06 ENCOUNTER — Other Ambulatory Visit: Payer: Self-pay

## 2022-03-06 ENCOUNTER — Encounter: Payer: Medicare Other | Admitting: *Deleted

## 2022-03-06 DIAGNOSIS — G4733 Obstructive sleep apnea (adult) (pediatric): Secondary | ICD-10-CM | POA: Diagnosis not present

## 2022-03-06 NOTE — Progress Notes (Signed)
Daily Session Note ? ?Patient Details  ?Name: Andrew Callahan ?MRN: [id number] ?Date of Birth: [birth year] ?Referring Provider:   ?Flowsheet Row Pulmonary Rehab from 12/26/2021 in [facility] Cardiac and Pulmonary Rehab  ?Referring Provider Aleskerov  ? ?  ? ? ?Encounter Date: 03/06/2022 ? ?Check In: ? Session Check In - 03/06/22 1110   ? ?  ? Check-In  ? Supervising physician immediately available to respond to emergencies See telemetry face sheet for immediately available ER MD   ? Location [facility]-Cardiac & Pulmonary Rehab   ? Staff Present Renita Papa, RN Moises Blood, BS, ACSM CEP, Exercise Physiologist;Amanda Oletta Darter, BA, ACSM CEP, Exercise Physiologist;Kelly Rosalia Hammers, MPA, RN   ? Virtual Visit No   ? Medication changes reported     No   ? Fall or balance concerns reported    No   ? Warm-up and Cool-down Performed on first and last piece of equipment   ? Resistance Training Performed Yes   ? VAD Patient? No   ? PAD/SET Patient? No   ?  ? Pain Assessment  ? Currently in Pain? No/denies   ? ?  ?  ? ?  ? ? ? ? ? ?Social History  ? ?Tobacco Use  ?Smoking Status Former  ?Smokeless Tobacco Never  ?Tobacco Comments  ? quit 50 years ago  ? ? ?Goals Met:  ?Independence with exercise equipment ?Exercise tolerated well ?No report of concerns or symptoms today ?Strength training completed today ? ?Goals Unmet:  ?Not Applicable ? ?Comments: Pt able to follow exercise prescription today without complaint.  Will continue to monitor for progression. ? ? ? ?Dr. Emily Filbert is Medical Director for [place].  ?Dr. Ottie Glazier is Medical Director for [facility] Pulmonary Rehabilitation. ?

## 2022-03-08 ENCOUNTER — Other Ambulatory Visit: Payer: Self-pay

## 2022-03-08 DIAGNOSIS — G4733 Obstructive sleep apnea (adult) (pediatric): Secondary | ICD-10-CM | POA: Diagnosis not present

## 2022-03-08 NOTE — Progress Notes (Signed)
Daily Session Note ? ?Patient Details  ?Name: Andrew Callahan ?MRN: [id number] ?Date of Birth: 09-08-1934 ?Referring Provider:   ?Flowsheet Row Pulmonary Rehab from 12/26/2021 in [facility] Cardiac and Pulmonary Rehab  ?Referring Provider Aleskerov  ? ?  ? ? ?Encounter Date: 03/08/2022 ? ?Check In: ? Session Check In - 03/08/22 1114   ? ?  ? Check-In  ? Supervising physician immediately available to respond to emergencies See telemetry face sheet for immediately available ER MD   ? Location [facility]-Cardiac & Pulmonary Rehab   ? Staff Present Birdie Sons, MPA, RN;Joseph Tessie Fass, Lindell Spar, BA, ACSM CEP, Exercise Physiologist;Melissa [place], RDN, LDN   ? Virtual Visit No   ? Medication changes reported     No   ? Fall or balance concerns reported    No   ? Warm-up and Cool-down Performed on first and last piece of equipment   ? Resistance Training Performed Yes   body weight only  ? VAD Patient? No   ? PAD/SET Patient? No   ?  ? Pain Assessment  ? Currently in Pain? No/denies   ? ?  ?  ? ?  ? ? ? ? ? ?Social History  ? ?Tobacco Use  ?Smoking Status Former  ?Smokeless Tobacco Never  ?Tobacco Comments  ? quit 50 years ago  ? ? ?Goals Met:  ?Independence with exercise equipment ?Exercise tolerated well ?No report of concerns or symptoms today ?Strength training completed today ? ?Goals Unmet:  ?Not Applicable ? ?Comments: Pt able to follow exercise prescription today without complaint.  Will continue to monitor for progression. ? ? ? ?Dr. Emily Filbert is Medical Director for [place].  ?Dr. Ottie Glazier is Medical Director for [facility] Pulmonary Rehabilitation. ?

## 2022-03-13 ENCOUNTER — Encounter: Payer: Medicare Other | Attending: Pulmonary Disease | Admitting: *Deleted

## 2022-03-13 DIAGNOSIS — Z9989 Dependence on other enabling machines and devices: Secondary | ICD-10-CM | POA: Insufficient documentation

## 2022-03-13 DIAGNOSIS — G4733 Obstructive sleep apnea (adult) (pediatric): Secondary | ICD-10-CM | POA: Insufficient documentation

## 2022-03-13 NOTE — Progress Notes (Signed)
Daily Session Note ? ?Patient Details  ?Name: Andrew Callahan ?MRN: [id number] ?Date of Birth: 1934-01-05 ?Referring Provider:   ?Flowsheet Row Pulmonary Rehab from 12/26/2021 in [facility] Cardiac and Pulmonary Rehab  ?Referring Provider Aleskerov  ? ?  ? ? ?Encounter Date: 03/13/2022 ? ?Check In: ? Session Check In - 03/13/22 1114   ? ?  ? Check-In  ? Supervising physician immediately available to respond to emergencies See telemetry face sheet for immediately available ER MD   ? Location [facility]-Cardiac & Pulmonary Rehab   ? Staff Present Renita Papa, RN Moises Blood, BS, ACSM CEP, Exercise Physiologist;Amanda Oletta Darter, BA, ACSM CEP, Exercise Physiologist;Kelly Rosalia Hammers, MPA, RN   ? Virtual Visit No   ? Medication changes reported     No   ? Fall or balance concerns reported    No   ? Warm-up and Cool-down Performed on first and last piece of equipment   ? Resistance Training Performed Yes   ? VAD Patient? No   ? PAD/SET Patient? No   ?  ? Pain Assessment  ? Currently in Pain? No/denies   ? ?  ?  ? ?  ? ? ? ? ? ?Social History  ? ?Tobacco Use  ?Smoking Status Former  ?Smokeless Tobacco Never  ?Tobacco Comments  ? quit 50 years ago  ? ? ?Goals Met:  ?Independence with exercise equipment ?Exercise tolerated well ?No report of concerns or symptoms today ?Strength training completed today ? ?Goals Unmet:  ?Not Applicable ? ?Comments: Pt able to follow exercise prescription today without complaint.  Will continue to monitor for progression. ? ? ? ?Dr. Emily Filbert is Medical Director for [place].  ?Dr. Ottie Glazier is Medical Director for [facility] Pulmonary Rehabilitation. ?

## 2022-03-14 ENCOUNTER — Ambulatory Visit (INDEPENDENT_AMBULATORY_CARE_PROVIDER_SITE_OTHER): Payer: Medicare Other | Admitting: Urology

## 2022-03-14 DIAGNOSIS — R351 Nocturia: Secondary | ICD-10-CM

## 2022-03-14 DIAGNOSIS — N3281 Overactive bladder: Secondary | ICD-10-CM | POA: Diagnosis not present

## 2022-03-14 NOTE — Progress Notes (Signed)
Virtual Visit via Telephone Note ? ?I connected with Andrew Callahan on 03/14/22 at 11:30 AM EDT by telephone and verified that I am speaking with the correct person using two identifiers. ?  ?Patient location: Home ?Provider location: [facility] ? ? ?I discussed the limitations, risks, security and privacy concerns of performing an evaluation and management service by telephone and the availability of in person appointments. We discussed the impact of the COVID-19 pandemic on the healthcare system, and the importance of social distancing and reducing patient and provider exposure. I also discussed with the patient that there may be a patient responsible charge related to this service. The patient expressed understanding and agreed to proceed. ? ?Reason for visit: ?88 year old male with history of stroke and metastatic prostate cancer managed by oncology who originally presented with urinary retention and elevated PVRs.  With his metastatic prostate cancer he opted for a HOLEP to get out of retention, and systemic treatment for his widely metastatic disease with significant bony mets. ?  ?He has been urinating with a strong stream and emptying his bladder well with low PVRs since that time, but continues to have some frequency during the day every 2-3 hours, and nocturia 3-5 times overnight.  He was having nocturia 3-4 times overnight prior to undergoing HOLEP.  He is not having any incontinence.  He has had a cystoscopy which was normal and showed a wide open prostatic fossa and intact sphincter with no bladder lesions. ?  ?We have tried numerous strategies for his overactive symptoms and nocturia including bladder diaries, anticholinergic OAB medications, both beta 3 agonist Myrbetriq and vibegron, desmopressin overnight, and most recently PTNS.   He also tried a condom catheter overnight but this kept falling off.  He has really had no significant improvement with any of these strategies. ?  ?He also  previously asked about Botox in the bladder, we discussed the risk of retention, and he opted to defer for the time being.  He remains very hesitant about possible side effects from Botox.  I think it would be reasonable to obtain urodynamics prior to pursuing more aggressive treatment with Botox. ? ?Despite our conversation last time about behavioral strategies and bladder irritants, he continues to drink fluids in the evening, including tea in the afternoon, diet soda, and coffee in the morning. ? ?Again, with his metastatic prostate cancer and history of stroke I think we need to have realistic expectations regarding the OAB component of his urinary symptoms, and that his nocturia was present prior to undergoing HOLEP, and he is now no longer catheter dependent and emptying well. ?  ?Follow Up: ?RTC 6 months PVR ?Behavioral strategies discussed extensively ?  ?I discussed the assessment and treatment plan with the patient. The patient was provided an opportunity to ask questions and all were answered. The patient agreed with the plan and demonstrated an understanding of the instructions. ?  ?The patient was advised to call back or seek an in-person evaluation if the symptoms worsen or if the condition fails to improve as anticipated. ? ?I provided 13 minutes of non-face-to-face time during this encounter. ? ? ?Billey Co, MD  ? ?

## 2022-03-15 DIAGNOSIS — G4733 Obstructive sleep apnea (adult) (pediatric): Secondary | ICD-10-CM

## 2022-03-15 NOTE — Progress Notes (Signed)
Daily Session Note ? ?Patient Details  ?Name: Andrew Callahan ?MRN: [id number] ?Date of Birth: March 29, 1934 ?Referring Provider:   ?Flowsheet Row Pulmonary Rehab from 12/26/2021 in [facility] Cardiac and Pulmonary Rehab  ?Referring Provider Aleskerov  ? ?  ? ? ?Encounter Date: 03/15/2022 ? ?Check In: ? Session Check In - 03/15/22 1117   ? ?  ? Check-In  ? Supervising physician immediately available to respond to emergencies See telemetry face sheet for immediately available ER MD   ? Location [facility]-Cardiac & Pulmonary Rehab   ? Staff Present Birdie Sons, MPA, Nino Glow, MS, ASCM CEP, Exercise Physiologist;Melissa [place], RDN, LDN;Joseph [place], RCP,RRT,BSRT   ? Virtual Visit No   ? Medication changes reported     No   ? Fall or balance concerns reported    No   ? Warm-up and Cool-down Performed on first and last piece of equipment   ? Resistance Training Performed Yes   ? VAD Patient? No   ? PAD/SET Patient? No   ?  ? Pain Assessment  ? Currently in Pain? No/denies   ? ?  ?  ? ?  ? ? ? ? ? ?Social History  ? ?Tobacco Use  ?Smoking Status Former  ?Smokeless Tobacco Never  ?Tobacco Comments  ? quit 50 years ago  ? ? ?Goals Met:  ?Independence with exercise equipment ?Exercise tolerated well ?No report of concerns or symptoms today ?Strength training completed today ? ?Goals Unmet:  ?Not Applicable ? ?Comments: Pt able to follow exercise prescription today without complaint.  Will continue to monitor for progression. ? ? ? ?Dr. Emily Filbert is Medical Director for [place].  ?Dr. Ottie Glazier is Medical Director for [facility] Pulmonary Rehabilitation. ?

## 2022-03-20 ENCOUNTER — Encounter: Payer: Medicare Other | Admitting: *Deleted

## 2022-03-20 DIAGNOSIS — G4733 Obstructive sleep apnea (adult) (pediatric): Secondary | ICD-10-CM | POA: Diagnosis not present

## 2022-03-20 NOTE — Progress Notes (Signed)
Daily Session Note ? ?Patient Details  ?Name: Andrew Callahan ?MRN: [id number] ?Date of Birth: 1934/02/23 ?Referring Provider:   ?Flowsheet Row Pulmonary Rehab from 12/26/2021 in [facility] Cardiac and Pulmonary Rehab  ?Referring Provider Aleskerov  ? ?  ? ? ?Encounter Date: 03/20/2022 ? ?Check In: ? Session Check In - 03/20/22 1110   ? ?  ? Check-In  ? Supervising physician immediately available to respond to emergencies See telemetry face sheet for immediately available ER MD   ? Location [facility]-Cardiac & Pulmonary Rehab   ? Staff Present Renita Papa, RN BSN;Laureen Owens Shark, BS, RRT, CPFT;Kelly Amedeo Plenty, BS, ACSM CEP, Exercise Physiologist;Kelly Rosalia Hammers, MPA, RN   ? Virtual Visit No   ? Medication changes reported     No   ? Fall or balance concerns reported    No   ? Warm-up and Cool-down Performed on first and last piece of equipment   ? Resistance Training Performed Yes   ? VAD Patient? No   ? PAD/SET Patient? No   ?  ? Pain Assessment  ? Currently in Pain? No/denies   ? ?  ?  ? ?  ? ? ? ? ? ?Social History  ? ?Tobacco Use  ?Smoking Status Former  ?Smokeless Tobacco Never  ?Tobacco Comments  ? quit 50 years ago  ? ? ?Goals Met:  ?Independence with exercise equipment ?Exercise tolerated well ?No report of concerns or symptoms today ?Strength training completed today ? ?Goals Unmet:  ?Not Applicable ? ?Comments:  Pt able to follow exercise prescription today without complaint.  Will continue to monitor for progression. ? ? ? ? ?Dr. Emily Filbert is Medical Director for [place].  ?Dr. Ottie Glazier is Medical Director for [facility] Pulmonary Rehabilitation. ?

## 2022-03-22 ENCOUNTER — Encounter: Payer: Medicare Other | Admitting: *Deleted

## 2022-03-22 DIAGNOSIS — G4733 Obstructive sleep apnea (adult) (pediatric): Secondary | ICD-10-CM

## 2022-03-22 NOTE — Progress Notes (Signed)
Daily Session Note ? ?Patient Details  ?Name: Rosemary Pentecost ?MRN: [id number] ?Date of Birth: [birth year] ?Referring Provider:   ?Flowsheet Row Pulmonary Rehab from 12/26/2021 in [facility] Cardiac and Pulmonary Rehab  ?Referring Provider Aleskerov  ? ?  ? ? ?Encounter Date: 03/22/2022 ? ?Check In: ? Session Check In - 03/22/22 1118   ? ?  ? Check-In  ? Supervising physician immediately available to respond to emergencies See telemetry face sheet for immediately available ER MD   ? Location [facility]-Cardiac & Pulmonary Rehab   ? Staff Present Renita Papa, RN Vickki Hearing, BA, ACSM CEP, Exercise Physiologist;Melissa [place], RDN, LDN   ? Virtual Visit No   ? Medication changes reported     No   ? Fall or balance concerns reported    No   ? Warm-up and Cool-down Performed on first and last piece of equipment   ? Resistance Training Performed Yes   ? VAD Patient? No   ? PAD/SET Patient? No   ?  ? Pain Assessment  ? Currently in Pain? No/denies   ? ?  ?  ? ?  ? ? ? ? ? ?Social History  ? ?Tobacco Use  ?Smoking Status Former  ?Smokeless Tobacco Never  ?Tobacco Comments  ? quit 50 years ago  ? ? ?Goals Met:  ?Independence with exercise equipment ?Exercise tolerated well ?No report of concerns or symptoms today ?Strength training completed today ? ?Goals Unmet:  ?Not Applicable ? ?Comments: Pt able to follow exercise prescription today without complaint.  Will continue to monitor for progression. ? ? ? ?Dr. Emily Filbert is Medical Director for [place].  ?Dr. Ottie Glazier is Medical Director for [facility] Pulmonary Rehabilitation. ?

## 2022-03-27 ENCOUNTER — Encounter: Payer: Medicare Other | Admitting: *Deleted

## 2022-03-27 DIAGNOSIS — G4733 Obstructive sleep apnea (adult) (pediatric): Secondary | ICD-10-CM | POA: Diagnosis not present

## 2022-03-27 NOTE — Progress Notes (Signed)
Daily Session Note ? ?Patient Details  ?Name: Andrew Callahan ?MRN: [id number] ?Date of Birth: Sep 04, 1934 ?Referring Provider:   ?Flowsheet Row Pulmonary Rehab from 12/26/2021 in [facility] Cardiac and Pulmonary Rehab  ?Referring Provider Aleskerov  ? ?  ? ? ?Encounter Date: 03/27/2022 ? ?Check In: ? Session Check In - 03/27/22 1105   ? ?  ? Check-In  ? Supervising physician immediately available to respond to emergencies See telemetry face sheet for immediately available ER MD   ? Location [facility]-Cardiac & Pulmonary Rehab   ? Staff Present Renita Papa, RN Moises Blood, BS, ACSM CEP, Exercise Physiologist;Amanda Oletta Darter, BA, ACSM CEP, Exercise Physiologist;Kelly Rosalia Hammers, MPA, RN   ? Virtual Visit No   ? Medication changes reported     No   ? Fall or balance concerns reported    No   ? Warm-up and Cool-down Performed on first and last piece of equipment   ? Resistance Training Performed Yes   ? VAD Patient? No   ? PAD/SET Patient? No   ?  ? Pain Assessment  ? Currently in Pain? No/denies   ? ?  ?  ? ?  ? ? ? ? ? ?Social History  ? ?Tobacco Use  ?Smoking Status Former  ?Smokeless Tobacco Never  ?Tobacco Comments  ? quit 50 years ago  ? ? ?Goals Met:  ?Independence with exercise equipment ?Exercise tolerated well ?No report of concerns or symptoms today ?Strength training completed today ? ?Goals Unmet:  ?Not Applicable ? ?Comments: Pt able to follow exercise prescription today without complaint.  Will continue to monitor for progression. ? ? ? ?Dr. Emily Filbert is Medical Director for [place].  ?Dr. Ottie Glazier is Medical Director for [facility] Pulmonary Rehabilitation. ?

## 2022-03-29 ENCOUNTER — Encounter: Payer: Self-pay | Admitting: *Deleted

## 2022-03-29 DIAGNOSIS — G4733 Obstructive sleep apnea (adult) (pediatric): Secondary | ICD-10-CM

## 2022-03-29 NOTE — Progress Notes (Signed)
Pulmonary Individual Treatment Plan ? ?Patient Details  ?Name: Andrew Callahan ?MRN: [id number] ?Date of Birth: Jul 02, 1934 ?Referring Provider:   ?Flowsheet Row Pulmonary Rehab from 12/26/2021 in [facility] Cardiac and Pulmonary Rehab  ?Referring Provider Aleskerov  ? ?  ? ? ?Initial Encounter Date:  ?Flowsheet Row Pulmonary Rehab from 12/26/2021 in [facility] Cardiac and Pulmonary Rehab  ?Date 12/26/21  ? ?  ? ? ?Visit Diagnosis: OSA (obstructive sleep apnea) ? ?Patient's Home Medications on Admission: ? ?Current Outpatient Medications:  ?  abiraterone acetate (ZYTIGA) 250 MG tablet, TAKE 3 TABLETS (750 MG TOTAL) BY MOUTH DAILY. TAKE ON AN EMPTY STOMACH 1 HOUR BEFORE OR 2 HOURS AFTER A MEAL, Disp: 90 tablet, Rfl: 0 ?  ACCU-CHEK GUIDE test strip, daily. use as directed, Disp: , Rfl:  ?  Accu-Chek Softclix Lancets lancets, daily., Disp: , Rfl:  ?  acyclovir ointment (ZOVIRAX) 5 %, Apply 1 application topically every 3 (three) hours. Apply to aa groin prn flares q 3 hours until resolved (Patient not taking: Reported on 03/02/2022), Disp: 30 g, Rfl: 11 ?  amoxicillin (AMOXIL) 500 MG capsule, SMARTSIG:4 Capsule(s) By Mouth (Patient not taking: Reported on 03/02/2022), Disp: , Rfl:  ?  B Complex-C (SUPER B COMPLEX PO), Take 1 capsule by mouth daily. , Disp: , Rfl:  ?  Blood Glucose Monitoring Suppl (ACCU-CHEK GUIDE) w/Device KIT, See admin instructions., Disp: , Rfl:  ?  Calcium-Magnesium-Vitamin D (CALCIUM 1200+D3 PO), Take 2 tablets by mouth in the morning and at bedtime., Disp: , Rfl:  ?  cetirizine (ZYRTEC) 10 MG tablet, Take 10 mg by mouth daily as needed for allergies., Disp: , Rfl:  ?  cetirizine (ZYRTEC) 10 MG tablet, Take 1 tablet by mouth daily., Disp: , Rfl:  ?  denosumab (XGEVA) 120 MG/1.7ML SOLN injection, Inject 120 mg into the skin every 3 (three) months., Disp: , Rfl:  ?  diltiazem (CARDIZEM) 30 MG tablet, Take 30 mg by mouth daily as needed. (Patient not taking: Reported on 03/02/2022), Disp: , Rfl:  ?  ELIQUIS 5 MG TABS  tablet, Take 5 mg by mouth 2 (two) times daily., Disp: , Rfl:  ?  glipiZIDE (GLUCOTROL XL) 10 MG 24 hr tablet, Take 5 mg by mouth daily., Disp: , Rfl:  ?  hydrocortisone (CORTEF) 10 MG tablet, Take 10 mg by mouth 2 (two) times daily. , Disp: , Rfl:  ?  Iron, Ferrous Sulfate, 325 (65 Fe) MG TABS, daily., Disp: , Rfl:  ?  Leuprolide Acetate (ELIGARD [place]), Inject into the skin. 1 injection every 6 months., Disp: , Rfl:  ?  levothyroxine (SYNTHROID) 50 MCG tablet, Take 50 mcg by mouth daily before breakfast., Disp: , Rfl:  ?  levothyroxine (SYNTHROID) 50 MCG tablet, Take by mouth., Disp: , Rfl:  ?  melatonin 5 MG TABS, Take 10 mg by mouth at bedtime. (Patient not taking: Reported on 03/02/2022), Disp: , Rfl:  ?  Misc Natural Products (OSTEO BI-FLEX ADV JOINT SHIELD PO), Take 1 tablet by mouth daily. , Disp: , Rfl:  ?  Multiple Vitamin (MULTIVITAMIN WITH MINERALS) TABS tablet, Take 1 tablet by mouth daily. One-A-Day Men's 50+, Disp: , Rfl:  ?  polyethylene glycol (MIRALAX / GLYCOLAX) 17 g packet, Take 17 g by mouth daily. W/coffee, Disp: , Rfl:  ?  Probiotic Product (ALIGN) 4 MG CAPS, Take 4 mg by mouth daily. , Disp: , Rfl:  ?  triamcinolone cream (KENALOG) 0.1 %, Apply 1 application topically as directed. Qd to bid up  to 5 days per week to right hand until clear, then prn flares, avoid face, groin, axilla, Disp: 80 g, Rfl: 1 ?  valsartan (DIOVAN) 320 MG tablet, Take 160 mg by mouth 2 (two) times daily., Disp: , Rfl:  ? ?Past Medical History: ?Past Medical History:  ?Diagnosis Date  ? Acquired hypothyroidism   ? Aortic atherosclerosis ([place])   ? Atrial fibrillation ([place])   ? Chronic constipation   ? CVA (cerebral vascular accident) [facility])   ? DDD (degenerative disc disease), cervical   ? Diabetes mellitus without complication ([place])   ? Diverticulosis   ? Elevated prostate specific antigen (PSA)   ? Family history of leukemia   ? Family history of lung cancer   ? History of kidney stones   ? History of pituitary adenoma   ?  Hyperlipidemia   ? Hypertension   ? Hypogonadism in male   ? Hypothyroid   ? Hypothyroidism   ? IDA (iron deficiency anemia)   ? Lumbar disc disease   ? Nephrolithiasis   ? 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 Paroxysmal A-fib ([place])   ? Prostate cancer ([place])   ? Sinus bradycardia   ? Sleep apnea   ? uses CPAP  ? Squamous cell carcinoma of skin 2014  ? Right hand. Mohs.  ? Stroke [facility])   ? throat / slight difficulty swallowing  ? ? ?Tobacco Use: ?Social History  ? ?Tobacco Use  ?Smoking Status Former  ?Smokeless Tobacco Never  ?Tobacco Comments  ? quit 50 years ago  ? ? ?Labs: ?Review Flowsheet   ? ?  ?  Latest Ref Rng & Units 11/18/2021  ?Labs for [facility] Cardiac and Pulmonary Rehab  ?Hemoglobin A1c 4.8 - 5.6 % 5.9    ?  ? ? Multiple values from one day are sorted in reverse-chronological order  ?  ?  ? ? ? ?Pulmonary Assessment Scores: ? Pulmonary Assessment Scores   ? ? [place] Name 12/26/21 1604 12/28/21 1119  ?  ?  ? ADL UCSD  ? ADL Phase -- Entry   ? SOB Score total -- 19   ? Rest -- 0   ? Walk -- 0   ? Stairs -- 3   ? Bath -- 0   ? Dress -- 0   ? Shop -- 1   ?  ? CAT Score  ? CAT Score 18 18   ?  ? mMRC Score  ? mMRC Score 1 1   ? ?  ?  ? ?  ?  ?UCSD: ?Self-administered rating of dyspnea associated with activities of daily living (ADLs) ?6-point scale (0 = "not at all" to 5 = "maximal or unable to do because of breathlessness")  ?Scoring Scores range from 0 to 120.  Minimally important difference is 5 units ? ?CAT: ?CAT can identify the health impairment of COPD patients and is better correlated with disease progression.  ?CAT has a scoring range of zero to 40. The CAT score is classified into four groups of low (less than 10), medium (10 - 20), high (21-30) and very high (31-40) based on the impact level of disease on health status. A CAT score over 10 suggests significant symptoms.  A worsening CAT score could be explained by an exacerbation, poor medication adherence, poor  inhaler technique, or progression of COPD or comorbid conditions.  ?CAT MCID is 2 points ? ?mMRC: ?mMRC (Modified Medical Research Council) Dyspnea Scale is used to assess the degree  of baseline functional disability in patients of respiratory disease due to dyspnea. ?No minimal important difference is established. A decrease in score of 1 point or greater is considered a positive change.  ? ?Pulmonary Function Assessment: ? ? ?Exercise Target Goals: ?Exercise Program Goal: ?Individual exercise prescription set using results from initial 6 min walk test and THRR while considering  patient?s activity barriers and safety.  ? ?Exercise Prescription Goal: ?Initial exercise prescription builds to 30-45 minutes a day of aerobic activity, 2-3 days per week.  Home exercise guidelines will be given to patient during program as part of exercise prescription that the participant will acknowledge. ? ?Education: Aerobic Exercise: ?- Group verbal and visual presentation on the components of exercise prescription. Introduces F.I.T.T principle from ACSM for exercise prescriptions.  Reviews F.I.T.T. principles of aerobic exercise including progression. Written material given at graduation. ?Flowsheet Row Pulmonary Rehab from 02/15/2022 in [facility] Cardiac and Pulmonary Rehab  ?Date 02/08/22  ?Educator [facility]  ?Instruction Review Code 1- Verbalizes Understanding  ? ?  ? ? ?Education: Resistance Exercise: ?- Group verbal and visual presentation on the components of exercise prescription. Introduces F.I.T.T principle from ACSM for exercise prescriptions  Reviews F.I.T.T. principles of resistance exercise including progression. Written material given at graduation. ?Flowsheet Row Pulmonary Rehab from 02/15/2022 in [facility] Cardiac and Pulmonary Rehab  ?Date 02/15/22  ?Educator AS  ?Instruction Review Code 1- Verbalizes Understanding  ? ?  ? ?  ?Education: Exercise & Equipment Safety: ?- Individual verbal instruction and demonstration of equipment use  and safety with use of the equipment. ?Flowsheet Row Pulmonary Rehab from 02/15/2022 in [facility] Cardiac and Pulmonary Rehab  ?Date 12/26/21  ?Educator [place]  ?Instruction Review Code 1- Verbalizes Understanding

## 2022-04-03 ENCOUNTER — Encounter: Payer: Medicare Other | Admitting: *Deleted

## 2022-04-03 DIAGNOSIS — G4733 Obstructive sleep apnea (adult) (pediatric): Secondary | ICD-10-CM | POA: Diagnosis not present

## 2022-04-03 NOTE — Progress Notes (Signed)
Daily Session Note ? ?Patient Details  ?Name: Marquavious Nazar ?MRN: [id number] ?Date of Birth: 06-Nov-1934 ?Referring Provider:   ?Flowsheet Row Pulmonary Rehab from 12/26/2021 in [facility] Cardiac and Pulmonary Rehab  ?Referring Provider Aleskerov  ? ?  ? ? ?Encounter Date: 04/03/2022 ? ?Check In: ? Session Check In - 04/03/22 1110   ? ?  ? Check-In  ? Supervising physician immediately available to respond to emergencies See telemetry face sheet for immediately available ER MD   ? Location [facility]-Cardiac & Pulmonary Rehab   ? Staff Present Renita Papa, RN Moises Blood, BS, ACSM CEP, Exercise Physiologist;Kara Eliezer Bottom, MS, ASCM CEP, Exercise Physiologist;Kelly Rosalia Hammers, MPA, RN   ? Virtual Visit No   ? Medication changes reported     No   ? Fall or balance concerns reported    No   ? Warm-up and Cool-down Performed on first and last piece of equipment   ? Resistance Training Performed Yes   ? VAD Patient? No   ? PAD/SET Patient? No   ?  ? Pain Assessment  ? Currently in Pain? No/denies   ? ?  ?  ? ?  ? ? ? ? ? ?Social History  ? ?Tobacco Use  ?Smoking Status Former  ?Smokeless Tobacco Never  ?Tobacco Comments  ? quit 50 years ago  ? ? ?Goals Met:  ?Independence with exercise equipment ?Exercise tolerated well ?No report of concerns or symptoms today ?Strength training completed today ? ?Goals Unmet:  ?Not Applicable ? ?Comments: Pt able to follow exercise prescription today without complaint.  Will continue to monitor for progression. ? ? ? ?Dr. Emily Filbert is Medical Director for [place].  ?Dr. Ottie Glazier is Medical Director for [facility] Pulmonary Rehabilitation. ?

## 2022-04-05 DIAGNOSIS — G4733 Obstructive sleep apnea (adult) (pediatric): Secondary | ICD-10-CM | POA: Diagnosis not present

## 2022-04-05 NOTE — Progress Notes (Signed)
Daily Session Note ? ?Patient Details  ?Name: Andrew Callahan ?MRN: [id number] ?Date of Birth: [birth year] ?Referring Provider:   ?Flowsheet Row Pulmonary Rehab from 12/26/2021 in [facility] Cardiac and Pulmonary Rehab  ?Referring Provider Aleskerov  ? ?  ? ? ?Encounter Date: 04/05/2022 ? ?Check In: ? Session Check In - 04/05/22 1112   ? ?  ? Check-In  ? Supervising physician immediately available to respond to emergencies See telemetry face sheet for immediately available ER MD   ? Location [facility]-Cardiac & Pulmonary Rehab   ? Staff Present Birdie Sons, MPA, RN;Joseph [place], RCP,RRT,BSRT;Melissa [place], RDN, LDN   ? Virtual Visit No   ? Medication changes reported     No   ? Fall or balance concerns reported    No   ? Warm-up and Cool-down Performed on first and last piece of equipment   ? Resistance Training Performed Yes   ? VAD Patient? No   ? PAD/SET Patient? No   ?  ? Pain Assessment  ? Currently in Pain? No/denies   ? ?  ?  ? ?  ? ? ? ? ? ?Social History  ? ?Tobacco Use  ?Smoking Status Former  ?Smokeless Tobacco Never  ?Tobacco Comments  ? quit 50 years ago  ? ? ?Goals Met:  ?Independence with exercise equipment ?Exercise tolerated well ?No report of concerns or symptoms today ?Strength training completed today ? ?Goals Unmet:  ?Not Applicable ? ?Comments: Pt able to follow exercise prescription today without complaint.  Will continue to monitor for progression. ? ? ? ?Dr. Emily Filbert is Medical Director for [place].  ?Dr. Ottie Glazier is Medical Director for [facility] Pulmonary Rehabilitation. ?

## 2022-04-10 ENCOUNTER — Encounter: Payer: Medicare Other | Attending: Pulmonary Disease | Admitting: *Deleted

## 2022-04-10 DIAGNOSIS — Z9989 Dependence on other enabling machines and devices: Secondary | ICD-10-CM | POA: Insufficient documentation

## 2022-04-10 DIAGNOSIS — G4733 Obstructive sleep apnea (adult) (pediatric): Secondary | ICD-10-CM | POA: Diagnosis present

## 2022-04-10 NOTE — Progress Notes (Signed)
Daily Session Note ? ?Patient Details  ?Name: Andrew Callahan ?MRN: [id number] ?Date of Birth: May 14, 1934 ?Referring Provider:   ?Flowsheet Row Pulmonary Rehab from 12/26/2021 in [facility] Cardiac and Pulmonary Rehab  ?Referring Provider Aleskerov  ? ?  ? ? ?Encounter Date: 04/10/2022 ? ?Check In: ? Session Check In - 04/10/22 1129   ? ?  ? Check-In  ? Supervising physician immediately available to respond to emergencies See telemetry face sheet for immediately available ER MD   ? Location [facility]-Cardiac & Pulmonary Rehab   ? Staff Present Heath Lark, RN, BSN, [employer], MPA, RN;Kelly Amedeo Plenty, BS, ACSM CEP, Exercise Physiologist   ? Virtual Visit No   ? Medication changes reported     No   ? Fall or balance concerns reported    No   ? Warm-up and Cool-down Performed on first and last piece of equipment   ? Resistance Training Performed Yes   ? VAD Patient? No   ? PAD/SET Patient? No   ?  ? PAD/SET Patient  ? Completed foot check today? No   ?  ? Pain Assessment  ? Currently in Pain? No/denies   ? ?  ?  ? ?  ? ? ? ? ? ?Social History  ? ?Tobacco Use  ?Smoking Status Former  ?Smokeless Tobacco Never  ?Tobacco Comments  ? quit 50 years ago  ? ? ?Goals Met:  ?Independence with exercise equipment ?Exercise tolerated well ?No report of concerns or symptoms today ? ?Goals Unmet:  ?Not Applicable ? ?Comments: Pt able to follow exercise prescription today without complaint.  Will continue to monitor for progression. ? ? ? ?Dr. Emily Filbert is Medical Director for [place].  ?Dr. Ottie Glazier is Medical Director for [facility] Pulmonary Rehabilitation. ?

## 2022-04-12 DIAGNOSIS — G4733 Obstructive sleep apnea (adult) (pediatric): Secondary | ICD-10-CM | POA: Diagnosis not present

## 2022-04-12 NOTE — Progress Notes (Signed)
Daily Session Note ? ?Patient Details  ?Name: Andrew Callahan ?MRN: [id number] ?Date of Birth: [birth year] ?Referring Provider:   ?Flowsheet Row Pulmonary Rehab from 12/26/2021 in [facility] Cardiac and Pulmonary Rehab  ?Referring Provider Aleskerov  ? ?  ? ? ?Encounter Date: 04/12/2022 ? ?Check In: ? Session Check In - 04/12/22 1105   ? ?  ? Check-In  ? Supervising physician immediately available to respond to emergencies See telemetry face sheet for immediately available ER MD   ? Location [facility]-Cardiac & Pulmonary Rehab   ? Staff Present Birdie Sons, MPA, RN;Melissa [place], RDN, LDN;Joseph [place], RCP,RRT,BSRT   ? Virtual Visit No   ? Medication changes reported     No   ? Fall or balance concerns reported    No   ? Warm-up and Cool-down Performed on first and last piece of equipment   ? Resistance Training Performed Yes   ? VAD Patient? No   ? PAD/SET Patient? No   ?  ? Pain Assessment  ? Currently in Pain? No/denies   ? ?  ?  ? ?  ? ? ? ? ? ?Social History  ? ?Tobacco Use  ?Smoking Status Former  ?Smokeless Tobacco Never  ?Tobacco Comments  ? quit 50 years ago  ? ? ?Goals Met:  ?Independence with exercise equipment ?Exercise tolerated well ?No report of concerns or symptoms today ?Strength training completed today ? ?Goals Unmet:  ?Not Applicable ? ?Comments: Pt able to follow exercise prescription today without complaint.  Will continue to monitor for progression. ? ? ? ?Dr. Emily Filbert is Medical Director for [place].  ?Dr. Ottie Glazier is Medical Director for [facility] Pulmonary Rehabilitation. ?

## 2022-04-17 VITALS — Ht 70.5 in | Wt 236.4 lb

## 2022-04-17 DIAGNOSIS — G4733 Obstructive sleep apnea (adult) (pediatric): Secondary | ICD-10-CM | POA: Diagnosis not present

## 2022-04-17 NOTE — Progress Notes (Signed)
Daily Session Note ? ?Patient Details  ?Name: Andrew Callahan ?MRN: [id number] ?Date of Birth: 08-15-1934 ?Referring Provider:   ?Flowsheet Row Pulmonary Rehab from 12/26/2021 in [facility] Cardiac and Pulmonary Rehab  ?Referring Provider Aleskerov  ? ?  ? ? ?Encounter Date: 04/17/2022 ? ?Check In: ? Session Check In - 04/17/22 1111   ? ?  ? Check-In  ? Supervising physician immediately available to respond to emergencies See telemetry face sheet for immediately available ER MD   ? Location [facility]-Cardiac & Pulmonary Rehab   ? Staff Present Birdie Sons, MPA, RN;Darnette Lampron Amedeo Plenty, BS, ACSM CEP, Exercise Physiologist;Melissa [place], RDN, LDN;Jessica [place], MA, RCEP, CCRP, Marylynn Pearson, MS, ASCM CEP, Exercise Physiologist   ? Virtual Visit No   ? Medication changes reported     No   ? Fall or balance concerns reported    No   ? Warm-up and Cool-down Performed on first and last piece of equipment   ? Resistance Training Performed Yes   ? VAD Patient? No   ? PAD/SET Patient? No   ?  ? Pain Assessment  ? Currently in Pain? No/denies   ? ?  ?  ? ?  ? ? ? ? ? ?Social History  ? ?Tobacco Use  ?Smoking Status Former  ?Smokeless Tobacco Never  ?Tobacco Comments  ? quit 50 years ago  ? ? ?Goals Met:  ?Independence with exercise equipment ?Exercise tolerated well ?No report of concerns or symptoms today ?Strength training completed today ? ?Goals Unmet:  ?Not Applicable ? ?Comments: Pt able to follow exercise prescription today without complaint.  Will continue to monitor for progression. ? ? 6 Minute Walk   ? ? [place] Name 12/26/21 1550 04/17/22 1145  ?  ?  ? 6 Minute Walk  ? Phase Initial Discharge   ? Distance 540 feet 600 feet   ? Distance % Change -- 11.1 %   ? Distance Feet Change -- 60 ft   ? Walk Time 6 minutes 5.32 minutes   ? # of Rest Breaks 0 1  37 sec   ? MPH 1.02 1.14   ? METS 0.87 0.41   ? RPE 13 13   ? Perceived Dyspnea  0 0   ? VO2 Peak -- 1.46   ? Symptoms No Yes (comment)   ? Comments -- fatigue in legs   ? Resting HR 60  bpm 71 bpm   ? Resting BP 170/90 152/78   ? Resting Oxygen Saturation  96 % 98 %   ? Exercise Oxygen Saturation  during 6 min walk 96 % 97 %   ? Max Ex. HR 101 bpm 79 bpm   ? Max Ex. BP 190/80 146/70   ? 2 Minute Post BP 156/82 138/70   ?  ? Interval HR  ? 1 Minute HR 83 76   ? 2 Minute HR 93 76   ? 3 Minute HR 87 79   ? 4 Minute HR 87 71   ? 5 Minute HR 85 76   ? 6 Minute HR 101 72   ? 2 Minute Post HR 69 52   ? Interval Heart Rate? Yes Yes   ?  ? Interval Oxygen  ? Interval Oxygen? Yes Yes   ? Baseline Oxygen Saturation % 96 % 98 %   ? 1 Minute Oxygen Saturation % 96 % 98 %   ? 1 Minute Liters of Oxygen 0 L 0 L  Room Air   ? 2 Minute  Oxygen Saturation % 96 % 97 %   ? 2 Minute Liters of Oxygen 0 L 0 L   ? 3 Minute Oxygen Saturation % 96 % 98 %   ? 3 Minute Liters of Oxygen 0 L 0 L   ? 4 Minute Oxygen Saturation % 96 % 98 %   ? 4 Minute Liters of Oxygen 0 L 0 L   ? 5 Minute Oxygen Saturation % 96 % 98 %   ? 5 Minute Liters of Oxygen 0 L 0 L   ? 6 Minute Oxygen Saturation % 96 % 98 %   ? 6 Minute Liters of Oxygen 0 L 0 L   ? 2 Minute Post Oxygen Saturation % 98 % 98 %   ? 2 Minute Post Liters of Oxygen 0 L 0 L   ? ?  ?  ? ?  ? ? ? ?Dr. Emily Filbert is Medical Director for [place].  ?Dr. Ottie Glazier is Medical Director for [facility] Pulmonary Rehabilitation. ?

## 2022-04-19 DIAGNOSIS — G4733 Obstructive sleep apnea (adult) (pediatric): Secondary | ICD-10-CM | POA: Diagnosis not present

## 2022-04-19 NOTE — Progress Notes (Signed)
Daily Session Note ? ?Patient Details  ?Name: Andrew Callahan ?MRN: [id number] ?Date of Birth: [birth year] ?Referring Provider:   ?Flowsheet Row Pulmonary Rehab from 12/26/2021 in [facility] Cardiac and Pulmonary Rehab  ?Referring Provider Aleskerov  ? ?  ? ? ?Encounter Date: 04/19/2022 ? ?Check In: ? Session Check In - 04/19/22 1119   ? ?  ? Check-In  ? Supervising physician immediately available to respond to emergencies See telemetry face sheet for immediately available ER MD   ? Location [facility]-Cardiac & Pulmonary Rehab   ? Staff Present Birdie Sons, MPA, RN;Melissa [place], RDN, LDN;Joseph [place], RCP,RRT,BSRT   ? Virtual Visit No   ? Medication changes reported     No   ? Fall or balance concerns reported    No   ? Warm-up and Cool-down Performed on first and last piece of equipment   ? Resistance Training Performed Yes   ? VAD Patient? No   ? PAD/SET Patient? No   ?  ? Pain Assessment  ? Currently in Pain? No/denies   ? ?  ?  ? ?  ? ? ? ? ? ?Social History  ? ?Tobacco Use  ?Smoking Status Former  ?Smokeless Tobacco Never  ?Tobacco Comments  ? quit 50 years ago  ? ? ?Goals Met:  ?Independence with exercise equipment ?Exercise tolerated well ?No report of concerns or symptoms today ?Strength training completed today ? ?Goals Unmet:  ?Not Applicable ? ?Comments: Pt able to follow exercise prescription today without complaint.  Will continue to monitor for progression. ? ? ? ?Dr. Emily Filbert is Medical Director for [place].  ?Dr. Ottie Glazier is Medical Director for [facility] Pulmonary Rehabilitation. ?

## 2022-04-24 ENCOUNTER — Telehealth: Payer: Self-pay

## 2022-04-24 ENCOUNTER — Encounter: Payer: Medicare Other | Admitting: *Deleted

## 2022-04-24 DIAGNOSIS — G4733 Obstructive sleep apnea (adult) (pediatric): Secondary | ICD-10-CM | POA: Diagnosis not present

## 2022-04-24 NOTE — Telephone Encounter (Signed)
called patient to check on status of one of his questions on PHQ9. and he states no intentions of hurting himself or feelings that he would be better off dead. ?

## 2022-04-24 NOTE — Progress Notes (Signed)
Daily Session Note ? ?Patient Details  ?Name: Andrew Callahan ?MRN: [id number] ?Date of Birth: 04/14/1934 ?Referring Provider:   ?Flowsheet Row Pulmonary Rehab from 12/26/2021 in [facility] Cardiac and Pulmonary Rehab  ?Referring Provider Aleskerov  ? ?  ? ? ?Encounter Date: 04/24/2022 ? ?Check In: ? Session Check In - 04/24/22 1140   ? ?  ? Check-In  ? Supervising physician immediately available to respond to emergencies See telemetry face sheet for immediately available ER MD   ? Location [facility]-Cardiac & Pulmonary Rehab   ? Staff Present Susanne Bice, RN, BSN, CCRP;Kelly [place], MPA, RN;Kelly [place], BS, ACSM CEP, Exercise Physiologist;Jessica [place], MA, RCEP, CCRP, CCET;Kara [place], MS, ASCM CEP, Exercise Physiologist   ? Virtual Visit No   ? Medication changes reported     No   ? Fall or balance concerns reported    No   ? Warm-up and Cool-down Performed on first and last piece of equipment   ? Resistance Training Performed Yes   ? VAD Patient? No   ?  ? PAD/SET Patient  ? Completed foot check today? No   ? Open wounds to report? No   ?  ? Pain Assessment  ? Currently in Pain? No/denies   ? ?  ?  ? ?  ? ? ? ? ? ?Social History  ? ?Tobacco Use  ?Smoking Status Former  ?Smokeless Tobacco Never  ?Tobacco Comments  ? quit 50 years ago  ? ? ?Goals Met:  ?Proper associated with RPD/PD & O2 Sat ?Independence with exercise equipment ?Using PLB without cueing & demonstrates good technique ?Exercise tolerated well ?No report of concerns or symptoms today ?Strength training completed today ? ?Goals Unmet:  ?Not Applicable ? ?Comments: Pt able to follow exercise prescription today without complaint.  Will continue to monitor for progression. ? ? ? ?Dr. Mark Miller is Medical Director for [facility] Cardiac Rehabilitation.  ?Dr. Fuad Aleskerov is Medical Director for [facility] Pulmonary Rehabilitation. ?

## 2022-04-26 ENCOUNTER — Encounter: Payer: Self-pay | Admitting: *Deleted

## 2022-04-26 DIAGNOSIS — G4733 Obstructive sleep apnea (adult) (pediatric): Secondary | ICD-10-CM

## 2022-04-26 NOTE — Progress Notes (Signed)
Pulmonary Individual Treatment Plan ? ?Patient Details  ?Name: Andrew Callahan ?MRN: [id number] ?Date of Birth: 05/09/1934 ?Referring Provider:   ?Flowsheet Row Pulmonary Rehab from 12/26/2021 in [facility] Cardiac and Pulmonary Rehab  ?Referring Provider Aleskerov  ? ?  ? ? ?Initial Encounter Date:  ?Flowsheet Row Pulmonary Rehab from 12/26/2021 in [facility] Cardiac and Pulmonary Rehab  ?Date 12/26/21  ? ?  ? ? ?Visit Diagnosis: OSA (obstructive sleep apnea) ? ?Patient's Home Medications on Admission: ? ?Current Outpatient Medications:  ?  abiraterone acetate (ZYTIGA) 250 MG tablet, TAKE 3 TABLETS (750 MG TOTAL) BY MOUTH DAILY. TAKE ON AN EMPTY STOMACH 1 HOUR BEFORE OR 2 HOURS AFTER A MEAL, Disp: 90 tablet, Rfl: 0 ?  ACCU-CHEK GUIDE test strip, daily. use as directed, Disp: , Rfl:  ?  Accu-Chek Softclix Lancets lancets, daily., Disp: , Rfl:  ?  acyclovir ointment (ZOVIRAX) 5 %, Apply 1 application topically every 3 (three) hours. Apply to aa groin prn flares q 3 hours until resolved (Patient not taking: Reported on 03/02/2022), Disp: 30 g, Rfl: 11 ?  amoxicillin (AMOXIL) 500 MG capsule, SMARTSIG:4 Capsule(s) By Mouth (Patient not taking: Reported on 03/02/2022), Disp: , Rfl:  ?  B Complex-C (SUPER B COMPLEX PO), Take 1 capsule by mouth daily. , Disp: , Rfl:  ?  Blood Glucose Monitoring Suppl (ACCU-CHEK GUIDE) w/Device KIT, See admin instructions., Disp: , Rfl:  ?  Calcium-Magnesium-Vitamin D (CALCIUM 1200+D3 PO), Take 2 tablets by mouth in the morning and at bedtime., Disp: , Rfl:  ?  cetirizine (ZYRTEC) 10 MG tablet, Take 10 mg by mouth daily as needed for allergies., Disp: , Rfl:  ?  cetirizine (ZYRTEC) 10 MG tablet, Take 1 tablet by mouth daily., Disp: , Rfl:  ?  denosumab (XGEVA) 120 MG/1.7ML SOLN injection, Inject 120 mg into the skin every 3 (three) months., Disp: , Rfl:  ?  diltiazem (CARDIZEM) 30 MG tablet, Take 30 mg by mouth daily as needed. (Patient not taking: Reported on 03/02/2022), Disp: , Rfl:  ?  ELIQUIS 5 MG TABS  tablet, Take 5 mg by mouth 2 (two) times daily., Disp: , Rfl:  ?  glipiZIDE (GLUCOTROL XL) 10 MG 24 hr tablet, Take 5 mg by mouth daily., Disp: , Rfl:  ?  hydrocortisone (CORTEF) 10 MG tablet, Take 10 mg by mouth 2 (two) times daily. , Disp: , Rfl:  ?  Iron, Ferrous Sulfate, 325 (65 Fe) MG TABS, daily., Disp: , Rfl:  ?  Leuprolide Acetate (ELIGARD [place]), Inject into the skin. 1 injection every 6 months., Disp: , Rfl:  ?  levothyroxine (SYNTHROID) 50 MCG tablet, Take 50 mcg by mouth daily before breakfast., Disp: , Rfl:  ?  levothyroxine (SYNTHROID) 50 MCG tablet, Take by mouth., Disp: , Rfl:  ?  melatonin 5 MG TABS, Take 10 mg by mouth at bedtime. (Patient not taking: Reported on 03/02/2022), Disp: , Rfl:  ?  Misc Natural Products (OSTEO BI-FLEX ADV JOINT SHIELD PO), Take 1 tablet by mouth daily. , Disp: , Rfl:  ?  Multiple Vitamin (MULTIVITAMIN WITH MINERALS) TABS tablet, Take 1 tablet by mouth daily. One-A-Day Men's 50+, Disp: , Rfl:  ?  polyethylene glycol (MIRALAX / GLYCOLAX) 17 g packet, Take 17 g by mouth daily. W/coffee, Disp: , Rfl:  ?  Probiotic Product (ALIGN) 4 MG CAPS, Take 4 mg by mouth daily. , Disp: , Rfl:  ?  triamcinolone cream (KENALOG) 0.1 %, Apply 1 application topically as directed. Qd to bid up  to 5 days per week to right hand until clear, then prn flares, avoid face, groin, axilla, Disp: 80 g, Rfl: 1 ?  valsartan (DIOVAN) 320 MG tablet, Take 160 mg by mouth 2 (two) times daily., Disp: , Rfl:  ? ?Past Medical History: ?Past Medical History:  ?Diagnosis Date  ? Acquired hypothyroidism   ? Aortic atherosclerosis ([place])   ? Atrial fibrillation ([place])   ? Chronic constipation   ? CVA (cerebral vascular accident) [facility])   ? DDD (degenerative disc disease), cervical   ? Diabetes mellitus without complication ([place])   ? Diverticulosis   ? Elevated prostate specific antigen (PSA)   ? Family history of leukemia   ? Family history of lung cancer   ? History of kidney stones   ? History of pituitary adenoma   ?  Hyperlipidemia   ? Hypertension   ? Hypogonadism in male   ? Hypothyroid   ? Hypothyroidism   ? IDA (iron deficiency anemia)   ? Lumbar disc disease   ? Nephrolithiasis   ? 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 Paroxysmal A-fib ([place])   ? Prostate cancer ([place])   ? Sinus bradycardia   ? Sleep apnea   ? uses CPAP  ? Squamous cell carcinoma of skin 2014  ? Right hand. Mohs.  ? Stroke [facility])   ? throat / slight difficulty swallowing  ? ? ?Tobacco Use: ?Social History  ? ?Tobacco Use  ?Smoking Status Former  ?Smokeless Tobacco Never  ?Tobacco Comments  ? quit 50 years ago  ? ? ?Labs: ?Review Flowsheet   ? ?  ?  Latest Ref Rng & Units 11/18/2021  ?Labs for ITP Cardiac and Pulmonary Rehab  ?Hemoglobin A1c 4.8 - 5.6 % 5.9    ?  ?  ?  ? ? ? ?Pulmonary Assessment Scores: ? Pulmonary Assessment Scores   ? ? [place] Name 12/26/21 1604 12/28/21 1119 04/17/22 1153  ?  ? ADL UCSD  ? ADL Phase -- Entry Exit  ? SOB Score total -- 19 --  ? Rest -- 0 --  ? Walk -- 0 --  ? Stairs -- 3 --  ? [place] -- 0 --  ? Dress -- 0 --  ? Shop -- 1 --  ?  ? CAT Score  ? CAT Score 18 18 --  ?  ? mMRC Score  ? mMRC Score '1 1 2  ' ? ? [place] Name 04/24/22 1354  ?  ?  ?  ? ADL UCSD  ? ADL Phase Exit    ? SOB Score total 51    ? Rest 1    ? Walk 1    ? Stairs 3    ? [place] 1    ? Dress 4    ? Shop 4    ?  ? CAT Score  ? CAT Score 18    ?  ? mMRC Score  ? mMRC Score 2    ? ?  ?  ? ?  ?  ?UCSD: ?Self-administered rating of dyspnea associated with activities of daily living (ADLs) ?6-point scale (0 = "not at all" to 5 = "maximal or unable to do because of breathlessness")  ?Scoring Scores range from 0 to 120.  Minimally important difference is 5 units ? ?CAT: ?CAT can identify the health impairment of COPD patients and is better correlated with disease progression.  ?CAT has a scoring range of zero to 40. The CAT score is classified into  four groups of low (less than 10), medium (10 - 20), high (21-30) and very high (31-40) based  on the impact level of disease on health status. A CAT score over 10 suggests significant symptoms.  A worsening CAT score could be explained by an exacerbation, poor medication adherence, poor inhaler technique, or progression of COPD or comorbid conditions.  ?CAT MCID is 2 points ? ?mMRC: ?mMRC (Modified Medical Research Council) Dyspnea Scale is used to assess the degree of baseline functional disability in patients of respiratory disease due to dyspnea. ?No minimal important difference is established. A decrease in score of 1 point or greater is considered a positive change.  ? ?Pulmonary Function Assessment: ? ? ?Exercise Target Goals: ?Exercise Program Goal: ?Individual exercise prescription set using results from initial 6 min walk test and THRR while considering  patient?s activity barriers and safety.  ? ?Exercise Prescription Goal: ?Initial exercise prescription builds to 30-45 minutes a day of aerobic activity, 2-3 days per week.  Home exercise guidelines will be given to patient during program as part of exercise prescription that the participant will acknowledge. ? ?Education: Aerobic Exercise: ?- Group verbal and visual presentation on the components of exercise prescription. Introduces F.I.T.T principle from ACSM for exercise prescriptions.  Reviews F.I.T.T. principles of aerobic exercise including progression. Written material given at graduation. ?Flowsheet Row Pulmonary Rehab from 02/15/2022 in [facility] Cardiac and Pulmonary Rehab  ?Date 02/08/22  ?Educator [facility]  ?Instruction Review Code 1- Verbalizes Understanding  ? ?  ? ? ?Education: Resistance Exercise: ?- Group verbal and visual presentation on the components of exercise prescription. Introduces F.I.T.T principle from ACSM for exercise prescriptions  Reviews F.I.T.T. principles of resistance exercise including progression. Written material given at graduation. ?Flowsheet Row Pulmonary Rehab from 02/15/2022 in [facility] Cardiac and Pulmonary Rehab  ?Date  02/15/22  ?Educator AS  ?Instruction Review Code 1- Verbalizes Understanding  ? ?  ? ?  ?Education: Exercise & Equipment Safety: ?- Individual verbal instruction and demonstration of equipment use and safety

## 2022-04-26 NOTE — Progress Notes (Signed)
Daily Session Note ? ?Patient Details  ?Name: Andrew Callahan ?MRN: [id number] ?Date of Birth: December 31, 1933 ?Referring Provider:   ?Flowsheet Row Pulmonary Rehab from 12/26/2021 in [facility] Cardiac and Pulmonary Rehab  ?Referring Provider Aleskerov  ? ?  ? ? ?Encounter Date: 04/26/2022 ? ?Check In: ? Session Check In - 04/26/22 1112   ? ?  ? Check-In  ? Supervising physician immediately available to respond to emergencies See telemetry face sheet for immediately available ER MD   ? Location [facility]-Cardiac & Pulmonary Rehab   ? Staff Present Birdie Sons, MPA, RN;Melissa Birdsong, RDN, LDN;Joseph [place], RCP,RRT,BSRT;Heath Lark, RN, BSN, CCRP   ? Virtual Visit No   ? Medication changes reported     No   ? Fall or balance concerns reported    No   ? Warm-up and Cool-down Performed on first and last piece of equipment   ? Resistance Training Performed Yes   ? VAD Patient? No   ? PAD/SET Patient? No   ?  ? Pain Assessment  ? Currently in Pain? No/denies   ? ?  ?  ? ?  ? ? ? ? ? ?Social History  ? ?Tobacco Use  ?Smoking Status Former  ?Smokeless Tobacco Never  ?Tobacco Comments  ? quit 50 years ago  ? ? ?Goals Met:  ?Independence with exercise equipment ?Exercise tolerated well ?No report of concerns or symptoms today ?Strength training completed today ? ?Goals Unmet:  ?Not Applicable ? ?Comments: Pt able to follow exercise prescription today without complaint.  Will continue to monitor for progression. ? ? ? ?Dr. Emily Filbert is Medical Director for [place].  ?Dr. Ottie Glazier is Medical Director for [facility] Pulmonary Rehabilitation. ?

## 2022-04-27 NOTE — Patient Instructions (Signed)
Discharge Patient Instructions  Patient Details  Name: Andrew Callahan MRN: [id number] Date of Birth: [birth year] Referring Provider:  Ottie Glazier, MD   Number of Visits: 46  Reason for Discharge:  Patient reached a stable level of exercise. Patient independent in their exercise. Patient has met program and personal goals.  Smoking History:  Social History   Tobacco Use  Smoking Status Former  Smokeless Tobacco Never  Tobacco Comments   quit 50 years ago    Diagnosis:  OSA (obstructive sleep apnea)  Initial Exercise Prescription:  Initial Exercise Prescription - 12/26/21 1500       Date of Initial Exercise RX and Referring Provider   Date 12/26/21    Referring Provider Aleskerov      Oxygen   Maintain Oxygen Saturation 88% or higher      Treadmill   MPH 0.8    Grade 0    Minutes 15    METs 1      Recumbant Bike   Level 1    RPM 60    Minutes 15    METs 1      NuStep   Level 1    SPM 80    Minutes 15    METs 1      Prescription Details   Frequency (times per week) 2    Duration Progress to 30 minutes of continuous aerobic without signs/symptoms of physical distress      Intensity   THRR 40-80% of Max Heartrate 90-120    Ratings of Perceived Exertion 11-13    Perceived Dyspnea 0-4      Progression   Progression Continue to progress workloads to maintain intensity without signs/symptoms of physical distress.      Resistance Training   Training Prescription Yes    Weight 4.0    Reps 10-15             Discharge Exercise Prescription (Final Exercise Prescription Changes):  Exercise Prescription Changes - 04/19/22 1600       Response to Exercise   Blood Pressure (Admit) 136/70    Blood Pressure (Exit) 124/60    Heart Rate (Admit) 57 bpm    Heart Rate (Exercise) 74 bpm    Heart Rate (Exit) 57 bpm    Oxygen Saturation (Admit) 99 %    Oxygen Saturation (Exercise) 97 %    Oxygen Saturation (Exit) 98 %    Rating of Perceived Exertion  (Exercise) 12    Perceived Dyspnea (Exercise) 0    Symptoms none    Duration Continue with 30 min of aerobic exercise without signs/symptoms of physical distress.    Intensity THRR unchanged      Progression   Progression Continue to progress workloads to maintain intensity without signs/symptoms of physical distress.    Average METs 1.55      Resistance Training   Training Prescription Yes    Weight 4 lb    Reps 10-15      Interval Training   Interval Training No      NuStep   Level 5    Minutes 30    METs 1.8      Home Exercise Plan   Plans to continue exercise at [employer] (comment)   [employer]   Frequency Add 1 additional day to program exercise sessions.    Initial Home Exercises Provided 02/08/22      Oxygen   Maintain Oxygen Saturation 88% or higher  Functional Capacity:  6 Minute Walk     Row Name 12/26/21 1550 04/17/22 1145       6 Minute Walk   Phase Initial Discharge    Distance 540 feet 600 feet    Distance % Change -- 11.1 %    Distance Feet Change -- 60 ft    Walk Time 6 minutes 5.32 minutes    # of Rest Breaks 0 1  37 sec    MPH 1.02 1.14    METS 0.87 0.41    RPE 13 13    Perceived Dyspnea  0 0    VO2 Peak -- 1.46    Symptoms No Yes (comment)    Comments -- fatigue in legs    Resting HR 60 bpm 71 bpm    Resting BP 170/90 152/78    Resting Oxygen Saturation  96 % 98 %    Exercise Oxygen Saturation  during 6 min walk 96 % 97 %    Max Ex. HR 101 bpm 79 bpm    Max Ex. BP 190/80 146/70    2 Minute Post BP 156/82 138/70      Interval HR   1 Minute HR 83 76    2 Minute HR 93 76    3 Minute HR 87 79    4 Minute HR 87 71    5 Minute HR 85 76    6 Minute HR 101 72    2 Minute Post HR 69 52    Interval Heart Rate? Yes Yes      Interval Oxygen   Interval Oxygen? Yes Yes    Baseline Oxygen Saturation % 96 % 98 %    1 Minute Oxygen Saturation % 96 % 98 %    1 Minute Liters of Oxygen 0 L 0 L  Room Air    2 Minute  Oxygen Saturation % 96 % 97 %    2 Minute Liters of Oxygen 0 L 0 L    3 Minute Oxygen Saturation % 96 % 98 %    3 Minute Liters of Oxygen 0 L 0 L    4 Minute Oxygen Saturation % 96 % 98 %    4 Minute Liters of Oxygen 0 L 0 L    5 Minute Oxygen Saturation % 96 % 98 %    5 Minute Liters of Oxygen 0 L 0 L    6 Minute Oxygen Saturation % 96 % 98 %    6 Minute Liters of Oxygen 0 L 0 L    2 Minute Post Oxygen Saturation % 98 % 98 %    2 Minute Post Liters of Oxygen 0 L 0 L               Nutrition & Weight - Outcomes:  Pre Biometrics - 12/26/21 1554       Pre Biometrics   Height 5' 10.5" (1.791 m)    Weight 238 lb 4.8 oz (108.1 kg)    BMI (Calculated) 33.7             Post Biometrics - 04/17/22 1147        Post  Biometrics   Height 5' 10.5" (1.791 m)    Weight 236 lb 6.4 oz (107.2 kg)    BMI (Calculated) 33.43             Nutrition:  Nutrition Therapy & Goals - 01/23/22 1142       Nutrition Therapy  RD appointment deferred Yes              Goals reviewed with patient; copy given to patient.

## 2022-05-01 ENCOUNTER — Encounter: Payer: Medicare Other | Admitting: *Deleted

## 2022-05-01 DIAGNOSIS — G4733 Obstructive sleep apnea (adult) (pediatric): Secondary | ICD-10-CM

## 2022-05-01 NOTE — Progress Notes (Signed)
Daily Session Note  Patient Details  Name: Andrew Callahan MRN: [id number] Date of Birth: Jun 21, 1934 Referring Provider:   Flowsheet Row Pulmonary Rehab from 12/26/2021 in [facility] Cardiac and Pulmonary Rehab  Referring Provider Lanney Gins       Encounter Date: 05/01/2022  Check In:  Session Check In - 05/01/22 1146       Check-In   Supervising physician immediately available to respond to emergencies See telemetry face sheet for immediately available ER MD    Location [facility]-Cardiac & Pulmonary Rehab    Staff Present Heath Lark, RN, BSN, [employer], MPA, [place], BS, ACSM CEP, Exercise Physiologist    Virtual Visit No    Medication changes reported     No    Fall or balance concerns reported    No    Warm-up and Cool-down Performed on first and last piece of equipment    Resistance Training Performed Yes    VAD Patient? No    PAD/SET Patient? No      Pain Assessment   Currently in Pain? No/denies                Social History   Tobacco Use  Smoking Status Former  Smokeless Tobacco Never  Tobacco Comments   quit 50 years ago    Goals Met:  Proper associated with RPD/PD & O2 Sat Independence with exercise equipment Exercise tolerated well No report of concerns or symptoms today  Goals Unmet:  Not Applicable  Comments: Pt able to follow exercise prescription today without complaint.  Will continue to monitor for progression.    Dr. Emily Filbert is Medical Director for [place].  Dr. Ottie Glazier is Medical Director for [facility] Pulmonary Rehabilitation.

## 2022-05-03 DIAGNOSIS — G4733 Obstructive sleep apnea (adult) (pediatric): Secondary | ICD-10-CM

## 2022-05-03 NOTE — Progress Notes (Signed)
Pulmonary Individual Treatment Plan  Patient Details  Name: Andrew Callahan MRN: [id number] Date of Birth: 12/07/1934 Referring Provider:   Flowsheet Row Pulmonary Rehab from 12/26/2021 in [facility] Cardiac and Pulmonary Rehab  Referring Provider Lanney Gins       Initial Encounter Date:  Flowsheet Row Pulmonary Rehab from 12/26/2021 in [facility] Cardiac and Pulmonary Rehab  Date 12/26/21       Visit Diagnosis: OSA (obstructive sleep apnea)  Patient's Home Medications on Admission:  Current Outpatient Medications:    abiraterone acetate (ZYTIGA) 250 MG tablet, TAKE 3 TABLETS (750 MG TOTAL) BY MOUTH DAILY. TAKE ON AN EMPTY STOMACH 1 HOUR BEFORE OR 2 HOURS AFTER A MEAL, Disp: 90 tablet, Rfl: 0   ACCU-CHEK GUIDE test strip, daily. use as directed, Disp: , Rfl:    Accu-Chek Softclix Lancets lancets, daily., Disp: , Rfl:    acyclovir ointment (ZOVIRAX) 5 %, Apply 1 application topically every 3 (three) hours. Apply to aa groin prn flares q 3 hours until resolved (Patient not taking: Reported on 03/02/2022), Disp: 30 g, Rfl: 11   amoxicillin (AMOXIL) 500 MG capsule, SMARTSIG:4 Capsule(s) By Mouth (Patient not taking: Reported on 03/02/2022), Disp: , Rfl:    B Complex-C (SUPER B COMPLEX PO), Take 1 capsule by mouth daily. , Disp: , Rfl:    Blood Glucose Monitoring Suppl (ACCU-CHEK GUIDE) w/Device KIT, See admin instructions., Disp: , Rfl:    Calcium-Magnesium-Vitamin D (CALCIUM 1200+D3 PO), Take 2 tablets by mouth in the morning and at bedtime., Disp: , Rfl:    cetirizine (ZYRTEC) 10 MG tablet, Take 10 mg by mouth daily as needed for allergies., Disp: , Rfl:    cetirizine (ZYRTEC) 10 MG tablet, Take 1 tablet by mouth daily., Disp: , Rfl:    denosumab (XGEVA) 120 MG/1.7ML SOLN injection, Inject 120 mg into the skin every 3 (three) months., Disp: , Rfl:    diltiazem (CARDIZEM) 30 MG tablet, Take 30 mg by mouth daily as needed. (Patient not taking: Reported on 03/02/2022), Disp: , Rfl:    ELIQUIS 5 MG TABS  tablet, Take 5 mg by mouth 2 (two) times daily., Disp: , Rfl:    glipiZIDE (GLUCOTROL XL) 10 MG 24 hr tablet, Take 5 mg by mouth daily., Disp: , Rfl:    hydrocortisone (CORTEF) 10 MG tablet, Take 10 mg by mouth 2 (two) times daily. , Disp: , Rfl:    Iron, Ferrous Sulfate, 325 (65 Fe) MG TABS, daily., Disp: , Rfl:    Leuprolide Acetate (ELIGARD [place]), Inject into the skin. 1 injection every 6 months., Disp: , Rfl:    levothyroxine (SYNTHROID) 50 MCG tablet, Take 50 mcg by mouth daily before breakfast., Disp: , Rfl:    levothyroxine (SYNTHROID) 50 MCG tablet, Take by mouth., Disp: , Rfl:    melatonin 5 MG TABS, Take 10 mg by mouth at bedtime. (Patient not taking: Reported on 03/02/2022), Disp: , Rfl:    Misc Natural Products (OSTEO BI-FLEX ADV JOINT SHIELD PO), Take 1 tablet by mouth daily. , Disp: , Rfl:    Multiple Vitamin (MULTIVITAMIN WITH MINERALS) TABS tablet, Take 1 tablet by mouth daily. One-A-Day Men's 50+, Disp: , Rfl:    polyethylene glycol (MIRALAX / GLYCOLAX) 17 g packet, Take 17 g by mouth daily. W/coffee, Disp: , Rfl:    Probiotic Product (ALIGN) 4 MG CAPS, Take 4 mg by mouth daily. , Disp: , Rfl:    triamcinolone cream (KENALOG) 0.1 %, Apply 1 application topically as directed. Qd to bid up  to 5 days per week to right hand until clear, then prn flares, avoid face, groin, axilla, Disp: 80 g, Rfl: 1   valsartan (DIOVAN) 320 MG tablet, Take 160 mg by mouth 2 (two) times daily., Disp: , Rfl:   Past Medical History: Past Medical History:  Diagnosis Date   Acquired hypothyroidism    Aortic atherosclerosis ([place])    Atrial fibrillation ([place])    Chronic constipation    CVA (cerebral vascular accident) ([place])    DDD (degenerative disc disease), cervical    Diabetes mellitus without complication ([place])    Diverticulosis    Elevated prostate specific antigen (PSA)    Family history of leukemia    Family history of lung cancer    History of kidney stones    History of pituitary adenoma     Hyperlipidemia    Hypertension    Hypogonadism in male    Hypothyroid    Hypothyroidism    IDA (iron deficiency anemia)    Lumbar disc disease    Nephrolithiasis    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 Mohs.   Stroke ([place])    throat / slight difficulty swallowing    Tobacco Use: Social History   Tobacco Use  Smoking Status Former  Smokeless Tobacco Never  Tobacco Comments   quit 50 years ago    Labs: Review Flowsheet        Latest Ref Rng & Units 11/18/2021  Labs for ITP Cardiac and Pulmonary Rehab  Hemoglobin A1c 4.8 - 5.6 % 5.9             Pulmonary Assessment Scores:  Pulmonary Assessment Scores     Row Name 12/26/21 1604 12/28/21 1119 04/17/22 1153     ADL UCSD   ADL Phase -- Entry Exit   SOB Score total -- 19 --   Rest -- 0 --   Walk -- 0 --   Stairs -- 3 --   Bath -- 0 --   Dress -- 0 --   Shop -- 1 --     CAT Score   CAT Score 18 18 --     mMRC Score   mMRC Score '1 1 2    ' Row Name 04/24/22 1354         ADL UCSD   ADL Phase Exit     SOB Score total 51     Rest 1     Walk 1     Stairs 3     Bath 1     Dress 4     Shop 4       CAT Score   CAT Score 18       mMRC Score   mMRC Score 2              UCSD: Self-administered rating of dyspnea associated with activities of daily living (ADLs) 6-point scale (0 = "not at all" to 5 = "maximal or unable to do because of breathlessness")  Scoring Scores range from 0 to 120.  Minimally important difference is 5 units  CAT: CAT can identify the health impairment of COPD patients and is better correlated with disease progression.  CAT has a scoring range of zero to 40. The CAT score is classified into  four groups of low (less than 10), medium (10 - 20), high (21-30) and very high (31-40) based  on the impact level of disease on health status. A CAT score over 10 suggests significant symptoms.  A worsening CAT score could be explained by an exacerbation, poor medication adherence, poor inhaler technique, or progression of COPD or comorbid conditions.  CAT MCID is 2 points  mMRC: mMRC (Modified Medical Research Council) Dyspnea Scale is used to assess the degree of baseline functional disability in patients of respiratory disease due to dyspnea. No minimal important difference is established. A decrease in score of 1 point or greater is considered a positive change.   Pulmonary Function Assessment:   Exercise Target Goals: Exercise Program Goal: Individual exercise prescription set using results from initial 6 min walk test and THRR while considering  patient's activity barriers and safety.   Exercise Prescription Goal: Initial exercise prescription builds to 30-45 minutes a day of aerobic activity, 2-3 days per week.  Home exercise guidelines will be given to patient during program as part of exercise prescription that the participant will acknowledge.  Education: Aerobic Exercise: - Group verbal and visual presentation on the components of exercise prescription. Introduces F.I.T.T principle from ACSM for exercise prescriptions.  Reviews F.I.T.T. principles of aerobic exercise including progression. Written material given at graduation. Flowsheet Row Pulmonary Rehab from 02/15/2022 in [facility] Cardiac and Pulmonary Rehab  Date 02/08/22  Educator [facility]  Instruction Review Code 1- Verbalizes Understanding       Education: Resistance Exercise: - Group verbal and visual presentation on the components of exercise prescription. Introduces F.I.T.T principle from ACSM for exercise prescriptions  Reviews F.I.T.T. principles of resistance exercise including progression. Written material given at graduation. Flowsheet Row Pulmonary Rehab from 02/15/2022 in [facility] Cardiac and Pulmonary Rehab  Date  02/15/22  Educator [facility]  Instruction Review Code 1- Verbalizes Understanding        Education: Exercise & Equipment Safety: - Individual verbal instruction and demonstration of equipment use and safety with use of the equipment. Flowsheet Row Pulmonary Rehab from 02/15/2022 in [facility] Cardiac and Pulmonary Rehab  Date 12/26/21  Educator [facility]  Instruction Review Code 1- Verbalizes Understanding       Education: Exercise Physiology & General Exercise Guidelines: - Group verbal and written instruction with models to review the exercise physiology of the cardiovascular system and associated critical values. Provides general exercise guidelines with specific guidelines to those with heart or lung disease.  Flowsheet Row Pulmonary Rehab from 02/15/2022 in [facility] Cardiac and Pulmonary Rehab  Date 02/01/22  Educator [facility]  Instruction Review Code 1- Verbalizes Understanding       Education: Flexibility, Balance, Mind/Body Relaxation: - Group verbal and visual presentation with interactive activity on the components of exercise prescription. Introduces F.I.T.T principle from ACSM for exercise prescriptions. Reviews F.I.T.T. principles of flexibility and balance exercise training including progression. Also discusses the mind body connection.  Reviews various relaxation techniques to help reduce and manage stress (i.e. Deep breathing, progressive muscle relaxation, and visualization). Balance handout provided to take home. Written material given at graduation.   Activity Barriers & Risk Stratification:  Activity Barriers & Cardiac Risk Stratification - 11/14/21 1402       Activity Barriers & Cardiac Risk Stratification   Activity Barriers Balance Concerns;Muscular Weakness   history of stroke; metastatic prostate cancer (overactive bladder)            6 Minute Walk:  6 Minute Walk     Row Name  12/26/21 1550 04/17/22 1145       6 Minute Walk   Phase Initial Discharge    Distance 540 feet 600  feet    Distance % Change -- 11.1 %    Distance Feet Change -- 60 ft    Walk Time 6 minutes 5.32 minutes    # of Rest Breaks 0 1  37 sec    MPH 1.02 1.14    METS 0.87 0.41    RPE 13 13    Perceived Dyspnea  0 0    VO2 Peak -- 1.46    Symptoms No Yes (comment)    Comments -- fatigue in legs    Resting HR 60 bpm 71 bpm    Resting BP 170/90 152/78    Resting Oxygen Saturation  96 % 98 %    Exercise Oxygen Saturation  during 6 min walk 96 % 97 %    Max Ex. HR 101 bpm 79 bpm    Max Ex. BP 190/80 146/70    2 Minute Post BP 156/82 138/70      Interval HR   1 Minute HR 83 76    2 Minute HR 93 76    3 Minute HR 87 79    4 Minute HR 87 71    5 Minute HR 85 76    6 Minute HR 101 72    2 Minute Post HR 69 52    Interval Heart Rate? Yes Yes      Interval Oxygen   Interval Oxygen? Yes Yes    Baseline Oxygen Saturation % 96 % 98 %    1 Minute Oxygen Saturation % 96 % 98 %    1 Minute Liters of Oxygen 0 L 0 L  Room Air    2 Minute Oxygen Saturation % 96 % 97 %    2 Minute Liters of Oxygen 0 L 0 L    3 Minute Oxygen Saturation % 96 % 98 %    3 Minute Liters of Oxygen 0 L 0 L    4 Minute Oxygen Saturation % 96 % 98 %    4 Minute Liters of Oxygen 0 L 0 L    5 Minute Oxygen Saturation % 96 % 98 %    5 Minute Liters of Oxygen 0 L 0 L    6 Minute Oxygen Saturation % 96 % 98 %    6 Minute Liters of Oxygen 0 L 0 L    2 Minute Post Oxygen Saturation % 98 % 98 %    2 Minute Post Liters of Oxygen 0 L 0 L            Oxygen Initial Assessment:  Oxygen Initial Assessment - 11/14/21 1405       Home Oxygen   Home Oxygen Device None    Sleep Oxygen Prescription CPAP    Compliance with Home Oxygen Use Yes      Initial 6 min Walk   Oxygen Used None      Program Oxygen Prescription   Program Oxygen Prescription None      Intervention   Short Term Goals To learn and understand importance of monitoring SPO2 with pulse oximeter and demonstrate accurate use of the pulse oximeter.;To  learn and understand importance of maintaining oxygen saturations>88%;To learn and demonstrate proper pursed lip breathing techniques or other breathing techniques. ;To learn and demonstrate proper use of respiratory medications    Long  Term Goals Verbalizes importance of  monitoring SPO2 with pulse oximeter and return demonstration;Maintenance of O2 saturations>88%;Compliance with respiratory medication;Exhibits proper breathing techniques, such as pursed lip breathing or other method taught during program session;Demonstrates proper use of MDI's             Oxygen Re-Evaluation:  Oxygen Re-Evaluation     Row Name 12/28/21 1029 01/23/22 1144 04/17/22 1152         Program Oxygen Prescription   Program Oxygen Prescription None None None       Home Oxygen   Home Oxygen Device None None None     Sleep Oxygen Prescription CPAP CPAP CPAP     Home Exercise Oxygen Prescription -- None None     Home Resting Oxygen Prescription -- None None     Compliance with Home Oxygen Use Yes Yes Yes       Goals/Expected Outcomes   Short Term Goals To learn and understand importance of monitoring SPO2 with pulse oximeter and demonstrate accurate use of the pulse oximeter.;To learn and understand importance of maintaining oxygen saturations>88%;To learn and demonstrate proper pursed lip breathing techniques or other breathing techniques.  To learn and understand importance of monitoring SPO2 with pulse oximeter and demonstrate accurate use of the pulse oximeter.;To learn and understand importance of maintaining oxygen saturations>88%;To learn and demonstrate proper pursed lip breathing techniques or other breathing techniques.  To learn and understand importance of monitoring SPO2 with pulse oximeter and demonstrate accurate use of the pulse oximeter.;To learn and understand importance of maintaining oxygen saturations>88%;To learn and demonstrate proper pursed lip breathing techniques or other breathing  techniques.      Long  Term Goals Verbalizes importance of monitoring SPO2 with pulse oximeter and return demonstration;Maintenance of O2 saturations>88%;Compliance with respiratory medication;Exhibits proper breathing techniques, such as pursed lip breathing or other method taught during program session Verbalizes importance of monitoring SPO2 with pulse oximeter and return demonstration;Maintenance of O2 saturations>88%;Compliance with respiratory medication;Exhibits proper breathing techniques, such as pursed lip breathing or other method taught during program session Verbalizes importance of monitoring SPO2 with pulse oximeter and return demonstration;Maintenance of O2 saturations>88%;Compliance with respiratory medication;Exhibits proper breathing techniques, such as pursed lip breathing or other method taught during program session     Comments Reviewed PLB technique with pt.  Talked about how it works and it's importance in maintaining their exercise saturations. Ronalee Belts is doing well in rehab.  He is compliant with his CPAP. He tries to use his PLB to help control his breathing.  His wife helps him keep an eye on it at home. Ronalee Belts continues to do well in rehab and improved his post 6MWT.  His continues to be compliant with his CPAP.  He conitnues to use PLB to help with breath control.  They also closely monitor his oxygen level at home too.     Goals/Expected Outcomes Short: Become more profiecient at using PLB.   Long: Become independent at using PLB. Short: Continue to work on PLB Long: Continued compliance with CPAP Short: Continue to work on PLB Long: Continued compliance with CPAP              Oxygen Discharge (Final Oxygen Re-Evaluation):  Oxygen Re-Evaluation - 04/17/22 1152       Program Oxygen Prescription   Program Oxygen Prescription None      Home Oxygen   Home Oxygen Device None    Sleep Oxygen Prescription CPAP    Home Exercise Oxygen Prescription None    Home Resting Oxygen  Prescription  None    Compliance with Home Oxygen Use Yes      Goals/Expected Outcomes   Short Term Goals To learn and understand importance of monitoring SPO2 with pulse oximeter and demonstrate accurate use of the pulse oximeter.;To learn and understand importance of maintaining oxygen saturations>88%;To learn and demonstrate proper pursed lip breathing techniques or other breathing techniques.     Long  Term Goals Verbalizes importance of monitoring SPO2 with pulse oximeter and return demonstration;Maintenance of O2 saturations>88%;Compliance with respiratory medication;Exhibits proper breathing techniques, such as pursed lip breathing or other method taught during program session    Comments Ronalee Belts continues to do well in rehab and improved his post 6MWT.  His continues to be compliant with his CPAP.  He conitnues to use PLB to help with breath control.  They also closely monitor his oxygen level at home too.    Goals/Expected Outcomes Short: Continue to work on PLB Long: Continued compliance with CPAP             Initial Exercise Prescription:  Initial Exercise Prescription - 12/26/21 1500       Date of Initial Exercise RX and Referring Provider   Date 12/26/21    Referring Provider Aleskerov      Oxygen   Maintain Oxygen Saturation 88% or higher      Treadmill   MPH 0.8    Grade 0    Minutes 15    METs 1      Recumbant Bike   Level 1    RPM 60    Minutes 15    METs 1      NuStep   Level 1    SPM 80    Minutes 15    METs 1      Prescription Details   Frequency (times per week) 2    Duration Progress to 30 minutes of continuous aerobic without signs/symptoms of physical distress      Intensity   THRR 40-80% of Max Heartrate 90-120    Ratings of Perceived Exertion 11-13    Perceived Dyspnea 0-4      Progression   Progression Continue to progress workloads to maintain intensity without signs/symptoms of physical distress.      Resistance Training   Training  Prescription Yes    Weight 4.0    Reps 10-15             Perform Capillary Blood Glucose checks as needed.  Exercise Prescription Changes:   Exercise Prescription Changes     Row Name 12/26/21 1500 01/11/22 0900 01/24/22 1200 02/06/22 1500 02/08/22 1300     Response to Exercise   Blood Pressure (Admit) 170/90 128/68 124/66 138/80 --   Blood Pressure (Exercise) 190/80 136/74 -- 142/76 --   Blood Pressure (Exit) 140/80 148/64 134/72 124/60 --   Heart Rate (Admit) 60 bpm 62 bpm 61 bpm 59 bpm --   Heart Rate (Exercise) 101 bpm 76 bpm 75 bpm 85 bpm --   Heart Rate (Exit) 69 bpm 70 bpm 67 bpm 60 bpm --   Oxygen Saturation (Admit) 96 % 98 % 98 % 98 % --   Oxygen Saturation (Exercise) 96 % 97 % 97 % 98 % --   Oxygen Saturation (Exit) 98 % 97 % 98 % 98 % --   Rating of Perceived Exertion (Exercise) '13 13 13 13 ' --   Perceived Dyspnea (Exercise) 0 2 0 2 --   Symptoms none SOB -- SOB --   Comments  6 MWT results 2nd full day of exercise -- -- --   Duration -- Progress to 30 minutes of  aerobic without signs/symptoms of physical distress Progress to 30 minutes of  aerobic without signs/symptoms of physical distress Continue with 30 min of aerobic exercise without signs/symptoms of physical distress. --   Intensity -- THRR unchanged THRR unchanged THRR unchanged --     Progression   Progression -- Continue to progress workloads to maintain intensity without signs/symptoms of physical distress. Continue to progress workloads to maintain intensity without signs/symptoms of physical distress. Continue to progress workloads to maintain intensity without signs/symptoms of physical distress. --   Average METs -- 1.66 1.75 1.72 --     Resistance Training   Training Prescription -- Yes Yes Yes --   Weight -- 4 lb 4 lb 4 lb --   Reps -- 10-15 10-15 10-15 --     Interval Training   Interval Training -- No No No --     Treadmill   MPH -- 0.8 -- 0.8 --   Grade -- 0 -- 0 --   Minutes -- 15 --  15 --   METs -- 1.61 -- 1.6 --     Recumbant Bike   Level -- -- 1 -- --   Minutes -- -- 15 -- --   METs -- -- 2 -- --     NuStep   Level -- '2 3 4 ' --   Minutes -- '15 15 15 ' --   METs -- 1.5 1.5 2 --     Track   Laps -- -- -- 9 --   Minutes -- -- -- 15 --   METs -- -- -- 1.54 --     Home Exercise Plan   Plans to continue exercise at -- -- -- -- [employer] (comment)  [employer] -- -- -- -- Add 1 additional day to program exercise sessions.   Initial Home Exercises Provided -- -- -- -- 02/08/22     Oxygen   Maintain Oxygen Saturation -- 88% or higher -- 88% or higher 88% or higher    Row Name 02/20/22 1300 03/06/22 1100 03/21/22 1200 04/05/22 1600 04/19/22 1600     Response to Exercise   Blood Pressure (Admit) 128/76 138/60 162/82 162/78 136/70   Blood Pressure (Exit) 122/68 126/70 140/84 130/82 124/60   Heart Rate (Admit) 55 bpm 70 bpm 56 bpm 56 bpm 57 bpm   Heart Rate (Exercise) 65 bpm 78 bpm 92 bpm 69 bpm 74 bpm   Heart Rate (Exit) 64 bpm 63 bpm 75 bpm 56 bpm 57 bpm   Oxygen Saturation (Admit) 98 % 98 % 98 % 98 % 99 %   Oxygen Saturation (Exercise) 98 % 99 % 98 % 97 % 97 %   Oxygen Saturation (Exit) 97 % 98 % 96 % 98 % 98 %   Rating of Perceived Exertion (Exercise) '12 13 13 13 12   ' Perceived Dyspnea (Exercise) 1 1 0 0 0   Symptoms -- none none none none   Duration Continue with 30 min of aerobic exercise without signs/symptoms of physical distress. Continue with 30 min of aerobic exercise without signs/symptoms of physical distress. Continue with 30 min of aerobic exercise without signs/symptoms of physical distress. Continue with 30 min of aerobic exercise without signs/symptoms of physical distress. Continue with 30 min of aerobic exercise without signs/symptoms of physical distress.   Intensity THRR unchanged THRR unchanged THRR unchanged THRR  unchanged THRR unchanged     Progression   Progression Continue to progress workloads to maintain  intensity without signs/symptoms of physical distress. Continue to progress workloads to maintain intensity without signs/symptoms of physical distress. Continue to progress workloads to maintain intensity without signs/symptoms of physical distress. Continue to progress workloads to maintain intensity without signs/symptoms of physical distress. Continue to progress workloads to maintain intensity without signs/symptoms of physical distress.   Average METs 1.4 1.53 1.75 1.62 1.55     Resistance Training   Training Prescription Yes Yes Yes Yes Yes   Weight 4 lb 4 lb 4 lb 4 lb 4 lb   Reps 10-15 10-15 10-15 10-15 10-15     Interval Training   Interval Training No No No No No     Treadmill   MPH -- 0.8 -- -- --   Grade -- 0 -- -- --   Minutes -- 15 -- -- --   METs -- 1.61 -- -- --     Recumbant Bike   Level 2 -- -- -- --   Minutes 15 -- -- -- --   METs 1.1 -- -- -- --     NuStep   Level '2 3 4 4 5   ' Minutes '15 15 15 30 30   ' METs 1.4 1.5 1.75 1.6 1.8     Home Exercise Plan   Plans to continue exercise at [employer] (comment)  [employer] (comment)  [employer] (comment)  [employer] (comment)  [place] (comment)  [employer]   Frequency Add 1 additional day to program exercise sessions. Add 1 additional day to program exercise sessions. Add 1 additional day to program exercise sessions. Add 1 additional day to program exercise sessions. Add 1 additional day to program exercise sessions.   Initial Home Exercises Provided 02/08/22 02/08/22 02/08/22 02/08/22 02/08/22     Oxygen   Maintain Oxygen Saturation 88% or higher 88% or higher 88% or higher 88% or higher 88% or higher            Exercise Comments:   Exercise Comments     Row Name 12/28/21 1029           Exercise Comments First full day of exercise!  Patient was oriented to gym and equipment including functions,  settings, policies, and procedures.  Patient's individual exercise prescription and treatment plan were reviewed.  All starting workloads were established based on the results of the 6 minute walk test done at initial orientation visit.  The plan for exercise progression was also introduced and progression will be customized based on patient's performance and goals.                Exercise Goals and Review:   Exercise Goals     Row Name 12/26/21 1554             Exercise Goals   Increase Physical Activity Yes       Intervention Provide advice, education, support and counseling about physical activity/exercise needs.;Develop an individualized exercise prescription for aerobic and resistive training based on initial evaluation findings, risk stratification, comorbidities and participant's personal goals.       Expected Outcomes Short Term: Attend rehab on a regular basis to increase amount of physical activity.;Long Term: Add in home exercise to make exercise part of routine and to increase amount of physical activity.;Long Term: Exercising regularly at least 3-5 days  a week.       Increase Strength and Stamina Yes       Intervention Provide advice, education, support and counseling about physical activity/exercise needs.;Develop an individualized exercise prescription for aerobic and resistive training based on initial evaluation findings, risk stratification, comorbidities and participant's personal goals.       Expected Outcomes Short Term: Increase workloads from initial exercise prescription for resistance, speed, and METs.;Short Term: Perform resistance training exercises routinely during rehab and add in resistance training at home;Long Term: Improve cardiorespiratory fitness, muscular endurance and strength as measured by increased METs and functional capacity (6MWT)       Able to understand and use rate of perceived exertion (RPE) scale Yes       Intervention Provide education and  explanation on how to use RPE scale       Expected Outcomes Short Term: Able to use RPE daily in rehab to express subjective intensity level;Long Term:  Able to use RPE to guide intensity level when exercising independently       Able to understand and use Dyspnea scale Yes       Intervention Provide education and explanation on how to use Dyspnea scale       Expected Outcomes Short Term: Able to use Dyspnea scale daily in rehab to express subjective sense of shortness of breath during exertion;Long Term: Able to use Dyspnea scale to guide intensity level when exercising independently       Knowledge and understanding of Target Heart Rate Range (THRR) Yes       Intervention Provide education and explanation of THRR including how the numbers were predicted and where they are located for reference       Expected Outcomes Short Term: Able to state/look up THRR;Long Term: Able to use THRR to govern intensity when exercising independently;Short Term: Able to use daily as guideline for intensity in rehab       Able to check pulse independently Yes       Intervention Provide education and demonstration on how to check pulse in carotid and radial arteries.;Review the importance of being able to check your own pulse for safety during independent exercise       Expected Outcomes Short Term: Able to explain why pulse checking is important during independent exercise;Long Term: Able to check pulse independently and accurately       Understanding of Exercise Prescription Yes       Intervention Provide education, explanation, and written materials on patient's individual exercise prescription       Expected Outcomes Short Term: Able to explain program exercise prescription;Long Term: Able to explain home exercise prescription to exercise independently                Exercise Goals Re-Evaluation :  Exercise Goals Re-Evaluation     Row Name 12/28/21 1029 01/11/22 0943 01/24/22 1250 02/06/22 1536 02/08/22  1306     Exercise Goal Re-Evaluation   Exercise Goals Review Increase Physical Activity;Able to understand and use rate of perceived exertion (RPE) scale;Knowledge and understanding of Target Heart Rate Range (THRR);Understanding of Exercise Prescription;Increase Strength and Stamina;Able to understand and use Dyspnea scale;Able to check pulse independently Increase Physical Activity;Increase Strength and Stamina Increase Physical Activity;Increase Strength and Stamina Increase Physical Activity;Increase Strength and Stamina;Understanding of Exercise Prescription Increase Physical Activity;Increase Strength and Stamina;Able to understand and use rate of perceived exertion (RPE) scale;Able to check pulse independently   Comments Reviewed RPE and dyspnea scales, THR and program prescription  with pt today.  Pt voiced understanding and was given a copy of goals to take home. Karnell is trying to get adjusted to rehab for the first couple of sessions that he has been here. Patient has attempted to leave rehab early several times. Staff has talked with patient and explained the program.  Patient was willing to come back to give it another try. If patient decides to pursue with the program, staff will start to focus on progressing with loads and levels. Will continue to monitor. Yadier has been better with attendance last few sessions.  He has increased to level 3 on NS and  tried TM.  Staff will continue to monitor. Ronalee Belts is doing well in rehab.  He is walking some now during class. We will continue to monitor his progress. Reviewed home exercise with pt today.  Pt plans to use gym at [facility] for exercise.  Reviewed THR, pulse, RPE, sign and symptoms, pulse oximetery and when to call 911 or MD.  Also discussed weather considerations and indoor options.  Pt voiced understanding.   Expected Outcomes Short: Use RPE daily to regulate intensity. Long: Follow program prescription in THR. Short: Continue attendance with  rehab Long: Continue to build up overall strength and stamina Short; attend consistently Long: improve overall stamina Short: Continue to walk more each day Long: Conitnue to improve stamina Short:  add one day to program sessions Long:  maintain exercise on his own    [place] Name 02/15/22 1125 02/20/22 1308 03/06/22 1139 03/13/22 1133 03/21/22 1253     Exercise Goal Re-Evaluation   Exercise Goals Review Increase Physical Activity;Increase Strength and Stamina Increase Physical Activity;Increase Strength and Stamina Increase Physical Activity;Increase Strength and Stamina Increase Physical Activity;Increase Strength and Stamina Increase Physical Activity;Increase Strength and Stamina   Comments Ronalee Belts lives at [facility] where they have a nice fitness center.  He hasnt used it yet as he still doesnt sleep well and is tired. Ronalee Belts has started trying to walk more.  He is up to 1 mph on TM.  Oxygen sats stay in 90s.  We will continue to monitor  progress. Ronalee Belts is tolerating his exercise at rehab. He continues to walk on the treadmill more but has not been able to increase his speed. He is not quite hitting his THRZ but RPEs are staying in good range. We hope to see his walking tolerance to increase over time. T4 Nustep is currently at level 3. Will continue to monitor. Ronalee Belts does walk osme at home on days not at [facility].  He has some weights at home he uses during the day. Ronalee Belts has increased to level 4 on NS and 4 lb for strength.  His average MET level has increased to 1.75.  we will continue to monitor.   Expected Outcomes Short: try exercise one day a week not at [place]: maintain exercise independently Short : continue to work on walking Long:  improve overall stamina Short:  Increase loads on treadmill as tolerated Long: Continue to increase overall MET level Short: continue to exercise outside program sessions Long:  continue to exercise on his own Short:  work on walking stamina Long: continue to increase MET level     [place] Name 04/05/22 1611 04/17/22 1147 04/19/22 1604         Exercise Goal Re-Evaluation   Exercise Goals Review Increase Physical Activity;Increase Strength and Stamina Increase Physical Activity;Increase Strength and Stamina;Understanding of Exercise Prescription Increase Physical Activity;Increase Strength and Stamina;Understanding  of Exercise Prescription     Comments Ronalee Belts has been staying on the T4 Nustep the last couple of sessions he has been at rehab. We will encourage him to try walking again. He has a post 6MWT due soon and we hope to see improvement. Will continue to monitor. Ronalee Belts improved his post 6MWT by 60 ft!  He still struggles with fatigue in his legs more than his breathing, but he is now walking down to rehab from the parking with only 1 rest break. Ayodeji continues to do well in rehab. He improved on his post 6MWT by 11%. He continues to work on the T4 for the full 30 minutes, however has increased to level 5 overall. He will be graduating soon and will continue to monitor until then.     Expected Outcomes Short: Improve on 6MWT Long: Continue to improve overall strength and stamina Short: Continue to walk more at home Long: Conitnue to improve stamina Short: Graduate Long: Continue to increase overall MET level              Discharge Exercise Prescription (Final Exercise Prescription Changes):  Exercise Prescription Changes - 04/19/22 1600       Response to Exercise   Blood Pressure (Admit) 136/70    Blood Pressure (Exit) 124/60    Heart Rate (Admit) 57 bpm    Heart Rate (Exercise) 74 bpm    Heart Rate (Exit) 57 bpm    Oxygen Saturation (Admit) 99 %    Oxygen Saturation (Exercise) 97 %    Oxygen Saturation (Exit) 98 %    Rating of Perceived Exertion (Exercise) 12    Perceived Dyspnea (Exercise) 0    Symptoms none    Duration Continue with 30 min of aerobic exercise without signs/symptoms of physical distress.    Intensity THRR unchanged      Progression    Progression Continue to progress workloads to maintain intensity without signs/symptoms of physical distress.    Average METs 1.55      Resistance Training   Training Prescription Yes    Weight 4 lb    Reps 10-15      Interval Training   Interval Training No      NuStep   Level 5    Minutes 30    METs 1.8      Home Exercise Plan   Plans to continue exercise at [employer] (comment)   [facility]   Frequency Add 1 additional day to program exercise sessions.    Initial Home Exercises Provided 02/08/22      Oxygen   Maintain Oxygen Saturation 88% or higher             Nutrition:  Target Goals: Understanding of nutrition guidelines, daily intake of sodium <1557m, cholesterol <2011m calories 30% from fat and 7% or less from saturated fats, daily to have 5 or more servings of fruits and vegetables.  Education: All About Nutrition: -Group instruction provided by verbal, written material, interactive activities, discussions, models, and posters to present general guidelines for heart healthy nutrition including fat, fiber, MyPlate, the role of sodium in heart healthy nutrition, utilization of the nutrition label, and utilization of this knowledge for meal planning. Follow up email sent as well. Written material given at graduation. [facility]  Date 01/04/22  Educator [facility]ruction Review Code 1- Verbalizes Understanding       Biometrics:  Pre Biometrics - 12/26/21  1554       Pre Biometrics   Height 5' 10.5" (1.791 m)    Weight 238 lb 4.8 oz (108.1 kg)    BMI (Calculated) 33.7             Post Biometrics - 04/17/22 1147        Post  Biometrics   Height 5' 10.5" (1.791 m)    Weight 236 lb 6.4 oz (107.2 kg)    BMI (Calculated) 33.43             Nutrition Therapy Plan and Nutrition Goals:  Nutrition Therapy & Goals - 01/23/22 1142       Nutrition Therapy   RD appointment  deferred Yes             Nutrition Assessments:  MEDIFICTS Score Key: ?70 Need to make dietary changes  40-70 Heart Healthy Diet ? 40 Therapeutic Level Cholesterol Diet  Flowsheet Row Pulmonary Rehab from 04/19/2022 in [facility] Cardiac and Pulmonary Rehab  Picture Your Plate Total Score on Discharge 59      Picture Your Plate Scores: <81 Unhealthy dietary pattern with much room for improvement. 41-50 Dietary pattern unlikely to meet recommendations for good health and room for improvement. 51-60 More healthful dietary pattern, with some room for improvement.  >60 Healthy dietary pattern, although there may be some specific behaviors that could be improved.   Nutrition Goals Re-Evaluation:  Nutrition Goals Re-Evaluation     [place] Name 01/23/22 1141 02/15/22 1132 04/17/22 1150         Goals   Comment Ronalee Belts has deferred his nutrition appointment. His wife conitnues to help him focus on healhty eating. Ronalee Belts still defers nutrition Ronalee Belts still defers nutrition.  His wife manages most of his meals, but he eats what she cooks.  She aims for a variety and balanced healthy diet.     Expected Outcome Continue to eat healthy Maintain weight Continue to eat healthy              Nutrition Goals Discharge (Final Nutrition Goals Re-Evaluation):  Nutrition Goals Re-Evaluation - 04/17/22 1150       Goals   Comment Ronalee Belts still defers nutrition.  His wife manages most of his meals, but he eats what she cooks.  She aims for a variety and balanced healthy diet.    Expected Outcome Continue to eat healthy             Psychosocial: Target Goals: Acknowledge presence or absence of significant depression and/or stress, maximize coping skills, provide positive support system. Participant is able to verbalize types and ability to use techniques and skills needed for reducing stress and depression.   Education: Stress, Anxiety, and Depression - Group verbal and visual presentation to define  topics covered.  Reviews how body is impacted by stress, anxiety, and depression.  Also discusses healthy ways to reduce stress and to treat/manage anxiety and depression.  Written material given at graduation. Flowsheet Row Pulmonary Rehab from 02/15/2022 in [facility] Cardiac and Pulmonary Rehab  Date 01/25/22  Educator [facility]  Instruction Review Code 1- [employer] Understanding       Education: Sleep Hygiene -Provides group verbal and written instruction about how sleep can affect your health.  Define sleep hygiene, discuss sleep cycles and impact of sleep habits. Review good sleep hygiene tips.    Initial Review & Psychosocial Screening:  Initial Psych Review & Screening - 11/14/21 1422       Initial Review   Current  issues with Current Sleep Concerns;Current Stress Concerns    Source of Stress Concerns Chronic Illness      Family Dynamics   Good Support System? Yes   wife     Barriers   Psychosocial barriers to participate in program There are no identifiable barriers or psychosocial needs.;The patient should benefit from training in stress management and relaxation.      Screening Interventions   Interventions Encouraged to exercise;Provide feedback about the scores to participant;To provide support and resources with identified psychosocial needs    Expected Outcomes Short Term goal: Utilizing psychosocial counselor, staff and physician to assist with identification of specific Stressors or current issues interfering with healing process. Setting desired goal for each stressor or current issue identified.;Long Term Goal: Stressors or current issues are controlled or eliminated.;Short Term goal: Identification and review with participant of any Quality of Life or Depression concerns found by scoring the questionnaire.;Long Term goal: The participant improves quality of Life and PHQ9 Scores as seen by post scores and/or verbalization of changes             Quality of Life  Scores:  Scores of 19 and below usually indicate a poorer quality of life in these areas.  A difference of  2-3 points is a clinically meaningful difference.  A difference of 2-3 points in the total score of the Quality of Life Index has been associated with significant improvement in overall quality of life, self-image, physical symptoms, and general health in studies assessing change in quality of life.  PHQ-9: Review Flowsheet        04/24/2022 12/26/2021  Depression screen PHQ 2/9  Decreased Interest 3   3 0  Down, Depressed, Hopeless '1   1 1  ' PHQ - 2 Score '4   4 1  ' Altered sleeping '3   3 3  ' Tired, decreased energy '3   3 3  ' Change in appetite 0   0 0  Feeling bad or failure about yourself  2   2 0  Trouble concentrating 1   1 0  Moving slowly or fidgety/restless 0   0 1  Suicidal thoughts 0   1 0  PHQ-9 Score '13   14 8  ' Difficult doing work/chores Somewhat difficult   Somewhat difficult Somewhat difficult      Multiple values from one day are sorted in reverse-chronological order       Interpretation of Total Score  Total Score Depression Severity:  1-4 = Minimal depression, 5-9 = Mild depression, 10-14 = Moderate depression, 15-19 = Moderately severe depression, 20-27 = Severe depression   Psychosocial Evaluation and Intervention:  Psychosocial Evaluation - 11/14/21 1422       Psychosocial Evaluation & Interventions   Interventions Encouraged to exercise with the program and follow exercise prescription;Stress management education;Relaxation education    Comments Norberto is coming to pulmonary rehab with OSA. He has become deconditioned post stroke, prostate cancer and other medical issues. The stroke left him weak and requires him to use a rollator. His biggest stressor is his frequent urination which impacts his daily life and especially his sleep routine. He is up multiple times a night which leads to excessive daytime sleepiness. He doesn't feel like being  active during the day becasue he is so tired. He is in contact with his MD and have been trialing different options to help in this area with little success. His wife is his main support system and caretaker. She and he are both  wanting him to become stronger and more able to do things himself, however he is hesitant about progress when it comes to how tired he feels during the day.    Expected Outcomes Short: attend pulmonary rehab for education and exercise. Long: Develop and maintain positive self care habits.    Continue Psychosocial Services  Follow up required by staff             Psychosocial Re-Evaluation:  Psychosocial Re-Evaluation     Row Name 01/23/22 1136 02/15/22 1130 03/13/22 1130 04/17/22 1148       Psychosocial Re-Evaluation   Current issues with Current Stress Concerns;Current Depression;Current Sleep Concerns Current Sleep Concerns;Current Depression Current Sleep Concerns Current Sleep Concerns;Current Stress Concerns    Comments Ronalee Belts is doing well in rehab.  He is still feeling depressed.  He is still struggling with sleep at night as he is up and down to bathroom frequently to the bathroom.  He has tried multiple external catherters as they would pull off at night.  He does not want to get an internal one placed.  He is tired during the day which he finds very frustrating. His wife is his biggest supporter and tries to help him as she is able. Ronalee Belts still has trouble sleeping due to needing to go to the bathroom.  We discussed trying Pelvic Floor PT to help.  He is tired during the day due to getting up so much at night. Ronalee Belts had a stroke that causes his problem with needing to urinate frequently.  He is working with his Development worker, international aid on this. Ronalee Belts continues to struggle with sleep and frequent urination.  He continues to work with his doctors on this.  He is also struggling with fatigue in his legs.  We continue to encourage him to walk more at home.    Expected Outcomes Short:  Continue to rest during day to recover from the night. Long: Continue to focus on positive. Short:  consider PT Long: develop better sleep patterns Short: continue to work with Dr about urinary issues Long:  be able to sleep better Short: continue to work with Dr about urinary issues Long: continue to exercies for mental boost    Interventions -- -- -- Encouraged to attend Pulmonary Rehabilitation for the exercise    Continue Psychosocial Services  -- -- -- Follow up required by staff             Psychosocial Discharge (Final Psychosocial Re-Evaluation):  Psychosocial Re-Evaluation - 04/17/22 1148       Psychosocial Re-Evaluation   Current issues with Current Sleep Concerns;Current Stress Concerns    Comments Ronalee Belts continues to struggle with sleep and frequent urination.  He continues to work with his doctors on this.  He is also struggling with fatigue in his legs.  We continue to encourage him to walk more at home.    Expected Outcomes Short: continue to work with Dr about urinary issues Long: continue to exercies for mental boost    Interventions Encouraged to attend Pulmonary Rehabilitation for the exercise    Continue Psychosocial Services  Follow up required by staff             Education: Education Goals: Education classes will be provided on a weekly basis, covering required topics. Participant will state understanding/return demonstration of topics presented.  Learning Barriers/Preferences:  Learning Barriers/Preferences - 11/14/21 1422       Learning Barriers/Preferences   Learning Barriers None    Learning Preferences Individual Instruction  General Pulmonary Education Topics:  Infection Prevention: - Provides verbal and written material to individual with discussion of infection control including proper hand washing and proper equipment cleaning during exercise session. Flowsheet Row Pulmonary Rehab from 02/15/2022 in [facility] Cardiac and Pulmonary  Rehab  Date 12/26/21  Educator [facility]  Instruction Review Code 1- Verbalizes Understanding       Falls Prevention: - Provides verbal and written material to individual with discussion of falls prevention and safety. Flowsheet Row Pulmonary Rehab from 02/15/2022 in [facility] Cardiac and Pulmonary Rehab  Date 12/26/21  Educator [facility]  Instruction Review Code 1- Verbalizes Understanding       Chronic Lung Disease Review: - Group verbal instruction with posters, models, PowerPoint presentations and videos,  to review new updates, new respiratory medications, new advancements in procedures and treatments. Providing information on websites and "800" numbers for continued self-education. Includes information about supplement oxygen, available portable oxygen systems, continuous and intermittent flow rates, oxygen safety, concentrators, and Medicare reimbursement for oxygen. Explanation of Pulmonary Drugs, including class, frequency, complications, importance of spacers, rinsing mouth after steroid MDI's, and proper cleaning methods for nebulizers. Review of basic lung anatomy and physiology related to function, structure, and complications of lung disease. Review of risk factors. Discussion about methods for diagnosing sleep apnea and types of masks and machines for OSA. Includes a review of the use of types of environmental controls: home humidity, furnaces, filters, dust mite/pet prevention, HEPA vacuums. Discussion about weather changes, air quality and the benefits of nasal washing. Instruction on Warning signs, infection symptoms, calling MD promptly, preventive modes, and value of vaccinations. Review of effective airway clearance, coughing and/or vibration techniques. Emphasizing that all should Create an Action Plan. Written material given at graduation. Flowsheet Row Pulmonary Rehab from 02/15/2022 in [facility] Cardiac and Pulmonary Rehab  Education need identified 12/26/21  Date 01/18/22  Educator [facility]   Instruction Review Code 1- Verbalizes Understanding       AED/CPR: - Group verbal and written instruction with the use of models to demonstrate the basic use of the AED with the basic ABC's of resuscitation.    Anatomy and Cardiac Procedures: - Group verbal and visual presentation and models provide information about basic cardiac anatomy and function. Reviews the testing methods done to diagnose heart disease and the outcomes of the test results. Describes the treatment choices: Medical Management, Angioplasty, or Coronary Bypass Surgery for treating various heart conditions including Myocardial Infarction, Angina, Valve Disease, and Cardiac Arrhythmias.  Written material given at graduation. Flowsheet Row Pulmonary Rehab from 02/15/2022 in [facility] Pulmonary Rehab  Date 02/15/22  Educator [facility]  Instruction Review Code 1- Verbalizes Understanding       Medication Safety: - Group verbal and visual instruction to review commonly prescribed medications for heart and lung disease. Reviews the medication, class of the drug, and side effects. Includes the steps to properly store meds and maintain the prescription regimen.  Written material given at graduation. Flowsheet Row Pulmonary [facility] [facility] Cardiac and Pulmonary Rehab  Date 12/28/21  Educator [facility]  Instruction Review Code 1- Verbalizes Understanding       Other: -Provides group and verbal instruction on various topics (see comments)   Knowledge Questionnaire Score:  Knowledge Questionnaire Score - 04/24/22 1416       Knowledge Questionnaire Score   Post Score 13/18              Core Components/Risk Factors/Patient Goals at Admission:  Personal Goals and Risk  Factors at Admission - 12/26/21 1600       Core Components/Risk Factors/Patient Goals on Admission    Weight Management Yes    Intervention Weight Management: Develop a combined nutrition and exercise program designed to reach desired  caloric intake, while maintaining appropriate intake of nutrient and fiber, sodium and fats, and appropriate energy expenditure required for the weight goal.;Weight Management: Provide education and appropriate resources to help participant work on and attain dietary goals.;Weight Management/Obesity: Establish reasonable short term and long term weight goals.;Obesity: Provide education and appropriate resources to help participant work on and attain dietary goals.    Admit Weight 238 lb 4.8 oz (108.1 kg)    Goal Weight: Short Term 233 lb (105.7 kg)    Goal Weight: Long Term 228 lb (103.4 kg)    Expected Outcomes Short Term: Continue to assess and modify interventions until short term weight is achieved;Long Term: Adherence to nutrition and physical activity/exercise program aimed toward attainment of established weight goal;Understanding recommendations for meals to include 15-35% energy as protein, 25-35% energy from fat, 35-60% energy from carbohydrates, less than 289m of dietary cholesterol, 20-35 gm of total fiber daily;Understanding of distribution of calorie intake throughout the day with the consumption of 4-5 meals/snacks;Weight Loss: Understanding of general recommendations for a balanced deficit meal plan, which promotes 1-2 lb weight loss per week and includes a negative energy balance of [phone] kcal/d    Diabetes Yes    Intervention Provide education about signs/symptoms and action to take for hypo/hyperglycemia.;Provide education about proper nutrition, including hydration, and aerobic/resistive exercise prescription along with prescribed medications to achieve blood glucose in normal ranges: Fasting glucose 65-99 mg/dL    Expected Outcomes Long Term: Attainment of HbA1C < 7%.;Short Term: Participant verbalizes understanding of the signs/symptoms and immediate care of hyper/hypoglycemia, proper foot care and importance of medication, aerobic/resistive exercise and nutrition plan for blood  glucose control.    Hypertension Yes    Intervention Provide education on lifestyle modifcations including regular physical activity/exercise, weight management, moderate sodium restriction and increased consumption of fresh fruit, vegetables, and low fat dairy, alcohol moderation, and smoking cessation.;Monitor prescription use compliance.    Expected Outcomes Short Term: Continued assessment and intervention until BP is < 140/967mHG in hypertensive participants. < 130/8024mG in hypertensive participants with diabetes, heart failure or chronic kidney disease.;Long Term: Maintenance of blood pressure at goal levels.    Lipids Yes    Intervention Provide education and support for participant on nutrition & aerobic/resistive exercise along with prescribed medications to achieve LDL <55m90mDL >40mg49m Expected Outcomes Short Term: Participant states understanding of desired cholesterol values and is compliant with medications prescribed. Participant is following exercise prescription and nutrition guidelines.;Long Term: Cholesterol controlled with medications as prescribed, with individualized exercise RX and with personalized nutrition plan. Value goals: LDL < 55mg,69m > 40 mg.             Education:Diabetes - Individual verbal and written instruction to review signs/symptoms of diabetes, desired ranges of glucose level fasting, after meals and with exercise. Acknowledge that pre and post exercise glucose checks will be done for 3 sessions at entry of program.   Know Your Numbers and Heart Failure: - Group verbal and visual instruction to discuss disease risk factors for cardiac and pulmonary disease and treatment options.  Reviews associated critical values for Overweight/Obesity, Hypertension, Cholesterol, and Diabetes.  Discusses basics of heart failure: signs/symptoms and treatments.  Introduces Heart Failure Zone chart for  action plan for heart failure.  Written material given at  graduation. Flowsheet Row Pulmonary Rehab from 02/15/2022 in [facility] Cardiac and Pulmonary Rehab  Date 01/11/22  Educator [facility]  Instruction Review Code 1- Verbalizes Understanding       Core Components/Risk Factors/Patient Goals Review:   Goals and Risk Factor Review     Row Name 01/23/22 1142 02/15/22 1118 03/13/22 1123 04/17/22 1151       Core Components/Risk Factors/Patient Goals Review   Personal Goals Review Weight Management/Obesity;Hypertension;Diabetes;Lipids Weight Management/Obesity;Hypertension;Diabetes Weight Management/Obesity;Diabetes;Hypertension Weight Management/Obesity;Diabetes;Hypertension    Review Ronalee Belts is doing well in rehab. His weight is slowly gaining, but he is trying to eat good and move but he just stays so tired from not sleeping.  His wife helps him check is pressures and sugars at home.  She makes sure that he is staying in the normal ranges.  His pressures vary from 120-140 and sugars are normal.  He does have a sweet tooth but finds his wife keeps him from eating it.  He has turned to tapioca to help. Ronalee Belts says his BG was 128 today at home.  Resting BP at [facility] is 128/76 today.  His weight today is 23, weight is staying steady. Ronalee Belts does check BG periodically at home.  BP was 116/66 today resting.  He does check that as well at home. Ronalee Belts has been able to continue to maintain his weight.  Pressures have been up more recently, but he continues to check them at home. They also continue to monitor his sugars which have been doing well.    Expected Outcomes Short; COntinue to work on weight loss Long: COnitnue to monitor risk factors. Short: continue to check BG/BP Long: manage risk factors and keep in ideal range Short:  continue to monitor BG/BP Long: keep risk factors in ideal range Short: Continue to keep close eye on pressures Long: COnitnue to montior risk factors             Core Components/Risk Factors/Patient Goals at Discharge (Final Review):   Goals and Risk  Factor Review - 04/17/22 1151       Core Components/Risk Factors/Patient Goals Review   Personal Goals Review Weight Management/Obesity;Diabetes;Hypertension    Review Ronalee Belts has been able to continue to maintain his weight.  Pressures have been up more recently, but he continues to check them at home. They also continue to monitor his sugars which have been doing well.    Expected Outcomes Short: Continue to keep close eye on pressures Long: COnitnue to montior risk factors             ITP Comments:  ITP Comments     Row Name 11/14/21 1428 12/26/21 1545 12/28/21 1029 01/04/22 0849 02/01/22 0832   ITP Comments Initial telephone orientation completed. Diagnosis can be found in [facility] 11/17. EP orientation scheduled for Wednesday 12/28 at 1:30 pm. Completed 6MWT and gym orientation. Initial ITP created and sent for review to Dr. Zetta Bills, Medical Director. First full day of exercise!  Patient was oriented to gym and equipment including functions, settings, policies, and procedures.  Patient's individual exercise prescription and treatment plan were reviewed.  All starting workloads were established based on the results of the 6 minute walk test done at initial orientation visit.  The plan for exercise progression was also introduced and progression will be customized based on patient's performance and goals. 30 Day review completed. Medical Director ITP review done, changes made as directed, and signed approval by  Medical Director.    new to program 30 Day review completed. Medical Director ITP review done, changes made as directed, and signed approval by Medical Director.    [place] Name 03/01/22 0830 03/29/22 1324 04/26/22 0839       ITP Comments 30 Day review completed. Medical Director ITP review done, changes made as directed, and signed approval by Medical Director. 30 Day review completed. Medical Director ITP review done, changes made as directed, and signed approval by Medical Director. 30  Day review completed. Medical Director ITP review done, changes made as directed, and signed approval by Medical Director.              Comments: discharge ITP

## 2022-05-03 NOTE — Progress Notes (Signed)
Discharge Report  Referring Provider: Emily Filbert, MD  Andrew Callahan graduated today from  rehab with 36 sessions completed.  Details of the patient's exercise prescription and what He needs to do in order to continue the prescription and progress were discussed with patient.  Patient was given a copy of prescription and goals.  Patient verbalized understanding.  Andrew Callahan plans to continue to exercise by going to the gym at [facility].   Andrew Callahan Name 12/26/21 1550 04/17/22 1145       6 Minute Walk   Phase Initial Discharge    Distance 540 feet 600 feet    Distance % Change -- 11.1 %    Distance Feet Change -- 60 ft    Walk Time 6 minutes 5.32 minutes    # of Rest Breaks 0 1  37 sec    MPH 1.02 1.14    METS 0.87 0.41    RPE 13 13    Perceived Dyspnea  0 0    VO2 Peak -- 1.46    Symptoms No Yes (comment)    Comments -- fatigue in legs    Resting HR 60 bpm 71 bpm    Resting BP 170/90 152/78    Resting Oxygen Saturation  96 % 98 %    Exercise Oxygen Saturation  during 6 min walk 96 % 97 %    Max Ex. HR 101 bpm 79 bpm    Max Ex. BP 190/80 146/70    2 Minute Post BP 156/82 138/70      Interval HR   1 Minute HR 83 76    2 Minute HR 93 76    3 Minute HR 87 79    4 Minute HR 87 71    5 Minute HR 85 76    6 Minute HR 101 72    2 Minute Post HR 69 52    Interval Heart Rate? Yes Yes      Interval Oxygen   Interval Oxygen? Yes Yes    Baseline Oxygen Saturation % 96 % 98 %    1 Minute Oxygen Saturation % 96 % 98 %    1 Minute Liters of Oxygen 0 L 0 L  Room Air    2 Minute Oxygen Saturation % 96 % 97 %    2 Minute Liters of Oxygen 0 L 0 L    3 Minute Oxygen Saturation % 96 % 98 %    3 Minute Liters of Oxygen 0 L 0 L    4 Minute Oxygen Saturation % 96 % 98 %    4 Minute Liters of Oxygen 0 L 0 L    5 Minute Oxygen Saturation % 96 % 98 %    5 Minute Liters of Oxygen 0 L 0 L    6 Minute Oxygen Saturation % 96 % 98 %    6 Minute Liters of Oxygen 0 L 0 L    2 Minute Post  Oxygen Saturation % 98 % 98 %    2 Minute Post Liters of Oxygen 0 L 0 L

## 2022-05-03 NOTE — Progress Notes (Signed)
Daily Session Note  Patient Details  Name: Andrew Callahan MRN: [id number] Date of Birth: 21-Nov-1934 Referring Provider:   Flowsheet Row Pulmonary Rehab from 12/26/2021 in [facility] Cardiac and Pulmonary Rehab  Referring Provider Lanney Gins       Encounter Date: 05/03/2022  Check In:  Session Check In - 05/03/22 1116       Check-In   Supervising physician immediately available to respond to emergencies See telemetry face sheet for immediately available ER MD    Location [facility]-Cardiac & Pulmonary Rehab    Staff Present Birdie Sons, MPA, RN;Joseph [place], RCP,RRT,BSRT;Jessica [place], MA, RCEP, CCRP, CCET;Susanne Bice, RN, BSN, CCRP    Virtual Visit No    Medication changes reported     No    Fall or balance concerns reported    No    Warm-up and Cool-down Performed on first and last piece of equipment    Resistance Training Performed Yes    VAD Patient? No    PAD/SET Patient? No      Pain Assessment   Currently in Pain? No/denies                Social History   Tobacco Use  Smoking Status Former  Smokeless Tobacco Never  Tobacco Comments   quit 50 years ago    Goals Met:  Independence with exercise equipment Exercise tolerated well No report of concerns or symptoms today Strength training completed today  Goals Unmet:  Not Applicable  Comments:  Alyus graduated today from  rehab with 36 sessions completed.  Details of the patient's exercise prescription and what He needs to do in order to continue the prescription and progress were discussed with patient.  Patient was given a copy of prescription and goals.  Patient verbalized understanding.  [name] plans to continue to exercise by going to the gym at [facility].    Dr. Emily Filbert is Medical Director for [place].  Dr. Ottie Glazier is Medical Director for [facility] Pulmonary Rehabilitation.

## 2022-05-15 ENCOUNTER — Other Ambulatory Visit: Payer: Self-pay | Admitting: Oncology

## 2022-05-15 DIAGNOSIS — C61 Malignant neoplasm of prostate: Secondary | ICD-10-CM

## 2022-05-15 NOTE — Telephone Encounter (Signed)
CBC with Differential/Platelet Order: [id number] Status: Final result    Visible to patient: Yes (seen)    Next appt: 05/30/2022 at 01:15 PM in Oncology ([facility])    Dx: Prostate cancer ([place])    0 Result Notes           Component Ref Range & Units 2 mo ago (03/02/22) 5 mo ago (12/01/21) 5 mo ago (11/19/21) 5 mo ago (11/18/21) 8 mo ago (09/01/21) 11 mo ago (06/02/21) 1 yr ago (03/01/21)  WBC 4.0 - 10.5 K/uL 6.5  7.4  8.1  11.1 High   7.5  8.8  7.4   RBC 4.22 - 5.81 MIL/uL 3.36 Low   4.13 Low   4.33  4.75  4.24  4.53  3.77 Low    Hemoglobin 13.0 - 17.0 g/dL 9.0 Low   12.0 Low   12.6 Low   13.9  12.2 Low   12.9 Low   10.9 Low    HCT 39.0 - 52.0 % 29.5 Low   36.7 Low   38.1 Low   41.9  37.4 Low   39.2  36.2 Low    MCV 80.0 - 100.0 fL 87.8  88.9  88.0  88.2  88.2  86.5  96.0   MCH 26.0 - 34.0 pg 26.8  29.1  29.1  29.3  28.8  28.5  28.9   MCHC 30.0 - 36.0 g/dL 30.5  32.7  33.1  33.2  32.6  32.9  30.1   RDW 11.5 - 15.5 % 16.1 High   15.6 High   15.1  15.5  15.4  14.7  18.1 High    Platelets 150 - 400 K/uL 240  220  170  203  197  246  291   nRBC 0.0 - 0.2 % 0.0  0.0  0.0 CM  0.0  0.0  0.0  0.0   Neutrophils Relative % % 69  68   58  73  74  76   Neutro Abs 1.7 - 7.7 K/uL 4.5  5.1   6.5  5.6  6.4  5.7   Lymphocytes Relative % '20  21   31  17  19  15   '$ Lymphs Abs 0.7 - 4.0 K/uL 1.3  1.5   3.4  1.3  1.7  1.1   Monocytes Relative % '7  8   9  7  6  7   '$ Monocytes Absolute 0.1 - 1.0 K/uL 0.5  0.6   1.0  0.5  0.5  0.5   Eosinophils Relative % '2  2   1  1  1  1   '$ Eosinophils Absolute 0.0 - 0.5 K/uL 0.1  0.1   0.1  0.1  0.1  0.0   Basophils Relative % 1  0   1  1  0  0   Basophils Absolute 0.0 - 0.1 K/uL 0.0  0.0   0.1  0.0  0.0  0.0   Immature Granulocytes % 1  1   0  1  0  1   Abs Immature Granulocytes 0.00 - 0.07 K/uL 0.05  0.06 CM   0.04 CM  0.04 CM  0.02 CM  0.04 CM   Comment: Performed at [facility], [address] [place], [place] [address]  Resulting Agency  [place] [facility] [place] [facility]  LAB [place] [facility] [place] [facility] [place] [facility] [place] [facility] [place] [facility]         Specimen  Collected: 03/02/22 09:47 Last Resulted: 03/02/22 10:13      Lab Flowsheet    Order Details    View Encounter    Lab and Collection Details    Routing    Result History    View Encounter Conversation      CM=Additional comments      Result Care Coordination   Patient Communication   Add Comments   Seen Back to Top       Other Results from 03/02/2022   Contains abnormal data Iron and TIBC([facility]) Order: [id number] Status: Final result    Visible to patient: Yes (seen)    Next appt: 05/30/2022 at 01:15 PM in Oncology ([facility])    Dx: Prostate cancer ([place]); Iron deficienc...    0 Result Notes           Component Ref Range & Units 2 mo ago (03/02/22) 5 mo ago (12/01/21) 8 mo ago (09/01/21) 11 mo ago (06/02/21) 1 yr ago (01/28/21) 1 yr ago (01/07/21) 1 yr ago (09/29/20)  Iron 45 - 182 ug/dL 31 Low   64  66  46  24 Low   47  34 Low    TIBC 250 - 450 ug/dL 391  347  314  354  351  346  398   Saturation Ratios 17.9 - 39.5 % 8 Low   '18  21  13 '$ Low   7 Low   14 Low   9 Low    UIBC ug/dL 360  283 CM  248 CM  308 CM  327 CM  299 CM  364 CM   Comment: Performed at [facility], [address] [place], [place] [address]  Resulting Agency  [facility] [place] [facility] [place] [facility] [place] [facility] [place] [facility] [place] [facility] [place] [facility]         Specimen Collected: 03/02/22 09:47 Last Resulted: 03/02/22 10:59      Lab Flowsheet    Order Details    View Encounter    Lab and Collection Details    Routing    Result History    View Encounter Conversation      CM=Additional comments      Result Care Coordination   Patient Communication   Add Comments   Seen Back to Top         PSA Order: [id number] Status: Final result    Visible to patient: Yes (seen)    Next appt: 05/30/2022 at 01:15 PM in Oncology ([facility])    Dx: Prostate cancer ([place])    0 Result Notes            Component Ref Range & Units 2 mo ago (03/02/22) 5 mo ago (12/01/21) 8 mo ago (09/01/21) 11 mo ago (06/02/21) 1 yr ago (03/01/21) 1 yr ago (01/28/21) 1 yr ago (12/31/20)  Prostatic Specific Antigen 0.00 - 4.00 ng/mL <0.01  0.01 CM  <0.01 CM  <0.01 CM  0.04 CM  0.06 CM  0.10 CM   Comment: (NOTE)  While PSA levels of <=4.0 ng/ml are reported as reference range, some  men with levels below 4.0 ng/ml can have prostate cancer and many men  with PSA above 4.0 ng/ml do not have prostate cancer.  Other tests  such as free PSA, age specific reference ranges, PSA velocity and PSA  doubling time may be helpful especially in men less than 83 years  old.  Performed at [facility], [address] [place], [place]  [address]   Resulting Agency  [place] [facility] [place] [facility] [place] [facility] [place] [facility] [place] [facility] [place] [facility] [place] [facility]         Specimen Collected: 03/02/22 09:47 Last Resulted: 03/02/22 13:19      Lab Flowsheet    Order Details    View Encounter    Lab and Collection Details    Routing    Result History    View Encounter Conversation      CM=Additional comments      Result Care Coordination   Patient Communication   Add Comments   Seen Back to Top          Contains abnormal data Comprehensive metabolic panel Order: [id number] Status: Final result    Visible to patient: Yes (seen)    Next appt: 05/30/2022 at 01:15 PM in Oncology ([facility])    Dx: Prostate cancer ([place])    0 Result Notes           Component Ref Range & Units 2 mo ago (03/02/22) 5 mo ago (12/01/21) 5 mo ago (11/18/21) 8 mo ago (09/01/21) 11 mo ago (06/02/21) 1 yr ago (04/14/21) 1 yr ago (03/17/21)  Sodium 135 - 145 mmol/L 139  139  139  139  141  145 High  R  146 High  R   Potassium 3.5 - 5.1 mmol/L 3.8  3.8  3.6  3.7  4.0  3.7 R  4.3 R   Chloride 98 - 111 mmol/L 112 High   107  105  107  110  109 High  R  111 High  R   CO2 22 - 32 mmol/L 21 Low   21 Low   '24  23  23  20 '$ R  16 Low  R   Glucose, Bld 70  - 99 mg/dL 125 High   108 High  CM  189 High  CM  158 High  CM  102 High  CM  100 High  R  153 High  R   Comment: Glucose reference range applies only to samples taken after fasting for at least 8 hours.  BUN 8 - 23 mg/dL '17  17  22  16  12  12 '$ R  15 R   Creatinine, Ser 0.61 - 1.24 mg/dL 1.00  0.99  1.12  0.96  0.98  0.89 R  0.73 Low  R   Calcium 8.9 - 10.3 mg/dL 8.4 Low   8.5 Low   9.2  8.6 Low   9.1  9.3 R  8.2 Low  R   Total Protein 6.5 - 8.1 g/dL 6.7  6.6   6.4 Low   7.3     Albumin 3.5 - 5.0 g/dL 3.7  3.8   3.6  4.0     AST 15 - 41 U/L '30  26   30  30     '$ ALT 0 - 44 U/L '15  15   13  15     '$ Alkaline Phosphatase 38 - 126 U/L 56  57   63  70     Total Bilirubin 0.3 - 1.2 mg/dL 0.2 Low   0.1 Low    0.3  0.5     GFR, Estimated >60 mL/min >60  >60 CM  >60 CM  >60 CM  >60 CM     Comment: (NOTE)  Calculated using the CKD-EPI Creatinine Equation (2021)   Anion  gap 5 - '15 6  11 '$ CM  10 CM  9 CM  8 CM     Comment: Performed at [facility], [address] [place], [place] [address]  Resulting Agency  [facility] [place] [facility] [place] [facility] [place] [facility]         Specimen Collected: 03/02/22 09:47 Last Resulted: 03/02/22 10:18

## 2022-05-30 ENCOUNTER — Inpatient Hospital Stay: Payer: Medicare Other | Admitting: Pharmacist

## 2022-05-30 ENCOUNTER — Inpatient Hospital Stay: Payer: Medicare Other | Attending: Oncology

## 2022-05-30 ENCOUNTER — Other Ambulatory Visit: Payer: Self-pay

## 2022-05-30 ENCOUNTER — Inpatient Hospital Stay: Payer: Medicare Other

## 2022-05-30 ENCOUNTER — Encounter: Payer: Self-pay | Admitting: Pharmacist

## 2022-05-30 VITALS — BP 140/85 | HR 56 | Temp 98.3°F | Resp 16 | Ht 70.5 in | Wt 237.0 lb

## 2022-05-30 DIAGNOSIS — C61 Malignant neoplasm of prostate: Secondary | ICD-10-CM

## 2022-05-30 DIAGNOSIS — Z5111 Encounter for antineoplastic chemotherapy: Secondary | ICD-10-CM | POA: Diagnosis present

## 2022-05-30 DIAGNOSIS — C7951 Secondary malignant neoplasm of bone: Secondary | ICD-10-CM | POA: Diagnosis present

## 2022-05-30 DIAGNOSIS — D509 Iron deficiency anemia, unspecified: Secondary | ICD-10-CM

## 2022-05-30 DIAGNOSIS — Z79899 Other long term (current) drug therapy: Secondary | ICD-10-CM | POA: Diagnosis not present

## 2022-05-30 LAB — COMPREHENSIVE METABOLIC PANEL
ALT: 15 U/L (ref 0–44)
AST: 28 U/L (ref 15–41)
Albumin: 3.9 g/dL (ref 3.5–5.0)
Alkaline Phosphatase: 58 U/L (ref 38–126)
Anion gap: 7 (ref 5–15)
BUN: 19 mg/dL (ref 8–23)
CO2: 23 mmol/L (ref 22–32)
Calcium: 8.8 mg/dL — ABNORMAL LOW (ref 8.9–10.3)
Chloride: 109 mmol/L (ref 98–111)
Creatinine, Ser: 0.94 mg/dL (ref 0.61–1.24)
GFR, Estimated: >60 mL/min (ref >60)
Glucose, Bld: 86 mg/dL (ref 70–99)
Potassium: 4 mmol/L (ref 3.5–5.1)
Sodium: 139 mmol/L (ref 135–145)
Total Bilirubin: 0.5 mg/dL (ref 0.3–1.2)
Total Protein: 7.2 g/dL (ref 6.5–8.1)

## 2022-05-30 LAB — IRON AND TIBC
Iron: 51 ug/dL (ref 45–182)
Saturation Ratios: 14 % — ABNORMAL LOW (ref 17.9–39.5)
TIBC: 377 ug/dL (ref 250–450)
UIBC: 326 ug/dL

## 2022-05-30 LAB — CBC WITH DIFFERENTIAL/PLATELET
Abs Immature Granulocytes: 0.03 10*3/uL (ref 0.00–0.07)
Basophils Absolute: 0 10*3/uL (ref 0.0–0.1)
Basophils Relative: 1 %
Eosinophils Absolute: 0.1 10*3/uL (ref 0.0–0.5)
Eosinophils Relative: 2 %
HCT: 36.8 % — ABNORMAL LOW (ref 39.0–52.0)
Hemoglobin: 11.9 g/dL — ABNORMAL LOW (ref 13.0–17.0)
Immature Granulocytes: 0 %
Lymphocytes Relative: 22 %
Lymphs Abs: 1.7 10*3/uL (ref 0.7–4.0)
MCH: 28.5 pg (ref 26.0–34.0)
MCHC: 32.3 g/dL (ref 30.0–36.0)
MCV: 88.2 fL (ref 80.0–100.0)
Monocytes Absolute: 0.6 10*3/uL (ref 0.1–1.0)
Monocytes Relative: 8 %
Neutro Abs: 5.4 10*3/uL (ref 1.7–7.7)
Neutrophils Relative %: 67 %
Platelets: 205 10*3/uL (ref 150–400)
RBC: 4.17 MIL/uL — ABNORMAL LOW (ref 4.22–5.81)
RDW: 16.8 % — ABNORMAL HIGH (ref 11.5–15.5)
WBC: 7.9 10*3/uL (ref 4.0–10.5)
nRBC: 0 % (ref 0.0–0.2)

## 2022-05-30 LAB — PSA: Prostatic Specific Antigen: <0.01 ng/mL (ref 0.00–4.00)

## 2022-05-30 LAB — FERRITIN: Ferritin: 23 ng/mL — ABNORMAL LOW (ref 24–336)

## 2022-05-30 MED ORDER — LEUPROLIDE ACETATE (6 MONTH) 45 MG ~~LOC~~ KIT
45.0000 mg | PACK | Freq: Once | SUBCUTANEOUS | Status: AC
  Administered 2022-05-30: 45 mg via SUBCUTANEOUS
  Filled 2022-05-30: qty 45

## 2022-05-30 MED ORDER — DENOSUMAB 120 MG/1.7ML ~~LOC~~ SOLN
120.0000 mg | Freq: Once | SUBCUTANEOUS | Status: AC
  Administered 2022-05-30: 120 mg via SUBCUTANEOUS
  Filled 2022-05-30: qty 1.7

## 2022-05-30 NOTE — Progress Notes (Signed)
[facility]  [phone] [phone]  Patient Care Team: Rusty Aus, MD as PCP - General (Internal Medicine) Lloyd Huger, MD as Consulting Physician (Oncology)   Name of the patient: Andrew Callahan  [id number]  04-26-1934   Date of visit: 05/30/22  HPI: Patient is a 88 y.o. male with metastatic prostate cancer. Currently on treatment with Zytiga (abiraterone), ADT, and Xgeva.  Reason for Consult: Treatment follow-up    PAST MEDICAL HISTORY: Past Medical History:  Diagnosis Date   Acquired hypothyroidism    Aortic atherosclerosis (HCC)    Atrial fibrillation ([place])    Chronic constipation    CVA (cerebral vascular accident) ([place])    DDD (degenerative disc disease), cervical    Diabetes mellitus without complication ([place])    Diverticulosis    Elevated prostate specific antigen (PSA)    Family history of leukemia    Family history of lung cancer    History of kidney stones    History of pituitary adenoma    Hyperlipidemia    Hypertension    Hypogonadism in male    Hypothyroid    Hypothyroidism    IDA (iron deficiency anemia)    Lumbar disc disease    Nephrolithiasis    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 Mohs.   Stroke ([place])    throat / slight difficulty swallowing    HEMATOLOGY/ONCOLOGY HISTORY:  Oncology History  Prostate cancer ([place])  02/15/2020 Initial Diagnosis   Prostate cancer ([place])   02/17/2020 Cancer Staging   Staging form: Prostate, AJCC 8th Edition - Clinical stage from 02/17/2020: Stage IVB (cT2c, cN0, cM1b, PSA: 8.6, Grade Group: 5) - Signed by Lloyd Huger, MD on 02/17/2020    Genetic Testing   Negative genetic testing. No pathogenic variants identified on the [facility] Common  Hereditary Cancers Panel. VUS in [facility] identified. The report date is 04/07/2020.  The Common Hereditary Cancers Panel offered by [facility] includes sequencing and/or deletion duplication testing of the following 48 genes: APC, ATM, AXIN2, BARD1, BMPR1A, BRCA1, BRCA2, BRIP1, CDH1, CDKN2A (p14ARF), CDKN2A (p16INK4a), CKD4, CHEK2, CTNNA1, DICER1, EPCAM (Deletion/duplication testing only), GREM1 (promoter region deletion/duplication testing only), KIT, MEN1, MLH1, MSH2, MSH3, MSH6, MUTYH, NBN, NF1, NHTL1, PALB2, PDGFRA, PMS2, POLD1, POLE, PTEN, RAD50, RAD51C, RAD51D, RNF43, SDHB, SDHC, SDHD, SMAD4, SMARCA4. STK11, TP53, TSC1, TSC2, and VHL.  The following genes were evaluated for sequence changes only: SDHA and HOXB13 c.251G>A variant only.     ALLERGIES:  is allergic to metoprolol and rivaroxaban.  MEDICATIONS:  Current Outpatient Medications  Medication Sig Dispense Refill   abiraterone acetate (ZYTIGA) 250 MG tablet TAKE 3 TABLETS BY MOUTH 1 TIME A DAY. TAKE ON AN EMPTY STOMACH 1 HOUR BEFORE OR 2 HOURS AFTER A MEAL. 90 tablet 1   ACCU-CHEK GUIDE test strip daily. use as directed     Accu-Chek Softclix Lancets lancets daily.     aspirin EC 81 MG tablet Take 81 mg by mouth daily. Swallow whole.     B Complex-C (SUPER B COMPLEX PO) Take 1 capsule by mouth daily.      Blood Glucose Monitoring Suppl (ACCU-CHEK GUIDE) w/Device KIT See admin instructions.     Calcium-Magnesium-Vitamin D (CALCIUM 1200+D3 PO) Take 2 tablets by mouth in  the morning and at bedtime.     cetirizine (ZYRTEC) 10 MG tablet Take 10 mg by mouth daily as needed for allergies.     cetirizine (ZYRTEC) 10 MG tablet Take 1 tablet by mouth daily.     denosumab (XGEVA) 120 MG/1.7ML SOLN injection Inject 120 mg into the skin every 3 (three) months.     diltiazem (CARDIZEM) 30 MG tablet Take 30 mg by mouth daily as needed.     ELIQUIS 5 MG TABS tablet Take 5 mg by mouth 2 (two) times daily.     glipiZIDE (GLUCOTROL XL) 10 MG 24 hr tablet  Take 5 mg by mouth daily.     hydrocortisone (CORTEF) 10 MG tablet Take 10 mg by mouth 2 (two) times daily.      Iron, Ferrous Sulfate, 325 (65 Fe) MG TABS daily.     Leuprolide Acetate (ELIGARD [place]) Inject into the skin. 1 injection every 6 months.     levothyroxine (SYNTHROID) 50 MCG tablet Take 50 mcg by mouth daily before breakfast.     levothyroxine (SYNTHROID) 50 MCG tablet Take by mouth.     Misc Natural Products (OSTEO BI-FLEX ADV JOINT SHIELD PO) Take 1 tablet by mouth daily.      Multiple Vitamin (MULTIVITAMIN WITH MINERALS) TABS tablet Take 1 tablet by mouth daily. One-A-Day Men's 50+     polyethylene glycol (MIRALAX / GLYCOLAX) 17 g packet Take 17 g by mouth daily. W/coffee     Probiotic Product (ALIGN) 4 MG CAPS Take 4 mg by mouth daily.      triamcinolone cream (KENALOG) 0.1 % Apply 1 application topically as directed. Qd to bid up to 5 days per week to right hand until clear, then prn flares, avoid face, groin, axilla 80 g 1   valsartan (DIOVAN) 320 MG tablet Take 160 mg by mouth 2 (two) times daily.     acyclovir ointment (ZOVIRAX) 5 % Apply 1 application topically every 3 (three) hours. Apply to aa groin prn flares q 3 hours until resolved (Patient not taking: Reported on 03/02/2022) 30 g 11   No current facility-administered medications for this visit.    VITAL SIGNS: BP 140/85 (BP Location: Left Arm, Patient Position: Sitting, Cuff Size: Large)   Pulse (!) 56   Temp 98.3 F (36.8 C) (Tympanic)   Resp 16   Ht 5' 10.5" (1.791 m)   Wt 107.5 kg (237 lb)   SpO2 98%   BMI 33.53 kg/m  Filed Weights   05/30/22 1331  Weight: 107.5 kg (237 lb)    Estimated body mass index is 33.53 kg/m as calculated from the following:   Height as of this encounter: 5' 10.5" (1.791 m).   Weight as of this encounter: 107.5 kg (237 lb).  LABS: CBC:    Component Value Date/Time   WBC 7.9 05/30/2022 1316   HGB 11.9 (L) 05/30/2022 1316   HCT 36.8 (L) 05/30/2022 1316   PLT 205 05/30/2022  1316   MCV 88.2 05/30/2022 1316   NEUTROABS 5.4 05/30/2022 1316   LYMPHSABS 1.7 05/30/2022 1316   MONOABS 0.6 05/30/2022 1316   EOSABS 0.1 05/30/2022 1316   BASOSABS 0.0 05/30/2022 1316   Comprehensive Metabolic Panel:    Component Value Date/Time   NA 139 05/30/2022 1316   NA 145 (H) 04/14/2021 1402   K 4.0 05/30/2022 1316   CL 109 05/30/2022 1316   CO2 23 05/30/2022 1316   BUN 19 05/30/2022 1316   BUN 12 04/14/2021  1402   CREATININE 0.94 05/30/2022 1316   GLUCOSE 86 05/30/2022 1316   CALCIUM 8.8 (L) 05/30/2022 1316   AST 28 05/30/2022 1316   ALT 15 05/30/2022 1316   ALKPHOS 58 05/30/2022 1316   BILITOT 0.5 05/30/2022 1316   PROT 7.2 05/30/2022 1316   ALBUMIN 3.9 05/30/2022 1316     Present during today's visit: patient and his wife  Assessment and Plan: CMP/CBC reviewed, continue abiraterone 540m daily Patient asked about further decreasing his dose, he was told that he would wanted to keep him on the highest tolerated, so if he is tolerating the 5078mwe would not wanted to decrease further PSA pending if there is an increase we will need him to increase back to 75059maily Proceed with Xgeva (every 3 months) and Eligard (every 6 months) today  **Of note, patient is not prescribed prednisone with his abiraterone because he takes hydrocortisone daily   Oral Chemotherapy Side Effect/Intolerance:  No reported hot flashes, fatigue, or edema  Oral Chemotherapy Adherence: No reported missed doses No patient barriers to medication adherence identified.   New medications: No new medications  Medication Access Issues: no issues reported  Patient expressed understanding and was in agreement with this plan. He also understands that He can call clinic at any time with any questions, concerns, or complaints.   Follow-up plan: RTC in 3 months for labs/MD/Xgeva  Thank you for allowing me to participate in the care of this very pleasant patient.   Time Total: 15  mins  Visit consisted of counseling and education on dealing with issues of symptom management in the setting of serious and potentially life-threatening illness.Greater than 50%  of this time was spent counseling and coordinating care related to the above assessment and plan.  Signed by: AlyDarl PikesharmD, BCPS, BCOSalley SlaughterPP Hematology/Oncology Clinical Pharmacist Practitioner [place]/DB/AP [phone]/20/2023 4:20 PM

## 2022-05-31 ENCOUNTER — Telehealth: Payer: Self-pay | Admitting: Pharmacist

## 2022-05-31 DIAGNOSIS — C61 Malignant neoplasm of prostate: Secondary | ICD-10-CM

## 2022-05-31 MED ORDER — ABIRATERONE ACETATE 250 MG PO TABS: 500.0000 mg | ORAL_TABLET | Freq: Every day | ORAL | 2 refills | Status: DC

## 2022-05-31 NOTE — Telephone Encounter (Signed)
Oral Chemotherapy Pharmacist Encounter   Spoke with patient and his wife, let them know that his PSA is still <0.01. The plan is for him to continue on abiraterone '500mg'$  daily. An updated prescription was sent to [facility].    Darl Pikes, PharmD, BCPS, BCOP, CPP Hematology/Oncology Clinical Pharmacist [place]/DB/AP [facility] [phone]  05/31/2022 11:58 AM

## 2022-06-01 ENCOUNTER — Ambulatory Visit: Payer: Medicare Other | Admitting: Pharmacist

## 2022-06-02 ENCOUNTER — Ambulatory Visit: Payer: Medicare Other | Admitting: Nurse Practitioner

## 2022-06-02 ENCOUNTER — Other Ambulatory Visit: Payer: Medicare Other

## 2022-06-02 ENCOUNTER — Ambulatory Visit: Payer: Medicare Other | Admitting: Pharmacist

## 2022-06-02 ENCOUNTER — Ambulatory Visit: Payer: Medicare Other

## 2022-06-14 ENCOUNTER — Other Ambulatory Visit: Payer: Self-pay | Admitting: Physical Medicine & Rehabilitation

## 2022-06-14 DIAGNOSIS — M5441 Lumbago with sciatica, right side: Secondary | ICD-10-CM

## 2022-06-23 ENCOUNTER — Ambulatory Visit
Admission: RE | Admit: 2022-06-23 | Discharge: 2022-06-23 | Disposition: A | Discharge status: Home / Self Care | Payer: Medicare Other | Source: Ambulatory Visit | Attending: Physical Medicine & Rehabilitation | Admitting: Physical Medicine & Rehabilitation

## 2022-06-23 DIAGNOSIS — M5441 Lumbago with sciatica, right side: Secondary | ICD-10-CM | POA: Insufficient documentation

## 2022-07-04 ENCOUNTER — Encounter: Payer: Self-pay | Admitting: Dermatology

## 2022-07-04 ENCOUNTER — Ambulatory Visit (INDEPENDENT_AMBULATORY_CARE_PROVIDER_SITE_OTHER): Payer: Medicare Other | Admitting: Dermatology

## 2022-07-04 DIAGNOSIS — B078 Other viral warts: Secondary | ICD-10-CM

## 2022-07-04 DIAGNOSIS — L820 Inflamed seborrheic keratosis: Secondary | ICD-10-CM | POA: Diagnosis not present

## 2022-07-04 DIAGNOSIS — L280 Lichen simplex chronicus: Secondary | ICD-10-CM

## 2022-07-04 DIAGNOSIS — L281 Prurigo nodularis: Secondary | ICD-10-CM

## 2022-07-04 DIAGNOSIS — R21 Rash and other nonspecific skin eruption: Secondary | ICD-10-CM | POA: Diagnosis not present

## 2022-07-04 DIAGNOSIS — L821 Other seborrheic keratosis: Secondary | ICD-10-CM

## 2022-07-04 MED ORDER — TRIAMCINOLONE ACETONIDE 0.1 % EX OINT: TOPICAL_OINTMENT | CUTANEOUS | 1 refills | Status: DC

## 2022-07-04 MED ORDER — KETOCONAZOLE 2 % EX CREA: TOPICAL_CREAM | CUTANEOUS | 2 refills | Status: DC

## 2022-07-04 MED ORDER — HYDROCORTISONE 2.5 % EX CREA: TOPICAL_CREAM | CUTANEOUS | 2 refills | Status: DC

## 2022-07-04 NOTE — Progress Notes (Signed)
Follow-Up Visit   Subjective  Andrew Callahan is a 88 y.o. male who presents for the following: lesions (Check spots on right cheek, neck and left hand. Raised, rough. Used Compound W to area on hand, helped some) and Skin Problem (Patient states he has genital herpes, Dx ~45 years ago. Seems to be chronic for past 6 months. Taking Valacyclovir, using Acyclovir ointment and has used Metronidazole ).  The patient has spots, moles and lesions to be evaluated, some may be new or changing and the patient has concerns that these could be cancer.  Patient accompanied by wife who contributes to history.  The following portions of the chart were reviewed this encounter and updated as appropriate:  Tobacco  Allergies  Meds  Problems  Med Hx  Surg Hx  Fam Hx      Review of Systems: No other skin or systemic complaints except as noted in HPI or Assessment and Plan.   Objective  Well appearing patient in no apparent distress; mood and affect are within normal limits.  A focused examination was performed including face, neck, left hand, groin. Relevant physical exam findings are noted in the Assessment and Plan.  Pubic Mild erythema with otherwise normal appearing skin today. No signs/symptoms of HSV  Right Lateral Cheek x1 Erythematous keratotic or waxy stuck-on papule or plaque.  Left Dorsal Hand x1 Verrucous papules -- Discussed viral etiology and contagion.   Right Thenar Eminence Scaly pink lichenified plaque. without features suspicious for malignancy on dermoscopy    Assessment & Plan  Rash Pubic  C/w Intertrigo vs tinea cruris > LSC  Start Ketoconazole 2% cream twice daily to entire area.  If still itching after using Ketoconazole for 1 week add: Start Hydrocortisone 2.5% twice daily up to 2 weeks as needed for itching.   Stop using Metronidazole and Acyclovir topicals.   hydrocortisone 2.5 % cream - Pubic Apply twice daily to affected groin areas up to 2 weeks as  needed for itching  ketoconazole (NIZORAL) 2 % cream - Pubic Apply twice daily to affected groin areas as needed for itching.  Inflamed seborrheic keratosis Right Lateral Cheek x1  Symptomatic, irritating, patient would like treated.  Destruction of lesion - Right Lateral Cheek x1  Destruction method: cryotherapy   Informed consent: discussed and consent obtained   Lesion destroyed using liquid nitrogen: Yes   Outcome: patient tolerated procedure well with no complications   Post-procedure details: wound care instructions given   Additional details:  Prior to procedure, discussed risks of blister formation, small wound, skin dyspigmentation, or rare scar following cryotherapy. Recommend Vaseline ointment to treated areas while healing.   Other viral warts Left Dorsal Hand x1  With prurigo nodule  Discussed viral etiology and risk of spread.  Discussed multiple treatments may be required to clear warts.  Discussed possible post-treatment dyspigmentation and risk of recurrence.  Destruction of lesion - Left Dorsal Hand x1  Destruction method: cryotherapy   Informed consent: discussed and consent obtained   Lesion destroyed using liquid nitrogen: Yes   Outcome: patient tolerated procedure well with no complications   Post-procedure details: wound care instructions given   Additional details:  Prior to procedure, discussed risks of blister formation, small wound, skin dyspigmentation, or rare scar following cryotherapy. Recommend Vaseline ointment to treated areas while healing.   Lichen simplex chronicus Right Thenar Eminence  Overlying scar. No evidence of recurrent SCC, but consider Bx if not responding or if suspicious in future.  Counseled to  avoid picking/rubbing.  Triamcinolone 0.1% ointment once daily, cover with band-aid up to 2 weeks then switch to Vaseline and bandaid if not totally healed.  Avoid applying to face, groin, and axilla. Use as directed. Long-term use  can cause thinning of the skin.  Topical steroids (such as triamcinolone, fluocinolone, fluocinonide, mometasone, clobetasol, halobetasol, betamethasone, hydrocortisone) can cause thinning and lightening of the skin if they are used for too long in the same area. Your physician has selected the right strength medicine for your problem and area affected on the body. Please use your medication only as directed by your physician to prevent side effects.    triamcinolone ointment (KENALOG) 0.1 % - Right Thenar Eminence Apply once daily to affected area on hand, cover with band-aid, up to 2 weeks.   Seborrheic Keratoses - Stuck-on, waxy, tan-brown papules and/or plaques  - Benign-appearing - Discussed benign etiology and prognosis. - Observe - Call for any changes   Return in about 4 weeks (around 08/01/2022) for ISK Follow Up, Rash Follow Up, Wart Follow UP.  I, Emelia Salisbury, CMA, am acting as scribe for Forest Gleason, MD.  Documentation: I have reviewed the above documentation for accuracy and completeness, and I agree with the above.  Forest Gleason, MD

## 2022-07-04 NOTE — Patient Instructions (Addendum)
Cryotherapy Aftercare  Wash gently with soap and water everyday.   Apply Vaseline and Band-Aid daily until healed.    Groin: Start Ketoconazole 2% cream twice daily to entire area.  If still itching after using Ketoconazole for 1 week add:  Hydrocortisone 2.5% twice daily up to 2 weeks as needed for itching.   Stop using Metronidazole and Acyclovir topicals.    Area at right hand: Triamcinolone 0.1% ointment once daily, cover with band-aid up to 2 weeks then switch to Vaseline and bandaid if not totally healed.  Avoid applying to face, groin, and axilla. Use as directed. Long-term use can cause thinning of the skin.  Topical steroids (such as triamcinolone, fluocinolone, fluocinonide, mometasone, clobetasol, halobetasol, betamethasone, hydrocortisone) can cause thinning and lightening of the skin if they are used for too long in the same area. Your physician has selected the right strength medicine for your problem and area affected on the body. Please use your medication only as directed by your physician to prevent side effects.     Seborrheic Keratosis  What causes seborrheic keratoses? Seborrheic keratoses are harmless, common skin growths that first appear during adult life.  As time goes by, more growths appear.  Some people may develop a large number of them.  Seborrheic keratoses appear on both covered and uncovered body parts.  They are not caused by sunlight.  The tendency to develop seborrheic keratoses can be inherited.  They vary in color from skin-colored to gray, brown, or even black.  They can be either smooth or have a rough, warty surface.   Seborrheic keratoses are superficial and look as if they were stuck on the skin.  Under the microscope this type of keratosis looks like layers upon layers of skin.  That is why at times the top layer may seem to fall off, but the rest of the growth remains and re-grows.    Treatment Seborrheic keratoses do not need to be  treated, but can easily be removed in the office.  Seborrheic keratoses often cause symptoms when they rub on clothing or jewelry.  Lesions can be in the way of shaving.  If they become inflamed, they can cause itching, soreness, or burning.  Removal of a seborrheic keratosis can be accomplished by freezing, burning, or surgery. If any spot bleeds, scabs, or grows rapidly, please return to have it checked, as these can be an indication of a skin cancer.    Due to recent changes in healthcare laws, you may see results of your pathology and/or laboratory studies on MyChart before the doctors have had a chance to review them. We understand that in some cases there may be results that are confusing or concerning to you. Please understand that not all results are received at the same time and often the doctors may need to interpret multiple results in order to provide you with the best plan of care or course of treatment. Therefore, we ask that you please give [place] 2 business days to thoroughly review all your results before contacting the office for clarification. Should we see a critical lab result, you will be contacted sooner.   If You Need Anything After Your Visit  If you have any questions or concerns for your doctor, please call our main line at [phone] and press option 4 to reach your doctor's medical assistant. If no one answers, please leave a voicemail as directed and we will return your call as soon as possible. Messages left after 4 pm  will be answered the following business day.   You may also send [place] a message via [place]. We typically respond to MyChart messages within 1-2 business days.  For prescription refills, please ask your pharmacy to contact our office. Our fax number is [phone].  If you have an urgent issue when the clinic is closed that cannot wait until the next business day, you can page your doctor at the number below.    Please note that while we do our best to be  available for urgent issues outside of office hours, we are not available 24/7.   If you have an urgent issue and are unable to reach [place], you may choose to seek medical care at your doctor's office, retail clinic, urgent care center, or emergency room.  If you have a medical emergency, please immediately call 911 or go to the emergency department.  Pager Numbers  - Dr. Nehemiah Massed: [phone]  - Dr. Laurence Ferrari: [phone]  - Dr. Nicole Kindred: [phone]  In the event of inclement weather, please call our main line at [phone] for an update on the status of any delays or closures.  Dermatology Medication Tips: Please keep the boxes that topical medications come in in order to help keep track of the instructions about where and how to use these. Pharmacies typically print the medication instructions only on the boxes and not directly on the medication tubes.   If your medication is too expensive, please contact our office at [phone] option 4 or send [place] a message through [place].   We are unable to tell what your co-pay for medications will be in advance as this is different depending on your insurance coverage. However, we may be able to find a substitute medication at lower cost or fill out paperwork to get insurance to cover a needed medication.   If a prior authorization is required to get your medication covered by your insurance company, please allow [place] 1-2 business days to complete this process.  Drug prices often vary depending on where the prescription is filled and some pharmacies may offer cheaper prices.  The website www.goodrx.com contains coupons for medications through different pharmacies. The prices here do not account for what the cost may be with help from insurance (it may be cheaper with your insurance), but the website can give you the price if you did not use any insurance.  - You can print the associated coupon and take it with your prescription to the pharmacy.  -  You may also stop by our office during regular business hours and pick up a GoodRx coupon card.  - If you need your prescription sent electronically to a different pharmacy, notify our office through [facility] or by phone at [phone] option 4.     Si Usted Necesita Algo Despus de Su Visita  Tambin puede enviarnos un mensaje a travs de Pharmacist, community. Por lo general respondemos a los mensajes de MyChart en el transcurso de 1 a 2 das hbiles.  Para renovar recetas, por favor pida a su farmacia que se ponga en contacto con nuestra oficina. Harland Dingwall de fax es [place] [phone].  Si tiene un asunto urgente cuando la clnica est cerrada y que no puede esperar hasta el siguiente da hbil, puede llamar/localizar a su doctor(a) al nmero que aparece a continuacin.   Por favor, tenga en cuenta que aunque hacemos todo lo posible para estar disponibles para asuntos urgentes fuera del horario de oficina, no estamos disponibles las 24 horas  del da, los 7 das de la [place].   Si tiene un problema urgente y no puede comunicarse con nosotros, puede optar por buscar atencin mdica  en el consultorio de su doctor(a), en una clnica privada, en un centro de atencin urgente o en una sala de emergencias.  Si tiene Engineering geologist, por favor llame inmediatamente al 911 o vaya a la sala de emergencias.  Nmeros de bper  - Dr. Nehemiah Massed: [phone]  - Dra. Moye: [phone]  - Dra. Nicole Kindred: [phone]  En caso de inclemencias del [place], por favor llame a Johnsie Kindred principal al [phone] para una actualizacin sobre el [place] de cualquier retraso o cierre.  Consejos para la medicacin en dermatologa: Por favor, guarde las cajas en las que vienen los medicamentos de uso tpico para ayudarle a seguir las instrucciones sobre dnde y cmo usarlos. Las farmacias generalmente imprimen las instrucciones del medicamento slo en las cajas y no directamente en los tubos del  [place].   Si su medicamento es muy caro, por favor, pngase en contacto con Zigmund Daniel llamando al [phone] y presione la opcin 4 o envenos un mensaje a travs de Pharmacist, community.   No podemos decirle cul ser su copago por los medicamentos por adelantado ya que esto es diferente dependiendo de la cobertura de su seguro. Sin embargo, es posible que podamos encontrar un medicamento sustituto a Electrical engineer un formulario para que el seguro cubra el medicamento que se considera necesario.   Si se requiere una autorizacin previa para que su compaa de seguros [place] su medicamento, por favor permtanos de 1 a 2 das hbiles para completar este proceso.  Los precios de los medicamentos varan con frecuencia dependiendo del Environmental consultant de dnde se surte la receta y alguna farmacias pueden ofrecer precios ms baratos.  El sitio web www.goodrx.com tiene cupones para medicamentos de Airline pilot. Los precios aqu no tienen en cuenta lo que podra costar con la ayuda del seguro (puede ser ms barato con su seguro), pero el sitio web puede darle el precio si no utiliz Research scientist (physical sciences).  - Puede imprimir el cupn correspondiente y llevarlo con su receta a la farmacia.  - Tambin puede pasar por nuestra oficina durante el horario de atencin regular y Charity fundraiser una tarjeta de cupones de GoodRx.  - Si necesita que su receta se enve electrnicamente a una farmacia diferente, informe a nuestra oficina a travs de MyChart de [place] o por telfono llamando al [phone] y presione la opcin 4.

## 2022-08-01 ENCOUNTER — Ambulatory Visit (INDEPENDENT_AMBULATORY_CARE_PROVIDER_SITE_OTHER): Payer: Medicare Other | Admitting: Dermatology

## 2022-08-01 DIAGNOSIS — B079 Viral wart, unspecified: Secondary | ICD-10-CM

## 2022-08-01 DIAGNOSIS — L821 Other seborrheic keratosis: Secondary | ICD-10-CM

## 2022-08-01 DIAGNOSIS — B356 Tinea cruris: Secondary | ICD-10-CM | POA: Diagnosis not present

## 2022-08-01 DIAGNOSIS — A609 Anogenital herpesviral infection, unspecified: Secondary | ICD-10-CM

## 2022-08-01 DIAGNOSIS — R21 Rash and other nonspecific skin eruption: Secondary | ICD-10-CM

## 2022-08-01 DIAGNOSIS — D492 Neoplasm of unspecified behavior of bone, soft tissue, and skin: Secondary | ICD-10-CM

## 2022-08-01 NOTE — Progress Notes (Signed)
   Follow-Up Visit   Subjective  Joesiah Lonon is a 88 y.o. male who presents for the following: Follow-up (Patient here today for wart, ISK and rash follow up. Patient with wart at left dorsal hand tx'd with LN2 at last visit. ISK at right lateral cheek tx'd with LN2 at last visit. Rash at pubic area improved, treated with ketoconazole and HC 2.5% cream. ).  Patient accompanied by wife who contributes to history.   Patient also treated for [facility] at right hand, improved after using TMC 0.1% cream. 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   Patient has been treated for herpes in the past and was prescribed valacyclovir by PCP.   The following portions of the chart were reviewed this encounter and updated as appropriate:   Tobacco  Allergies  Meds  Problems  Med Hx  Surg Hx  Fam Hx      Review of Systems:  No other skin or systemic complaints except as noted in HPI or Assessment and Plan.  Objective  Well appearing patient in no apparent distress; mood and affect are within normal limits.  A focused examination was performed including neck, hands, pubic, face. Relevant physical exam findings are noted in the Assessment and Plan.  Left Dorsal Hand 1.1 cm pink plaque R/o SCC vs Verruca vs Prurigo     Pubic Mild erythema  Pubic Clear today    Assessment & Plan  Neoplasm of skin Left Dorsal Hand  Skin / nail biopsy Type of biopsy: tangential   Informed consent: discussed and consent obtained   Timeout: patient name, date of birth, surgical site, and procedure verified   Procedure prep:  Patient was prepped and draped in usual sterile fashion Prep type:  Isopropyl alcohol Anesthesia: the lesion was anesthetized in a standard fashion   Anesthetic:  1% lidocaine w/ epinephrine 1-100,000 buffered w/ 8.4% NaHCO3 Instrument used: flexible razor blade   Hemostasis achieved with: aluminum chloride   Outcome: patient tolerated procedure well   Post-procedure details:  wound care instructions given   Additional details:  Petrolatum and a pressure bandage applied  Specimen 1 - Surgical pathology Differential Diagnosis: R/o SCC vs Verruca vs Prurigo  Check Margins: No 1.1 cm pink plaque   Tinea cruris Pubic  Vs intertrigo  Continue ketoconazole 2% cream twice daily for 1 more week. 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 Currently well-controlled.  Discontinue valacyclovir.   May restart valacyclovir 1000 mg once daily x 5 days with outbreaks. Take with a large glass of water.   Seborrheic Keratoses - Stuck-on, waxy, tan-brown papules and/or plaques  - Benign-appearing - Discussed benign etiology and prognosis. - Observe - Call for any changes  Return for TBSE.  Graciella Belton, RMA, am acting as scribe for Forest Gleason, MD .   Documentation: I have reviewed the above documentation for accuracy and completeness, and I agree with the above.  Forest Gleason, MD

## 2022-08-01 NOTE — Patient Instructions (Addendum)
Continue ketoconazole 2% cream twice daily for 1 more week to rash at groin.  Discontinue valacyclovir.  May restart valacyclovir 1000 mg once daily x 5 days with outbreaks.   Seborrheic Keratosis  What causes seborrheic keratoses? Seborrheic keratoses are harmless, common skin growths that first appear during adult life.  As time goes by, more growths appear.  Some people may develop a large number of them.  Seborrheic keratoses appear on both covered and uncovered body parts.  They are not caused by sunlight.  The tendency to develop seborrheic keratoses can be inherited.  They vary in color from skin-colored to gray, brown, or even black.  They can be either smooth or have a rough, warty surface.   Seborrheic keratoses are superficial and look as if they were stuck on the skin.  Under the microscope this type of keratosis looks like layers upon layers of skin.  That is why at times the top layer may seem to fall off, but the rest of the growth remains and re-grows.    Treatment Seborrheic keratoses do not need to be treated, but can easily be removed in the office.  Seborrheic keratoses often cause symptoms when they rub on clothing or jewelry.  Lesions can be in the way of shaving.  If they become inflamed, they can cause itching, soreness, or burning.  Removal of a seborrheic keratosis can be accomplished by freezing, burning, or surgery. If any spot bleeds, scabs, or grows rapidly, please return to have it checked, as these can be an indication of a skin cancer.  Wound Care Instructions  Cleanse wound gently with soap and water once a day then pat dry with clean gauze. Apply a thin coat of Petrolatum (petroleum jelly, "Vaseline") over the wound (unless you have an allergy to this). We recommend that you use a new, sterile tube of Vaseline. Do not pick or remove scabs. Do not remove the yellow or white "healing tissue" from the base of the wound.  Cover the wound with fresh, clean, nonstick  gauze and secure with paper tape. You may use Band-Aids in place of gauze and tape if the wound is small enough, but would recommend trimming much of the tape off as there is often too much. Sometimes Band-Aids can irritate the skin.  You should call the office for your biopsy report after 1 week if you have not already been contacted.  If you experience any problems, such as abnormal amounts of bleeding, swelling, significant bruising, significant pain, or evidence of infection, please call the office immediately.  FOR ADULT SURGERY PATIENTS: If you need something for pain relief you may take 1 extra strength Tylenol (acetaminophen) AND 2 Ibuprofen ('200mg'$  each) together every 4 hours as needed for pain. (do not take these if you are allergic to them or if you have a reason you should not take them.) Typically, you may only need pain medication for 1 to 3 days.   Due to recent changes in healthcare laws, you may see results of your pathology and/or laboratory studies on MyChart before the doctors have had a chance to review them. We understand that in some cases there may be results that are confusing or concerning to you. Please understand that not all results are received at the same time and often the doctors may need to interpret multiple results in order to provide you with the best plan of care or course of treatment. Therefore, we ask that you please give [place] 2 business  days to thoroughly review all your results before contacting the office for clarification. Should we see a critical lab result, you will be contacted sooner.   If You Need Anything After Your Visit  If you have any questions or concerns for your doctor, please call our main line at [phone] and press option 4 to reach your doctor's medical assistant. If no one answers, please leave a voicemail as directed and we will return your call as soon as possible. Messages left after 4 pm will be answered the following business day.    You may also send [place] a message via [place]. We typically respond to MyChart messages within 1-2 business days.  For prescription refills, please ask your pharmacy to contact our office. Our fax number is [phone].  If you have an urgent issue when the clinic is closed that cannot wait until the next business day, you can page your doctor at the number below.    Please note that while we do our best to be available for urgent issues outside of office hours, we are not available 24/7.   If you have an urgent issue and are unable to reach [place], you may choose to seek medical care at your doctor's office, retail clinic, urgent care center, or emergency room.  If you have a medical emergency, please immediately call 911 or go to the emergency department.  Pager Numbers  - Dr. Nehemiah Massed: [phone]  - Dr. Laurence Ferrari: [phone]  - Dr. Nicole Kindred: [phone]  In the event of inclement weather, please call our main line at [phone] for an update on the status of any delays or closures.  Dermatology Medication Tips: Please keep the boxes that topical medications come in in order to help keep track of the instructions about where and how to use these. Pharmacies typically print the medication instructions only on the boxes and not directly on the medication tubes.   If your medication is too expensive, please contact our office at [phone] option 4 or send [place] a message through [place].   We are unable to tell what your co-pay for medications will be in advance as this is different depending on your insurance coverage. However, we may be able to find a substitute medication at lower cost or fill out paperwork to get insurance to cover a needed medication.   If a prior authorization is required to get your medication covered by your insurance company, please allow [place] 1-2 business days to complete this process.  Drug prices often vary depending on where the prescription is filled and some  pharmacies may offer cheaper prices.  The website www.goodrx.com contains coupons for medications through different pharmacies. The prices here do not account for what the cost may be with help from insurance (it may be cheaper with your insurance), but the website can give you the price if you did not use any insurance.  - You can print the associated coupon and take it with your prescription to the pharmacy.  - You may also stop by our office during regular business hours and pick up a GoodRx coupon card.  - If you need your prescription sent electronically to a different pharmacy, notify our office through [facility] or by phone at [phone] option 4.     Si Usted Necesita Algo Despus de Su Visita  Tambin puede enviarnos un mensaje a travs de Pharmacist, community. Por lo general respondemos a los mensajes de MyChart en el transcurso de 1 a 2 das hbiles.  Para renovar recetas, por favor pida a su farmacia que se ponga en contacto con nuestra oficina. Harland Dingwall de fax es [place] [phone].  Si tiene un asunto urgente cuando la clnica est cerrada y que no puede esperar hasta el siguiente da hbil, puede llamar/localizar a su doctor(a) al nmero que aparece a continuacin.   Por favor, tenga en cuenta que aunque hacemos todo lo posible para estar disponibles para asuntos urgentes fuera del horario de [place], no estamos disponibles las 24 horas del da, los 7 das de la [place].   Si tiene un problema urgente y no puede comunicarse con nosotros, puede optar por buscar atencin mdica  en el consultorio de su doctor(a), en una clnica privada, en un centro de atencin urgente o en una sala de emergencias.  Si tiene Engineering geologist, por favor llame inmediatamente al 911 o vaya a la sala de emergencias.  Nmeros de bper  - Dr. Nehemiah Massed: [phone]  - Dra. Moye: [phone]  - Dra. Nicole Kindred: [phone]  En caso de inclemencias del [place], por favor llame a Johnsie Kindred  principal al [phone] para una actualizacin sobre el [place] de cualquier retraso o cierre.  Consejos para la medicacin en dermatologa: Por favor, guarde las cajas en las que vienen los medicamentos de uso tpico para ayudarle a seguir las instrucciones sobre dnde y cmo usarlos. Las farmacias generalmente imprimen las instrucciones del medicamento slo en las cajas y no directamente en los tubos del [place].   Si su medicamento es muy caro, por favor, pngase en contacto con Zigmund Daniel llamando al [phone] y presione la opcin 4 o envenos un mensaje a travs de Pharmacist, community.   No podemos decirle cul ser su copago por los medicamentos por adelantado ya que esto es diferente dependiendo de la cobertura de su seguro. Sin embargo, es posible que podamos encontrar un medicamento sustituto a Electrical engineer un formulario para que el seguro cubra el medicamento que se considera necesario.   Si se requiere una autorizacin previa para que su compaa de seguros [place] su medicamento, por favor permtanos de 1 a 2 das hbiles para completar este proceso.  Los precios de los medicamentos varan con frecuencia dependiendo del Environmental consultant de dnde se surte la receta y alguna farmacias pueden ofrecer precios ms baratos.  El sitio web www.goodrx.com tiene cupones para medicamentos de Airline pilot. Los precios aqu no tienen en cuenta lo que podra costar con la ayuda del seguro (puede ser ms barato con su seguro), pero el sitio web puede darle el precio si no utiliz Research scientist (physical sciences).  - Puede imprimir el cupn correspondiente y llevarlo con su receta a la farmacia.  - Tambin puede pasar por nuestra oficina durante el horario de atencin regular y Charity fundraiser una tarjeta de cupones de GoodRx.  - Si necesita que su receta se enve electrnicamente a una farmacia diferente, informe a nuestra oficina a travs de MyChart de [place] o por telfono llamando al [phone] y presione la opcin  4.

## 2022-08-02 ENCOUNTER — Telehealth: Payer: Self-pay

## 2022-08-02 NOTE — Telephone Encounter (Signed)
-----   Message from [place], MD sent at 08/02/2022  4:54 PM EDT ----- Skin , left dorsal hand VERRUCA VULGARIS, IRRITATED  This is a WART caused by the human papilloma virus. It is not dangerous but is contagious and can spread to other areas of skin or other people if it is not completely gone. No additional treatment is needed. However, if it comes back, we can freeze it in clinic with liquid nitrogen (a quick in office procedure) or you can also treat it at home with an over the counter salicylic wart treatment (slower).  Please call the office at [phone] or message [place] if you have have any questions.   MAs please call. Thank you!

## 2022-08-02 NOTE — Telephone Encounter (Signed)
Discussed pathology results with patient's wife. VV. Advised contagious, can recur. Patient's wife voiced understanding. JP

## 2022-08-07 ENCOUNTER — Encounter: Payer: Self-pay | Admitting: Dermatology

## 2022-08-15 ENCOUNTER — Other Ambulatory Visit: Payer: Self-pay | Admitting: Pharmacist

## 2022-08-15 ENCOUNTER — Other Ambulatory Visit (HOSPITAL_COMMUNITY): Payer: Self-pay

## 2022-08-15 DIAGNOSIS — C61 Malignant neoplasm of prostate: Secondary | ICD-10-CM

## 2022-08-15 MED ORDER — ABIRATERONE ACETATE 250 MG PO TABS: 500.0000 mg | ORAL_TABLET | Freq: Every day | ORAL | 2 refills | Status: DC

## 2022-08-15 NOTE — Progress Notes (Signed)
Received notification from [facility] that patient was now required to use [facility] to fill his abiraterone. Rx redirected to [facility].

## 2022-08-30 ENCOUNTER — Other Ambulatory Visit: Payer: Medicare Other

## 2022-08-30 ENCOUNTER — Ambulatory Visit: Payer: Medicare Other | Admitting: Medical Oncology

## 2022-08-30 ENCOUNTER — Ambulatory Visit: Payer: Medicare Other

## 2022-08-30 ENCOUNTER — Ambulatory Visit: Payer: Medicare Other | Admitting: Oncology

## 2022-08-31 ENCOUNTER — Ambulatory Visit: Payer: Medicare Other | Admitting: Oncology

## 2022-08-31 ENCOUNTER — Encounter: Payer: Self-pay | Admitting: Oncology

## 2022-08-31 ENCOUNTER — Other Ambulatory Visit: Payer: Medicare Other

## 2022-08-31 ENCOUNTER — Inpatient Hospital Stay: Payer: Medicare Other | Attending: Oncology

## 2022-08-31 ENCOUNTER — Ambulatory Visit: Payer: Medicare Other

## 2022-08-31 ENCOUNTER — Inpatient Hospital Stay: Payer: Medicare Other

## 2022-08-31 ENCOUNTER — Ambulatory Visit: Payer: Medicare Other | Admitting: Medical Oncology

## 2022-08-31 ENCOUNTER — Inpatient Hospital Stay (HOSPITAL_BASED_OUTPATIENT_CLINIC_OR_DEPARTMENT_OTHER): Payer: Medicare Other | Admitting: Oncology

## 2022-08-31 VITALS — BP 156/89 | HR 54 | Temp 97.3°F | Wt 241.0 lb

## 2022-08-31 DIAGNOSIS — D509 Iron deficiency anemia, unspecified: Secondary | ICD-10-CM

## 2022-08-31 DIAGNOSIS — C61 Malignant neoplasm of prostate: Secondary | ICD-10-CM | POA: Diagnosis present

## 2022-08-31 DIAGNOSIS — C7951 Secondary malignant neoplasm of bone: Secondary | ICD-10-CM | POA: Diagnosis present

## 2022-08-31 LAB — CBC WITH DIFFERENTIAL/PLATELET
Abs Immature Granulocytes: 0.03 10*3/uL (ref 0.00–0.07)
Basophils Absolute: 0.1 10*3/uL (ref 0.0–0.1)
Basophils Relative: 1 %
Eosinophils Absolute: 0.1 10*3/uL (ref 0.0–0.5)
Eosinophils Relative: 1 %
HCT: 39 % (ref 39.0–52.0)
Hemoglobin: 13.2 g/dL (ref 13.0–17.0)
Immature Granulocytes: 0 %
Lymphocytes Relative: 23 %
Lymphs Abs: 1.7 10*3/uL (ref 0.7–4.0)
MCH: 30.3 pg (ref 26.0–34.0)
MCHC: 33.8 g/dL (ref 30.0–36.0)
MCV: 89.7 fL (ref 80.0–100.0)
Monocytes Absolute: 0.6 10*3/uL (ref 0.1–1.0)
Monocytes Relative: 8 %
Neutro Abs: 5.2 10*3/uL (ref 1.7–7.7)
Neutrophils Relative %: 67 %
Platelets: 192 10*3/uL (ref 150–400)
RBC: 4.35 MIL/uL (ref 4.22–5.81)
RDW: 14.4 % (ref 11.5–15.5)
WBC: 7.6 10*3/uL (ref 4.0–10.5)
nRBC: 0 % (ref 0.0–0.2)

## 2022-08-31 LAB — COMPREHENSIVE METABOLIC PANEL
ALT: 15 U/L (ref 0–44)
AST: 27 U/L (ref 15–41)
Albumin: 3.9 g/dL (ref 3.5–5.0)
Alkaline Phosphatase: 59 U/L (ref 38–126)
Anion gap: 7 (ref 5–15)
BUN: 19 mg/dL (ref 8–23)
CO2: 22 mmol/L (ref 22–32)
Calcium: 8.9 mg/dL (ref 8.9–10.3)
Chloride: 110 mmol/L (ref 98–111)
Creatinine, Ser: 0.86 mg/dL (ref 0.61–1.24)
GFR, Estimated: >60 mL/min (ref >60)
Glucose, Bld: 173 mg/dL — ABNORMAL HIGH (ref 70–99)
Potassium: 4.1 mmol/L (ref 3.5–5.1)
Sodium: 139 mmol/L (ref 135–145)
Total Bilirubin: 0.4 mg/dL (ref 0.3–1.2)
Total Protein: 6.9 g/dL (ref 6.5–8.1)

## 2022-08-31 LAB — IRON AND TIBC
Iron: 65 ug/dL (ref 45–182)
Saturation Ratios: 19 % (ref 17.9–39.5)
TIBC: 343 ug/dL (ref 250–450)
UIBC: 278 ug/dL

## 2022-08-31 LAB — FERRITIN: Ferritin: 23 ng/mL — ABNORMAL LOW (ref 24–336)

## 2022-08-31 LAB — PSA: Prostatic Specific Antigen: <0.01 ng/mL (ref 0.00–4.00)

## 2022-08-31 MED ORDER — DENOSUMAB 120 MG/1.7ML ~~LOC~~ SOLN
120.0000 mg | Freq: Once | SUBCUTANEOUS | Status: AC
  Administered 2022-08-31: 120 mg via SUBCUTANEOUS
  Filled 2022-08-31: qty 1.7

## 2022-08-31 NOTE — Progress Notes (Signed)
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  INTERVAL HISTORY: Patient returns to clinic today for repeat laboratory work, further evaluation, and continuation of [name].  He continues to have chronic weakness and fatigue, but otherwise feels well.  He has residual right-sided weakness from previous CVA.  He needs to have chronic frequency and nocturia.  He has no neurologic complaints.  He denies any recent fevers or illnesses.  He has a good appetite and denies weight loss.  He has no chest pain, shortness of breath, cough, or hemoptysis.  He denies any nausea, vomiting, constipation, or diarrhea.  He has no other urologic complaints.  Patient offers no further specific complaints today.  REVIEW OF SYSTEMS:   Review of Systems  Constitutional:  Positive for malaise/fatigue. Negative for fever and weight loss.  Respiratory: Negative.  Negative for cough and hemoptysis.   Cardiovascular: Negative.  Negative for chest pain and leg swelling.  Gastrointestinal: Negative.  Negative for abdominal pain.  Genitourinary:  Positive for frequency. Negative for hematuria.  Musculoskeletal: Negative.  Negative for back pain.  Skin: Negative.  Negative for rash.  Neurological:  Positive for weakness. Negative for dizziness, sensory change, focal weakness and headaches.  Psychiatric/Behavioral: Negative.  The patient is not nervous/anxious.     As per HPI. Otherwise, a complete review of systems is negative.  PAST MEDICAL HISTORY: Past Medical History:  Diagnosis Date   Acquired hypothyroidism    Aortic atherosclerosis (HCC)    Atrial fibrillation (HCC)    Chronic  constipation    CVA (cerebral vascular accident) ([place])    DDD (degenerative disc disease), cervical    Diabetes mellitus without complication ([place])    Diverticulosis    Elevated prostate specific antigen (PSA)    Family history of leukemia    Family history of lung cancer    History of kidney stones    History of pituitary adenoma    Hyperlipidemia    Hypertension    Hypogonadism in male    Hypothyroid    Hypothyroidism    IDA (iron deficiency anemia)    Lumbar disc disease    Nephrolithiasis    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 Mohs.   Stroke ([place])    throat / slight difficulty swallowing    PAST SURGICAL HISTORY: Past Surgical History:  Procedure Laterality Date   BACK SURGERY  1997   L3, 4, 5 laminectomy    BACK SURGERY  1998   hard wire removed, fusion of L3-5   BACK SURGERY  2009   laminotomy/foraminotomy w/ decompression of nerve root, facet thermal ablation L2-3.    COLONOSCOPY     COLONOSCOPY WITH PROPOFOL N/A 02/04/2021   Procedure: COLONOSCOPY WITH PROPOFOL;  Surgeon: Lesly Rubenstein, MD;  Location: [facility] ENDOSCOPY;  Service: Endoscopy;  Laterality: N/A;  STAT CBC BEFORE PROCEDURE   ESOPHAGOGASTRODUODENOSCOPY (EGD) WITH PROPOFOL N/A 02/04/2021   Procedure: ESOPHAGOGASTRODUODENOSCOPY (EGD) WITH PROPOFOL;  Surgeon: Lesly Rubenstein, MD;  Location: [facility] ENDOSCOPY;  Service: Endoscopy;  Laterality: N/A;   HERNIA REPAIR     HOLEP-LASER ENUCLEATION OF THE PROSTATE WITH MORCELLATION N/A 06/18/2020   Procedure: HOLEP-LASER ENUCLEATION OF THE PROSTATE WITH MORCELLATION;  Surgeon: Billey Co, MD;  Location: [facility] ORS;  Service: Urology;  Laterality: N/A;   JOINT REPLACEMENT     left knee partial   KIDNEY STONE SURGERY  2010   KNEE SURGERY Left 2007   Arthroscopic  partial knee replacement, replaced medial side    MOHS SURGERY Right 2014   squamous cell carcinoma   NECK SURGERY  2008   fusion with hardwire in place C3 through C7    pituitary adenoma  1994   benign   PROSTATE BIOPSY N/A 01/01/2020   Procedure: PROSTATE BIOPSY URO NAV FUSION;  Surgeon: Royston Cowper, MD;  Location: [facility] ORS;  Service: Urology;  Laterality: N/A;   [place] SURGERY  2016   fusion L4    TONSILLECTOMY      FAMILY HISTORY: Family History  Problem Relation Age of Onset   Stroke Mother    Heart attack Father    Leukemia Sister 6   Lung cancer Brother 58    ADVANCED DIRECTIVES (Y/N):  N  HEALTH MAINTENANCE: Social History   Tobacco Use   Smoking status: Former   Smokeless tobacco: Never   Tobacco comments:    quit 50 years ago  Vaping Use   Vaping Use: Never used  Substance Use Topics   Alcohol use: Yes    Comment: rarely   Drug use: Never     Colonoscopy:  PAP:  Bone density:  Lipid panel:  Allergies  Allergen Reactions   Metoprolol Other (See Comments)    dizziness    Rivaroxaban Other (See Comments)    hematuria     Current Outpatient Medications  Medication Sig Dispense Refill   abiraterone acetate (ZYTIGA) 250 MG tablet Take 2 tablets (500 mg total) by mouth daily. Take on an empty stomach 1 hour before or 2 hours after a meal 60 tablet 2   ACCU-CHEK GUIDE test strip daily. use as directed     Accu-Chek Softclix Lancets lancets daily.     B Complex-C (SUPER B COMPLEX PO) Take 1 capsule by mouth daily.      Blood Glucose Monitoring Suppl (ACCU-CHEK GUIDE) w/Device KIT See admin instructions.     Calcium-Magnesium-Vitamin D (CALCIUM 1200+D3 PO) Take 2 tablets by mouth in the morning and at bedtime.     cetirizine (ZYRTEC) 10 MG tablet Take 1 tablet by mouth daily.     denosumab (XGEVA) 120 MG/1.7ML SOLN injection Inject 120 mg into the skin every 3 (three) months.     diltiazem (CARDIZEM) 30 MG tablet Take 30 mg by  mouth daily as needed.     ELIQUIS 5 MG TABS tablet Take 5 mg by mouth 2 (two) times daily.     hydrocortisone (CORTEF) 10 MG tablet Take 10 mg by mouth 2 (two) times daily.      hydrocortisone 2.5 % cream Apply twice daily to affected groin areas up to 2 weeks as needed for itching 30 g 2   Iron, Ferrous Sulfate, 325 (65 Fe) MG TABS daily.     ketoconazole (NIZORAL) 2 % cream Apply twice daily to affected groin areas as needed for itching. 60 g 2   Leuprolide Acetate (ELIGARD [place]) Inject into the skin. 1 injection every 6 months.     levothyroxine (SYNTHROID)  50 MCG tablet Take by mouth.     Misc Natural Products (OSTEO BI-FLEX ADV JOINT SHIELD PO) Take 1 tablet by mouth daily.      Multiple Vitamin (MULTIVITAMIN WITH MINERALS) TABS tablet Take 1 tablet by mouth daily. One-A-Day Men's 50+     polyethylene glycol (MIRALAX / GLYCOLAX) 17 g packet Take 17 g by mouth daily. W/coffee     Probiotic Product (ALIGN) 4 MG CAPS Take 4 mg by mouth daily.      valsartan (DIOVAN) 320 MG tablet Take 160 mg by mouth 2 (two) times daily.     acyclovir ointment (ZOVIRAX) 5 % Apply 1 application topically every 3 (three) hours. Apply to aa groin prn flares q 3 hours until resolved (Patient not taking: Reported on 03/02/2022) 30 g 11   aspirin EC 81 MG tablet Take 81 mg by mouth daily. Swallow whole.     cetirizine (ZYRTEC) 10 MG tablet Take 10 mg by mouth daily as needed for allergies.     glipiZIDE (GLUCOTROL XL) 10 MG 24 hr tablet Take 5 mg by mouth daily.     levothyroxine (SYNTHROID) 50 MCG tablet Take 50 mcg by mouth daily before breakfast.     triamcinolone cream (KENALOG) 0.1 % Apply 1 application topically as directed. Qd to bid up to 5 days per week to right hand until clear, then prn flares, avoid face, groin, axilla 80 g 1   triamcinolone ointment (KENALOG) 0.1 % Apply once daily to affected area on hand, cover with band-aid, up to 2 weeks. 30 g 1   valACYclovir (VALTREX) 1000 MG tablet Take 1 tablet by  mouth 2 (two) times daily.     No current facility-administered medications for this visit.    OBJECTIVE: Vitals:   08/31/22 1356 08/31/22 1444  BP: (!) 191/92 (!) 156/89  Pulse: (!) 54   Temp: (!) 97.3 F (36.3 C)       Body mass index is 34.09 kg/m.    ECOG FS:1 - Symptomatic but completely ambulatory  General: Well-developed, well-nourished, no acute distress. Eyes: Pink conjunctiva, anicteric sclera. HEENT: Normocephalic, moist mucous membranes. Lungs: No audible wheezing or coughing. Heart: Regular rate and rhythm. Abdomen: Soft, nontender, no obvious distention. Musculoskeletal: No edema, cyanosis, or clubbing. Neuro: Alert, answering all questions appropriately. Cranial nerves grossly intact. Skin: No rashes or petechiae noted. Psych: Normal affect.   LAB RESULTS:  Lab Results  Component Value Date   NA 139 08/31/2022   K 4.1 08/31/2022   CL 110 08/31/2022   CO2 22 08/31/2022   GLUCOSE 173 (H) 08/31/2022   BUN 19 08/31/2022   CREATININE 0.86 08/31/2022   CALCIUM 8.9 08/31/2022   PROT 6.9 08/31/2022   ALBUMIN 3.9 08/31/2022   AST 27 08/31/2022   ALT 15 08/31/2022   ALKPHOS 59 08/31/2022   BILITOT 0.4 08/31/2022   GFRNONAA >60 08/31/2022   GFRAA >60 09/01/2020    Lab Results  Component Value Date   WBC 7.6 08/31/2022   NEUTROABS 5.2 08/31/2022   HGB 13.2 08/31/2022   HCT 39.0 08/31/2022   MCV 89.7 08/31/2022   PLT 192 08/31/2022     STUDIES: No results found.  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  PLAN:    1. 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  Patient's PSA was initially greater than 8.0, but trended down and has been undetectable since June 2022.  Today's result is pending.  Continue 500 mg Zytiga (2 tabs) per day.  Continue Xgeva every 3 months and Eligard every 6 months.  Proceed with Xgeva only today.   Return to clinic in 3 months with evaluation by clinical pharmacy and continuation of Xgeva and Eligard.  Patient will then return to clinic in 6 months for MD evaluation and continuation of treatment.  2.  Bony metastasis: Continue Xgeva every 3 months as above.  Patient's most recent calcium levels are adequate to proceed as scheduled.   3.  Urinary frequency/hematuria: Chronic and unchanged.  Continue evaluation and treatment per urology.  4.  Hot flashes: Patient does not complain of this today.  Likely second Eligard, monitor. 5.  Anemia: Resolved.  Patient's hemoglobin and iron stores are within normal limits except for a mildly decreased ferritin level.  Patient does not require additional IV Feraheme.  Recent colonoscopy and EGD did not reveal any significant pathology.  He last received 510 mg of IV Feraheme on February 25, 2021.   6.  Weakness and fatigue: Chronic and unchanged.   7.  Atrial fibrillation: Continue follow-up with cardiology as indicated.  Patient is now on Eliquis.  Patient expressed understanding and was in agreement with this plan. He also understands that He can call clinic at any time with any questions, concerns, or complaints.    Cancer Staging  Prostate cancer [facility]) Staging form: Prostate, AJCC 8th Edition - Clinical stage from 02/17/2020: Stage IVB (cT2c, cN0, cM1b, PSA: 8.6, Grade Group: 5) - Signed by Lloyd Huger, MD on 02/17/2020 Prostate specific antigen (PSA) range: Less than 10 Histologic grading system: 5 grade system   Lloyd Huger, MD   08/31/2022 4:35 PM

## 2022-09-13 ENCOUNTER — Ambulatory Visit: Payer: Medicare Other | Admitting: Urology

## 2022-09-14 ENCOUNTER — Encounter: Payer: Self-pay | Admitting: Urology

## 2022-09-14 ENCOUNTER — Ambulatory Visit (INDEPENDENT_AMBULATORY_CARE_PROVIDER_SITE_OTHER): Payer: Medicare Other | Admitting: Urology

## 2022-09-14 VITALS — BP 168/94 | HR 54

## 2022-09-14 DIAGNOSIS — C61 Malignant neoplasm of prostate: Secondary | ICD-10-CM | POA: Diagnosis not present

## 2022-09-14 DIAGNOSIS — N3281 Overactive bladder: Secondary | ICD-10-CM | POA: Diagnosis not present

## 2022-09-14 LAB — BLADDER SCAN AMB NON-IMAGING

## 2022-09-14 NOTE — Patient Instructions (Signed)
Avoid fluids that can irritate your bladder, especially in the afternoon.  Coffee, tea, diet drinks, and soda all cause urinary frequency.  Minimize fluids 4 hours prior to bedtime, and try to urinate twice before going to bed.  You can also elevate your legs in the mid afternoon which can help prevent swelling that contributes to overnight urination.

## 2022-09-14 NOTE — Progress Notes (Signed)
   09/14/2022 3:46 PM   Serena Croissant December 06, 1934 [id number]  Reason for visit: Follow up metastatic prostate cancer, nocturia  HPI: 88 year old male with history of stroke and metastatic prostate cancer managed by oncology who originally presented with urinary retention and elevated PVRs.  With his metastatic prostate cancer he opted for a HOLEP in July 2021 to get out of retention, and systemic treatment for his widely metastatic disease with significant bony mets.  He is followed by oncology and remains on ADT, abiraterone, and denosumab.  PSA remains undetectable, most recently 08/31/2022.  Notably, he had significant nocturia prior to undergoing any surgery or treatment for prostate cancer, with nocturia 4-5 times per night.    He has been urinating with a strong stream and emptying his bladder well with low PVRs since that time, but continues to have some frequency during the day every 3 hours, and nocturia 3-6 times overnight.  He was having nocturia 4-5 times overnight prior to undergoing HOLEP.  He is not having any incontinence.  He has had a cystoscopy which was normal and showed a wide open prostatic fossa and intact sphincter with no bladder lesions.  Recent urinalysis in June 2023 was completely benign.  He is compliant with CPAP for sleep apnea.  He continues to drink fluids in the afternoon and even overnight when he wakes up to pee.   We have tried numerous strategies for his overactive symptoms and nocturia including bladder diaries, anticholinergic OAB medications, both beta 3 agonist Myrbetriq and vibegron, desmopressin overnight, and most recently PTNS.   He also tried a condom catheter overnight but this kept falling off.  He has really had no significant improvement with any of these strategies.   He also previously asked about Botox in the bladder, we discussed the risk of retention, and he opted to defer for the time being.  He remains very hesitant about possible side effects  from Botox.  I think it would be reasonable to obtain urodynamics prior to pursuing more aggressive treatment with Botox.   Despite our multiple conversations previously about behavioral strategies and bladder irritants, he continues to drink fluids in the evening, including tea in the afternoon, diet soda, and coffee in the morning and afternoon, as well as fluids overnight.   Again, with his metastatic prostate cancer and history of stroke, as well as long history of nocturia 4-5 times overnight prior to prostate cancer diagnosis or any treatments, I think we need to have realistic expectations regarding the OAB component of his urinary symptoms.  He is now no longer catheter dependent and emptying well.  Reassurance was again provided regarding his urination every 3 hours during the day, which really is very normal and reasonable especially for someone with his comorbidities.  RTC 1 year PVR  I spent 35 total minutes on the day of the encounter including pre-visit review of the medical record, face-to-face time with the patient, and post visit ordering of labs/imaging/tests.   [place], [facility] [address], [place], [place] [address] [phone]

## 2022-10-09 ENCOUNTER — Encounter (INDEPENDENT_AMBULATORY_CARE_PROVIDER_SITE_OTHER): Payer: Self-pay

## 2022-10-25 ENCOUNTER — Other Ambulatory Visit: Payer: Self-pay | Admitting: Pharmacist

## 2022-10-25 DIAGNOSIS — C61 Malignant neoplasm of prostate: Secondary | ICD-10-CM

## 2022-11-06 ENCOUNTER — Encounter: Payer: Medicare Other | Admitting: Dermatology

## 2022-11-08 ENCOUNTER — Encounter: Payer: Medicare Other | Admitting: Dermatology

## 2022-11-09 ENCOUNTER — Encounter: Payer: Self-pay | Admitting: Dermatology

## 2022-11-09 ENCOUNTER — Ambulatory Visit (INDEPENDENT_AMBULATORY_CARE_PROVIDER_SITE_OTHER): Payer: Medicare Other | Admitting: Dermatology

## 2022-11-09 DIAGNOSIS — D492 Neoplasm of unspecified behavior of bone, soft tissue, and skin: Secondary | ICD-10-CM

## 2022-11-09 DIAGNOSIS — L309 Dermatitis, unspecified: Secondary | ICD-10-CM

## 2022-11-09 DIAGNOSIS — Z85828 Personal history of other malignant neoplasm of skin: Secondary | ICD-10-CM

## 2022-11-09 DIAGNOSIS — L578 Other skin changes due to chronic exposure to nonionizing radiation: Secondary | ICD-10-CM

## 2022-11-09 DIAGNOSIS — C4491 Basal cell carcinoma of skin, unspecified: Secondary | ICD-10-CM

## 2022-11-09 DIAGNOSIS — C44619 Basal cell carcinoma of skin of left upper limb, including shoulder: Secondary | ICD-10-CM | POA: Diagnosis not present

## 2022-11-09 DIAGNOSIS — L820 Inflamed seborrheic keratosis: Secondary | ICD-10-CM | POA: Diagnosis not present

## 2022-11-09 DIAGNOSIS — Z1283 Encounter for screening for malignant neoplasm of skin: Secondary | ICD-10-CM | POA: Diagnosis not present

## 2022-11-09 DIAGNOSIS — L821 Other seborrheic keratosis: Secondary | ICD-10-CM

## 2022-11-09 DIAGNOSIS — L814 Other melanin hyperpigmentation: Secondary | ICD-10-CM

## 2022-11-09 HISTORY — DX: Basal cell carcinoma of skin, unspecified: C44.91

## 2022-11-09 NOTE — Patient Instructions (Addendum)
Start triamcinolone 0.1% cream twice daily to hands for up to 2 weeks and then weekends only as needed. Avoid applying to face, groin, and axilla. Use as directed. Long-term use can cause thinning of the skin. At bedtime can follow with Vaseline and put on cotton gloves.   Topical steroids (such as triamcinolone, fluocinolone, fluocinonide, mometasone, clobetasol, halobetasol, betamethasone, hydrocortisone) can cause thinning and lightening of the skin if they are used for too long in the same area. Your physician has selected the right strength medicine for your problem and area affected on the body. Please use your medication only as directed by your physician to prevent side effects.   Gentle Skin Care Guide  1. Bathe no more than once a day.  2. Avoid bathing in hot water  3. Use a mild soap like Dove, Vanicream, Cetaphil, CeraVe. Can use Lever 2000 or Cetaphil antibacterial soap  4. Use soap only where you need it. On most days, use it under your arms, between your legs, and on your feet. Let the water rinse other areas unless visibly dirty.  5. When you get out of the bath/shower, use a towel to gently blot your skin dry, don't rub it.  6. While your skin is still a little damp, apply a moisturizing cream such as Vanicream, CeraVe, Cetaphil, Eucerin, Sarna lotion or plain Vaseline Jelly. For hands apply Neutrogena [place] Hand Cream or Excipial Hand Cream.  7. Reapply moisturizer any time you start to itch or feel dry.  8. Sometimes using free and clear laundry detergents can be helpful. Fabric softener sheets should be avoided. Downy Free & Gentle liquid, or any liquid fabric softener that is free of dyes and perfumes, it acceptable to use  9. If your doctor has given you prescription creams you may apply moisturizers over them    Wound Care Instructions  Cleanse wound gently with soap and water once a day then pat dry with clean gauze. Apply a thin coat of Petrolatum (petroleum  jelly, "Vaseline") over the wound (unless you have an allergy to this). We recommend that you use a new, sterile tube of Vaseline. Do not pick or remove scabs. Do not remove the yellow or white "healing tissue" from the base of the wound.  Cover the wound with fresh, clean, nonstick gauze and secure with paper tape. You may use Band-Aids in place of gauze and tape if the wound is small enough, but would recommend trimming much of the tape off as there is often too much. Sometimes Band-Aids can irritate the skin.  You should call the office for your biopsy report after 1 week if you have not already been contacted.  If you experience any problems, such as abnormal amounts of bleeding, swelling, significant bruising, significant pain, or evidence of infection, please call the office immediately.  Melanoma ABCDEs  Melanoma is the most dangerous type of skin cancer, and is the leading cause of death from skin disease.  You are more likely to develop melanoma if you: Have light-colored skin, light-colored eyes, or red or blond hair Spend a lot of time in the sun Tan regularly, either outdoors or in a tanning bed Have had blistering sunburns, especially during childhood Have a close family member who has had a melanoma Have atypical moles or large birthmarks  Early detection of melanoma is key since treatment is typically straightforward and cure rates are extremely high if we catch it early.   The first sign of melanoma is often a  change in a mole or a new dark spot.  The ABCDE system is a way of remembering the signs of melanoma.  A for asymmetry:  The two halves do not match. B for border:  The edges of the growth are irregular. C for color:  A mixture of colors are present instead of an even brown color. D for diameter:  Melanomas are usually (but not always) greater than 66m - the size of a pencil eraser. E for evolution:  The spot keeps changing in size, shape, and color.  Please check  your skin once per month between visits. You can use a small mirror in front and a large mirror behind you to keep an eye on the back side or your body.   If you see any new or changing lesions before your next follow-up, please call to schedule a visit.  Please continue daily skin protection including broad spectrum sunscreen SPF 30+ to sun-exposed areas, reapplying every 2 hours as needed when you're outdoors.    Due to recent changes in healthcare laws, you may see results of your pathology and/or laboratory studies on MyChart before the doctors have had a chance to review them. We understand that in some cases there may be results that are confusing or concerning to you. Please understand that not all results are received at the same time and often the doctors may need to interpret multiple results in order to provide you with the best plan of care or course of treatment. Therefore, we ask that you please give u[place]2 business days to thoroughly review all your results before contacting the office for clarification. Should we see a critical lab result, you will be contacted sooner.   If You Need Anything After Your Visit  If you have any questions or concerns for your doctor, please call our main line at [phone] press option 4 to reach your doctor's medical assistant. If no one answers, please leave a voicemail as directed and we will return your call as soon as possible. Messages left after 4 pm will be answered the following business day.   You may also send [place] message via [place] We typically respond to MyChart messages within 1-2 business days.  For prescription refills, please ask your pharmacy to contact our office. Our fax number is [phone]  If you have an urgent issue when the clinic is closed that cannot wait until the next business day, you can page your doctor at the number below.    Please note that while we do our best to be available for urgent issues outside of  office hours, we are not available 24/7.   If you have an urgent issue and are unable to reach u[place] you may choose to seek medical care at your doctor's office, retail clinic, urgent care center, or emergency room.  If you have a medical emergency, please immediately call 911 or go to the emergency department.  Pager Numbers  - Dr. KNehemiah Massed [phone] - Dr. MLaurence Ferrari [phone] - Dr. SNicole Kindred [phone] In the event of inclement weather, please call our main line at [phone] an update on the status of any delays or closures.  Dermatology Medication Tips: Please keep the boxes that topical medications come in in order to help keep track of the instructions about where and how to use these. Pharmacies typically print the medication instructions only on the boxes and not directly on the medication tubes.   If your  medication is too expensive, please contact our office at [phone] option 4 or send [place] a message through [place].   We are unable to tell what your co-pay for medications will be in advance as this is different depending on your insurance coverage. However, we may be able to find a substitute medication at lower cost or fill out paperwork to get insurance to cover a needed medication.   If a prior authorization is required to get your medication covered by your insurance company, please allow [place] 1-2 business days to complete this process.  Drug prices often vary depending on where the prescription is filled and some pharmacies may offer cheaper prices.  The website www.goodrx.com contains coupons for medications through different pharmacies. The prices here do not account for what the cost may be with help from insurance (it may be cheaper with your insurance), but the website can give you the price if you did not use any insurance.  - You can print the associated coupon and take it with your prescription to the pharmacy.  - You may also stop by our office during  regular business hours and pick up a GoodRx coupon card.  - If you need your prescription sent electronically to a different pharmacy, notify our office through [facility] or by phone at [phone] option 4.     Si Usted Necesita Algo Despus de Su Visita  Tambin puede enviarnos un mensaje a travs de Pharmacist, community. Por lo general respondemos a los mensajes de MyChart en el transcurso de 1 a 2 das hbiles.  Para renovar recetas, por favor pida a su farmacia que se ponga en contacto con nuestra oficina. Harland Dingwall de fax es [place] [phone].  Si tiene un asunto urgente cuando la clnica est cerrada y que no puede esperar hasta el siguiente da hbil, puede llamar/localizar a su doctor(a) al nmero que aparece a continuacin.   Por favor, tenga en cuenta que aunque hacemos todo lo posible para estar disponibles para asuntos urgentes fuera del horario de [place], no estamos disponibles las 24 horas del da, los 7 das de la [place].   Si tiene un problema urgente y no puede comunicarse con nosotros, puede optar por buscar atencin mdica  en el consultorio de su doctor(a), en una clnica privada, en un centro de atencin urgente o en una sala de emergencias.  Si tiene Engineering geologist, por favor llame inmediatamente al 911 o vaya a la sala de emergencias.  Nmeros de bper  - Dr. Nehemiah Massed: [phone]  - Dra. Moye: [phone]  - Dra. Nicole Kindred: [phone]  En caso de inclemencias del [place], por favor llame a Johnsie Kindred principal al [phone] para una actualizacin sobre el [place] de cualquier retraso o cierre.  Consejos para la medicacin en dermatologa: Por favor, guarde las cajas en las que vienen los medicamentos de uso tpico para ayudarle a seguir las instrucciones sobre dnde y cmo usarlos. Las farmacias generalmente imprimen las instrucciones del medicamento slo en las cajas y no directamente en los tubos del [place].   Si su medicamento es muy  caro, por favor, pngase en contacto con Zigmund Daniel llamando al [phone] y presione la opcin 4 o envenos un mensaje a travs de Pharmacist, community.   No podemos decirle cul ser su copago por los medicamentos por adelantado ya que esto es diferente dependiendo de la cobertura de su seguro. Sin embargo, es posible que podamos encontrar un medicamento sustituto a Electrical engineer un formulario para  que el seguro cubra el medicamento que se considera necesario.   Si se requiere una autorizacin previa para que su compaa de seguros [place] su medicamento, por favor permtanos de 1 a 2 das hbiles para completar este proceso.  Los precios de los medicamentos varan con frecuencia dependiendo del Environmental consultant de dnde se surte la receta y alguna farmacias pueden ofrecer precios ms baratos.  El sitio web www.goodrx.com tiene cupones para medicamentos de Airline pilot. Los precios aqu no tienen en cuenta lo que podra costar con la ayuda del seguro (puede ser ms barato con su seguro), pero el sitio web puede darle el precio si no utiliz Research scientist (physical sciences).  - Puede imprimir el cupn correspondiente y llevarlo con su receta a la farmacia.  - Tambin puede pasar por nuestra oficina durante el horario de atencin regular y Charity fundraiser una tarjeta de cupones de GoodRx.  - Si necesita que su receta se enve electrnicamente a una farmacia diferente, informe a nuestra oficina a travs de MyChart de [place] o por telfono llamando al [phone] y presione la opcin 4.

## 2022-11-09 NOTE — Progress Notes (Signed)
Follow-Up Visit   Subjective  Andrew Callahan is a 88 y.o. male who presents for the following: FBSE (Hx SCC).  The patient presents for Total-Body Skin Exam (TBSE) for skin cancer screening and mole check.  The patient has spots, moles and lesions to be evaluated, some may be new or changing and the patient has concerns that these could be cancer.  He is bothered by irritated spots on his chest and face.  He is also bothered by rough, irritated spots at his hands. He notes frequent hand washing.   Patient accompanied by wife who contributes to history.  The following portions of the chart were reviewed this encounter and updated as appropriate:   Tobacco  Allergies  Meds  Problems  Med Hx  Surg Hx  Fam Hx      Review of Systems:  No other skin or systemic complaints except as noted in HPI or Assessment and Plan.  Objective  Well appearing patient in no apparent distress; mood and affect are within normal limits.  A full examination was performed including scalp, head, eyes, ears, nose, lips, neck, chest, axillae, abdomen, back, buttocks, bilateral upper extremities, bilateral lower extremities, hands, feet, fingers, toes, fingernails, and toenails. All findings within normal limits unless otherwise noted below.  b/l hands Scaly pink plaques and xerosis  left posterior upper arm 1.0 cm scaly pink plaque     right clavicle x 1, right preauricular x 1 (2) Erythematous stuck-on, waxy papule or plaque    Assessment & Plan  Hand dermatitis b/l hands  Chronic and persistent condition with duration over one year. Condition is symptomatic/ bothersome to patient. Not currently at goal.  Start TMC 0.1% cream twice daily to hands for up to 2 weeks and then weekends only as needed. Avoid applying to face, groin, and axilla. Use as directed. Long-term use can cause thinning of the skin. At bedtime can follow with Vaseline and put on cotton gloves.  Patient has at home.    Topical steroids (such as triamcinolone, fluocinolone, fluocinonide, mometasone, clobetasol, halobetasol, betamethasone, hydrocortisone) can cause thinning and lightening of the skin if they are used for too long in the same area. Your physician has selected the right strength medicine for your problem and area affected on the body. Please use your medication only as directed by your physician to prevent side effects.   Reviewed gentle skin care and provided handout  Neoplasm of skin left posterior upper arm  Skin / nail biopsy Type of biopsy: tangential   Informed consent: discussed and consent obtained   Timeout: patient name, date of birth, surgical site, and procedure verified   Procedure prep:  Patient was prepped and draped in usual sterile fashion Prep type:  Isopropyl alcohol Anesthesia: the lesion was anesthetized in a standard fashion   Anesthetic:  1% lidocaine w/ epinephrine 1-100,000 buffered w/ 8.4% NaHCO3 Instrument used: flexible razor blade   Hemostasis achieved with: aluminum chloride   Outcome: patient tolerated procedure well   Post-procedure details: wound care instructions given   Additional details:  Petrolatum and a pressure bandage applied  Specimen 1 - Surgical pathology Differential Diagnosis: r/o BCC  Check Margins: No 1.0 cm scaly pink plaque  Inflamed seborrheic keratosis (2) right clavicle x 1, right preauricular x 1  Symptomatic, irritating, patient would like treated. Benign-appearing.  Observation.  Call clinic for new or changing lesions.   Prior to procedure, discussed risks of blister formation, small wound, skin dyspigmentation, or rare scar following  cryotherapy. Recommend Vaseline ointment to treated areas while healing.   Destruction of lesion - right clavicle x 1, right preauricular x 1  Destruction method: cryotherapy   Informed consent: discussed and consent obtained   Lesion destroyed using liquid nitrogen: Yes   Cryotherapy  cycles:  2 Outcome: patient tolerated procedure well with no complications   Post-procedure details: wound care instructions given     Lentigines - Scattered tan macules - Due to sun exposure - Benign-appearing, observe - Recommend daily broad spectrum sunscreen SPF 30+ to sun-exposed areas, reapply every 2 hours as needed. - Call for any changes  Seborrheic Keratoses - Stuck-on, waxy, tan-brown papules and/or plaques  - Benign-appearing - Discussed benign etiology and prognosis. - Observe - Call for any changes  Melanocytic Nevi - Tan-brown and/or pink-flesh-colored symmetric macules and papules - Benign appearing on exam today - Observation - Call clinic for new or changing moles - Recommend daily use of broad spectrum spf 30+ sunscreen to sun-exposed areas.   Hemangiomas - Red papules - Discussed benign nature - Observe - Call for any changes  Actinic Damage - Chronic condition, secondary to cumulative UV/sun exposure - diffuse scaly erythematous macules with underlying dyspigmentation - Recommend daily broad spectrum sunscreen SPF 30+ to sun-exposed areas, reapply every 2 hours as needed.  - Staying in the shade or wearing long sleeves, sun glasses (UVA+UVB protection) and wide brim hats (4-inch brim around the entire circumference of the hat) are also recommended for sun protection.  - Call for new or changing lesions.  Skin cancer screening performed today.  History of Squamous Cell Carcinoma of the Skin - No evidence of recurrence today at right hand - No lymphadenopathy - Recommend regular full body skin exams - Recommend daily broad spectrum sunscreen SPF 30+ to sun-exposed areas, reapply every 2 hours as needed.  - Call if any new or changing lesions are noted between office visits   Return in about 6 months (around 05/10/2023) for TBSE.  Graciella Belton, RMA, am acting as scribe for Forest Gleason, MD .  Documentation: I have reviewed the above  documentation for accuracy and completeness, and I agree with the above.  Forest Gleason, MD

## 2022-11-13 ENCOUNTER — Telehealth: Payer: Self-pay

## 2022-11-13 NOTE — Telephone Encounter (Signed)
Oral Oncology Patient Advocate Encounter   Received notification that prior authorization for Abiraterone is due for renewal.   PA submitted on 12.02.23  Key [id number]  Status is pending     Berdine Addison, [place] Patient [place]  [phone] (phone) [phone] (fax) 11/13/2022 10:40 AM

## 2022-11-14 NOTE — Telephone Encounter (Signed)
Oral Oncology Patient Advocate Encounter  Prior Authorization for Abiraterone has been renewed.    Effective dates: 10/14/22 through 11/13/23   Berdine Addison, [place] Patient [place]  [phone] (phone) [phone] (fax) 11/14/2022 10:38 AM

## 2022-11-20 ENCOUNTER — Telehealth: Payer: Self-pay

## 2022-11-20 NOTE — Telephone Encounter (Signed)
Patient called requesting biopsy results, discussed with patient we have not received his results we should call him in the next few days with biopsy results

## 2022-11-21 ENCOUNTER — Telehealth: Payer: Self-pay

## 2022-11-21 NOTE — Telephone Encounter (Signed)
Just signed off. It came back Friday but went to the wrong Inbox so I did not see it yesterday. Thank you!

## 2022-11-21 NOTE — Telephone Encounter (Signed)
Patient's wife advised pathology showed [place] at left upper arm, scheduled for [facility]. Lurlean Horns., RMA

## 2022-11-30 ENCOUNTER — Encounter: Payer: Self-pay | Admitting: Pharmacist

## 2022-11-30 ENCOUNTER — Inpatient Hospital Stay: Payer: Medicare Other

## 2022-11-30 ENCOUNTER — Inpatient Hospital Stay: Payer: Medicare Other | Admitting: Pharmacist

## 2022-11-30 ENCOUNTER — Inpatient Hospital Stay: Payer: Medicare Other | Attending: Oncology

## 2022-11-30 VITALS — BP 133/86 | HR 58

## 2022-11-30 DIAGNOSIS — D509 Iron deficiency anemia, unspecified: Secondary | ICD-10-CM

## 2022-11-30 DIAGNOSIS — C61 Malignant neoplasm of prostate: Secondary | ICD-10-CM

## 2022-11-30 DIAGNOSIS — C7951 Secondary malignant neoplasm of bone: Secondary | ICD-10-CM | POA: Diagnosis present

## 2022-11-30 DIAGNOSIS — Z5111 Encounter for antineoplastic chemotherapy: Secondary | ICD-10-CM | POA: Diagnosis present

## 2022-11-30 DIAGNOSIS — R200 Anesthesia of skin: Secondary | ICD-10-CM

## 2022-11-30 LAB — PSA: Prostatic Specific Antigen: <0.01 ng/mL (ref 0.00–4.00)

## 2022-11-30 LAB — CBC WITH DIFFERENTIAL/PLATELET
Abs Immature Granulocytes: 0.07 10*3/uL (ref 0.00–0.07)
Basophils Absolute: 0 10*3/uL (ref 0.0–0.1)
Basophils Relative: 0 %
Eosinophils Absolute: 0.1 10*3/uL (ref 0.0–0.5)
Eosinophils Relative: 1 %
HCT: 38.6 % — ABNORMAL LOW (ref 39.0–52.0)
Hemoglobin: 13.2 g/dL (ref 13.0–17.0)
Immature Granulocytes: 1 %
Lymphocytes Relative: 20 %
Lymphs Abs: 1.9 10*3/uL (ref 0.7–4.0)
MCH: 30 pg (ref 26.0–34.0)
MCHC: 34.2 g/dL (ref 30.0–36.0)
MCV: 87.7 fL (ref 80.0–100.0)
Monocytes Absolute: 0.6 10*3/uL (ref 0.1–1.0)
Monocytes Relative: 6 %
Neutro Abs: 6.9 10*3/uL (ref 1.7–7.7)
Neutrophils Relative %: 72 %
Platelets: 198 10*3/uL (ref 150–400)
RBC: 4.4 MIL/uL (ref 4.22–5.81)
RDW: 14.1 % (ref 11.5–15.5)
WBC: 9.6 10*3/uL (ref 4.0–10.5)
nRBC: 0 % (ref 0.0–0.2)

## 2022-11-30 LAB — COMPREHENSIVE METABOLIC PANEL
ALT: 16 U/L (ref 0–44)
AST: 25 U/L (ref 15–41)
Albumin: 3.9 g/dL (ref 3.5–5.0)
Alkaline Phosphatase: 49 U/L (ref 38–126)
Anion gap: 8 (ref 5–15)
BUN: 19 mg/dL (ref 8–23)
CO2: 22 mmol/L (ref 22–32)
Calcium: 8.7 mg/dL — ABNORMAL LOW (ref 8.9–10.3)
Chloride: 106 mmol/L (ref 98–111)
Creatinine, Ser: 0.83 mg/dL (ref 0.61–1.24)
GFR, Estimated: >60 mL/min (ref >60)
Glucose, Bld: 231 mg/dL — ABNORMAL HIGH (ref 70–99)
Potassium: 4 mmol/L (ref 3.5–5.1)
Sodium: 136 mmol/L (ref 135–145)
Total Bilirubin: 0.6 mg/dL (ref 0.3–1.2)
Total Protein: 6.8 g/dL (ref 6.5–8.1)

## 2022-11-30 LAB — FERRITIN: Ferritin: 21 ng/mL — ABNORMAL LOW (ref 24–336)

## 2022-11-30 MED ORDER — DENOSUMAB 120 MG/1.7ML ~~LOC~~ SOLN
120.0000 mg | Freq: Once | SUBCUTANEOUS | Status: AC
  Administered 2022-11-30: 120 mg via SUBCUTANEOUS
  Filled 2022-11-30: qty 1.7

## 2022-11-30 MED ORDER — ABIRATERONE ACETATE 250 MG PO TABS: ORAL_TABLET | ORAL | 2 refills | Status: DC

## 2022-11-30 MED ORDER — LEUPROLIDE ACETATE (6 MONTH) 45 MG ~~LOC~~ KIT
45.0000 mg | PACK | Freq: Once | SUBCUTANEOUS | Status: AC
  Administered 2022-11-30: 45 mg via SUBCUTANEOUS
  Filled 2022-11-30: qty 45

## 2022-11-30 NOTE — Progress Notes (Signed)
[place]  Telephone:([phone] [phone]  Patient Care Team: Rusty Aus, MD as PCP - General (Internal Medicine) Lloyd Huger, MD as Consulting Physician (Oncology)   Name of the patient: Andrew Callahan  [id number]  January 08, 1934   Date of visit: 11/30/22  HPI: Patient is a 88 y.o. male with metastatic prostate cancer. Currently on treatment with Zytiga (abiraterone), ADT, and Xgeva.  Reason for Consult: Abiraterone follow-up    PAST MEDICAL HISTORY: Past Medical History:  Diagnosis Date   Acquired hypothyroidism    Aortic atherosclerosis ([place])    Atrial fibrillation ([place])    BCC (basal cell carcinoma) 11/09/2022   left posterior upper arm, EDC scheduled   Chronic constipation    CVA (cerebral vascular accident) ([place])    DDD (degenerative disc disease), cervical    Diabetes mellitus without complication ([place])    Diverticulosis    Elevated prostate specific antigen (PSA)    Family history of leukemia    Family history of lung cancer    History of kidney stones    History of pituitary adenoma    Hyperlipidemia    Hypertension    Hypogonadism in male    Hypothyroid    Hypothyroidism    IDA (iron deficiency anemia)    Lumbar disc disease    Nephrolithiasis    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 Mohs.   Stroke ([place])    throat / slight difficulty swallowing    HEMATOLOGY/ONCOLOGY HISTORY:  Oncology History  Prostate cancer ([place])  02/15/2020 Initial Diagnosis   Prostate cancer ([place])   02/17/2020 Cancer Staging   Staging form: Prostate, AJCC 8th Edition - Clinical stage from 02/17/2020: Stage IVB (cT2c, cN0, cM1b, PSA: 8.6, Grade Group: 5) - Signed by Lloyd Huger, MD on 02/17/2020    Genetic Testing    Negative genetic testing. No pathogenic variants identified on the [facility] Common Hereditary Cancers Panel. VUS in [facility] identified. The report date is 04/07/2020.  The Common Hereditary Cancers Panel offered by [facility] includes sequencing and/or deletion duplication testing of the following 48 genes: APC, ATM, AXIN2, BARD1, BMPR1A, BRCA1, BRCA2, BRIP1, CDH1, CDKN2A (p14ARF), CDKN2A (p16INK4a), CKD4, CHEK2, CTNNA1, DICER1, EPCAM (Deletion/duplication testing only), GREM1 (promoter region deletion/duplication testing only), KIT, MEN1, MLH1, MSH2, MSH3, MSH6, MUTYH, NBN, NF1, NHTL1, PALB2, PDGFRA, PMS2, POLD1, POLE, PTEN, RAD50, RAD51C, RAD51D, RNF43, SDHB, SDHC, SDHD, SMAD4, SMARCA4. STK11, TP53, TSC1, TSC2, and VHL.  The following genes were evaluated for sequence changes only: SDHA and HOXB13 c.251G>A variant only.     ALLERGIES:  is allergic to metoprolol and rivaroxaban.  MEDICATIONS:  Current Outpatient Medications  Medication Sig Dispense Refill   abiraterone acetate (ZYTIGA) 250 MG tablet TAKE 2 TABLETS (500MG) BY MOUTH ONE TIME DAILY ON AN EMPTY STOMACH 1 HOUR BEFORE OR 2 HOURS AFTER A MEAL 60 tablet 2   ACCU-CHEK GUIDE test strip daily. use as directed     Accu-Chek Softclix Lancets lancets daily.     B Complex-C (SUPER B COMPLEX PO) Take 1 capsule by mouth daily.      Blood Glucose Monitoring Suppl (ACCU-CHEK GUIDE) w/Device KIT See admin instructions.     Calcium-Magnesium-Vitamin D (CALCIUM 1200+D3 PO) Take 2 tablets by mouth in the morning and  at bedtime.     cetirizine (ZYRTEC) 10 MG tablet Take 10 mg by mouth daily as needed for allergies.     denosumab (XGEVA) 120 MG/1.7ML SOLN injection Inject 120 mg into the skin every 3 (three) months.     diltiazem (CARDIZEM) 30 MG tablet Take 30 mg by mouth daily as needed.     ELIQUIS 5 MG TABS tablet Take 5 mg by mouth 2 (two) times daily.     hydrocortisone (CORTEF) 10 MG tablet Take 10 mg by mouth 2 (two) times daily.      Iron, Ferrous  Sulfate, 325 (65 Fe) MG TABS daily.     Leuprolide Acetate (ELIGARD [place]) Inject into the skin. 1 injection every 6 months.     levothyroxine (SYNTHROID) 50 MCG tablet Take by mouth.     Misc Natural Products (OSTEO BI-FLEX ADV JOINT SHIELD PO) Take 1 tablet by mouth daily.      Multiple Vitamin (MULTIVITAMIN WITH MINERALS) TABS tablet Take 1 tablet by mouth daily. One-A-Day Men's 50+     polyethylene glycol (MIRALAX / GLYCOLAX) 17 g packet Take 17 g by mouth daily. W/coffee     pregabalin (LYRICA) 25 MG capsule Take 25 mg by mouth 2 (two) times daily.     Probiotic Product (ALIGN) 4 MG CAPS Take 4 mg by mouth daily.      valsartan (DIOVAN) 160 MG tablet Take 160 mg by mouth 2 (two) times daily.     No current facility-administered medications for this visit.   Facility-Administered Medications Ordered in Other Visits  Medication Dose Route Frequency Provider Last Rate Last Admin   denosumab (XGEVA) injection 120 mg  120 mg Subcutaneous Once Lloyd Huger, MD       leuprolide (6 Month) (ELIGARD) injection 45 mg  45 mg Subcutaneous Once Lloyd Huger, MD        VITAL SIGNS: BP 133/86   Pulse (!) 58   SpO2 98%  There were no vitals filed for this visit.   Estimated body mass index is 34.09 kg/m as calculated from the following:   Height as of 05/30/22: 5' 10.5" (1.791 m).   Weight as of 08/31/22: 109.3 kg (241 lb).  LABS: CBC:    Component Value Date/Time   WBC 9.6 11/30/2022 1259   HGB 13.2 11/30/2022 1259   HCT 38.6 (L) 11/30/2022 1259   PLT 198 11/30/2022 1259   MCV 87.7 11/30/2022 1259   NEUTROABS 6.9 11/30/2022 1259   LYMPHSABS 1.9 11/30/2022 1259   MONOABS 0.6 11/30/2022 1259   EOSABS 0.1 11/30/2022 1259   BASOSABS 0.0 11/30/2022 1259   Comprehensive Metabolic Panel:    Component Value Date/Time   NA 136 11/30/2022 1259   NA 145 (H) 04/14/2021 1402   K 4.0 11/30/2022 1259   CL 106 11/30/2022 1259   CO2 22 11/30/2022 1259   BUN 19 11/30/2022 1259   BUN  12 04/14/2021 1402   CREATININE 0.83 11/30/2022 1259   GLUCOSE 231 (H) 11/30/2022 1259   CALCIUM 8.7 (L) 11/30/2022 1259   AST 25 11/30/2022 1259   ALT 16 11/30/2022 1259   ALKPHOS 49 11/30/2022 1259   BILITOT 0.6 11/30/2022 1259   PROT 6.8 11/30/2022 1259   ALBUMIN 3.9 11/30/2022 1259     Present during today's visit: patient and his wife  Assessment and Plan: CMP/CBC reviewed, continue abiraterone 520m daily PSA pending, patient knows we will contact him if there is anything concerning, they also check his  labs on mychart  Proceed with Xgeva (every 3 months) and Eligard (every 6 months) today  **Of note, patient is not prescribed prednisone with his abiraterone because he takes hydrocortisone daily   Oral Chemotherapy Side Effect/Intolerance:  No reported hot flashes, fatigue, or edema  Oral Chemotherapy Adherence: Only one missed dose dose he was last seen reported  No patient barriers to medication adherence identified.   New medications: No new medications  Medication Access Issues: no issues reported  Patient expressed understanding and was in agreement with this plan. He also understands that He can call clinic at any time with any questions, concerns, or complaints.   Follow-up plan: RTC in 3 months for labs/MD/Xgeva  Thank you for allowing me to participate in the care of this very pleasant patient.   Time Total: 15 mins  Visit consisted of counseling and education on dealing with issues of symptom management in the setting of serious and potentially life-threatening illness.Greater than 50%  of this time was spent counseling and coordinating care related to the above assessment and plan.  Signed by: Darl Pikes, PharmD, BCPS, Salley Slaughter, CPP Hematology/Oncology Clinical Pharmacist Practitioner [place]/DB/AP [facility] [phone]  11/30/2022 2:06 PM

## 2022-12-08 ENCOUNTER — Emergency Department: Payer: Medicare Other

## 2022-12-08 ENCOUNTER — Inpatient Hospital Stay
Admission: EM | Admit: 2022-12-08 | Discharge: 2022-12-11 | DRG: 948 | Disposition: A | Discharge status: Skilled Nursing Facility | Payer: Medicare Other | Attending: Internal Medicine | Admitting: Internal Medicine

## 2022-12-08 ENCOUNTER — Other Ambulatory Visit: Payer: Self-pay

## 2022-12-08 DIAGNOSIS — M545 Low back pain, unspecified: Secondary | ICD-10-CM | POA: Diagnosis present

## 2022-12-08 DIAGNOSIS — E785 Hyperlipidemia, unspecified: Secondary | ICD-10-CM | POA: Diagnosis present

## 2022-12-08 DIAGNOSIS — N3281 Overactive bladder: Secondary | ICD-10-CM | POA: Diagnosis present

## 2022-12-08 DIAGNOSIS — E039 Hypothyroidism, unspecified: Secondary | ICD-10-CM

## 2022-12-08 DIAGNOSIS — R531 Weakness: Secondary | ICD-10-CM | POA: Diagnosis not present

## 2022-12-08 DIAGNOSIS — Z9989 Dependence on other enabling machines and devices: Secondary | ICD-10-CM

## 2022-12-08 DIAGNOSIS — Z79899 Other long term (current) drug therapy: Secondary | ICD-10-CM

## 2022-12-08 DIAGNOSIS — D509 Iron deficiency anemia, unspecified: Secondary | ICD-10-CM | POA: Diagnosis present

## 2022-12-08 DIAGNOSIS — G8929 Other chronic pain: Secondary | ICD-10-CM | POA: Diagnosis present

## 2022-12-08 DIAGNOSIS — E1142 Type 2 diabetes mellitus with diabetic polyneuropathy: Secondary | ICD-10-CM | POA: Diagnosis present

## 2022-12-08 DIAGNOSIS — D61818 Other pancytopenia: Secondary | ICD-10-CM | POA: Diagnosis present

## 2022-12-08 DIAGNOSIS — R509 Fever, unspecified: Secondary | ICD-10-CM | POA: Diagnosis not present

## 2022-12-08 DIAGNOSIS — I480 Paroxysmal atrial fibrillation: Secondary | ICD-10-CM | POA: Diagnosis present

## 2022-12-08 DIAGNOSIS — Z8673 Personal history of transient ischemic attack (TIA), and cerebral infarction without residual deficits: Secondary | ICD-10-CM

## 2022-12-08 DIAGNOSIS — I10 Essential (primary) hypertension: Secondary | ICD-10-CM | POA: Diagnosis present

## 2022-12-08 DIAGNOSIS — R6510 Systemic inflammatory response syndrome (SIRS) of non-infectious origin without acute organ dysfunction: Secondary | ICD-10-CM | POA: Diagnosis present

## 2022-12-08 DIAGNOSIS — E8809 Other disorders of plasma-protein metabolism, not elsewhere classified: Secondary | ICD-10-CM | POA: Diagnosis present

## 2022-12-08 DIAGNOSIS — Z7901 Long term (current) use of anticoagulants: Secondary | ICD-10-CM

## 2022-12-08 DIAGNOSIS — Z806 Family history of leukemia: Secondary | ICD-10-CM

## 2022-12-08 DIAGNOSIS — K8020 Calculus of gallbladder without cholecystitis without obstruction: Secondary | ICD-10-CM | POA: Diagnosis present

## 2022-12-08 DIAGNOSIS — J9811 Atelectasis: Secondary | ICD-10-CM | POA: Diagnosis present

## 2022-12-08 DIAGNOSIS — Z981 Arthrodesis status: Secondary | ICD-10-CM

## 2022-12-08 DIAGNOSIS — Z85828 Personal history of other malignant neoplasm of skin: Secondary | ICD-10-CM

## 2022-12-08 DIAGNOSIS — E876 Hypokalemia: Secondary | ICD-10-CM | POA: Diagnosis present

## 2022-12-08 DIAGNOSIS — E669 Obesity, unspecified: Secondary | ICD-10-CM | POA: Diagnosis present

## 2022-12-08 DIAGNOSIS — Z823 Family history of stroke: Secondary | ICD-10-CM

## 2022-12-08 DIAGNOSIS — R410 Disorientation, unspecified: Principal | ICD-10-CM | POA: Diagnosis present

## 2022-12-08 DIAGNOSIS — I452 Bifascicular block: Secondary | ICD-10-CM | POA: Diagnosis present

## 2022-12-08 DIAGNOSIS — J441 Chronic obstructive pulmonary disease with (acute) exacerbation: Secondary | ICD-10-CM | POA: Diagnosis present

## 2022-12-08 DIAGNOSIS — G4733 Obstructive sleep apnea (adult) (pediatric): Secondary | ICD-10-CM | POA: Diagnosis present

## 2022-12-08 DIAGNOSIS — Z888 Allergy status to other drugs, medicaments and biological substances status: Secondary | ICD-10-CM

## 2022-12-08 DIAGNOSIS — K760 Fatty (change of) liver, not elsewhere classified: Secondary | ICD-10-CM | POA: Diagnosis present

## 2022-12-08 DIAGNOSIS — Z79890 Hormone replacement therapy (postmenopausal): Secondary | ICD-10-CM

## 2022-12-08 DIAGNOSIS — C61 Malignant neoplasm of prostate: Secondary | ICD-10-CM | POA: Diagnosis not present

## 2022-12-08 DIAGNOSIS — Z87442 Personal history of urinary calculi: Secondary | ICD-10-CM

## 2022-12-08 DIAGNOSIS — W19XXXA Unspecified fall, initial encounter: Secondary | ICD-10-CM

## 2022-12-08 DIAGNOSIS — Y92009 Unspecified place in unspecified non-institutional (private) residence as the place of occurrence of the external cause: Secondary | ICD-10-CM

## 2022-12-08 DIAGNOSIS — Z7952 Long term (current) use of systemic steroids: Secondary | ICD-10-CM

## 2022-12-08 DIAGNOSIS — Z87891 Personal history of nicotine dependence: Secondary | ICD-10-CM

## 2022-12-08 DIAGNOSIS — Z7983 Long term (current) use of bisphosphonates: Secondary | ICD-10-CM

## 2022-12-08 DIAGNOSIS — Z801 Family history of malignant neoplasm of trachea, bronchus and lung: Secondary | ICD-10-CM | POA: Diagnosis not present

## 2022-12-08 DIAGNOSIS — R200 Anesthesia of skin: Secondary | ICD-10-CM | POA: Diagnosis not present

## 2022-12-08 DIAGNOSIS — Z1152 Encounter for screening for COVID-19: Secondary | ICD-10-CM | POA: Diagnosis not present

## 2022-12-08 DIAGNOSIS — Z6835 Body mass index (BMI) 35.0-35.9, adult: Secondary | ICD-10-CM

## 2022-12-08 DIAGNOSIS — Z8249 Family history of ischemic heart disease and other diseases of the circulatory system: Secondary | ICD-10-CM

## 2022-12-08 DIAGNOSIS — K5909 Other constipation: Secondary | ICD-10-CM | POA: Diagnosis present

## 2022-12-08 DIAGNOSIS — Z66 Do not resuscitate: Secondary | ICD-10-CM | POA: Diagnosis present

## 2022-12-08 DIAGNOSIS — R202 Paresthesia of skin: Secondary | ICD-10-CM | POA: Diagnosis not present

## 2022-12-08 DIAGNOSIS — Z8546 Personal history of malignant neoplasm of prostate: Secondary | ICD-10-CM

## 2022-12-08 DIAGNOSIS — Z7984 Long term (current) use of oral hypoglycemic drugs: Secondary | ICD-10-CM

## 2022-12-08 LAB — BASIC METABOLIC PANEL
Anion gap: 9 (ref 5–15)
BUN: 18 mg/dL (ref 8–23)
CO2: 21 mmol/L — ABNORMAL LOW (ref 22–32)
Calcium: 8.5 mg/dL — ABNORMAL LOW (ref 8.9–10.3)
Chloride: 111 mmol/L (ref 98–111)
Creatinine, Ser: 0.88 mg/dL (ref 0.61–1.24)
GFR, Estimated: >60 mL/min (ref >60)
Glucose, Bld: 76 mg/dL (ref 70–99)
Potassium: 3.6 mmol/L (ref 3.5–5.1)
Sodium: 141 mmol/L (ref 135–145)

## 2022-12-08 LAB — COMPREHENSIVE METABOLIC PANEL
ALT: 19 U/L (ref 0–44)
AST: 41 U/L (ref 15–41)
Albumin: 3.6 g/dL (ref 3.5–5.0)
Alkaline Phosphatase: 53 U/L (ref 38–126)
Anion gap: 8 (ref 5–15)
BUN: 20 mg/dL (ref 8–23)
CO2: 23 mmol/L (ref 22–32)
Calcium: 8.7 mg/dL — ABNORMAL LOW (ref 8.9–10.3)
Chloride: 108 mmol/L (ref 98–111)
Creatinine, Ser: 0.86 mg/dL (ref 0.61–1.24)
GFR, Estimated: >60 mL/min (ref >60)
Glucose, Bld: 106 mg/dL — ABNORMAL HIGH (ref 70–99)
Potassium: 3.2 mmol/L — ABNORMAL LOW (ref 3.5–5.1)
Sodium: 139 mmol/L (ref 135–145)
Total Bilirubin: 0.8 mg/dL (ref 0.3–1.2)
Total Protein: 6.4 g/dL — ABNORMAL LOW (ref 6.5–8.1)

## 2022-12-08 LAB — CBC WITH DIFFERENTIAL/PLATELET
Abs Immature Granulocytes: 0.04 10*3/uL (ref 0.00–0.07)
Basophils Absolute: 0 10*3/uL (ref 0.0–0.1)
Basophils Relative: 0 %
Eosinophils Absolute: 0.1 10*3/uL (ref 0.0–0.5)
Eosinophils Relative: 1 %
HCT: 39.4 % (ref 39.0–52.0)
Hemoglobin: 13.1 g/dL (ref 13.0–17.0)
Immature Granulocytes: 1 %
Lymphocytes Relative: 19 %
Lymphs Abs: 1.4 10*3/uL (ref 0.7–4.0)
MCH: 29.5 pg (ref 26.0–34.0)
MCHC: 33.2 g/dL (ref 30.0–36.0)
MCV: 88.7 fL (ref 80.0–100.0)
Monocytes Absolute: 1.1 10*3/uL — ABNORMAL HIGH (ref 0.1–1.0)
Monocytes Relative: 15 %
Neutro Abs: 4.8 10*3/uL (ref 1.7–7.7)
Neutrophils Relative %: 64 %
Platelets: 145 10*3/uL — ABNORMAL LOW (ref 150–400)
RBC: 4.44 MIL/uL (ref 4.22–5.81)
RDW: 14.5 % (ref 11.5–15.5)
WBC: 7.4 10*3/uL (ref 4.0–10.5)
nRBC: 0 % (ref 0.0–0.2)

## 2022-12-08 LAB — TROPONIN I (HIGH SENSITIVITY): Troponin I (High Sensitivity): 56 ng/L — ABNORMAL HIGH (ref <18)

## 2022-12-08 LAB — URINALYSIS, ROUTINE W REFLEX MICROSCOPIC
Bilirubin Urine: NEGATIVE
Glucose, UA: NEGATIVE mg/dL
Hgb urine dipstick: NEGATIVE
Ketones, ur: NEGATIVE mg/dL
Leukocytes,Ua: NEGATIVE
Nitrite: NEGATIVE
Protein, ur: NEGATIVE mg/dL
Specific Gravity, Urine: 1.017 (ref 1.005–1.030)
pH: 6 (ref 5.0–8.0)

## 2022-12-08 LAB — CBC
HCT: 42.9 % (ref 39.0–52.0)
Hemoglobin: 13.6 g/dL (ref 13.0–17.0)
MCH: 29.6 pg (ref 26.0–34.0)
MCHC: 31.7 g/dL (ref 30.0–36.0)
MCV: 93.5 fL (ref 80.0–100.0)
Platelets: 113 10*3/uL — ABNORMAL LOW (ref 150–400)
RBC: 4.59 MIL/uL (ref 4.22–5.81)
RDW: 14.4 % (ref 11.5–15.5)
WBC: 8.6 10*3/uL (ref 4.0–10.5)
nRBC: 0 % (ref 0.0–0.2)

## 2022-12-08 LAB — RESP PANEL BY RT-PCR (RSV, FLU A&B, COVID)  RVPGX2
Influenza A by PCR: NEGATIVE
Influenza B by PCR: NEGATIVE
Resp Syncytial Virus by PCR: NEGATIVE
SARS Coronavirus 2 by RT PCR: NEGATIVE

## 2022-12-08 LAB — BLOOD GAS, VENOUS
Acid-Base Excess: 3 mmol/L — ABNORMAL HIGH (ref 0.0–2.0)
Bicarbonate: 25.5 mmol/L (ref 20.0–28.0)
O2 Saturation: 96.9 %
Patient temperature: 37
pCO2, Ven: 32 mmHg — ABNORMAL LOW (ref 44–60)
pH, Ven: 7.51 — ABNORMAL HIGH (ref 7.25–7.43)
pO2, Ven: 79 mmHg — ABNORMAL HIGH (ref 32–45)

## 2022-12-08 LAB — CBG MONITORING, ED: Glucose-Capillary: 95 mg/dL (ref 70–99)

## 2022-12-08 LAB — LACTIC ACID, PLASMA: Lactic Acid, Venous: 1.6 mmol/L (ref 0.5–1.9)

## 2022-12-08 MED ORDER — LEVOTHYROXINE SODIUM 50 MCG PO TABS
50.0000 ug | ORAL_TABLET | Freq: Every day | ORAL | Status: DC
  Administered 2022-12-09 – 2022-12-11 (×3): 50 ug via ORAL
  Filled 2022-12-08 (×4): qty 1

## 2022-12-08 MED ORDER — APIXABAN 5 MG PO TABS
5.0000 mg | ORAL_TABLET | Freq: Two times a day (BID) | ORAL | Status: DC
  Administered 2022-12-09 – 2022-12-11 (×5): 5 mg via ORAL
  Filled 2022-12-08 (×6): qty 1

## 2022-12-08 MED ORDER — TRAZODONE HCL 50 MG PO TABS
25.0000 mg | ORAL_TABLET | Freq: Every evening | ORAL | Status: DC | PRN
  Administered 2022-12-10: 25 mg via ORAL
  Filled 2022-12-08: qty 1

## 2022-12-08 MED ORDER — SODIUM CHLORIDE 0.9 % IV SOLN: 2.0000 g | Freq: Once | INTRAVENOUS | Status: DC

## 2022-12-08 MED ORDER — DILTIAZEM HCL 30 MG PO TABS: 30.0000 mg | ORAL_TABLET | Freq: Every day | ORAL | Status: DC | PRN

## 2022-12-08 MED ORDER — POLYETHYLENE GLYCOL 3350 17 G PO PACK
17.0000 g | PACK | Freq: Every day | ORAL | Status: DC
  Administered 2022-12-09: 17 g via ORAL
  Filled 2022-12-08 (×3): qty 1

## 2022-12-08 MED ORDER — ACETAMINOPHEN 325 MG PO TABS
650.0000 mg | ORAL_TABLET | Freq: Once | ORAL | Status: AC
  Administered 2022-12-08: 650 mg via ORAL
  Filled 2022-12-08: qty 2

## 2022-12-08 MED ORDER — IOHEXOL 300 MG/ML  SOLN
100.0000 mL | Freq: Once | INTRAMUSCULAR | Status: AC | PRN
  Administered 2022-12-08: 100 mL via INTRAVENOUS

## 2022-12-08 MED ORDER — ONDANSETRON HCL 4 MG PO TABS: 4.0000 mg | ORAL_TABLET | Freq: Four times a day (QID) | ORAL | Status: DC | PRN

## 2022-12-08 MED ORDER — ACETAMINOPHEN 650 MG RE SUPP: 650.0000 mg | Freq: Four times a day (QID) | RECTAL | Status: DC | PRN

## 2022-12-08 MED ORDER — IRBESARTAN 150 MG PO TABS
150.0000 mg | ORAL_TABLET | Freq: Every day | ORAL | Status: DC
  Administered 2022-12-09 – 2022-12-11 (×3): 150 mg via ORAL
  Filled 2022-12-08 (×4): qty 1

## 2022-12-08 MED ORDER — ABIRATERONE ACETATE 250 MG PO TABS: 500.0000 mg | ORAL_TABLET | ORAL | Status: DC

## 2022-12-08 MED ORDER — ADULT MULTIVITAMIN W/MINERALS CH
1.0000 | ORAL_TABLET | Freq: Every day | ORAL | Status: DC
  Administered 2022-12-09 – 2022-12-11 (×3): 1 via ORAL
  Filled 2022-12-08 (×4): qty 1

## 2022-12-08 MED ORDER — HYDROCORTISONE 10 MG PO TABS
10.0000 mg | ORAL_TABLET | Freq: Two times a day (BID) | ORAL | Status: DC
  Administered 2022-12-09 – 2022-12-11 (×5): 10 mg via ORAL
  Filled 2022-12-08 (×6): qty 1

## 2022-12-08 MED ORDER — GLIPIZIDE ER 5 MG PO TB24
5.0000 mg | ORAL_TABLET | Freq: Every day | ORAL | Status: DC
  Administered 2022-12-09 – 2022-12-11 (×3): 5 mg via ORAL
  Filled 2022-12-08 (×4): qty 1

## 2022-12-08 MED ORDER — MAGNESIUM HYDROXIDE 400 MG/5ML PO SUSP: 30.0000 mL | Freq: Every day | ORAL | Status: DC | PRN

## 2022-12-08 MED ORDER — METRONIDAZOLE 500 MG/100ML IV SOLN
500.0000 mg | Freq: Two times a day (BID) | INTRAVENOUS | Status: DC
  Administered 2022-12-09 (×2): 500 mg via INTRAVENOUS
  Filled 2022-12-08 (×2): qty 100

## 2022-12-08 MED ORDER — ONDANSETRON HCL 4 MG/2ML IJ SOLN
4.0000 mg | Freq: Four times a day (QID) | INTRAMUSCULAR | Status: DC | PRN
  Administered 2022-12-10: 4 mg via INTRAVENOUS
  Filled 2022-12-08: qty 2

## 2022-12-08 MED ORDER — VANCOMYCIN HCL IN DEXTROSE 1-5 GM/200ML-% IV SOLN: 1000.0000 mg | Freq: Once | INTRAVENOUS | Status: DC

## 2022-12-08 MED ORDER — ACETAMINOPHEN 325 MG PO TABS
650.0000 mg | ORAL_TABLET | Freq: Four times a day (QID) | ORAL | Status: DC | PRN
  Administered 2022-12-09 – 2022-12-10 (×3): 650 mg via ORAL
  Filled 2022-12-08 (×4): qty 2

## 2022-12-08 MED ORDER — RISAQUAD PO CAPS
1.0000 | ORAL_CAPSULE | Freq: Every day | ORAL | Status: DC
  Administered 2022-12-09 – 2022-12-11 (×3): 1 via ORAL
  Filled 2022-12-08 (×4): qty 1

## 2022-12-08 MED ORDER — AMLODIPINE BESYLATE 5 MG PO TABS
2.5000 mg | ORAL_TABLET | Freq: Every day | ORAL | Status: DC
  Administered 2022-12-09 – 2022-12-11 (×3): 2.5 mg via ORAL
  Filled 2022-12-08: qty 1
  Filled 2022-12-08: qty 0.5
  Filled 2022-12-08 (×2): qty 1

## 2022-12-08 MED ORDER — PREGABALIN 25 MG PO CAPS
25.0000 mg | ORAL_CAPSULE | Freq: Two times a day (BID) | ORAL | Status: DC
  Administered 2022-12-09 – 2022-12-10 (×4): 25 mg via ORAL
  Filled 2022-12-08 (×6): qty 1

## 2022-12-08 MED ORDER — SODIUM CHLORIDE 0.9 % IV SOLN: INTRAVENOUS | Status: DC

## 2022-12-08 NOTE — ED Notes (Signed)
First Nurse Note: Pt to ED via [facility] from home for altered mental status, fever, and weakness that started today. Pt very weak which they said is not normal for him. Pt was in NAD when he arrived with EMS.  temp 101.3 CBG 107 HR 103 96% on RA 22 L Hand

## 2022-12-08 NOTE — ED Notes (Signed)
Assumed care of patient at this time. Report received from Cristie Hem, [place].

## 2022-12-08 NOTE — ED Notes (Signed)
Pt has also had several falls per family.

## 2022-12-08 NOTE — ED Provider Notes (Signed)
[facility] Provider Note    Event Date/Time   First MD Initiated Contact with Patient 12/08/22 2144     (approximate)   History   Weakness   HPI  Andrew Callahan is a 88 y.o. male who comes in with his wife..  She reports that he has been weak and somewhat confused today.  Normally he is able to walk and talk and be fine.  Today he could not get up out of his recliner to get to his rollator and use it.  He has a fever of 10 1-1 02 here.  He himself denies any pain or coughing or shortness of breath or nausea or vomiting.  He however is lying in bed with his eyes closed and not wanting to do much of anything although he will follow commands.      Physical Exam   Triage Vital Signs: ED Triage Vitals  Enc Vitals Group     BP 12/08/22 1852 (!) 160/95     Pulse Rate 12/08/22 1852 90     Resp 12/08/22 1852 18     Temp 12/08/22 1852 98 F (36.7 C)     Temp Source 12/08/22 2052 Oral     SpO2 12/08/22 1852 98 %     Weight --      Height --      Head Circumference --      Peak Flow --      Pain Score 12/08/22 1830 0     Pain Loc --      Pain Edu? --      Excl. in [place]? --     Most recent vital signs: Vitals:   12/08/22 2117 12/08/22 2330  BP: (!) 152/78 109/67  Pulse: 88 69  Resp: (!) 23 20  Temp: (!) 101 F (38.3 C) 99.5 F (37.5 C)  SpO2: 96% 95%    General: Awake, no distress. CV:  Good peripheral perfusion.  Heart regular rate and rhythm no audible murmurs Resp:  Normal effort.  Lungs are clear Abd:  No distention.  Soft and nontender Extremities: Trace edema bilaterally   ED Results / Procedures / Treatments   Labs (all labs ordered are listed, but only abnormal results are displayed) Labs Reviewed  BASIC METABOLIC PANEL - Abnormal; Notable for the following components:      Result Value   CO2 21 (*)    Calcium 8.5 (*)    All other components within normal limits  CBC - Abnormal; Notable for the following components:   Platelets  113 (*)    All other components within normal limits  URINALYSIS, ROUTINE W REFLEX MICROSCOPIC - Abnormal; Notable for the following components:   Color, Urine AMBER (*)    APPearance HAZY (*)    All other components within normal limits  COMPREHENSIVE METABOLIC PANEL - Abnormal; Notable for the following components:   Potassium 3.2 (*)    Glucose, Bld 106 (*)    Calcium 8.7 (*)    Total Protein 6.4 (*)    All other components within normal limits  CBC WITH DIFFERENTIAL/PLATELET - Abnormal; Notable for the following components:   Platelets 145 (*)    Monocytes Absolute 1.1 (*)    All other components within normal limits  BLOOD GAS, VENOUS - Abnormal; Notable for the following components:   pH, Ven 7.51 (*)    pCO2, Ven 32 (*)    pO2, Ven 79 (*)    Acid-Base Excess 3.0 (*)  All other components within normal limits  TROPONIN I (HIGH SENSITIVITY) - Abnormal; Notable for the following components:   Troponin I (High Sensitivity) 56 (*)    All other components within normal limits  TROPONIN I (HIGH SENSITIVITY) - Abnormal; Notable for the following components:   Troponin I (High Sensitivity) 67 (*)    All other components within normal limits  RESP PANEL BY RT-PCR (RSV, FLU A&B, COVID)  RVPGX2  CULTURE, BLOOD (ROUTINE X 2)  CULTURE, BLOOD (ROUTINE X 2)  LACTIC ACID, PLASMA  PROTIME-INR  CORTISOL-AM, BLOOD  PROCALCITONIN  BASIC METABOLIC PANEL  CBC  CBG MONITORING, ED     EKG  EKG read interpreted by me shows normal sinus rhythm rate of 97 left axis right bundle branch block also left anterior hemiblock per the computer.  There is some ST segment and T wave inversion in aVL this is similar to EKG from December of last year.  Last year patient appeared to have left bundle branch block.   RADIOLOGY CT of the head read by radiology reviewed and interpreted by me showed a small amount of fluid in the right mastoid air cells.  Patient denied any pain or tenderness there. Chest  x-ray read by radiology reviewed and interpreted by me showed possibly some basilar atelectasis.  PROCEDURES:  Critical Care performed:   Procedures   MEDICATIONS ORDERED IN ED: Medications  abiraterone acetate (ZYTIGA) tablet 500 mg (has no administration in time range)  amLODipine (NORVASC) tablet 2.5 mg (has no administration in time range)  irbesartan (AVAPRO) tablet 150 mg (has no administration in time range)  diltiazem (CARDIZEM) tablet 30 mg (has no administration in time range)  glipiZIDE (GLUCOTROL XL) 24 hr tablet 5 mg (has no administration in time range)  hydrocortisone (CORTEF) tablet 10 mg (has no administration in time range)  levothyroxine (SYNTHROID) tablet 50 mcg (has no administration in time range)  polyethylene glycol (MIRALAX / GLYCOLAX) packet 17 g (has no administration in time range)  acidophilus (RISAQUAD) capsule 1 capsule (has no administration in time range)  apixaban (ELIQUIS) tablet 5 mg (has no administration in time range)  pregabalin (LYRICA) capsule 25 mg (has no administration in time range)  multivitamin with minerals tablet 1 tablet (has no administration in time range)  0.9 %  sodium chloride infusion (has no administration in time range)  metroNIDAZOLE (FLAGYL) IVPB 500 mg (500 mg Intravenous New Bag/Given 12/09/22 0020)  acetaminophen (TYLENOL) tablet 650 mg (has no administration in time range)    Or  acetaminophen (TYLENOL) suppository 650 mg (has no administration in time range)  traZODone (DESYREL) tablet 25 mg (has no administration in time range)  magnesium hydroxide (MILK OF MAGNESIA) suspension 30 mL (has no administration in time range)  ondansetron (ZOFRAN) tablet 4 mg (has no administration in time range)    Or  ondansetron (ZOFRAN) injection 4 mg (has no administration in time range)  ceFEPIme (MAXIPIME) 2 g in sodium chloride 0.9 % 100 mL IVPB (2 g Intravenous New Bag/Given 12/09/22 0020)  vancomycin (VANCOREADY) IVPB 2000  mg/400 mL (has no administration in time range)  acetaminophen (TYLENOL) tablet 650 mg (650 mg Oral Given 12/08/22 2052)  iohexol (OMNIPAQUE) 300 MG/ML solution 100 mL (100 mLs Intravenous Contrast Given 12/08/22 2352)     IMPRESSION / MDM / ASSESSMENT AND PLAN / ED COURSE  I reviewed the triage vital signs and the nursing notes. Patient with high fever weakness and intermittent confusion no obvious source has been found  so far I ordered CT chest abdomen and pelvis but hospitalist was called unable to admit the patient for possible sepsis prior to the return of the CT.  Differential diagnosis includes, but is not limited to, febrile illness from COVID or flu or pneumonia or intra-abdominal source.  Patient does not exhibiting any signs of meningitis has no stiff neck or headache or mastoid pain  Patient's presentation is most consistent with acute presentation with potential threat to life or bodily function.  The patient is on the cardiac monitor to evaluate for evidence of arrhythmia and/or significant heart rate changes.  None were seen    FINAL CLINICAL IMPRESSION(S) / ED DIAGNOSES   Final diagnoses:  Generalized weakness  Fever, unspecified fever cause  Delirium     Rx / DC Orders   ED Discharge Orders     None        Note:  This document was prepared using Dragon voice recognition software and may include unintentional dictation errors.   Nena Polio, MD 12/09/22 Shelah Lewandowsky

## 2022-12-08 NOTE — H&P (Addendum)
[place]   PATIENT NAME: Andrew Callahan    MR#:  [id number]  DATE OF BIRTH:  Mar 15, 1934  DATE OF ADMISSION:  12/08/2022  PRIMARY CARE PHYSICIAN: Rusty Aus, MD   Patient is coming from: Home  REQUESTING/REFERRING PHYSICIAN: Conni Slipper, MD  CHIEF COMPLAINT:   Chief Complaint  Patient presents with   Weakness    HISTORY OF PRESENT ILLNESS:  Andrew Callahan is a 88 y.o. male with medical history significant for CVA, atrial fibrillation, hypothyroidism, hypertension, dyslipidemia, paroxysmal atrial fibrillation, OSA on CPAP and CVA, who presented to the emergency room with acute onset of generalized weakness and fall while he was getting the stools to sit and eat.  He sustained no injuries.  He has been having cough since last and this morning was having mild sore throat.  He was noted to have a fever that was up to 103 before his wife called EMS.  His wife stated that he was mildly confused.  No neck pain or stiffness.  No recent dysuria, oliguria or hematuria or flank pain.  He has overactive bladder.  No diarrhea or melena or bright red bleeding per rectum.  No chest pain or palpitations.  ED Course: Upon presentation to the emergency room, BP was 160/95 with otherwise normal vital signs.  Labs revealed ABG with pH 7.51, pCO2 of 32 and pO2 of 79 with O2 sat of 96.9% and HCO3 25.5.  CBC showed normal hypokalemia 3.2.  Total protein was 6.4.  High sensitive troponin was 56 and later 67.  CBC was within normal except for mild thrombocytopenia of 145.  Lactic acid was 1.6.  Influenza antigens, COVID-19 PCR and RSV PCR came back negative.  UA was unremarkable.  2 blood culture were drawn. EKG as reviewed by me : EKG showed normal sinus rhythm with a rate of 97 with right bundle branch block and left anterior fascicular block (bifascicular block) and LVH with repolarization abnormality. Imaging: Portable chest x-ray showed low lung volumes with bibasal dependent atelectasis..   Abdominal and pelvic CT scan report is pending.  The patient was started on broad-spectrum IV antibiotic therapy.  He will be admitted to a medical telemetry bed for further evaluation and management. PAST MEDICAL HISTORY:   Past Medical History:  Diagnosis Date   Acquired hypothyroidism    Aortic atherosclerosis ([place])    Atrial fibrillation ([place])    BCC (basal cell carcinoma) 11/09/2022   left posterior upper arm, EDC scheduled   Chronic constipation    CVA (cerebral vascular accident) ([place])    DDD (degenerative disc disease), cervical    Diabetes mellitus without complication ([place])    Diverticulosis    Elevated prostate specific antigen (PSA)    Family history of leukemia    Family history of lung cancer    History of kidney stones    History of pituitary adenoma    Hyperlipidemia    Hypertension    Hypogonadism in male    Hypothyroid    Hypothyroidism    IDA (iron deficiency anemia)    Lumbar disc disease    Nephrolithiasis    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 Mohs.   Stroke ([place])    throat / slight difficulty swallowing  PAST SURGICAL HISTORY:   Past Surgical History:  Procedure Laterality Date   BACK SURGERY  1997   L3, 4, 5 laminectomy    BACK SURGERY  1998   hard wire removed, fusion of L3-5   BACK SURGERY  2009   laminotomy/foraminotomy w/ decompression of nerve root, facet thermal ablation L2-3.    COLONOSCOPY     COLONOSCOPY WITH PROPOFOL N/A 02/04/2021   Procedure: COLONOSCOPY WITH PROPOFOL;  Surgeon: Lesly Rubenstein, MD;  Location: [facility] ENDOSCOPY;  Service: Endoscopy;  Laterality: N/A;  STAT CBC BEFORE PROCEDURE   ESOPHAGOGASTRODUODENOSCOPY (EGD) WITH PROPOFOL N/A 02/04/2021   Procedure: ESOPHAGOGASTRODUODENOSCOPY (EGD) WITH PROPOFOL;  Surgeon: Lesly Rubenstein, MD;  Location: [facility] ENDOSCOPY;  Service: Endoscopy;  Laterality: N/A;   HERNIA REPAIR     HOLEP-LASER ENUCLEATION OF THE PROSTATE WITH MORCELLATION N/A 06/18/2020   Procedure: HOLEP-LASER ENUCLEATION OF THE PROSTATE WITH MORCELLATION;  Surgeon: Billey Co, MD;  Location: [facility] ORS;  Service: Urology;  Laterality: N/A;   JOINT REPLACEMENT     left knee partial   KIDNEY STONE SURGERY  2010   KNEE SURGERY Left 2007   Arthroscopic partial knee replacement, replaced medial side    MOHS SURGERY Right 2014   squamous cell carcinoma   NECK SURGERY  2008   fusion with hardwire in place C3 through C7    pituitary adenoma  1994   benign   PROSTATE BIOPSY N/A 01/01/2020   Procedure: PROSTATE BIOPSY URO NAV FUSION;  Surgeon: Royston Cowper, MD;  Location: [facility] ORS;  Service: Urology;  Laterality: N/A;   [place] SURGERY  2016   fusion L4    TONSILLECTOMY      SOCIAL HISTORY:   Social History   Tobacco Use   Smoking status: Former    Passive exposure: Past   Smokeless tobacco: Never   Tobacco comments:    quit 50 years ago  Substance Use Topics   Alcohol use: Yes    Comment: rarely    FAMILY HISTORY:   Family History  Problem Relation Age of Onset   Stroke Mother    Heart attack Father    Leukemia Sister 73   Lung cancer Brother 76    DRUG ALLERGIES:   Allergies  Allergen Reactions   Metoprolol Other (See Comments)    dizziness    Rivaroxaban Other (See Comments)    hematuria     REVIEW OF SYSTEMS:   ROS As per history of present illness. All pertinent systems were reviewed above. Constitutional, HEENT, cardiovascular, respiratory, GI, GU, musculoskeletal, neuro, psychiatric, endocrine, integumentary and hematologic systems were reviewed and are otherwise negative/unremarkable except for positive findings mentioned above in the HPI.   MEDICATIONS AT HOME:   Prior to Admission medications   Medication Sig Start Date End Date Taking?  Authorizing Provider  abiraterone acetate (ZYTIGA) 250 MG tablet TAKE 2 TABLETS (500MG) BY MOUTH ONE TIME DAILY ON AN EMPTY STOMACH 1 HOUR BEFORE OR 2 HOURS AFTER A MEAL 11/30/22  Yes Darl Pikes, RPH-CPP  ACCU-CHEK GUIDE test strip daily. use as directed 02/02/21  Yes [provider]  Accu-Chek Softclix Lancets lancets daily. 02/02/21  Yes [provider]  amLODipine (NORVASC) 2.5 MG tablet Take 2.5 mg by mouth daily. 12/06/22 12/06/23 Yes [provider]  B Complex-C (SUPER B COMPLEX PO) Take 1 capsule by mouth daily.    Yes [provider]  Blood Glucose Monitoring Suppl ([place])  w/Device KIT See admin instructions. 02/02/21  Yes [provider]  Calcium-Magnesium-Vitamin D (CALCIUM 1200+D3 PO) Take 2 tablets by mouth in the morning and at bedtime.   Yes [provider]  denosumab (XGEVA) 120 MG/1.7ML SOLN injection Inject 120 mg into the skin every 3 (three) months.   Yes [provider]  ELIQUIS 5 MG TABS tablet Take 5 mg by mouth 2 (two) times daily. 11/04/19  Yes [provider]  glipiZIDE (GLUCOTROL XL) 5 MG 24 hr tablet Take 5 mg by mouth daily with breakfast. 12/06/22  Yes [provider]  hydrocortisone (CORTEF) 10 MG tablet Take 10 mg by mouth 2 (two) times daily.  10/13/19  Yes [provider]  Iron, Ferrous Sulfate, 325 (65 Fe) MG TABS daily. 10/11/20  Yes [provider]  Leuprolide Acetate (ELIGARD [place]) Inject into the skin. 1 injection every 6 months.   Yes [provider]  levothyroxine (SYNTHROID) 50 MCG tablet Take by mouth. 12/06/21  Yes [provider]  Misc Natural Products (OSTEO BI-FLEX ADV JOINT SHIELD PO) Take 1 tablet by mouth daily.    Yes [provider]  Multiple Vitamin (MULTIVITAMIN WITH MINERALS) TABS tablet Take 1 tablet by mouth daily. One-A-Day Men's 50+   Yes [provider]  polyethylene glycol (MIRALAX / GLYCOLAX) 17 g  packet Take 17 g by mouth daily. W/coffee   Yes [provider]  pregabalin (LYRICA) 25 MG capsule Take 25 mg by mouth 2 (two) times daily. 11/17/22  Yes [provider]  Probiotic Product (ALIGN) 4 MG CAPS Take 4 mg by mouth daily.    Yes [provider]  valsartan (DIOVAN) 160 MG tablet Take 160 mg by mouth 2 (two) times daily. 06/29/22  Yes [provider]  cetirizine (ZYRTEC) 10 MG tablet Take 10 mg by mouth daily as needed for allergies.    [provider]  diltiazem (CARDIZEM) 30 MG tablet Take 30 mg by mouth daily as needed. 12/19/21   [provider]      VITAL SIGNS:  Blood pressure 136/66, pulse 68, temperature 99 F (37.2 C), resp. rate 20, height _0  (1.778 m), weight 111 kg, SpO2 95 %.  PHYSICAL EXAMINATION:  Physical Exam  GENERAL:  89 y.o.-year-old Caucasian male patient lying in the bed with no acute distress.  He was mildly somnolent but arousable. EYES: Pupils equal, round, reactive to light and accommodation. No scleral icterus. Extraocular muscles intact.  HEENT: Head atraumatic, normocephalic. Oropharynx and nasopharynx clear.  NECK:  Supple, no jugular venous distention. No thyroid enlargement, no tenderness.  LUNGS: Normal breath sounds bilaterally, no wheezing, rales,rhonchi or crepitation. No use of accessory muscles of respiration.  CARDIOVASCULAR: Regular rate and rhythm, S1, S2 normal. No murmurs, rubs, or gallops.  ABDOMEN: Soft, nondistended, nontender. Bowel sounds present. No organomegaly or mass.  EXTREMITIES: No pedal edema, cyanosis, or clubbing.  NEUROLOGIC: Cranial nerves II through XII are intact. Muscle strength 5/5 in all extremities. Sensation intact. Gait not checked.  PSYCHIATRIC: The patient is alert and oriented x 3.  Normal affect and good eye contact. SKIN: No obvious rash, lesion, or ulcer.   LABORATORY PANEL:   CBC Recent Labs  Lab 12/08/22 2149  WBC 7.4  HGB 13.1  HCT 39.4  PLT  145*   ------------------------------------------------------------------------------------------------------------------  Chemistries  Recent Labs  Lab 12/08/22 2149  NA 139  K 3.2*  CL 108  CO2 23  GLUCOSE 106*  BUN 20  CREATININE 0.86  CALCIUM 8.7*  AST 41  ALT 19  ALKPHOS 53  BILITOT 0.8   ------------------------------------------------------------------------------------------------------------------  Cardiac Enzymes No results for input(s): "TROPONINI" in the last 168 hours. ------------------------------------------------------------------------------------------------------------------  RADIOLOGY:  CT CHEST ABDOMEN PELVIS W CONTRAST  Result Date: 12/09/2022 CLINICAL DATA:  Generalized weakness and fevers EXAM: CT CHEST, ABDOMEN, AND PELVIS WITH CONTRAST TECHNIQUE: Multidetector CT imaging of the chest, abdomen and pelvis was performed following the standard protocol during bolus administration of intravenous contrast. RADIATION DOSE REDUCTION: This exam was performed according to the departmental dose-optimization program which includes automated exposure control, adjustment of the mA and/or kV according to patient size and/or use of iterative reconstruction technique. CONTRAST:  193m OMNIPAQUE IOHEXOL 300 MG/ML  SOLN COMPARISON:  Chest x-ray from the previous day. FINDINGS: CT CHEST FINDINGS Cardiovascular: Thoracic aorta demonstrates atherosclerotic calcifications. No aneurysmal dilatation or dissection is noted. Pulmonary artery as visualized is within normal limits although not timed for embolus evaluation. Heart is at the upper limits of normal in size. No significant coronary calcifications are noted. Mediastinum/Nodes: No enlarged mediastinal, hilar, or axillary lymph nodes. Thyroid gland, trachea, and esophagus demonstrate no significant findings. Lungs/Pleura: Lungs are well aerated bilaterally. Minimal basilar atelectasis/scarring is noted. No focal infiltrate or  effusion is seen. Musculoskeletal: Degenerative changes of the thoracic spine are noted. No acute rib abnormality is seen. Scattered sclerotic foci are noted within the bony structures consistent with the given clinical history of prostate carcinoma. CT ABDOMEN PELVIS FINDINGS Hepatobiliary: Gallbladder is well distended with dependent cholelithiasis. No complicating factors are seen. The liver is within normal limits. Pancreas: Unremarkable. No pancreatic ductal dilatation or surrounding inflammatory changes. Spleen: Normal in size without focal abnormality. Adrenals/Urinary Tract: Adrenal glands are within normal limits. Kidneys demonstrate a normal enhancement pattern bilaterally. No renal calculi or obstructive changes are seen. The bladder is partially distended. Stomach/Bowel: No obstructive or inflammatory changes of the colon are noted. The appendix is within normal limits. Small bowel and stomach are unremarkable. Vascular/Lymphatic: Aortic atherosclerosis. No enlarged abdominal or pelvic lymph nodes. Reproductive: Prostate has been removed. Other: No abdominal wall hernia or abnormality. No abdominopelvic ascites. Musculoskeletal: Scattered sclerotic foci are noted consistent with prior prostate carcinoma. Postsurgical changes are noted from L2 to L5. IMPRESSION: CT of the chest: No acute abnormality noted. CT of the abdomen and pelvis: No acute abnormality is seen. Cholelithiasis without complicating factors. Scattered sclerotic foci consistent with the previous history of prostate carcinoma. Electronically Signed   By: MInez CatalinaM.D.   On: 12/09/2022 02:53   DG Chest Portable 1 View  Result Date: 12/08/2022 CLINICAL DATA:  Weakness.  Altered mental status. EXAM: PORTABLE CHEST 1 VIEW COMPARISON:  Chest radiograph dated November 18, 2021 FINDINGS: The heart is normal in size. Atherosclerotic calcification of the aortic arch. Low lung volumes. Bibasilar dependent atelectasis. Thoracic spondylosis.  IMPRESSION: Low lung volumes with bibasilar dependent atelectasis. Electronically Signed   By: IKeane PoliceD.O.   On: 12/08/2022 22:10   CT HEAD WO CONTRAST (5MM)  Result Date: 12/08/2022 CLINICAL DATA:  Recent fall with altered mental status, initial encounter EXAM: CT HEAD WITHOUT CONTRAST TECHNIQUE: Contiguous axial images were obtained from the base of the skull through the vertex without intravenous contrast. RADIATION DOSE REDUCTION: This exam was performed according to the departmental dose-optimization program which includes automated exposure control, adjustment of the mA and/or kV according to patient size and/or use of iterative reconstruction technique. COMPARISON:  05/27/2021 MRI FINDINGS: Brain: No evidence of acute infarction, hemorrhage,  hydrocephalus, extra-axial collection or mass lesion/mass effect. Mild atrophic and chronic white matter ischemic changes are seen. Basal ganglia calcifications are noted. Vascular: No hyperdense vessel or unexpected calcification. Skull: Normal. Negative for fracture or focal lesion. Sinuses/Orbits: Orbits and their contents are within normal limits. Paranasal sinuses are unremarkable. Minimal fluid in the right mastoid air cells is seen. Other: None. IMPRESSION: Chronic atrophic and ischemic changes without acute abnormality. Minimal fluid within the right mastoid air cells. Electronically Signed   By: Inez Catalina M.D.   On: 12/08/2022 19:23      IMPRESSION AND PLAN:  Assessment and Plan: * SIRS (systemic inflammatory response syndrome) (HCC) - This is likely the reason of his generalized weakness. - The patient will be admitted to a medical telemetry bed. - We will need to rule out sepsis. - He will be continued on broad-spectrum IV antibiotic therapy with IV cefepime, vancomycin and Flagyl. - We will follow blood cultures. - Will be hydrated with IV normal saline.  Hypothyroidism - We will continue his Synthroid.  Fall at home, initial  encounter - This is likely secondary to his generalized weakness probably associated with his SIRS. - PT evaluation will be obtained.  Type 2 diabetes mellitus with peripheral neuropathy (HCC) - He will be placed on supplemental coverage with NovoLog. - We will continue glipizide.  Paroxysmal atrial fibrillation (HCC) - We will continue his Eliquis and as needed Cardizem  Prostate cancer ([place]) - We will continue his hormonal therapy.  Benign essential hypertension - We will continue her antihypertensives.  DVT prophylaxis: Lovenox.  Advanced Care Planning:  Code Status: The patient is DNR/DNI.  This was discussed with him and his wife.   Family Communication:  The plan of care was discussed in details with the patient (and family). I answered all questions. The patient agreed to proceed with the above mentioned plan. Further management will depend upon hospital course. Disposition Plan: Back to previous home environment Consults called: none.  All the records are reviewed and case discussed with ED provider.  Status is: Inpatient   At the time of the admission, it appears that the appropriate admission status for this patient is inpatient.  This is judged to be reasonable and necessary in order to provide the required intensity of service to ensure the patient's safety given the presenting symptoms, physical exam findings and initial radiographic and laboratory data in the context of comorbid conditions.  The patient requires inpatient status due to high intensity of service, high risk of further deterioration and high frequency of surveillance required.  I certify that at the time of admission, it is my clinical judgment that the patient will require inpatient hospital care extending more than 2 midnights.                            Dispo: The patient is from: Home              Anticipated d/c is to: Home              Patient currently is not medically stable to d/c.               Difficult to place patient: No  Christel Mormon M.D on 12/09/2022 at 3:25 AM  [facility]   From 7 PM-7 AM, contact night-coverage [email or web address]  CC: Primary care physician; Rusty Aus, MD

## 2022-12-09 ENCOUNTER — Inpatient Hospital Stay: Payer: Medicare Other

## 2022-12-09 DIAGNOSIS — E1142 Type 2 diabetes mellitus with diabetic polyneuropathy: Secondary | ICD-10-CM | POA: Insufficient documentation

## 2022-12-09 DIAGNOSIS — W19XXXA Unspecified fall, initial encounter: Secondary | ICD-10-CM

## 2022-12-09 DIAGNOSIS — Y92009 Unspecified place in unspecified non-institutional (private) residence as the place of occurrence of the external cause: Secondary | ICD-10-CM

## 2022-12-09 DIAGNOSIS — R6510 Systemic inflammatory response syndrome (SIRS) of non-infectious origin without acute organ dysfunction: Secondary | ICD-10-CM | POA: Diagnosis not present

## 2022-12-09 DIAGNOSIS — C61 Malignant neoplasm of prostate: Secondary | ICD-10-CM

## 2022-12-09 DIAGNOSIS — I480 Paroxysmal atrial fibrillation: Secondary | ICD-10-CM | POA: Insufficient documentation

## 2022-12-09 DIAGNOSIS — I10 Essential (primary) hypertension: Secondary | ICD-10-CM | POA: Diagnosis not present

## 2022-12-09 DIAGNOSIS — E039 Hypothyroidism, unspecified: Secondary | ICD-10-CM

## 2022-12-09 DIAGNOSIS — R531 Weakness: Principal | ICD-10-CM

## 2022-12-09 LAB — TROPONIN I (HIGH SENSITIVITY): Troponin I (High Sensitivity): 67 ng/L — ABNORMAL HIGH (ref <18)

## 2022-12-09 LAB — RESPIRATORY PANEL BY PCR

## 2022-12-09 LAB — BASIC METABOLIC PANEL
Anion gap: 11 (ref 5–15)
BUN: 20 mg/dL (ref 8–23)
CO2: 22 mmol/L (ref 22–32)
Calcium: 8.2 mg/dL — ABNORMAL LOW (ref 8.9–10.3)
Chloride: 107 mmol/L (ref 98–111)
Creatinine, Ser: 0.97 mg/dL (ref 0.61–1.24)
GFR, Estimated: >60 mL/min (ref >60)
Glucose, Bld: 111 mg/dL — ABNORMAL HIGH (ref 70–99)
Potassium: 3.3 mmol/L — ABNORMAL LOW (ref 3.5–5.1)
Sodium: 140 mmol/L (ref 135–145)

## 2022-12-09 LAB — CBC
HCT: 38.3 % — ABNORMAL LOW (ref 39.0–52.0)
Hemoglobin: 12.5 g/dL — ABNORMAL LOW (ref 13.0–17.0)
MCH: 29.1 pg (ref 26.0–34.0)
MCHC: 32.6 g/dL (ref 30.0–36.0)
MCV: 89.3 fL (ref 80.0–100.0)
Platelets: 152 10*3/uL (ref 150–400)
RBC: 4.29 MIL/uL (ref 4.22–5.81)
RDW: 14.6 % (ref 11.5–15.5)
WBC: 5.7 10*3/uL (ref 4.0–10.5)
nRBC: 0 % (ref 0.0–0.2)

## 2022-12-09 LAB — CBG MONITORING, ED: Glucose-Capillary: 170 mg/dL — ABNORMAL HIGH (ref 70–99)

## 2022-12-09 LAB — PROTIME-INR
INR: 1.1 (ref 0.8–1.2)
Prothrombin Time: 14.2 seconds (ref 11.4–15.2)

## 2022-12-09 LAB — PROCALCITONIN: Procalcitonin: 0.28 ng/mL

## 2022-12-09 LAB — CORTISOL-AM, BLOOD: Cortisol - AM: 1.5 ug/dL — ABNORMAL LOW (ref 6.7–22.6)

## 2022-12-09 MED ORDER — POTASSIUM CHLORIDE CRYS ER 20 MEQ PO TBCR
40.0000 meq | EXTENDED_RELEASE_TABLET | Freq: Two times a day (BID) | ORAL | Status: AC
  Administered 2022-12-09 (×2): 40 meq via ORAL
  Filled 2022-12-09 (×2): qty 2

## 2022-12-09 MED ORDER — SODIUM CHLORIDE 0.9 % IV SOLN
2.0000 g | Freq: Three times a day (TID) | INTRAVENOUS | Status: DC
  Administered 2022-12-09 (×3): 2 g via INTRAVENOUS
  Filled 2022-12-09 (×3): qty 12.5

## 2022-12-09 MED ORDER — VANCOMYCIN HCL 2000 MG/400ML IV SOLN
2000.0000 mg | INTRAVENOUS | Status: DC
  Administered 2022-12-09: 2000 mg via INTRAVENOUS
  Filled 2022-12-09 (×2): qty 400

## 2022-12-09 NOTE — Assessment & Plan Note (Addendum)
-   This is likely secondary to his generalized weakness probably associated with his SIRS. - PT evaluation will be obtained.

## 2022-12-09 NOTE — Assessment & Plan Note (Signed)
-   Working continue glipizide. - He will be placed on supplemental coverage with NovoLog.

## 2022-12-09 NOTE — Assessment & Plan Note (Signed)
-   We will continue his Synthroid.

## 2022-12-09 NOTE — Progress Notes (Signed)
Pharmacy Antibiotic Note  Andrew Callahan is a 88 y.o. male admitted on 12/08/2022 with infection of unknown source.  Pharmacy has been consulted for Cefepime & Vancomycin dosing for 7 days.  Plan: Cefepime 2 gm q24hr per indication & renal fxn.  Vancomycin 2000 mg IV Q 24 hrs. Goal AUC 400-550. Expected AUC: 437.2 SCr used: 0.85,   Pharmacy will continue to follow and will adjust abx dosing whenever warranted.  Temp (24hrs), Avg:100.3 F (37.9 C), Min:98 F (36.7 C), Max:102.7 F (39.3 C)   Recent Labs  Lab 12/08/22 1831 12/08/22 2149  WBC 8.6 7.4  CREATININE 0.88 0.86  LATICACIDVEN  --  1.6    Estimated Creatinine Clearance: 76.9 mL/min (by C-G formula based on SCr of 0.86 mg/dL).    Allergies  Allergen Reactions   Metoprolol Other (See Comments)    dizziness    Rivaroxaban Other (See Comments)    hematuria     Antimicrobials this admission: 12/30 Cefepime >> x 7 days 12/30 Vancomycin >> x 7 days 12/30 Flagyl >> x 7 days  Microbiology results: 12/29 BCx: Pending  Thank you for allowing pharmacy to be a part of this patient's care.  Renda Rolls, PharmD, [facility] 12/09/2022 12:09 AM

## 2022-12-09 NOTE — Assessment & Plan Note (Signed)
-   We will continue her antihypertensives. 

## 2022-12-09 NOTE — Assessment & Plan Note (Signed)
-   We will continue his Eliquis and as needed Cardizem

## 2022-12-09 NOTE — ED Notes (Signed)
DO Sheikh at the bedside

## 2022-12-09 NOTE — Assessment & Plan Note (Addendum)
-   This is likely the reason of his generalized weakness. - The patient will be admitted to a medical telemetry bed. - We will need to rule out sepsis. - He will be continued on broad-spectrum IV antibiotic therapy with IV cefepime, vancomycin and Flagyl. - We will follow blood cultures. - Will be hydrated with IV normal saline.

## 2022-12-09 NOTE — Assessment & Plan Note (Signed)
-   We will continue his hormonal therapy.

## 2022-12-09 NOTE — Assessment & Plan Note (Signed)
-   We will continue his antihypertensives. 

## 2022-12-09 NOTE — Assessment & Plan Note (Signed)
-   He will be placed on supplemental coverage with NovoLog. - We will continue glipizide.

## 2022-12-09 NOTE — ED Notes (Signed)
Pt's family requesting to speak with provider about an update. DO Sheikh notified via secure chat.

## 2022-12-09 NOTE — Progress Notes (Signed)
PROGRESS NOTE    Andrew Callahan  LPF:[id number] DOB: 04-Feb-1934 DOA: 12/08/2022 PCP: Rusty Aus, MD   Brief Narrative:  HPI per Dr.Jan Mansy on 12/08/22 Add Andrew Callahan is a 88 y.o. male with medical history significant for CVA, atrial fibrillation, hypothyroidism, hypertension, dyslipidemia, paroxysmal atrial fibrillation, OSA on CPAP and CVA, who presented to the emergency room with acute onset of generalized weakness and fall while he was getting the stools to sit and eat.  He sustained no injuries.  He has been having cough since last and this morning was having mild sore throat.  He was noted to have a fever that was up to 103 before his wife called EMS.  His wife stated that he was mildly confused.  No neck pain or stiffness.  No recent dysuria, oliguria or hematuria or flank pain.  He has overactive bladder.  No diarrhea or melena or bright red bleeding per rectum.  No chest pain or palpitations.   ED Course: Upon presentation to the emergency room, BP was 160/95 with otherwise normal vital signs.  Labs revealed ABG with pH 7.51, pCO2 of 32 and pO2 of 79 with O2 sat of 96.9% and HCO3 25.5.  CBC showed normal hypokalemia 3.2.  Total protein was 6.4.  High sensitive troponin was 56 and later 67.  CBC was within normal except for mild thrombocytopenia of 145.  Lactic acid was 1.6.  Influenza antigens, COVID-19 PCR and RSV PCR came back negative.  UA was unremarkable.  2 blood culture were drawn. EKG as reviewed by me : EKG showed normal sinus rhythm with a rate of 97 with right bundle branch block and left anterior fascicular block (bifascicular block) and LVH with repolarization abnormality. Imaging: Portable chest x-ray showed low lung volumes with bibasal dependent atelectasis..  Abdominal and pelvic CT scan report is pending.   The patient was started on broad-spectrum IV antibiotic therapy.  He will be admitted to a medical telemetry bed for further evaluation and management.  **Interim  History  Cultures have been negative and his fever has broken.  We will de-escalate and monitor off of Abx to see if fever reoccurs  Assessment and Plan: * SIRS (systemic inflammatory response syndrome) (HCC) - This is likely the reason of his generalized weakness. - The patient will be admitted to a medical telemetry bed. - We will need to rule out sepsis but cultures have been negative so far. -PCT was 0.28 -Respiratory Virus Panel Negative and Flu and SARS-CoV2 Negative -U/A Negative  -He will be continued on broad-spectrum IV antibiotic therapy with IV cefepime, vancomycin and Flagyl but will monitor off of Abx and Unclear Etiology of Temp -We will follow blood cultures and showed NGTD <12 Days -CT Chest/Abd/Pelvis "CT of the chest: No acute abnormality noted. CT of the abdomen and pelvis: No acute abnormality is seen. Cholelithiasis without complicating factors. Scattered sclerotic foci consistent with the previous history of prostate carcinoma." -Check LE Venous Duplex to Rule out DVT -Will be hydrated with IV normal saline at 100 mL/hr.  Hypothyroidism -Continue home Levothyroxine -Check TSH in the AM  Fall at home, initial encounter -This is likely secondary to his generalized weakness probably associated with his SIRS. -PT evaluation will be obtained.  Type 2 diabetes mellitus with peripheral neuropathy (HCC) -He will be placed on supplemental coverage with NovoLog. -We will continue Glipizide.  Hypokalemia -Patient's K+ went from 3.2 -> 3.3 -Replete  -Check Mag Level in the AM -Continue to Monitor and Replete  as Necessary  Paroxysmal atrial fibrillation (Andrew Callahan) -We will continue his Eliquis and as needed Cardizem -Continue monitor on telemetry  Prostate Cancer (Andrew Callahan) -We will continue his hormonal therapy with Zytiga 500 mg p.o. daily  Elevated Troponin -Mild and likely reactive -Troponin level went from 56 now 67 -No Chest Pain   Benign essential  hypertension -We will continue home antihypertensives with irbesartan substitution of 150 mg p.o. daily and diltiazem 30 mg p.o. q APRN and amlodipine 2.5 mg p.o daily. -Continue to monitor blood pressures per protocol -Last blood pressure reading was 143/72  Normocytic Anemia -Hgb/Hct Trend: Recent Labs  Lab 11/30/22 1259 12/08/22 1831 12/08/22 2149 12/09/22 0521  HGB 13.2 13.6 13.1 12.5*  HCT 38.6* 42.9 39.4 38.3*  -Check anemia panel in the a.m. -Continue to monitor for signs and symptoms of bleeding; no overt bleeding noted -Repeat CBC in a.m.  Thrombocytopenia -Platelet Count Trend: Recent Labs  Lab 11/30/22 1259 12/08/22 1831 12/08/22 2149 12/09/22 0521  PLT 198 113* 145* 152  -Continue to Monitor for S/Sx of Bleeding; No overt bleeding noted -Repeat CBC in the AM  Obesity -Complicates overall prognosis and care -Estimated body mass index is 35.11 kg/m as calculated from the following:   Height as of this encounter: '5\' 10"'$  (1.778 m).   Weight as of this encounter: 111 kg.  -Weight Loss and Dietary Counseling given  DVT prophylaxis:  apixaban (ELIQUIS) tablet 5 mg    Code Status: DNR Family Communication: Discussed with wife at bedside  Disposition Plan:  Level of care: Telemetry Medical Status is: Inpatient Remains inpatient appropriate because: He is seen sure that he is afebrile for least 24 hours prior to safe discharge disposition will discontinue antibiotics to see if he spikes a temperature   Consultants:  None  Procedures:  As above  Antimicrobials:  Anti-infectives (From admission, onward)    Start     Dose/Rate Route Frequency Ordered Stop   12/09/22 0100  ceFEPIme (MAXIPIME) 2 g in sodium chloride 0.9 % 100 mL IVPB        2 g 200 mL/hr over 30 Minutes Intravenous Every 8 hours 12/09/22 0006 12/16/22 0059   12/09/22 0015  vancomycin (VANCOREADY) IVPB 2000 mg/400 mL        2,000 mg 200 mL/hr over 120 Minutes Intravenous Every 24 hours  12/09/22 0009 12/16/22 0014   12/09/22 0000  ceFEPIme (MAXIPIME) 2 g in sodium chloride 0.9 % 100 mL IVPB  Status:  Discontinued        2 g 200 mL/hr over 30 Minutes Intravenous  Once 12/08/22 2357 12/09/22 0006   12/09/22 0000  metroNIDAZOLE (FLAGYL) IVPB 500 mg        500 mg 100 mL/hr over 60 Minutes Intravenous Every 12 hours 12/08/22 2357 12/15/22 2359   12/09/22 0000  vancomycin (VANCOCIN) IVPB 1000 mg/200 mL premix  Status:  Discontinued        1,000 mg 200 mL/hr over 60 Minutes Intravenous  Once 12/08/22 2357 12/09/22 0009       Subjective: Seen and examined at bedside and he is feeling well.  Denies any chest pain or shortness of breath.  Fever has broken and he denies any complaints of chest pain shortness of breath, burning or discomfort in urine but does have some abdominal discomfort but thinks it is related to medication.  No other concerns or complaints at this time.  Objective: Vitals:   12/09/22 1000 12/09/22 1400 12/09/22 1452 12/09/22 1700  BP: Marland Kitchen)  110/59 (!) 168/76  (!) 143/72  Pulse: 62 71  (!) 58  Resp: '19 20  16  '$ Temp:   98 F (36.7 C)   TempSrc:   Oral   SpO2: 97% 95%  93%  Weight:      Height:        Intake/Output Summary (Last 24 hours) at 12/09/2022 1813 Last data filed at 12/09/2022 3295 Gross per 24 hour  Intake 597.08 ml  Output 700 ml  Net -102.92 ml   Filed Weights   12/09/22 0000  Weight: 111 kg   Examination: Physical Exam:  Constitutional: WN/WD obese Caucasian male in NAD Respiratory: Diminished to auscultation bilaterally, no wheezing, rales, rhonchi or crackles. Normal respiratory effort and patient is not tachypenic. No accessory muscle use. Unlabored breathing  Cardiovascular: RRR, no murmurs / rubs / gallops. S1 and S2 auscultated. Trace extremity edema.  Abdomen: Soft, non-tender, Distended 2/2 body habitus. Bowel sounds positive.  GU: Deferred. Musculoskeletal: No clubbing / cyanosis of digits/nails. No joint deformity upper  and lower extremities.  Skin: No rashes, lesions, ulcers on a limited skin evaluation. No induration; Warm and dry.  Neurologic: CN 2-12 grossly intact with no focal deficits. Romberg sign and cerebellar reflexes not assessed.  Psychiatric: Normal judgment and insight. Alert and oriented x 3. Normal mood and appropriate affect.   Data Reviewed: I have personally reviewed following labs and imaging studies  CBC: Recent Labs  Lab 12/08/22 1831 12/08/22 2149 12/09/22 0521  WBC 8.6 7.4 5.7  NEUTROABS  --  4.8  --   HGB 13.6 13.1 12.5*  HCT 42.9 39.4 38.3*  MCV 93.5 88.7 89.3  PLT 113* 145* 188   Basic Metabolic Panel: Recent Labs  Lab 12/08/22 1831 12/08/22 2149 12/09/22 0521  NA 141 139 140  K 3.6 3.2* 3.3*  CL 111 108 107  CO2 21* 23 22  GLUCOSE 76 106* 111*  BUN '18 20 20  '$ CREATININE 0.88 0.86 0.97  CALCIUM 8.5* 8.7* 8.2*   GFR: Estimated Creatinine Clearance: 68.2 mL/min (by C-G formula based on SCr of 0.97 mg/dL). Liver Function Tests: Recent Labs  Lab 12/08/22 2149  AST 41  ALT 19  ALKPHOS 53  BILITOT 0.8  PROT 6.4*  ALBUMIN 3.6   No results for input(s): "LIPASE", "AMYLASE" in the last 168 hours. No results for input(s): "AMMONIA" in the last 168 hours. Coagulation Profile: Recent Labs  Lab 12/09/22 0521  INR 1.1   Cardiac Enzymes: No results for input(s): "CKTOTAL", "CKMB", "CKMBINDEX", "TROPONINI" in the last 168 hours. BNP (last 3 results) No results for input(s): "PROBNP" in the last 8760 hours. HbA1C: No results for input(s): "HGBA1C" in the last 72 hours. CBG: Recent Labs  Lab 12/08/22 2121 12/09/22 1752  GLUCAP 95 170*   Lipid Profile: No results for input(s): "CHOL", "HDL", "LDLCALC", "TRIG", "CHOLHDL", "LDLDIRECT" in the last 72 hours. Thyroid Function Tests: No results for input(s): "TSH", "T4TOTAL", "FREET4", "T3FREE", "THYROIDAB" in the last 72 hours. Anemia Panel: No results for input(s): "VITAMINB12", "FOLATE", "FERRITIN",  "TIBC", "IRON", "RETICCTPCT" in the last 72 hours. Sepsis Labs: Recent Labs  Lab 12/08/22 2149 12/09/22 0521  PROCALCITON  --  0.28  LATICACIDVEN 1.6  --     Recent Results (from the past 240 hour(s))  Resp panel by RT-PCR (RSV, Flu A&B, Covid) Anterior Nasal Swab     Status: None   Collection Time: 12/08/22  9:49 PM   Specimen: Anterior Nasal Swab  Result Value Ref Range Status  SARS Coronavirus 2 by RT PCR NEGATIVE NEGATIVE Final    Comment: (NOTE) SARS-CoV-2 target nucleic acids are NOT DETECTED.  The SARS-CoV-2 RNA is generally detectable in upper respiratory specimens during the acute phase of infection. The lowest concentration of SARS-CoV-2 viral copies this assay can detect is 138 copies/mL. A negative result does not preclude SARS-Cov-2 infection and should not be used as the sole basis for treatment or other patient management decisions. A negative result may occur with  improper specimen collection/handling, submission of specimen other than nasopharyngeal swab, presence of viral mutation(s) within the areas targeted by this assay, and inadequate number of viral copies(<138 copies/mL). A negative result must be combined with clinical observations, patient history, and epidemiological information. The expected result is Negative.  Fact Sheet for Patients:  EntrepreneurPulse.com.au  Fact Sheet for Healthcare Providers:  IncredibleEmployment.be  This test is no t yet approved or cleared by the [place] FDA and  has been authorized for detection and/or diagnosis of SARS-CoV-2 by FDA under an Emergency Use Authorization (EUA). This EUA will remain  in effect (meaning this test can be used) for the duration of the COVID-19 declaration under Section 564(b)(1) of the Act, 21 U.S.C.section 360bbb-3(b)(1), unless the authorization is terminated  or revoked sooner.       Influenza A by PCR NEGATIVE NEGATIVE Final   Influenza B  by PCR NEGATIVE NEGATIVE Final    Comment: (NOTE) The Xpert Xpress SARS-CoV-2/FLU/RSV plus assay is intended as an aid in the diagnosis of influenza from Nasopharyngeal swab specimens and should not be used as a sole basis for treatment. Nasal washings and aspirates are unacceptable for Xpert Xpress SARS-CoV-2/FLU/RSV testing.  Fact Sheet for Patients: EntrepreneurPulse.com.au  Fact Sheet for Healthcare Providers: IncredibleEmployment.be  This test is not yet approved or cleared by the [place] FDA and has been authorized for detection and/or diagnosis of SARS-CoV-2 by FDA under an Emergency Use Authorization (EUA). This EUA will remain in effect (meaning this test can be used) for the duration of the COVID-19 declaration under Section 564(b)(1) of the Act, 21 U.S.C. section 360bbb-3(b)(1), unless the authorization is terminated or revoked.     Resp Syncytial Virus by PCR NEGATIVE NEGATIVE Final    Comment: (NOTE) Fact Sheet for Patients: EntrepreneurPulse.com.au  Fact Sheet for Healthcare Providers: IncredibleEmployment.be  This test is not yet approved or cleared by the [place] FDA and has been authorized for detection and/or diagnosis of SARS-CoV-2 by FDA under an Emergency Use Authorization (EUA). This EUA will remain in effect (meaning this test can be used) for the duration of the COVID-19 declaration under Section 564(b)(1) of the Act, 21 U.S.C. section 360bbb-3(b)(1), unless the authorization is terminated or revoked.  Performed at [facility], [address] [place], [place] [address]   Blood culture (routine x 2)     Status: None (Preliminary result)   Collection Time: 12/08/22 10:09 PM   Specimen: BLOOD LEFT ARM  Result Value Ref Range Status   Specimen Description BLOOD LEFT ARM  Final   Special Requests   Final    BOTTLES DRAWN AEROBIC AND ANAEROBIC Blood Culture  results may not be optimal due to an excessive volume of blood received in culture bottles   Culture   Final    NO GROWTH < 12 HOURS Performed at [facility], [address], [place], [place] [address]    Report Status PENDING  Incomplete  Blood culture (routine x 2)     Status:  None (Preliminary result)   Collection Time: 12/08/22 10:09 PM   Specimen: BLOOD RIGHT HAND  Result Value Ref Range Status   Specimen Description BLOOD RIGHT HAND  Final   Special Requests   Final    BOTTLES DRAWN AEROBIC AND ANAEROBIC Blood Culture results may not be optimal due to an inadequate volume of blood received in culture bottles   Culture   Final    NO GROWTH < 12 HOURS Performed at [facility], [address] [place], [place] [address]    Report Status PENDING  Incomplete  Respiratory (~20 pathogens) panel by PCR     Status: None   Collection Time: 12/09/22 11:11 AM   Specimen: Respiratory  Result Value Ref Range Status   Adenovirus NOT DETECTED NOT DETECTED Final   Coronavirus 229E NOT DETECTED NOT DETECTED Final    Comment: (NOTE) The Coronavirus on the Respiratory Panel, DOES NOT test for the novel  Coronavirus (2019 nCoV)    Coronavirus HKU1 NOT DETECTED NOT DETECTED Final   Coronavirus NL63 NOT DETECTED NOT DETECTED Final   Coronavirus OC43 NOT DETECTED NOT DETECTED Final   Metapneumovirus NOT DETECTED NOT DETECTED Final   Rhinovirus / Enterovirus NOT DETECTED NOT DETECTED Final   Influenza A NOT DETECTED NOT DETECTED Final   Influenza B NOT DETECTED NOT DETECTED Final   Parainfluenza Virus 1 NOT DETECTED NOT DETECTED Final   Parainfluenza Virus 2 NOT DETECTED NOT DETECTED Final   Parainfluenza Virus 3 NOT DETECTED NOT DETECTED Final   Parainfluenza Virus 4 NOT DETECTED NOT DETECTED Final   Respiratory Syncytial Virus NOT DETECTED NOT DETECTED Final   Bordetella pertussis NOT DETECTED NOT DETECTED Final   Bordetella Parapertussis NOT DETECTED NOT DETECTED Final    Chlamydophila pneumoniae NOT DETECTED NOT DETECTED Final   Mycoplasma pneumoniae NOT DETECTED NOT DETECTED Final    Comment: Performed at [facility], [place]. [address], [place],  [address]    Radiology Studies: CT CHEST ABDOMEN PELVIS W CONTRAST  Result Date: 12/09/2022 CLINICAL DATA:  Generalized weakness and fevers EXAM: CT CHEST, ABDOMEN, AND PELVIS WITH CONTRAST TECHNIQUE: Multidetector CT imaging of the chest, abdomen and pelvis was performed following the standard protocol during bolus administration of intravenous contrast. RADIATION DOSE REDUCTION: This exam was performed according to the departmental dose-optimization program which includes automated exposure control, adjustment of the mA and/or kV according to patient size and/or use of iterative reconstruction technique. CONTRAST:  128m OMNIPAQUE IOHEXOL 300 MG/ML  SOLN COMPARISON:  Chest x-ray from the previous day. FINDINGS: CT CHEST FINDINGS Cardiovascular: Thoracic aorta demonstrates atherosclerotic calcifications. No aneurysmal dilatation or dissection is noted. Pulmonary artery as visualized is within normal limits although not timed for embolus evaluation. Heart is at the upper limits of normal in size. No significant coronary calcifications are noted. Mediastinum/Nodes: No enlarged mediastinal, hilar, or axillary lymph nodes. Thyroid gland, trachea, and esophagus demonstrate no significant findings. Lungs/Pleura: Lungs are well aerated bilaterally. Minimal basilar atelectasis/scarring is noted. No focal infiltrate or effusion is seen. Musculoskeletal: Degenerative changes of the thoracic spine are noted. No acute rib abnormality is seen. Scattered sclerotic foci are noted within the bony structures consistent with the given clinical history of prostate carcinoma. CT ABDOMEN PELVIS FINDINGS Hepatobiliary: Gallbladder is well distended with dependent cholelithiasis. No complicating factors are seen. The liver is within normal  limits. Pancreas: Unremarkable. No pancreatic ductal dilatation or surrounding inflammatory changes. Spleen: Normal in size without focal abnormality. Adrenals/Urinary Tract: Adrenal glands are within  normal limits. Kidneys demonstrate a normal enhancement pattern bilaterally. No renal calculi or obstructive changes are seen. The bladder is partially distended. Stomach/Bowel: No obstructive or inflammatory changes of the colon are noted. The appendix is within normal limits. Small bowel and stomach are unremarkable. Vascular/Lymphatic: Aortic atherosclerosis. No enlarged abdominal or pelvic lymph nodes. Reproductive: Prostate has been removed. Other: No abdominal wall hernia or abnormality. No abdominopelvic ascites. Musculoskeletal: Scattered sclerotic foci are noted consistent with prior prostate carcinoma. Postsurgical changes are noted from L2 to L5. IMPRESSION: CT of the chest: No acute abnormality noted. CT of the abdomen and pelvis: No acute abnormality is seen. Cholelithiasis without complicating factors. Scattered sclerotic foci consistent with the previous history of prostate carcinoma. Electronically Signed   By: Inez Catalina M.D.   On: 12/09/2022 02:53   DG Chest Portable 1 View  Result Date: 12/08/2022 CLINICAL DATA:  Weakness.  Altered mental status. EXAM: PORTABLE CHEST 1 VIEW COMPARISON:  Chest radiograph dated November 18, 2021 FINDINGS: The heart is normal in size. Atherosclerotic calcification of the aortic arch. Low lung volumes. Bibasilar dependent atelectasis. Thoracic spondylosis. IMPRESSION: Low lung volumes with bibasilar dependent atelectasis. Electronically Signed   By: Keane Police D.O.   On: 12/08/2022 22:10   CT HEAD WO CONTRAST (5MM)  Result Date: 12/08/2022 CLINICAL DATA:  Recent fall with altered mental status, initial encounter EXAM: CT HEAD WITHOUT CONTRAST TECHNIQUE: Contiguous axial images were obtained from the base of the skull through the vertex without intravenous  contrast. RADIATION DOSE REDUCTION: This exam was performed according to the departmental dose-optimization program which includes automated exposure control, adjustment of the mA and/or kV according to patient size and/or use of iterative reconstruction technique. COMPARISON:  05/27/2021 MRI FINDINGS: Brain: No evidence of acute infarction, hemorrhage, hydrocephalus, extra-axial collection or mass lesion/mass effect. Mild atrophic and chronic white matter ischemic changes are seen. Basal ganglia calcifications are noted. Vascular: No hyperdense vessel or unexpected calcification. Skull: Normal. Negative for fracture or focal lesion. Sinuses/Orbits: Orbits and their contents are within normal limits. Paranasal sinuses are unremarkable. Minimal fluid in the right mastoid air cells is seen. Other: None. IMPRESSION: Chronic atrophic and ischemic changes without acute abnormality. Minimal fluid within the right mastoid air cells. Electronically Signed   By: Inez Catalina M.D.   On: 12/08/2022 19:23    Scheduled Meds:  abiraterone acetate  500 mg Oral UD   acidophilus  1 capsule Oral Daily   amLODipine  2.5 mg Oral Daily   apixaban  5 mg Oral BID   glipiZIDE  5 mg Oral Q breakfast   hydrocortisone  10 mg Oral BID WC   irbesartan  150 mg Oral Daily   levothyroxine  50 mcg Oral Q0600   multivitamin with minerals  1 tablet Oral Daily   polyethylene glycol  17 g Oral Daily   potassium chloride  40 mEq Oral BID   pregabalin  25 mg Oral BID   Continuous Infusions:  sodium chloride 100 mL/hr at 12/09/22 1720   ceFEPime (MAXIPIME) IV Stopped (12/09/22 1725)   metronidazole Stopped (12/09/22 1218)   vancomycin Stopped (12/09/22 0324)    LOS: 1 day   Raiford Noble, DO [facility] Available via Epic secure chat 7am-7pm After these hours, please refer to coverage provider listed on [email or web address] 12/09/2022, 6:13 PM

## 2022-12-10 ENCOUNTER — Inpatient Hospital Stay: Payer: Medicare Other

## 2022-12-10 DIAGNOSIS — J441 Chronic obstructive pulmonary disease with (acute) exacerbation: Secondary | ICD-10-CM | POA: Diagnosis not present

## 2022-12-10 DIAGNOSIS — E039 Hypothyroidism, unspecified: Secondary | ICD-10-CM | POA: Diagnosis not present

## 2022-12-10 DIAGNOSIS — R531 Weakness: Secondary | ICD-10-CM | POA: Diagnosis not present

## 2022-12-10 DIAGNOSIS — R6510 Systemic inflammatory response syndrome (SIRS) of non-infectious origin without acute organ dysfunction: Secondary | ICD-10-CM | POA: Diagnosis not present

## 2022-12-10 LAB — CBC WITH DIFFERENTIAL/PLATELET
Abs Immature Granulocytes: 0.01 10*3/uL (ref 0.00–0.07)
Basophils Absolute: 0 10*3/uL (ref 0.0–0.1)
Basophils Relative: 1 %
Eosinophils Absolute: 0.1 10*3/uL (ref 0.0–0.5)
Eosinophils Relative: 3 %
HCT: 37.2 % — ABNORMAL LOW (ref 39.0–52.0)
Hemoglobin: 12.5 g/dL — ABNORMAL LOW (ref 13.0–17.0)
Immature Granulocytes: 0 %
Lymphocytes Relative: 35 %
Lymphs Abs: 1.2 10*3/uL (ref 0.7–4.0)
MCH: 30 pg (ref 26.0–34.0)
MCHC: 33.6 g/dL (ref 30.0–36.0)
MCV: 89.4 fL (ref 80.0–100.0)
Monocytes Absolute: 0.5 10*3/uL (ref 0.1–1.0)
Monocytes Relative: 13 %
Neutro Abs: 1.7 10*3/uL (ref 1.7–7.7)
Neutrophils Relative %: 48 %
Platelets: 140 10*3/uL — ABNORMAL LOW (ref 150–400)
RBC: 4.16 MIL/uL — ABNORMAL LOW (ref 4.22–5.81)
RDW: 14.8 % (ref 11.5–15.5)
WBC: 3.5 10*3/uL — ABNORMAL LOW (ref 4.0–10.5)
nRBC: 0 % (ref 0.0–0.2)

## 2022-12-10 LAB — COMPREHENSIVE METABOLIC PANEL
ALT: 18 U/L (ref 0–44)
AST: 59 U/L — ABNORMAL HIGH (ref 15–41)
Albumin: 3.4 g/dL — ABNORMAL LOW (ref 3.5–5.0)
Alkaline Phosphatase: 42 U/L (ref 38–126)
Anion gap: 6 (ref 5–15)
BUN: 13 mg/dL (ref 8–23)
CO2: 21 mmol/L — ABNORMAL LOW (ref 22–32)
Calcium: 8 mg/dL — ABNORMAL LOW (ref 8.9–10.3)
Chloride: 115 mmol/L — ABNORMAL HIGH (ref 98–111)
Creatinine, Ser: 0.77 mg/dL (ref 0.61–1.24)
GFR, Estimated: >60 mL/min (ref >60)
Glucose, Bld: 116 mg/dL — ABNORMAL HIGH (ref 70–99)
Potassium: 4.2 mmol/L (ref 3.5–5.1)
Sodium: 142 mmol/L (ref 135–145)
Total Bilirubin: 1.3 mg/dL — ABNORMAL HIGH (ref 0.3–1.2)
Total Protein: 6.1 g/dL — ABNORMAL LOW (ref 6.5–8.1)

## 2022-12-10 LAB — PHOSPHORUS: Phosphorus: 2.8 mg/dL (ref 2.5–4.6)

## 2022-12-10 LAB — MAGNESIUM: Magnesium: 2.4 mg/dL (ref 1.7–2.4)

## 2022-12-10 MED ORDER — SODIUM CHLORIDE 0.9 % IV SOLN: INTRAVENOUS | Status: AC

## 2022-12-10 MED ORDER — ALUM & MAG HYDROXIDE-SIMETH 200-200-20 MG/5ML PO SUSP: 15.0000 mL | Freq: Four times a day (QID) | ORAL | Status: DC | PRN

## 2022-12-10 NOTE — TOC Initial Note (Signed)
Transition of Care [facility]) - Initial/Assessment Note    Patient Details  Name: Andrew Callahan MRN: [id number] Date of Birth: [birth year]  Transition of Care [facility]) CM/SW Contact:    Magnus Ivan, LCSW Phone Number: 12/10/2022, 4:25 PM  Clinical Narrative:    Patient on isolation. Attempted call into room with no success. Spoke with spouse Santiago Glad via phone regarding SNF rec.  Santiago Glad stated they are residents at [facility]. Patient's PCP is Dr. Sabra Heck. [facility] [address]. Patient has a rollator, shower seat, walker, bedside commode, and cane at home. Santiago Glad provides transportation.  Explained PT recs for HHPT. Santiago Glad stated that patient would like to use his 3 free respite days at [facility] prior to returning back to ILF. CSW spoke with Seth Bake at [facility] who confirmed this is an option. Seth Bake stated patient may not receive PT during the respite, but would get it through [facility] once he returns to his ILF setting. CSW inforemd Santiago Glad of this who stated this is fine. Seth Bake confirmed patient can come tomorrow 1/1 if medically ready. Seth Bake stated they will need FL2 that says [facility].  CSW completing FL2 and updated TOC handoff.  Expected Discharge Plan: Skilled Nursing Facility Barriers to Discharge: Continued Medical Work up   Patient Goals and CMS Choice Patient states their goals for this hospitalization and ongoing recovery are:: 3 day SNF respite CMS Medicare.gov Compare Post Acute Care list provided to:: Patient Represenative (must comment) Choice offered to / list presented to : Spouse      Expected Discharge Plan and Services       Living arrangements for the past 2 months: [place]                                      Prior Living Arrangements/Services Living arrangements for the past 2 months: [place] Lives with:: Spouse Patient language and need for interpreter reviewed:: Yes Do you feel safe  going back to the place where you live?: Yes      Need for Family Participation in Patient Care: Yes (Comment) Care giver support system in place?: Yes (comment) Current home services: DME Criminal Activity/Legal Involvement Pertinent to Current Situation/Hospitalization: No - Comment as needed  Activities of Daily Living Home Assistive Devices/Equipment: CPAP ADL Screening (condition at time of admission) Patient's cognitive ability adequate to safely complete daily activities?: Yes Is the patient deaf or have difficulty hearing?: Yes Does the patient have difficulty seeing, even when wearing glasses/contacts?: No Does the patient have difficulty concentrating, remembering, or making decisions?: No Patient able to express need for assistance with ADLs?: Yes Does the patient have difficulty dressing or bathing?: Yes Independently performs ADLs?: No Communication: Independent Dressing (OT): Needs assistance Is this a change from baseline?: Pre-admission baseline Grooming: Needs assistance Is this a change from baseline?: Pre-admission baseline Feeding: Independent Bathing: Needs assistance Is this a change from baseline?: Pre-admission baseline Toileting: Needs assistance Is this a change from baseline?: Pre-admission baseline In/Out Bed: Needs assistance Is this a change from baseline?: Pre-admission baseline Does the patient have difficulty walking or climbing stairs?: Yes Weakness of Legs: Both Weakness of Arms/Hands: None  Permission Sought/Granted Permission sought to share information with : Facility Art therapist granted to share information with : Yes, Verbal Permission Granted (by spouse Santiago Glad)     Permission granted to share info w  AGENCY: [facility]        Emotional Assessment         Alcohol / Substance Use: Not Applicable Psych Involvement: No (comment)  Admission diagnosis:  Delirium [R41.0] COPD exacerbation ([place]) [J44.1] SIRS  (systemic inflammatory response syndrome) (HCC) [R65.10] Generalized weakness [R53.1] Fever, unspecified fever cause [R50.9] Patient Active Problem List   Diagnosis Date Noted   Hypothyroidism 12/09/2022   Paroxysmal atrial fibrillation ([place]) 12/09/2022   Type 2 diabetes mellitus with peripheral neuropathy ([place]) 12/09/2022   Generalized weakness 12/09/2022   Fall at home, initial encounter 12/09/2022   SIRS (systemic inflammatory response syndrome) ([place]) 12/08/2022   COPD exacerbation ([place]) 12/08/2022   NSTEMI (non-ST elevated myocardial infarction) ([place]) 11/18/2021   Chronic diastolic CHF (congestive heart failure), NYHA class 3 ([place]) 03/21/2021   Aortic atherosclerosis ([place]) 01/27/2021   Lower limb ulcer, calf, right, limited to breakdown of skin ([place]) 01/18/2021   Swelling of limb 01/18/2021   Secondary adrenal insufficiency ([place]) 10/08/2020   Central hypothyroidism 10/08/2020   Lower urinary tract symptoms (LUTS) 05/19/2020   Metastatic adenocarcinoma to bone ([place]) 05/03/2020   Stage IV adenocarcinoma of prostate ([place]) 05/03/2020   Genetic testing 04/07/2020   Family history of lung cancer    Family history of leukemia    Numbness and tingling of foot 03/22/2020   Goals of care, counseling/discussion 02/17/2020   Prostate cancer ([place]) 02/15/2020   Tubular adenoma 11/04/2019   Medicare annual wellness visit, initial 11/04/2019   Dysarthria 07/01/2019   History of CVA (cerebrovascular accident) 07/01/2019   Anticoagulant long-term use 06/22/2019   Left sided lacunar infarction ([place]) 06/18/2019   Hyperlipidemia 05/31/2019   Coronary artery calcification 04/12/2019   Benign essential hypertension 01/15/2019   History of pituitary adenoma 01/15/2019   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[place]) 01/15/2019   Hematuria 10/11/2018   Sinus bradycardia 10/11/2018   Thyroid disease 10/11/2018   Chronic constipation 10/04/2018   Diverticulosis large  intestine w/o perforation or abscess w/o bleeding 10/04/2018   Polyp of descending colon 10/04/2018   OSA (obstructive sleep apnea) 05/15/2018   Paroxysmal atrial fibrillation with rapid ventricular response ([place]) 01/16/2018   Type 2 diabetes mellitus without complication, without long-term current use of insulin ([place]) 11/08/2017   Adrenal insufficiency ([place]) 10/05/2017   Snores 05/09/2017   Health care maintenance 11/06/2016   Mononeuropathy 10/16/2016   Obesity (BMI 35.0-39.9 without comorbidity) 04/03/2016   Tinnitus of both ears 04/03/2016   Spinal stenosis of lumbar region 01/28/2015   Benign prostatic hyperplasia without urinary obstruction 04/07/2013   Lumbar degenerative disc disease 12/23/2012   Acquired hypothyroidism 11/24/2010   Calculus, kidney 11/24/2010   Raised prostate specific antigen 11/24/2010   Hearing loss 11/24/2010   Hyperlipidemia associated with type 2 diabetes mellitus ([place]) 11/24/2010   Testicular hypofunction 11/24/2010   Osteoarthritis of multiple joints 11/24/2010   Rhinitis, allergic 11/24/2010   Skin tag 11/24/2010   PCP:  Rusty Aus, MD [facility]   [id number] [id number]: [phone]: [address] [place]Phone: [phone]: [phone] [facility] [address] [place] [address] [facility] [address]Phone: [phone]: [phone]    Social Determinants of Health (SDOH) Social History: SDOH Screenings   Food Insecurity: No Food Insecurity (  12/09/2022)  Housing: Low Risk  (12/09/2022)  Transportation Needs: No Transportation Needs (12/09/2022)  Utilities: Not At Risk (12/09/2022)  Depression (PHQ2-9): High Risk (04/24/2022)  Tobacco Use: Medium Risk  (11/30/2022)   SDOH Interventions:     Readmission Risk Interventions     No data to display

## 2022-12-10 NOTE — Evaluation (Signed)
Physical Therapy Evaluation Patient Details Name: Andrew Callahan MRN: [id number] DOB: 09-04-1934 Today's Date: 12/10/2022  History of Present Illness  Pt is an 88 y.o. male with medical history significant for CVA, atrial fibrillation, hypothyroidism, DM, peripheral neuropathy, hypertension, dyslipidemia, paroxysmal atrial fibrillation, OSA on CPAP and CVA, who presented to the emergency room with acute onset of generalized weakness and fall. MD assessment includes: SIRS, generalized weakness, fall, and hypokalemia.   Clinical Impression  Pt was pleasant and motivated to participate during the session and put forth good effort throughout. Pt reported a history of balance deficits since his CVA but reported being close to his baseline at this time.  Pt was able to stand with good speed and stability and then ambulate 175 feet with a RW with min verbal cues for amb closer to the RW with upright posture.  Pt ambulated with minimally decreased cadence and some very minor drifting left/right but was generally steady with no overt LOB.  Pt reported a history of two falls in the last 6 months including current fall as well as a second fall occurring when he was reaching down to pick an item up from the ground.  Pt is at risk for future falls and will benefit from HHPT upon discharge to safely address deficits listed in patient problem list for decreased caregiver assistance and eventual return to PLOF.         Recommendations for follow up therapy are one component of a multi-disciplinary discharge planning process, led by the attending physician.  Recommendations may be updated based on patient status, additional functional criteria and insurance authorization.  Follow Up Recommendations Home health PT      Assistance Recommended at Discharge Intermittent Supervision/Assistance  Patient can return home with the following  Help with stairs or ramp for entrance;Assist for transportation;A little help  with walking and/or transfers    Equipment Recommendations None recommended by PT  Recommendations for Other Services       Functional Status Assessment Patient has had a recent decline in their functional status and demonstrates the ability to make significant improvements in function in a reasonable and predictable amount of time.     Precautions / Restrictions Precautions Precautions: Fall Restrictions Weight Bearing Restrictions: No      Mobility  Bed Mobility               General bed mobility comments: NT, pt in recliner    Transfers Overall transfer level: Modified independent Equipment used: Rolling walker (2 wheels)               General transfer comment: Good speed/power with sit to stand with good stability once in standing with the RW    Ambulation/Gait Ambulation/Gait assistance: Supervision Gait Distance (Feet): 175 Feet Assistive device: Rolling walker (2 wheels) Gait Pattern/deviations: Step-through pattern, Decreased step length - right, Decreased step length - left Gait velocity: decreased     General Gait Details: Minimally decreased cadence and bilateral step length but steady with amb with the RW including during start/stops, sharp 90 and 180 deg turns, and navigating tight spaces  Stairs            Wheelchair Mobility    Modified Rankin (Stroke Patients Only)       Balance Overall balance assessment: History of Falls, Needs assistance   Sitting balance-Leahy Scale: Normal     Standing balance support: Bilateral upper extremity supported, During functional activity Standing balance-Leahy Scale: Good  Pertinent Vitals/Pain Pain Assessment Pain Assessment: 0-10 Pain Score: 8  Pain Location: LLE Pain Descriptors / Indicators: Sore Pain Intervention(s): Repositioned, Premedicated before session, Monitored during session    Home Living Family/patient expects to be discharged  to:: Private residence Living Arrangements: Spouse/significant other Available Help at Discharge: Family;Available 24 hours/day Type of Home: House Home Access: Stairs to enter Entrance Stairs-Rails: None Entrance Stairs-Number of Steps: 2   Home Layout: One level Home Equipment: Shower seat - built in;Rollator (4 wheels);Standard Walker Additional Comments: [facility]    Prior Function Prior Level of Function : Independent/Modified Independent             Mobility Comments: Mod Ind amb community distances with a rollator, two falls secondary to LOB in last 6 months ADLs Comments: Ind with ADLs     Hand Dominance        Extremity/Trunk Assessment   Upper Extremity Assessment Upper Extremity Assessment: Overall WFL for tasks assessed    Lower Extremity Assessment Lower Extremity Assessment: Overall WFL for tasks assessed       Communication   Communication: Expressive difficulties  Cognition Arousal/Alertness: Awake/alert Behavior During Therapy: WFL for tasks assessed/performed Overall Cognitive Status: Within Functional Limits for tasks assessed                                          General Comments      Exercises     Assessment/Plan    PT Assessment Patient needs continued PT services  PT Problem List Decreased balance       PT Treatment Interventions DME instruction;Gait training;Stair training;Functional mobility training;Therapeutic activities;Therapeutic exercise;Balance training;Patient/family education    PT Goals (Current goals can be found in the Care Plan section)  Acute Rehab PT Goals Patient Stated Goal: Improved balance PT Goal Formulation: With patient Time For Goal Achievement: 12/23/22 Potential to Achieve Goals: Good    Frequency Min 2X/week     Co-evaluation               AM-PAC PT "6 Clicks" Mobility  Outcome Measure Help needed turning from your back to your side while in a flat  bed without using bedrails?: None Help needed moving from lying on your back to sitting on the side of a flat bed without using bedrails?: None Help needed moving to and from a bed to a chair (including a wheelchair)?: None Help needed standing up from a chair using your arms (e.g., wheelchair or bedside chair)?: None Help needed to walk in hospital room?: A Little Help needed climbing 3-5 steps with a railing? : A Little 6 Click Score: 22    End of Session Equipment Utilized During Treatment: Gait belt Activity Tolerance: Patient tolerated treatment well Patient left: in chair;with call bell/phone within reach;with chair alarm set Nurse Communication: Mobility status PT Visit Diagnosis: History of falling (Z91.81);Muscle weakness (generalized) (M62.81)    Time: 9211-9417 PT Time Calculation (min) (ACUTE ONLY): 32 min   Charges:   PT Evaluation $PT Eval Moderate Complexity: 1 Mod PT Treatments $Gait Training: 8-22 mins      D. Royetta Asal PT, DPT 12/10/22, 2:55 PM

## 2022-12-10 NOTE — Progress Notes (Signed)
PROGRESS NOTE    Andrew Callahan  XWR:[id number] DOB: Jun 10, 1934 DOA: 12/08/2022 PCP: Rusty Aus, MD   Brief Narrative:  HPI per Dr.Jan Mansy on 12/08/22 Andrew Callahan is a 88 y.o. male with medical history significant for CVA, atrial fibrillation, hypothyroidism, hypertension, dyslipidemia, paroxysmal atrial fibrillation, OSA on CPAP and CVA, who presented to the emergency room with acute onset of generalized weakness and fall while he was getting the stools to sit and eat.  He sustained no injuries.  He has been having cough since last and this morning was having mild sore throat.  He was noted to have a fever that was up to 103 before his wife called EMS.  His wife stated that he was mildly confused.  No neck pain or stiffness.  No recent dysuria, oliguria or hematuria or flank pain.  He has overactive bladder.  No diarrhea or melena or bright red bleeding per rectum.  No chest pain or palpitations.   ED Course: Upon presentation to the emergency room, BP was 160/95 with otherwise normal vital signs.  Labs revealed ABG with pH 7.51, pCO2 of 32 and pO2 of 79 with O2 sat of 96.9% and HCO3 25.5.  CBC showed normal hypokalemia 3.2.  Total protein was 6.4.  High sensitive troponin was 56 and later 67.  CBC was within normal except for mild thrombocytopenia of 145.  Lactic acid was 1.6.  Influenza antigens, COVID-19 PCR and RSV PCR came back negative.  UA was unremarkable.  2 blood culture were drawn. EKG as reviewed by me : EKG showed normal sinus rhythm with a rate of 97 with right bundle branch block and left anterior fascicular block (bifascicular block) and LVH with repolarization abnormality. Imaging: Portable chest x-ray showed low lung volumes with bibasal dependent atelectasis..  Abdominal and pelvic CT scan report is pending.   The patient was started on broad-spectrum IV antibiotic therapy.  He will be admitted to a medical telemetry bed for further evaluation and management.   **Interim  History  Cultures have been negative and his fever has broken.  We will de-escalate and monitor off of Abx to see if fever reoccurs.  Fever has not reoccurred with patient continues to have some abdominal discomfort so we will obtain a KUB.  LFTs are slightly abnormal and AST is elevated and bilirubin was elevated so we will get a right upper quadrant ultrasound.  Unclear why he spiked a temperature but could be viral in nature.   Assessment and Plan: * SIRS (systemic inflammatory response syndrome) (HCC) - This is likely the reason of his generalized weakness. - The patient will be admitted to a medical telemetry bed. - We will need to rule out sepsis but cultures have been negative so far. -PCT was 0.28 and will repeat in the AM -Respiratory Virus Panel Negative and Flu and SARS-CoV2 Negative; 20 respiratory virus panel negative as well -U/A Negative  -He was continued on broad-spectrum antibiotics with IV cefepime, IV vancomycin and IV Flagyl but given his unclear etiology of his temperature these were held and is continued he has been afebrile for last 24 hours -We will follow blood cultures and showed NGTD at 2 days -CT Chest/Abd/Pelvis "CT of the chest: No acute abnormality noted. CT of the abdomen and pelvis: No acute abnormality is seen. Cholelithiasis without complicating factors. Scattered sclerotic foci consistent with the previous history of prostate carcinoma." -Check LE Venous Duplex to Rule out DVT and this was negative -Will be hydrated with  IV normal saline at 100 mL/hr reduce the rate to 75 MLS per hour for 12 more hours -Given that he is having abdominal discomfort today we ordered a KUB and it showed "The bowel gas pattern is normal. No radio-opaque calculi or other significant radiographic abnormality are seen."   Hypothyroidism -Continue home Levothyroxine -Check TSH in the AM   Fall at home, initial encounter -This is likely secondary to his generalized weakness  probably associated with his SIRS. -PT evaluation will be obtained and they are recommending home health PT. TOC spoke with the patient's wife and they would like to use the patient's 3 Free respite [facility] at [facility] prior to returning back to [facility]   Type 2 diabetes mellitus with peripheral neuropathy ([place]) -He will be placed on supplemental coverage with NovoLog. -We will continue Glipizide. -CBGs ranging from 111-116  Pancytopenia -Patient's CBC Trend: Recent Labs  Lab 11/30/22 1259 12/08/22 1831 12/08/22 2149 12/09/22 0521 12/10/22 0439  WBC 9.6 8.6 7.4 5.7 3.5*  HGB 13.2 13.6 13.1 12.5* 12.5*  HCT 38.6* 42.9 39.4 38.3* 37.2*  PLT 198 113* 145* 152 140*  -Likely reactive -Continue to Monitor and Trend -Repeat CBC in the AM    Hypokalemia -Patient's K+ went from 3.2 -> 3.3 -> 4.2  -Check Mag Level in the AM -Continue to Monitor and Replete as Necessary -Repeat CBC in the AM    Paroxysmal atrial fibrillation (HCC) -We will continue his Eliquis and as needed Cardizem -Continue monitor on telemetry   Prostate Cancer ([place]) -We will continue his hormonal therapy with Zytiga 500 mg p.o. daily -Sees Dr. Grayland Ormond in the outpatient setting   Elevated Troponin -Mild and likely reactive -Troponin level went from 56 now 67 but he is having no chest pain or symptoms -No Chest Pain    Benign essential hypertension -We will continue home antihypertensives with irbesartan substitution of 150 mg p.o. daily and diltiazem 30 mg p.o. q APRN and amlodipine 2.5 mg p.o daily. -Continue to monitor blood pressures per protocol -Last blood pressure reading was 127/76  Normocytic Anemia -Hgb/Hct Trend: Recent Labs  Lab 11/30/22 1259 12/08/22 1831 12/08/22 2149 12/09/22 0521 12/10/22 0439  HGB 13.2 13.6 13.1 12.5* 12.5*  HCT 38.6* 42.9 39.4 38.3* 37.2*  MCV 87.7 93.5 88.7 89.3 89.4  -Check anemia panel in the a.m. -Continue to monitor for signs and symptoms of bleeding; no overt  bleeding noted -Repeat CBC in a.m.  Hyperbilirubinemia -Patient's T Bili went from 0.8 -> 1.3 -Continue to Monitor and Trend  Hypoalbuminemia -Patient's Albumin Level went from 3.6 -> 3.4 -Continue to Monitor and Trend -Repeat CBC in the AM   Abnormal AST -AST slightly elevated now from 41 is now 59 -Checking right upper quadrant ultrasound and showed "Cholelithiasis. Hepatic steatosis without focal liver lesions."   Thrombocytopenia -Platelet Count Trend: Recent Labs  Lab 11/30/22 1259 12/08/22 1831 12/08/22 2149 12/09/22 0521 12/10/22 0439  PLT 198 113* 145* 152 140*  -Continue to Monitor for S/Sx of Bleeding; No overt bleeding noted -Repeat CBC in the AM   Obesity -Complicates overall prognosis and care -Estimated body mass index is 35.11 kg/m as calculated from the following:   Height as of this encounter: '5\' 10"'$  (1.778 m).   Weight as of this encounter: 111 kg.  -Weight Loss and Dietary Counseling given  DVT prophylaxis:  apixaban (ELIQUIS) tablet 5 mg    Code Status: DNR Family Communication: Discussed with Wife at bedside   Disposition Plan:  Level of care: Telemetry Medical Status is: Inpatient Remains inpatient appropriate because: Continues to have some abdominal discomfort and will observe him another night off of antibiotics to ensure that he does not spike a temperature.  He is a little neutropenic but ANC is above 1500.  Patient continues to have some abdominal discomfort but has no other concerns or close this time and wife thinks he is all been more bloated   Consultants:  None  Procedures:  As delineated as above  Antimicrobials:  Anti-infectives (From admission, onward)    Start     Dose/Rate Route Frequency Ordered Stop   12/09/22 0100  ceFEPIme (MAXIPIME) 2 g in sodium chloride 0.9 % 100 mL IVPB  Status:  Discontinued        2 g 200 mL/hr over 30 Minutes Intravenous Every 8 hours 12/09/22 0006 12/09/22 1840   12/09/22 0015  vancomycin  (VANCOREADY) IVPB 2000 mg/400 mL  Status:  Discontinued        2,000 mg 200 mL/hr over 120 Minutes Intravenous Every 24 hours 12/09/22 0009 12/09/22 1840   12/09/22 0000  ceFEPIme (MAXIPIME) 2 g in sodium chloride 0.9 % 100 mL IVPB  Status:  Discontinued        2 g 200 mL/hr over 30 Minutes Intravenous  Once 12/08/22 2357 12/09/22 0006   12/09/22 0000  metroNIDAZOLE (FLAGYL) IVPB 500 mg  Status:  Discontinued        500 mg 100 mL/hr over 60 Minutes Intravenous Every 12 hours 12/08/22 2357 12/09/22 1840   12/09/22 0000  vancomycin (VANCOCIN) IVPB 1000 mg/200 mL premix  Status:  Discontinued        1,000 mg 200 mL/hr over 60 Minutes Intravenous  Once 12/08/22 2357 12/09/22 0009       Subjective: Seen and examined at bedside and patient was doing okay but does complain of some abdominal discomfort and bloating.  Denies any chest pain lightheadedness or dizziness or shortness of breath.  Denies any diarrhea and states that he had a normal large bowel movement.  Denies any burning or discomfort in his urine.  No other concerns or complaints at this time and family awaiting physical therapy evaluation.  Objective: Vitals:   12/09/22 1700 12/09/22 2033 12/10/22 0230 12/10/22 0817  BP: (!) 143/72 (!) 179/78 (!) 161/77 (!) 158/85  Pulse: (!) 58 68 62 81  Resp: '16 18 18 17  '$ Temp: 98.2 F (36.8 C) 98.9 F (37.2 C) 98.6 F (37 C) 98.4 F (36.9 C)  TempSrc:      SpO2: 93% 98% 98% 94%  Weight:      Height:        Intake/Output Summary (Last 24 hours) at 12/10/2022 1141 Last data filed at 12/10/2022 0300 Gross per 24 hour  Intake 650 ml  Output 100 ml  Net 550 ml   Filed Weights   12/09/22 0000  Weight: 111 kg   Examination: Physical Exam:  Constitutional: WN/WD obese Caucasian male currently no acute distress Respiratory: Diminished to auscultation bilaterally, no wheezing, rales, rhonchi or crackles. Normal respiratory effort and patient is not tachypenic. No accessory muscle  use.  Unlabored breathing Cardiovascular: RRR, no murmurs / rubs / gallops. S1 and S2 auscultated.  Has no real appreciable extremity edema Abdomen: Soft, non-tender, distended secondary to body habitus.  Bowel sounds positive.  GU: Deferred. Musculoskeletal: No clubbing / cyanosis of digits/nails. No joint deformity upper and lower extremities.  Skin: No rashes, lesions, ulcers limited skin evaluation. No  induration; Warm and dry.  Neurologic: CN 2-12 grossly intact with no focal deficits but is extremely hard of hearing.  Romberg sign and cerebellar reflexes not assessed.  Psychiatric: Normal judgment and insight. Alert and oriented x 3. Normal mood and appropriate affect.   Data Reviewed: I have personally reviewed following labs and imaging studies  CBC: Recent Labs  Lab 12/08/22 1831 12/08/22 2149 12/09/22 0521 12/10/22 0439  WBC 8.6 7.4 5.7 3.5*  NEUTROABS  --  4.8  --  1.7  HGB 13.6 13.1 12.5* 12.5*  HCT 42.9 39.4 38.3* 37.2*  MCV 93.5 88.7 89.3 89.4  PLT 113* 145* 152 470*   Basic Metabolic Panel: Recent Labs  Lab 12/08/22 1831 12/08/22 2149 12/09/22 0521 12/10/22 0439  NA 141 139 140 142  K 3.6 3.2* 3.3* 4.2  CL 111 108 107 115*  CO2 21* 23 22 21*  GLUCOSE 76 106* 111* 116*  BUN '18 20 20 13  '$ CREATININE 0.88 0.86 0.97 0.77  CALCIUM 8.5* 8.7* 8.2* 8.0*  MG  --   --   --  2.4  PHOS  --   --   --  2.8   GFR: Estimated Creatinine Clearance: 82.7 mL/min (by C-G formula based on SCr of 0.77 mg/dL). Liver Function Tests: Recent Labs  Lab 12/08/22 2149 12/10/22 0439  AST 41 59*  ALT 19 18  ALKPHOS 53 42  BILITOT 0.8 1.3*  PROT 6.4* 6.1*  ALBUMIN 3.6 3.4*   No results for input(s): "LIPASE", "AMYLASE" in the last 168 hours. No results for input(s): "AMMONIA" in the last 168 hours. Coagulation Profile: Recent Labs  Lab 12/09/22 0521  INR 1.1   Cardiac Enzymes: No results for input(s): "CKTOTAL", "CKMB", "CKMBINDEX", "TROPONINI" in the last 168  hours. BNP (last 3 results) No results for input(s): "PROBNP" in the last 8760 hours. HbA1C: No results for input(s): "HGBA1C" in the last 72 hours. CBG: Recent Labs  Lab 12/08/22 2121 12/09/22 1752  GLUCAP 95 170*   Lipid Profile: No results for input(s): "CHOL", "HDL", "LDLCALC", "TRIG", "CHOLHDL", "LDLDIRECT" in the last 72 hours. Thyroid Function Tests: No results for input(s): "TSH", "T4TOTAL", "FREET4", "T3FREE", "THYROIDAB" in the last 72 hours. Anemia Panel: No results for input(s): "VITAMINB12", "FOLATE", "FERRITIN", "TIBC", "IRON", "RETICCTPCT" in the last 72 hours. Sepsis Labs: Recent Labs  Lab 12/08/22 2149 12/09/22 0521  PROCALCITON  --  0.28  LATICACIDVEN 1.6  --     Recent Results (from the past 240 hour(s))  Resp panel by RT-PCR (RSV, Flu A&B, Covid) Anterior Nasal Swab     Status: None   Collection Time: 12/08/22  9:49 PM   Specimen: Anterior Nasal Swab  Result Value Ref Range Status   SARS Coronavirus 2 by RT PCR NEGATIVE NEGATIVE Final    Comment: (NOTE) SARS-CoV-2 target nucleic acids are NOT DETECTED.  The SARS-CoV-2 RNA is generally detectable in upper respiratory specimens during the acute phase of infection. The lowest concentration of SARS-CoV-2 viral copies this assay can detect is 138 copies/mL. A negative result does not preclude SARS-Cov-2 infection and should not be used as the sole basis for treatment or other patient management decisions. A negative result may occur with  improper specimen collection/handling, submission of specimen other than nasopharyngeal swab, presence of viral mutation(s) within the areas targeted by this assay, and inadequate number of viral copies(<138 copies/mL). A negative result must be combined with clinical observations, patient history, and epidemiological information. The expected result is Negative.  Fact Sheet  for Patients:  EntrepreneurPulse.com.au  Fact Sheet for Healthcare  Providers:  IncredibleEmployment.be  This test is no t yet approved or cleared by the [place] FDA and  has been authorized for detection and/or diagnosis of SARS-CoV-2 by FDA under an Emergency Use Authorization (EUA). This EUA will remain  in effect (meaning this test can be used) for the duration of the COVID-19 declaration under Section 564(b)(1) of the Act, 21 U.S.C.section 360bbb-3(b)(1), unless the authorization is terminated  or revoked sooner.       Influenza A by PCR NEGATIVE NEGATIVE Final   Influenza B by PCR NEGATIVE NEGATIVE Final    Comment: (NOTE) The Xpert Xpress SARS-CoV-2/FLU/RSV plus assay is intended as an aid in the diagnosis of influenza from Nasopharyngeal swab specimens and should not be used as a sole basis for treatment. Nasal washings and aspirates are unacceptable for Xpert Xpress SARS-CoV-2/FLU/RSV testing.  Fact Sheet for Patients: EntrepreneurPulse.com.au  Fact Sheet for Healthcare Providers: IncredibleEmployment.be  This test is not yet approved or cleared by the [place] FDA and has been authorized for detection and/or diagnosis of SARS-CoV-2 by FDA under an Emergency Use Authorization (EUA). This EUA will remain in effect (meaning this test can be used) for the duration of the COVID-19 declaration under Section 564(b)(1) of the Act, 21 U.S.C. section 360bbb-3(b)(1), unless the authorization is terminated or revoked.     Resp Syncytial Virus by PCR NEGATIVE NEGATIVE Final    Comment: (NOTE) Fact Sheet for Patients: EntrepreneurPulse.com.au  Fact Sheet for Healthcare Providers: IncredibleEmployment.be  This test is not yet approved or cleared by the [place] FDA and has been authorized for detection and/or diagnosis of SARS-CoV-2 by FDA under an Emergency Use Authorization (EUA). This EUA will remain in effect (meaning this test can be  used) for the duration of the COVID-19 declaration under Section 564(b)(1) of the Act, 21 U.S.C. section 360bbb-3(b)(1), unless the authorization is terminated or revoked.  Performed at [facility], [address] [place], [place] [address]   Blood culture (routine x 2)     Status: None (Preliminary result)   Collection Time: 12/08/22 10:09 PM   Specimen: BLOOD LEFT ARM  Result Value Ref Range Status   Specimen Description BLOOD LEFT ARM  Final   Special Requests   Final    BOTTLES DRAWN AEROBIC AND ANAEROBIC Blood Culture results may not be optimal due to an excessive volume of blood received in culture bottles   Culture   Final    NO GROWTH 2 DAYS Performed at [facility], [address] [place], [place] [address]    Report Status PENDING  Incomplete  Blood culture (routine x 2)     Status: None (Preliminary result)   Collection Time: 12/08/22 10:09 PM   Specimen: BLOOD RIGHT HAND  Result Value Ref Range Status   Specimen Description BLOOD RIGHT HAND  Final   Special Requests   Final    BOTTLES DRAWN AEROBIC AND ANAEROBIC Blood Culture results may not be optimal due to an inadequate volume of blood received in culture bottles   Culture   Final    NO GROWTH 2 DAYS Performed at [facility], [address] [place], [place] [address]    Report Status PENDING  Incomplete  Respiratory (~20 pathogens) panel by PCR     Status: None   Collection Time: 12/09/22 11:11 AM   Specimen: Respiratory  Result Value Ref Range Status   Adenovirus NOT DETECTED NOT DETECTED  Final   Coronavirus 229E NOT DETECTED NOT DETECTED Final    Comment: (NOTE) The Coronavirus on the Respiratory Panel, DOES NOT test for the novel  Coronavirus (2019 nCoV)    Coronavirus HKU1 NOT DETECTED NOT DETECTED Final   Coronavirus NL63 NOT DETECTED NOT DETECTED Final   Coronavirus OC43 NOT DETECTED NOT DETECTED Final   Metapneumovirus NOT DETECTED NOT DETECTED Final   Rhinovirus  / Enterovirus NOT DETECTED NOT DETECTED Final   Influenza A NOT DETECTED NOT DETECTED Final   Influenza B NOT DETECTED NOT DETECTED Final   Parainfluenza Virus 1 NOT DETECTED NOT DETECTED Final   Parainfluenza Virus 2 NOT DETECTED NOT DETECTED Final   Parainfluenza Virus 3 NOT DETECTED NOT DETECTED Final   Parainfluenza Virus 4 NOT DETECTED NOT DETECTED Final   Respiratory Syncytial Virus NOT DETECTED NOT DETECTED Final   Bordetella pertussis NOT DETECTED NOT DETECTED Final   Bordetella Parapertussis NOT DETECTED NOT DETECTED Final   Chlamydophila pneumoniae NOT DETECTED NOT DETECTED Final   Mycoplasma pneumoniae NOT DETECTED NOT DETECTED Final    Comment: Performed at [facility], [place] [address], [place], [place] [address]    Radiology Studies: US Venous Img Lower Bilateral (DVT)  Result Date: 12/09/2022 CLINICAL DATA:  Fever. EXAM: BILATERAL LOWER EXTREMITY VENOUS DOPPLER ULTRASOUND TECHNIQUE: Gray-scale sonography with graded compression, as well as color Doppler and duplex ultrasound were performed to evaluate the lower extremity deep venous systems from the level of the common femoral vein and including the common femoral, femoral, profunda femoral, popliteal and calf veins including the posterior tibial, peroneal and gastrocnemius veins when visible. The superficial great saphenous vein was also interrogated. Spectral Doppler was utilized to evaluate flow at rest and with distal augmentation maneuvers in the common femoral, femoral and popliteal veins. COMPARISON:  None Available. FINDINGS: RIGHT LOWER EXTREMITY Common Femoral Vein: No evidence of thrombus. Normal compressibility, respiratory phasicity and response to augmentation. Saphenofemoral Junction: No evidence of thrombus. Normal compressibility and flow on color Doppler imaging. Profunda Femoral Vein: No evidence of thrombus. Normal compressibility and flow on color Doppler imaging. Femoral Vein: No evidence of thrombus.  Normal compressibility, respiratory phasicity and response to augmentation. Popliteal Vein: No evidence of thrombus. Normal compressibility, respiratory phasicity and response to augmentation. Calf Veins: No evidence of thrombus. Normal compressibility and flow on color Doppler imaging. Superficial Great Saphenous Vein: No evidence of thrombus. Normal compressibility. Venous Reflux:  None. Other Findings:  None. LEFT LOWER EXTREMITY Common Femoral Vein: No evidence of thrombus. Normal compressibility, respiratory phasicity and response to augmentation. Saphenofemoral Junction: No evidence of thrombus. Normal compressibility and flow on color Doppler imaging. Profunda Femoral Vein: No evidence of thrombus. Normal compressibility and flow on color Doppler imaging. Femoral Vein: No evidence of thrombus. Normal compressibility, respiratory phasicity and response to augmentation. Popliteal Vein: No evidence of thrombus. Normal compressibility, respiratory phasicity and response to augmentation. Calf Veins: No evidence of thrombus. Normal compressibility and flow on color Doppler imaging. Superficial Great Saphenous Vein: No evidence of thrombus. Normal compressibility. Venous Reflux:  None. Other Findings:  None. IMPRESSION: No evidence of deep venous thrombosis in either lower extremity. Electronically Signed   By: Virgina Norfolk M.D.   On: 12/09/2022 20:22   CT CHEST ABDOMEN PELVIS W CONTRAST  Result Date: 12/09/2022 CLINICAL DATA:  Generalized weakness and fevers EXAM: CT CHEST, ABDOMEN, AND PELVIS WITH CONTRAST TECHNIQUE: Multidetector CT imaging of the chest, abdomen and pelvis was performed following the standard protocol during bolus administration  of intravenous contrast. RADIATION DOSE REDUCTION: This exam was performed according to the departmental dose-optimization program which includes automated exposure control, adjustment of the mA and/or kV according to patient size and/or use of iterative  reconstruction technique. CONTRAST:  151m OMNIPAQUE IOHEXOL 300 MG/ML  SOLN COMPARISON:  Chest x-ray from the previous day. FINDINGS: CT CHEST FINDINGS Cardiovascular: Thoracic aorta demonstrates atherosclerotic calcifications. No aneurysmal dilatation or dissection is noted. Pulmonary artery as visualized is within normal limits although not timed for embolus evaluation. Heart is at the upper limits of normal in size. No significant coronary calcifications are noted. Mediastinum/Nodes: No enlarged mediastinal, hilar, or axillary lymph nodes. Thyroid gland, trachea, and esophagus demonstrate no significant findings. Lungs/Pleura: Lungs are well aerated bilaterally. Minimal basilar atelectasis/scarring is noted. No focal infiltrate or effusion is seen. Musculoskeletal: Degenerative changes of the thoracic spine are noted. No acute rib abnormality is seen. Scattered sclerotic foci are noted within the bony structures consistent with the given clinical history of prostate carcinoma. CT ABDOMEN PELVIS FINDINGS Hepatobiliary: Gallbladder is well distended with dependent cholelithiasis. No complicating factors are seen. The liver is within normal limits. Pancreas: Unremarkable. No pancreatic ductal dilatation or surrounding inflammatory changes. Spleen: Normal in size without focal abnormality. Adrenals/Urinary Tract: Adrenal glands are within normal limits. Kidneys demonstrate a normal enhancement pattern bilaterally. No renal calculi or obstructive changes are seen. The bladder is partially distended. Stomach/Bowel: No obstructive or inflammatory changes of the colon are noted. The appendix is within normal limits. Small bowel and stomach are unremarkable. Vascular/Lymphatic: Aortic atherosclerosis. No enlarged abdominal or pelvic lymph nodes. Reproductive: Prostate has been removed. Other: No abdominal wall hernia or abnormality. No abdominopelvic ascites. Musculoskeletal: Scattered sclerotic foci are noted consistent  with prior prostate carcinoma. Postsurgical changes are noted from L2 to L5. IMPRESSION: CT of the chest: No acute abnormality noted. CT of the abdomen and pelvis: No acute abnormality is seen. Cholelithiasis without complicating factors. Scattered sclerotic foci consistent with the previous history of prostate carcinoma. Electronically Signed   By: MInez CatalinaM.D.   On: 12/09/2022 02:53   DG Chest Portable 1 View  Result Date: 12/08/2022 CLINICAL DATA:  Weakness.  Altered mental status. EXAM: PORTABLE CHEST 1 VIEW COMPARISON:  Chest radiograph dated November 18, 2021 FINDINGS: The heart is normal in size. Atherosclerotic calcification of the aortic arch. Low lung volumes. Bibasilar dependent atelectasis. Thoracic spondylosis. IMPRESSION: Low lung volumes with bibasilar dependent atelectasis. Electronically Signed   By: IKeane PoliceD.O.   On: 12/08/2022 22:10   CT HEAD WO CONTRAST (5MM)  Result Date: 12/08/2022 CLINICAL DATA:  Recent fall with altered mental status, initial encounter EXAM: CT HEAD WITHOUT CONTRAST TECHNIQUE: Contiguous axial images were obtained from the base of the skull through the vertex without intravenous contrast. RADIATION DOSE REDUCTION: This exam was performed according to the departmental dose-optimization program which includes automated exposure control, adjustment of the mA and/or kV according to patient size and/or use of iterative reconstruction technique. COMPARISON:  05/27/2021 MRI FINDINGS: Brain: No evidence of acute infarction, hemorrhage, hydrocephalus, extra-axial collection or mass lesion/mass effect. Mild atrophic and chronic white matter ischemic changes are seen. Basal ganglia calcifications are noted. Vascular: No hyperdense vessel or unexpected calcification. Skull: Normal. Negative for fracture or focal lesion. Sinuses/Orbits: Orbits and their contents are within normal limits. Paranasal sinuses are unremarkable. Minimal fluid in the right mastoid air cells  is seen. Other: None. IMPRESSION: Chronic atrophic and ischemic changes without acute abnormality. Minimal fluid within the right mastoid  air cells. Electronically Signed   By: Inez Catalina M.D.   On: 12/08/2022 19:23    Scheduled Meds:  abiraterone acetate  500 mg Oral UD   acidophilus  1 capsule Oral Daily   amLODipine  2.5 mg Oral Daily   apixaban  5 mg Oral BID   glipiZIDE  5 mg Oral Q breakfast   hydrocortisone  10 mg Oral BID WC   irbesartan  150 mg Oral Daily   levothyroxine  50 mcg Oral Q0600   multivitamin with minerals  1 tablet Oral Daily   polyethylene glycol  17 g Oral Daily   pregabalin  25 mg Oral BID   Continuous Infusions:  sodium chloride 100 mL/hr at 12/10/22 0300    LOS: 2 days   Raiford Noble, DO [facility] Available via Epic secure chat 7am-7pm After these hours, please refer to coverage provider listed on [email or web address] 12/10/2022, 11:41 AM

## 2022-12-10 NOTE — NC FL2 (Signed)
[place] LEVEL OF CARE FORM     IDENTIFICATION  Patient Name: Andrew Callahan Birthdate: Nov 19, 1934 Sex: male Admission Date (Current Location): 12/08/2022  [facility] and [place] Number:  Engineering geologist and Address:  [facility], [address], [place], [place] [address]      Provider Number: [id number]  Attending Physician Name and Address:  Kerney Elbe, DO  Relative Name and Phone Number:  Sagett,[name] (Spouse) [phone] (Mobile)    Current Level of Care: Hospital Recommended Level of Care: [place], Memory Care (respite) Prior Approval Number:    Date Approved/Denied:   PASRR Number: [id number]  Discharge Plan:      Current Diagnoses: Patient Active Problem List   Diagnosis Date Noted   Hypothyroidism 12/09/2022   Paroxysmal atrial fibrillation ([place]) 12/09/2022   Type 2 diabetes mellitus with peripheral neuropathy ([place]) 12/09/2022   Generalized weakness 12/09/2022   Fall at home, initial encounter 12/09/2022   SIRS (systemic inflammatory response syndrome) ([place]) 12/08/2022   COPD exacerbation ([place]) 12/08/2022   NSTEMI (non-ST elevated myocardial infarction) ([place]) 11/18/2021   Chronic diastolic CHF (congestive heart failure), NYHA class 3 ([place]) 03/21/2021   Aortic atherosclerosis ([place]) 01/27/2021   Lower limb ulcer, calf, right, limited to breakdown of skin ([place]) 01/18/2021   Swelling of limb 01/18/2021   Secondary adrenal insufficiency ([place]) 10/08/2020   Central hypothyroidism 10/08/2020   Lower urinary tract symptoms (LUTS) 05/19/2020   Metastatic adenocarcinoma to bone ([place]) 05/03/2020   Stage IV adenocarcinoma of prostate ([place]) 05/03/2020   Genetic testing 04/07/2020   Family history of lung cancer    Family history of leukemia    Numbness and tingling of foot 03/22/2020   Goals of care, counseling/discussion 02/17/2020   Prostate cancer ([place]) 02/15/2020   Tubular adenoma 11/04/2019    Medicare annual wellness visit, initial 11/04/2019   Dysarthria 07/01/2019   History of CVA (cerebrovascular accident) 07/01/2019   Anticoagulant long-term use 06/22/2019   Left sided lacunar infarction ([place]) 06/18/2019   Hyperlipidemia 05/31/2019   Coronary artery calcification 04/12/2019   Benign essential hypertension 01/15/2019   History of pituitary adenoma 01/15/2019   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[place]) 01/15/2019   Hematuria 10/11/2018   Sinus bradycardia 10/11/2018   Thyroid disease 10/11/2018   Chronic constipation 10/04/2018   Diverticulosis large intestine w/o perforation or abscess w/o bleeding 10/04/2018   Polyp of descending colon 10/04/2018   OSA (obstructive sleep apnea) 05/15/2018   Paroxysmal atrial fibrillation with rapid ventricular response ([place]) 01/16/2018   Type 2 diabetes mellitus without complication, without long-term current use of insulin ([place]) 11/08/2017   Adrenal insufficiency ([place]) 10/05/2017   Snores 05/09/2017   Health care maintenance 11/06/2016   Mononeuropathy 10/16/2016   Obesity (BMI 35.0-39.9 without comorbidity) 04/03/2016   Tinnitus of both ears 04/03/2016   Spinal stenosis of lumbar region 01/28/2015   Benign prostatic hyperplasia without urinary obstruction 04/07/2013   Lumbar degenerative disc disease 12/23/2012   Acquired hypothyroidism 11/24/2010   Calculus, kidney 11/24/2010   Raised prostate specific antigen 11/24/2010   Hearing loss 11/24/2010   Hyperlipidemia associated with type 2 diabetes mellitus ([place]) 11/24/2010   Testicular hypofunction 11/24/2010   Osteoarthritis of multiple joints 11/24/2010   Rhinitis, allergic 11/24/2010   Skin tag 11/24/2010    Orientation RESPIRATION BLADDER Height & Weight     Self, Time, Situation, Place  Normal External catheter, Incontinent Weight: 244 lb 11.4 oz (111 kg) (Taken from O/V on 12/06/22) Height:  $'5\' 10"'d$  (177.8 cm)  BEHAVIORAL SYMPTOMS/MOOD  NEUROLOGICAL BOWEL NUTRITION STATUS        Diet  AMBULATORY STATUS COMMUNICATION OF NEEDS Skin   Supervision Verbally Bruising (right hip)                       Personal Care Assistance Level of Assistance  Bathing, Feeding, Dressing Bathing Assistance: Limited assistance Feeding assistance: Independent Dressing Assistance: Limited assistance     Functional Limitations Info             SPECIAL CARE FACTORS FREQUENCY  PT (By licensed PT), OT (By licensed OT)     PT Frequency: HH OT Frequency: HH            Contractures      Additional Factors Info  Code Status, Allergies Code Status Info: DNR Allergies Info: Allergies: Metoprolol, Rivaroxaban           Current Medications (12/10/2022):  This is the current hospital active medication list Current Facility-Administered Medications  Medication Dose Route Frequency Provider Last Rate Last Admin   0.9 %  sodium chloride infusion   Intravenous Continuous Mansy, Jan A, MD 100 mL/hr at 12/10/22 1603 New Bag at 12/10/22 1603   abiraterone acetate (ZYTIGA) tablet 500 mg  500 mg Oral UD Mansy, Jan A, MD       acetaminophen (TYLENOL) tablet 650 mg  650 mg Oral Q6H PRN Mansy, Jan A, MD   650 mg at 12/09/22 0744   Or   acetaminophen (TYLENOL) suppository 650 mg  650 mg Rectal Q6H PRN Mansy, Jan A, MD       acidophilus (RISAQUAD) capsule 1 capsule  1 capsule Oral Daily Mansy, Jan A, MD   1 capsule at 12/10/22 0940   alum & mag hydroxide-simeth (MAALOX/MYLANTA) 200-200-20 MG/5ML suspension 15 mL  15 mL Oral Q6H PRN Raiford Noble Latif, DO       amLODipine (NORVASC) tablet 2.5 mg  2.5 mg Oral Daily Mansy, Jan A, MD   2.5 mg at 12/10/22 0940   apixaban (ELIQUIS) tablet 5 mg  5 mg Oral BID Mansy, Jan A, MD   5 mg at 12/10/22 0940   diltiazem (CARDIZEM) tablet 30 mg  30 mg Oral Daily PRN Mansy, Jan A, MD       glipiZIDE (GLUCOTROL XL) 24 hr tablet 5 mg  5 mg Oral Q breakfast Mansy, Jan A, MD   5 mg at 12/10/22 0943    hydrocortisone (CORTEF) tablet 10 mg  10 mg Oral BID WC Mansy, Jan A, MD   10 mg at 12/10/22 0943   irbesartan (AVAPRO) tablet 150 mg  150 mg Oral Daily Mansy, Jan A, MD   150 mg at 12/10/22 0940   levothyroxine (SYNTHROID) tablet 50 mcg  50 mcg Oral Q0600 Mansy, Jan A, MD   50 mcg at 12/10/22 0618   magnesium hydroxide (MILK OF MAGNESIA) suspension 30 mL  30 mL Oral Daily PRN Mansy, Jan A, MD       multivitamin with minerals tablet 1 tablet  1 tablet Oral Daily Mansy, Jan A, MD   1 tablet at 12/10/22 0940   ondansetron (ZOFRAN) tablet 4 mg  4 mg Oral Q6H PRN Mansy, Jan A, MD       Or   ondansetron [facility]) injection 4 mg  4 mg Intravenous Q6H PRN Mansy, Jan A, MD   4 mg at 12/10/22 1000   polyethylene glycol (MIRALAX / GLYCOLAX) packet  17 g  17 g Oral Daily Mansy, Jan A, MD   17 g at 12/09/22 0930   pregabalin (LYRICA) capsule 25 mg  25 mg Oral BID Mansy, Jan A, MD   25 mg at 12/10/22 0940   traZODone (DESYREL) tablet 25 mg  25 mg Oral QHS PRN Mansy, Arvella Merles, MD         Discharge Medications: Please see discharge summary for a list of discharge medications.  Relevant Imaging Results:  Relevant Lab Results:   Additional Information SS #: [id number]  [place], LCSW

## 2022-12-11 DIAGNOSIS — R200 Anesthesia of skin: Secondary | ICD-10-CM

## 2022-12-11 DIAGNOSIS — R6510 Systemic inflammatory response syndrome (SIRS) of non-infectious origin without acute organ dysfunction: Secondary | ICD-10-CM | POA: Diagnosis not present

## 2022-12-11 DIAGNOSIS — R509 Fever, unspecified: Secondary | ICD-10-CM

## 2022-12-11 DIAGNOSIS — R531 Weakness: Secondary | ICD-10-CM | POA: Diagnosis not present

## 2022-12-11 DIAGNOSIS — R202 Paresthesia of skin: Secondary | ICD-10-CM

## 2022-12-11 LAB — COMPREHENSIVE METABOLIC PANEL
ALT: 24 U/L (ref 0–44)
AST: 70 U/L — ABNORMAL HIGH (ref 15–41)
Albumin: 3.2 g/dL — ABNORMAL LOW (ref 3.5–5.0)
Alkaline Phosphatase: 38 U/L (ref 38–126)
Anion gap: 5 (ref 5–15)
BUN: 13 mg/dL (ref 8–23)
CO2: 23 mmol/L (ref 22–32)
Calcium: 7.9 mg/dL — ABNORMAL LOW (ref 8.9–10.3)
Chloride: 116 mmol/L — ABNORMAL HIGH (ref 98–111)
Creatinine, Ser: 0.7 mg/dL (ref 0.61–1.24)
GFR, Estimated: >60 mL/min (ref >60)
Glucose, Bld: 125 mg/dL — ABNORMAL HIGH (ref 70–99)
Potassium: 3.8 mmol/L (ref 3.5–5.1)
Sodium: 144 mmol/L (ref 135–145)
Total Bilirubin: 0.7 mg/dL (ref 0.3–1.2)
Total Protein: 5.7 g/dL — ABNORMAL LOW (ref 6.5–8.1)

## 2022-12-11 LAB — CBC WITH DIFFERENTIAL/PLATELET
Abs Immature Granulocytes: 0 10*3/uL (ref 0.00–0.07)
Basophils Absolute: 0 10*3/uL (ref 0.0–0.1)
Basophils Relative: 1 %
Eosinophils Absolute: 0.1 10*3/uL (ref 0.0–0.5)
Eosinophils Relative: 3 %
HCT: 35.7 % — ABNORMAL LOW (ref 39.0–52.0)
Hemoglobin: 11.5 g/dL — ABNORMAL LOW (ref 13.0–17.0)
Immature Granulocytes: 0 %
Lymphocytes Relative: 40 %
Lymphs Abs: 1.5 10*3/uL (ref 0.7–4.0)
MCH: 29.2 pg (ref 26.0–34.0)
MCHC: 32.2 g/dL (ref 30.0–36.0)
MCV: 90.6 fL (ref 80.0–100.0)
Monocytes Absolute: 0.4 10*3/uL (ref 0.1–1.0)
Monocytes Relative: 10 %
Neutro Abs: 1.7 10*3/uL (ref 1.7–7.7)
Neutrophils Relative %: 46 %
Platelets: 133 10*3/uL — ABNORMAL LOW (ref 150–400)
RBC: 3.94 MIL/uL — ABNORMAL LOW (ref 4.22–5.81)
RDW: 14.6 % (ref 11.5–15.5)
WBC: 3.7 10*3/uL — ABNORMAL LOW (ref 4.0–10.5)
nRBC: 0 % (ref 0.0–0.2)

## 2022-12-11 LAB — PROCALCITONIN: Procalcitonin: <0.1 ng/mL

## 2022-12-11 LAB — PHOSPHORUS: Phosphorus: 2.5 mg/dL (ref 2.5–4.6)

## 2022-12-11 LAB — TSH: TSH: 0.857 u[IU]/mL (ref 0.350–4.500)

## 2022-12-11 LAB — MAGNESIUM: Magnesium: 2.5 mg/dL — ABNORMAL HIGH (ref 1.7–2.4)

## 2022-12-11 MED ORDER — PREGABALIN 25 MG PO CAPS: 25.0000 mg | ORAL_CAPSULE | Freq: Every day | ORAL | 0 refills | Status: DC

## 2022-12-11 NOTE — Hospital Course (Addendum)
Taken from prior notes.  HPI per Dr.Jan Mansy on 12/08/22 Andrew Callahan is a 89 y.o. male with medical history significant for CVA, atrial fibrillation, hypothyroidism, hypertension, dyslipidemia, paroxysmal atrial fibrillation, OSA on CPAP and CVA, who presented to the emergency room with acute onset of generalized weakness and fall while he was getting the stools to sit and eat.  He sustained no injuries.  He has been having cough since last and this morning was having mild sore throat.  He was noted to have a fever that was up to 103 before his wife called EMS.  His wife stated that he was mildly confused.  No neck pain or stiffness.  No recent dysuria, oliguria or hematuria or flank pain.  He has overactive bladder.  No diarrhea or melena or bright red bleeding per rectum.  No chest pain or palpitations.   ED Course: Upon presentation to the emergency room, BP was 160/95 with otherwise normal vital signs.  Labs revealed ABG with pH 7.51, pCO2 of 32 and pO2 of 79 with O2 sat of 96.9% and HCO3 25.5.  CBC showed normal hypokalemia 3.2.  Total protein was 6.4.  High sensitive troponin was 56 and later 67.  CBC was within normal except for mild thrombocytopenia of 145.  Lactic acid was 1.6.  Influenza antigens, COVID-19 PCR and RSV PCR came back negative.  UA was unremarkable.  2 blood culture were drawn. EKG as reviewed by me : EKG showed normal sinus rhythm with a rate of 97 with right bundle branch block and left anterior fascicular block (bifascicular block) and LVH with repolarization abnormality. Imaging: Portable chest x-ray showed low lung volumes with bibasal dependent atelectasis..  Abdominal and pelvic CT scan report is pending.   The patient was started on broad-spectrum IV antibiotic therapy.  He will be admitted to a medical telemetry bed for further evaluation and management.   **Interim History  Cultures have been negative and his fever has broken.  We will de-escalate and monitor off of  Abx to see if fever reoccurs.  Fever has not reoccurred with patient continues to have some abdominal discomfort so we will obtain a KUB.  LFTs are slightly abnormal and AST is elevated and bilirubin was elevated so we will get a right upper quadrant ultrasound.  Unclear why he spiked a temperature but could be viral in nature.  12/11/22: Vital stable with mildly elevated blood pressure at 159/74.  Remained afebrile.  Labs seems stable and procalcitonin has become negative.Cultures remain negative.  KUB with no acute abnormality.  RUQ ultrasound with cholelithiasis and hepatic steatosis. PT is recommending SNF.  Patient remained stable.  No new pain except chronic lower back pain which is being managed by physiatrist as outpatient. Patient was on Lyrica which is being tapered recently, per wife currently he started taking just once daily dose, dose was adjusted to once daily and he need to have a close follow-up by his providers for further management.  Patient is being discharged to rehab for further management.  Patient will continue on current medications and need to have a close follow-up with his providers.

## 2022-12-11 NOTE — Progress Notes (Signed)
I attempted to call report to the facility. Left message. Patient is being transported to facility via wife, Santiago Glad.

## 2022-12-11 NOTE — Discharge Summary (Signed)
Physician Discharge Summary   Patient: Andrew Callahan MRN: [id number] DOB: 1934/03/24  Admit date:     12/08/2022  Discharge date: 12/11/22  Discharge Physician: Andrew Callahan   PCP: Andrew Aus, MD   Recommendations at discharge:  Please obtain CBC and BMP in 1 week Follow-up with primary care provider within a week  Discharge Diagnoses: Principal Problem:   SIRS (systemic inflammatory response syndrome) ([place]) Active Problems:   Hypothyroidism   Benign essential hypertension   Prostate cancer (HCC)   COPD exacerbation (HCC)   Paroxysmal atrial fibrillation ([place])   Type 2 diabetes mellitus with peripheral neuropathy ([place])   Generalized weakness   Fall at home, initial encounter   Fever   Hospital Course: Taken from prior notes.  HPI per Dr.Jan Callahan on 12/08/22 Andrew Callahan is a 89 y.o. male with medical history significant for CVA, atrial fibrillation, hypothyroidism, hypertension, dyslipidemia, paroxysmal atrial fibrillation, OSA on CPAP and CVA, who presented to the emergency room with acute onset of generalized weakness and fall while he was getting the stools to sit and eat.  He sustained no injuries.  He has been having cough since last and this morning was having mild sore throat.  He was noted to have a fever that was up to 103 before his wife called EMS.  His wife stated that he was mildly confused.  No neck pain or stiffness.  No recent dysuria, oliguria or hematuria or flank pain.  He has overactive bladder.  No diarrhea or melena or bright red bleeding per rectum.  No chest pain or palpitations.   ED Course: Upon presentation to the emergency room, BP was 160/95 with otherwise normal vital signs.  Labs revealed ABG with pH 7.51, pCO2 of 32 and pO2 of 79 with O2 sat of 96.9% and HCO3 25.5.  CBC showed normal hypokalemia 3.2.  Total protein was 6.4.  High sensitive troponin was 56 and later 67.  CBC was within normal except for mild thrombocytopenia of 145.  Lactic acid  was 1.6.  Influenza antigens, COVID-19 PCR and RSV PCR came back negative.  UA was unremarkable.  2 blood culture were drawn. EKG as reviewed by me : EKG showed normal sinus rhythm with a rate of 97 with right bundle branch block and left anterior fascicular block (bifascicular block) and LVH with repolarization abnormality. Imaging: Portable chest x-ray showed low lung volumes with bibasal dependent atelectasis..  Abdominal and pelvic CT scan report is pending.   The patient was started on broad-spectrum IV antibiotic therapy.  He will be admitted to a medical telemetry bed for further evaluation and management.   **Interim History  Cultures have been negative and his fever has broken.  We will de-escalate and monitor off of Abx to see if fever reoccurs.  Fever has not reoccurred with patient continues to have some abdominal discomfort so we will obtain a KUB.  LFTs are slightly abnormal and AST is elevated and bilirubin was elevated so we will get a right upper quadrant ultrasound.  Unclear why he spiked a temperature but could be viral in nature.  12/11/22: Vital stable with mildly elevated blood pressure at 159/74.  Remained afebrile.  Labs seems stable and procalcitonin has become negative.Cultures remain negative.  KUB with no acute abnormality.  RUQ ultrasound with cholelithiasis and hepatic steatosis. PT is recommending SNF.  Patient remained stable.  No new pain except chronic lower back pain which is being managed by physiatrist as outpatient. Patient was on Lyrica  which is being tapered recently, per wife currently he started taking just once daily dose, dose was adjusted to once daily and he need to have a close follow-up by his providers for further management.  Patient is being discharged to rehab for further management.  Patient will continue on current medications and need to have a close follow-up with his providers.        Assessment and Plan: * SIRS (systemic inflammatory  response syndrome) (HCC) - This is likely the reason of his generalized weakness. - The patient will be admitted to a medical telemetry bed. - We will need to rule out sepsis. Received broad-spectrum antibiotics initially which was later discontinued.  Procalcitonin normalized and no more fever.  Hypothyroidism - We will continue his Synthroid.  Fall at home, initial encounter - This is likely secondary to his generalized weakness probably associated with his SIRS. - PT evaluation will be obtained.  Type 2 diabetes mellitus with peripheral neuropathy (HCC) - He will be placed on supplemental coverage with NovoLog. - We will continue glipizide.  Paroxysmal atrial fibrillation (HCC) - We will continue his Eliquis and as needed Cardizem  Prostate cancer ([place]) - We will continue his hormonal therapy.  Benign essential hypertension - We will continue her antihypertensives.   Consultants: None Procedures performed: None Disposition: Skilled nursing facility Diet recommendation:  Discharge Diet Orders (From admission, onward)     Start     Ordered   12/11/22 0000  Diet - low sodium heart healthy        12/11/22 1105           Cardiac and Carb modified diet DISCHARGE MEDICATION: Allergies as of 12/11/2022       Reactions   Metoprolol Other (See Comments)   dizziness   Rivaroxaban Other (See Comments)   hematuria        Medication List     TAKE these medications    abiraterone acetate 250 MG tablet Commonly known as: ZYTIGA TAKE 2 TABLETS ('500MG'$ ) BY MOUTH ONE TIME DAILY ON AN EMPTY STOMACH 1 HOUR BEFORE OR 2 HOURS AFTER A MEAL   Align 4 MG Caps Take 4 mg by mouth at bedtime.   amLODipine 2.5 MG tablet Commonly known as: NORVASC Take 2.5 mg by mouth at bedtime.   apixaban 5 MG Tabs tablet Commonly known as: ELIQUIS Take 5 mg by mouth 2 (two) times daily.   calcium carbonate 750 MG chewable tablet Commonly known as: TUMS EX Chew 1 tablet by mouth daily  with lunch.   cetirizine 10 MG tablet Commonly known as: ZYRTEC Take 10 mg by mouth daily as needed for allergies.   diltiazem 30 MG tablet Commonly known as: CARDIZEM Take 30 mg by mouth daily as needed (afib episode).   ELIGARD [place] Inject into the skin. 1 injection every 6 months.   glipiZIDE 5 MG 24 hr tablet Commonly known as: GLUCOTROL XL Take 5 mg by mouth daily with breakfast.   hydrocortisone 10 MG tablet Commonly known as: CORTEF Take 10 mg by mouth 2 (two) times daily.   Iron (Ferrous Sulfate) 325 (65 Fe) MG Tabs Take 325 mg by mouth daily with supper.   levothyroxine 50 MCG tablet Commonly known as: SYNTHROID Take 50 mcg by mouth daily before breakfast.   multivitamin with minerals Tabs tablet Take 1 tablet by mouth daily. One-A-Day Men's 50+   OSTEO BI-FLEX ADV JOINT SHIELD PO Take 1 tablet by mouth 2 (two) times daily.  polyethylene glycol 17 g packet Commonly known as: MIRALAX / GLYCOLAX Take 17 g by mouth daily. W/coffee   pregabalin 25 MG capsule Commonly known as: LYRICA Take 1 capsule (25 mg total) by mouth daily. What changed: when to take this   SUPER B COMPLEX PO Take 1 capsule by mouth daily.   valsartan 160 MG tablet Commonly known as: DIOVAN Take 160 mg by mouth 2 (two) times daily.   Xgeva 120 MG/1.7ML Soln injection Generic drug: denosumab Inject 120 mg into the skin every 3 (three) months.        Contact information for follow-up providers     Andrew Aus, MD. Schedule an appointment as soon as possible for a visit in 1 week(s).   Specialty: Internal Medicine Contact information: [place] [address] [phone]              Contact information for after-discharge care     Destination     [facility] .   Service: Assisted Living Contact information: [place] [phone]                     Discharge Exam: Danley Danker Weights   12/09/22 0000  Weight: 111 kg   General.     In no acute distress. Pulmonary.  Lungs clear bilaterally, normal respiratory effort. CV.  Regular rate and rhythm, no JVD, rub or murmur. Abdomen.  Soft, nontender, nondistended, BS positive. CNS.  Alert and oriented .  No focal neurologic deficit. Extremities.  No edema, no cyanosis, pulses intact and symmetrical. Psychiatry.  Judgment and insight appears normal.   Condition at discharge: stable  The results of significant diagnostics from this hospitalization (including imaging, microbiology, ancillary and laboratory) are listed below for reference.   Imaging Studies: US Abdomen Limited RUQ (LIVER/GB)  Result Date: 12/10/2022 CLINICAL DATA:  Abnormal liver function tests. EXAM: ULTRASOUND ABDOMEN LIMITED RIGHT UPPER QUADRANT COMPARISON:  None Available. FINDINGS: Gallbladder: [place], shadowing echogenic gallstones are seen gallbladder lumen. The largest measures approximately 14.1 mm. There is no evidence of gallbladder wall thickening (2.7 mm). No sonographic Murphy sign noted by sonographer. Common bile duct: Diameter: 4.0 mm Liver: No focal lesion identified. Diffusely increased echogenicity of the liver parenchyma is noted. Portal vein is patent on color Doppler imaging with normal direction of blood flow towards the liver. Other: The study is limited secondary to overlying bowel gas and inability of the patient to take in and hold a deep breath. IMPRESSION: 1. Cholelithiasis. 2. Hepatic steatosis without focal liver lesions. Electronically Signed   By: Virgina Norfolk M.D.   On: 12/10/2022 18:11   DG Abd 1 View  Result Date: 12/10/2022 CLINICAL DATA:  Abdominal distension. EXAM: ABDOMEN - 1 VIEW COMPARISON:  CT of the abdomen and pelvis 12/08/2022 FINDINGS: The bowel gas pattern is normal. No radio-opaque calculi or other significant radiographic abnormality are seen. IMPRESSION: Negative  one view abdomen. Electronically Signed   By: San Morelle M.D.   On: 12/10/2022 12:35   US Venous Img Lower Bilateral (DVT)  Result Date: 12/09/2022 CLINICAL DATA:  Fever. EXAM: BILATERAL LOWER EXTREMITY VENOUS DOPPLER ULTRASOUND TECHNIQUE: Gray-scale sonography with graded compression, as well as color Doppler and duplex ultrasound were performed to evaluate the lower extremity deep venous systems from the level of the common femoral vein and including the common femoral, femoral, profunda femoral, popliteal and calf veins including the  posterior tibial, peroneal and gastrocnemius veins when visible. The superficial great saphenous vein was also interrogated. Spectral Doppler was utilized to evaluate flow at rest and with distal augmentation maneuvers in the common femoral, femoral and popliteal veins. COMPARISON:  None Available. FINDINGS: RIGHT LOWER EXTREMITY Common Femoral Vein: No evidence of thrombus. Normal compressibility, respiratory phasicity and response to augmentation. Saphenofemoral Junction: No evidence of thrombus. Normal compressibility and flow on color Doppler imaging. Profunda Femoral Vein: No evidence of thrombus. Normal compressibility and flow on color Doppler imaging. Femoral Vein: No evidence of thrombus. Normal compressibility, respiratory phasicity and response to augmentation. Popliteal Vein: No evidence of thrombus. Normal compressibility, respiratory phasicity and response to augmentation. Calf Veins: No evidence of thrombus. Normal compressibility and flow on color Doppler imaging. Superficial Great Saphenous Vein: No evidence of thrombus. Normal compressibility. Venous Reflux:  None. Other Findings:  None. LEFT LOWER EXTREMITY Common Femoral Vein: No evidence of thrombus. Normal compressibility, respiratory phasicity and response to augmentation. Saphenofemoral Junction: No evidence of thrombus. Normal compressibility and flow on color Doppler imaging. Profunda Femoral  Vein: No evidence of thrombus. Normal compressibility and flow on color Doppler imaging. Femoral Vein: No evidence of thrombus. Normal compressibility, respiratory phasicity and response to augmentation. Popliteal Vein: No evidence of thrombus. Normal compressibility, respiratory phasicity and response to augmentation. Calf Veins: No evidence of thrombus. Normal compressibility and flow on color Doppler imaging. Superficial Great Saphenous Vein: No evidence of thrombus. Normal compressibility. Venous Reflux:  None. Other Findings:  None. IMPRESSION: No evidence of deep venous thrombosis in either lower extremity. Electronically Signed   By: Virgina Norfolk M.D.   On: 12/09/2022 20:22   CT CHEST ABDOMEN PELVIS W CONTRAST  Result Date: 12/09/2022 CLINICAL DATA:  Generalized weakness and fevers EXAM: CT CHEST, ABDOMEN, AND PELVIS WITH CONTRAST TECHNIQUE: Multidetector CT imaging of the chest, abdomen and pelvis was performed following the standard protocol during bolus administration of intravenous contrast. RADIATION DOSE REDUCTION: This exam was performed according to the departmental dose-optimization program which includes automated exposure control, adjustment of the mA and/or kV according to patient size and/or use of iterative reconstruction technique. CONTRAST:  125m OMNIPAQUE IOHEXOL 300 MG/ML  SOLN COMPARISON:  Chest x-ray from the previous day. FINDINGS: CT CHEST FINDINGS Cardiovascular: Thoracic aorta demonstrates atherosclerotic calcifications. No aneurysmal dilatation or dissection is noted. Pulmonary artery as visualized is within normal limits although not timed for embolus evaluation. Heart is at the upper limits of normal in size. No significant coronary calcifications are noted. Mediastinum/Nodes: No enlarged mediastinal, hilar, or axillary lymph nodes. Thyroid gland, trachea, and esophagus demonstrate no significant findings. Lungs/Pleura: Lungs are well aerated bilaterally. Minimal basilar  atelectasis/scarring is noted. No focal infiltrate or effusion is seen. Musculoskeletal: Degenerative changes of the thoracic spine are noted. No acute rib abnormality is seen. Scattered sclerotic foci are noted within the bony structures consistent with the given clinical history of prostate carcinoma. CT ABDOMEN PELVIS FINDINGS Hepatobiliary: Gallbladder is well distended with dependent cholelithiasis. No complicating factors are seen. The liver is within normal limits. Pancreas: Unremarkable. No pancreatic ductal dilatation or surrounding inflammatory changes. Spleen: Normal in size without focal abnormality. Adrenals/Urinary Tract: Adrenal glands are within normal limits. Kidneys demonstrate a normal enhancement pattern bilaterally. No renal calculi or obstructive changes are seen. The bladder is partially distended. Stomach/Bowel: No obstructive or inflammatory changes of the colon are noted. The appendix is within normal limits. Small bowel and stomach are unremarkable. Vascular/Lymphatic: Aortic atherosclerosis. No enlarged abdominal or pelvic lymph  nodes. Reproductive: Prostate has been removed. Other: No abdominal wall hernia or abnormality. No abdominopelvic ascites. Musculoskeletal: Scattered sclerotic foci are noted consistent with prior prostate carcinoma. Postsurgical changes are noted from L2 to L5. IMPRESSION: CT of the chest: No acute abnormality noted. CT of the abdomen and pelvis: No acute abnormality is seen. Cholelithiasis without complicating factors. Scattered sclerotic foci consistent with the previous history of prostate carcinoma. Electronically Signed   By: Inez Catalina M.D.   On: 12/09/2022 02:53   DG Chest Portable 1 View  Result Date: 12/08/2022 CLINICAL DATA:  Weakness.  Altered mental status. EXAM: PORTABLE CHEST 1 VIEW COMPARISON:  Chest radiograph dated November 18, 2021 FINDINGS: The heart is normal in size. Atherosclerotic calcification of the aortic arch. Low lung volumes.  Bibasilar dependent atelectasis. Thoracic spondylosis. IMPRESSION: Low lung volumes with bibasilar dependent atelectasis. Electronically Signed   By: Keane Police D.O.   On: 12/08/2022 22:10   CT HEAD WO CONTRAST (5MM)  Result Date: 12/08/2022 CLINICAL DATA:  Recent fall with altered mental status, initial encounter EXAM: CT HEAD WITHOUT CONTRAST TECHNIQUE: Contiguous axial images were obtained from the base of the skull through the vertex without intravenous contrast. RADIATION DOSE REDUCTION: This exam was performed according to the departmental dose-optimization program which includes automated exposure control, adjustment of the mA and/or kV according to patient size and/or use of iterative reconstruction technique. COMPARISON:  05/27/2021 MRI FINDINGS: Brain: No evidence of acute infarction, hemorrhage, hydrocephalus, extra-axial collection or mass lesion/mass effect. Mild atrophic and chronic white matter ischemic changes are seen. Basal ganglia calcifications are noted. Vascular: No hyperdense vessel or unexpected calcification. Skull: Normal. Negative for fracture or focal lesion. Sinuses/Orbits: Orbits and their contents are within normal limits. Paranasal sinuses are unremarkable. Minimal fluid in the right mastoid air cells is seen. Other: None. IMPRESSION: Chronic atrophic and ischemic changes without acute abnormality. Minimal fluid within the right mastoid air cells. Electronically Signed   By: Inez Catalina M.D.   On: 12/08/2022 19:23    Microbiology: Results for orders placed or performed during the hospital encounter of 12/08/22  Resp panel by RT-PCR (RSV, Flu A&B, Covid) Anterior Nasal Swab     Status: None   Collection Time: 12/08/22  9:49 PM   Specimen: Anterior Nasal Swab  Result Value Ref Range Status   SARS Coronavirus 2 by RT PCR NEGATIVE NEGATIVE Final    Comment: (NOTE) SARS-CoV-2 target nucleic acids are NOT DETECTED.  The SARS-CoV-2 RNA is generally detectable in upper  respiratory specimens during the acute phase of infection. The lowest concentration of SARS-CoV-2 viral copies this assay can detect is 138 copies/mL. A negative result does not preclude SARS-Cov-2 infection and should not be used as the sole basis for treatment or other patient management decisions. A negative result may occur with  improper specimen collection/handling, submission of specimen other than nasopharyngeal swab, presence of viral mutation(s) within the areas targeted by this assay, and inadequate number of viral copies(<138 copies/mL). A negative result must be combined with clinical observations, patient history, and epidemiological information. The expected result is Negative.  Fact Sheet for Patients:  EntrepreneurPulse.com.au  Fact Sheet for Healthcare Providers:  IncredibleEmployment.be  This test is no t yet approved or cleared by the [place] FDA and  has been authorized for detection and/or diagnosis of SARS-CoV-2 by FDA under an Emergency Use Authorization (EUA). This EUA will remain  in effect (meaning this test can be used) for the duration of the COVID-19 declaration  under Section 564(b)(1) of the Act, 21 U.S.C.section 360bbb-3(b)(1), unless the authorization is terminated  or revoked sooner.       Influenza A by PCR NEGATIVE NEGATIVE Final   Influenza B by PCR NEGATIVE NEGATIVE Final    Comment: (NOTE) The Xpert Xpress SARS-CoV-2/FLU/RSV plus assay is intended as an aid in the diagnosis of influenza from Nasopharyngeal swab specimens and should not be used as a sole basis for treatment. Nasal washings and aspirates are unacceptable for Xpert Xpress SARS-CoV-2/FLU/RSV testing.  Fact Sheet for Patients: EntrepreneurPulse.com.au  Fact Sheet for Healthcare Providers: IncredibleEmployment.be  This test is not yet approved or cleared by the [place] FDA and has been  authorized for detection and/or diagnosis of SARS-CoV-2 by FDA under an Emergency Use Authorization (EUA). This EUA will remain in effect (meaning this test can be used) for the duration of the COVID-19 declaration under Section 564(b)(1) of the Act, 21 U.S.C. section 360bbb-3(b)(1), unless the authorization is terminated or revoked.     Resp Syncytial Virus by PCR NEGATIVE NEGATIVE Final    Comment: (NOTE) Fact Sheet for Patients: EntrepreneurPulse.com.au  Fact Sheet for Healthcare Providers: IncredibleEmployment.be  This test is not yet approved or cleared by the [place] FDA and has been authorized for detection and/or diagnosis of SARS-CoV-2 by FDA under an Emergency Use Authorization (EUA). This EUA will remain in effect (meaning this test can be used) for the duration of the COVID-19 declaration under Section 564(b)(1) of the Act, 21 U.S.C. section 360bbb-3(b)(1), unless the authorization is terminated or revoked.  Performed at [facility], [address] [place], [place] [address]   Blood culture (routine x 2)     Status: None (Preliminary result)   Collection Time: 12/08/22 10:09 PM   Specimen: BLOOD LEFT ARM  Result Value Ref Range Status   Specimen Description BLOOD LEFT ARM  Final   Special Requests   Final    BOTTLES DRAWN AEROBIC AND ANAEROBIC Blood Culture results may not be optimal due to an excessive volume of blood received in culture bottles   Culture   Final    NO GROWTH 3 DAYS Performed at [facility], [address], [place], [place] [address]    Report Status PENDING  Incomplete  Blood culture (routine x 2)     Status: None (Preliminary result)   Collection Time: 12/08/22 10:09 PM   Specimen: BLOOD RIGHT HAND  Result Value Ref Range Status   Specimen Description BLOOD RIGHT HAND  Final   Special Requests   Final    BOTTLES DRAWN AEROBIC AND ANAEROBIC Blood Culture results may not be  optimal due to an inadequate volume of blood received in culture bottles   Culture   Final    NO GROWTH 3 DAYS Performed at [facility], [address] [place], [place] [address]    Report Status PENDING  Incomplete  Respiratory (~20 pathogens) panel by PCR     Status: None   Collection Time: 12/09/22 11:11 AM   Specimen: Respiratory  Result Value Ref Range Status   Adenovirus NOT DETECTED NOT DETECTED Final   Coronavirus 229E NOT DETECTED NOT DETECTED Final    Comment: (NOTE) The Coronavirus on the Respiratory Panel, DOES NOT test for the novel  Coronavirus (2019 nCoV)    Coronavirus HKU1 NOT DETECTED NOT DETECTED Final   Coronavirus NL63 NOT DETECTED NOT DETECTED Final   Coronavirus OC43 NOT DETECTED NOT DETECTED Final   Metapneumovirus NOT DETECTED NOT DETECTED  Final   Rhinovirus / Enterovirus NOT DETECTED NOT DETECTED Final   Influenza A NOT DETECTED NOT DETECTED Final   Influenza B NOT DETECTED NOT DETECTED Final   Parainfluenza Virus 1 NOT DETECTED NOT DETECTED Final   Parainfluenza Virus 2 NOT DETECTED NOT DETECTED Final   Parainfluenza Virus 3 NOT DETECTED NOT DETECTED Final   Parainfluenza Virus 4 NOT DETECTED NOT DETECTED Final   Respiratory Syncytial Virus NOT DETECTED NOT DETECTED Final   Bordetella pertussis NOT DETECTED NOT DETECTED Final   Bordetella Parapertussis NOT DETECTED NOT DETECTED Final   Chlamydophila pneumoniae NOT DETECTED NOT DETECTED Final   Mycoplasma pneumoniae NOT DETECTED NOT DETECTED Final    Comment: Performed at [facility], [place] [address] [place], [place] [address]    Labs: CBC: Recent Labs  Lab 12/08/22 1831 12/08/22 2149 12/09/22 0521 12/10/22 0439 12/11/22 0342  WBC 8.6 7.4 5.7 3.5* 3.7*  NEUTROABS  --  4.8  --  1.7 1.7  HGB 13.6 13.1 12.5* 12.5* 11.5*  HCT 42.9 39.4 38.3* 37.2* 35.7*  MCV 93.5 88.7 89.3 89.4 90.6  PLT 113* 145* 152 140* 485*   Basic Metabolic Panel: Recent Labs  Lab 12/08/22 1831  12/08/22 2149 12/09/22 0521 12/10/22 0439 12/11/22 0342  NA 141 139 140 142 144  K 3.6 3.2* 3.3* 4.2 3.8  CL 111 108 107 115* 116*  CO2 21* 23 22 21* 23  GLUCOSE 76 106* 111* 116* 125*  BUN '18 20 20 13 13  '$ CREATININE 0.88 0.86 0.97 0.77 0.70  CALCIUM 8.5* 8.7* 8.2* 8.0* 7.9*  MG  --   --   --  2.4 2.5*  PHOS  --   --   --  2.8 2.5   Liver Function Tests: Recent Labs  Lab 12/08/22 2149 12/10/22 0439 12/11/22 0342  AST 41 59* 70*  ALT '19 18 24  '$ ALKPHOS 53 42 38  BILITOT 0.8 1.3* 0.7  PROT 6.4* 6.1* 5.7*  ALBUMIN 3.6 3.4* 3.2*   CBG: Recent Labs  Lab 12/08/22 2121 12/09/22 1752  GLUCAP 95 170*    Discharge time spent: greater than 30 minutes.  This record has been created using Systems analyst. Errors have been sought and corrected,but may not always be located. Such creation errors do not reflect on the standard of care.   Signed: Lorella Nimrod, MD [facility] 12/11/2022

## 2022-12-11 NOTE — Care Management Important Message (Signed)
Important Message  Patient Details  Name: Andrew Callahan MRN: [id number] Date of Birth: May 02, 1934   Medicare Important Message Given:  Yes  Reviewed Medicare IM with Virgia Land, spouse, via room phone [phone]).  Copy of Medicare IM sent securely via [place].   Dannette Barbara 12/11/2022, 9:33 AM

## 2022-12-11 NOTE — TOC Progression Note (Signed)
Transition of Care [facility]) - Progression Note    Patient Details  Name: Andrew Callahan MRN: [id number] Date of Birth: Sep 16, 1934  [facility]) CM/SW [place], RN Phone Number: 12/11/2022, 9:59 AM  Clinical Narrative:   The patient will be going to [facility] today for Respite care, Seth Bake at [facility] confirmed, I spoke with his wife karen and she plans to transport him    Expected Discharge Plan: [place] Barriers to Discharge: Continued Medical Work up  Expected Discharge Plan and Services       Living arrangements for the past 2 months: [place]                                       Social Determinants of Health (SDOH) Interventions [place]: No Food Insecurity (12/09/2022)  Housing: Low Risk  (12/09/2022)  Transportation Needs: No Transportation Needs (12/09/2022)  Utilities: Not At Risk (12/09/2022)  Depression (PHQ2-9): High Risk (04/24/2022)  Tobacco Use: Medium Risk (11/30/2022)    Readmission Risk Interventions     No data to display

## 2022-12-13 LAB — CULTURE, BLOOD (ROUTINE X 2)
Culture: NO GROWTH
Culture: NO GROWTH

## 2023-01-09 ENCOUNTER — Ambulatory Visit (INDEPENDENT_AMBULATORY_CARE_PROVIDER_SITE_OTHER): Payer: Medicare Other | Admitting: Dermatology

## 2023-01-09 ENCOUNTER — Encounter: Payer: Self-pay | Admitting: Dermatology

## 2023-01-09 VITALS — BP 133/76 | HR 63

## 2023-01-09 DIAGNOSIS — C44619 Basal cell carcinoma of skin of left upper limb, including shoulder: Secondary | ICD-10-CM

## 2023-01-09 DIAGNOSIS — L309 Dermatitis, unspecified: Secondary | ICD-10-CM | POA: Diagnosis not present

## 2023-01-09 NOTE — Progress Notes (Signed)
   Follow-Up Visit   Subjective  Andrew Callahan is a 89 y.o. male who presents for the following: Skin Cancer (Patient here for treatment of [place] on the left posterior upper arm biopsy proven [facility] 11/09/2022).  Wife with patient   The following portions of the chart were reviewed this encounter and updated as appropriate:   Tobacco  Allergies  Meds  Problems  Med Hx  Surg Hx  Fam Hx      Review of Systems:  No other skin or systemic complaints except as noted in HPI or Assessment and Plan.  Objective  Well appearing patient in no apparent distress; mood and affect are within normal limits.  A focused examination was performed including left posterior upper arm . Relevant physical exam findings are noted in the Assessment and Plan.  left posterior upper arm Pink pearly papule or plaque with arborizing vessels.   bilateral hands Scaly pink plaques and xerosis      Assessment & Plan  Basal cell carcinoma (BCC) of skin of left upper extremity including shoulder left posterior upper arm  Destruction of lesion  Destruction method: electrodesiccation and curettage   Informed consent: discussed and consent obtained   Timeout:  patient name, date of birth, surgical site, and procedure verified Curettage performed in three different directions: Yes   Electrodesiccation performed over the curetted area: Yes   Final wound size (cm):  1.3 Hemostasis achieved with:  pressure, aluminum chloride and electrodesiccation Outcome: patient tolerated procedure well with no complications   Post-procedure details: wound care instructions given    Hand dermatitis bilateral hands  Chronic and persistent condition with duration or expected duration over one year. Condition is symptomatic / bothersome to patient. Not to goal.   Start samples of Amactin lotion apply to rough scaly areas at hands daily  Restart Triamcinolone cream apply to hands twice daily to hands for up to 2 weeks and then  weekends only as needed. Avoid applying to face, groin, and axilla. Use as directed. Long-term use can cause thinning of the skin. At bedtime can follow with Vaseline and put on cotton gloves.  Topical steroids (such as triamcinolone, fluocinolone, fluocinonide, mometasone, clobetasol, halobetasol, betamethasone, hydrocortisone) can cause thinning and lightening of the skin if they are used for too long in the same area. Your physician has selected the right strength medicine for your problem and area affected on the body. Please use your medication only as directed by your physician to prevent side effects.     Return for TBSE and recheck as scheduled May 09, 2023 .  I, Marye Round, CMA, am acting as scribe for Forest Gleason, MD .   Documentation: I have reviewed the above documentation for accuracy and completeness, and I agree with the above.  Forest Gleason, MD

## 2023-01-09 NOTE — Patient Instructions (Addendum)
Gentle Skin Care Guide  1. Bathe no more than once a day.  2. Avoid bathing in hot water  3. Use a mild soap like Dove, Vanicream, Cetaphil, CeraVe. Can use Lever 2000 or Cetaphil antibacterial soap  4. Use soap only where you need it. On most days, use it under your arms, between your legs, and on your feet. Let the water rinse other areas unless visibly dirty.  5. When you get out of the bath/shower, use a towel to gently blot your skin dry, don't rub it.  6. While your skin is still a little damp, apply a moisturizing cream such as Vanicream, CeraVe, Cetaphil, Eucerin, Sarna lotion or plain Vaseline Jelly. For hands apply Neutrogena [place] Hand Cream or Excipial Hand Cream.  7. Reapply moisturizer any time you start to itch or feel dry.  8. Sometimes using free and clear laundry detergents can be helpful. Fabric softener sheets should be avoided. Downy Free & Gentle liquid, or any liquid fabric softener that is free of dyes and perfumes, it acceptable to use  9. If your doctor has given you prescription creams you may apply moisturizers over them        Wound Care Instructions  Cleanse wound gently with soap and water once a day then pat dry with clean gauze. Apply a thin coat of Petrolatum (petroleum jelly, "Vaseline") over the wound (unless you have an allergy to this). We recommend that you use a new, sterile tube of Vaseline. Do not pick or remove scabs. Do not remove the yellow or white "healing tissue" from the base of the wound.  Cover the wound with fresh, clean, nonstick gauze and secure with paper tape. You may use Band-Aids in place of gauze and tape if the wound is small enough, but would recommend trimming much of the tape off as there is often too much. Sometimes Band-Aids can irritate the skin.  You should call the office for your biopsy report after 1 week if you have not already been contacted.  If you experience any problems, such as abnormal amounts of  bleeding, swelling, significant bruising, significant pain, or evidence of infection, please call the office immediately.  FOR ADULT SURGERY PATIENTS: If you need something for pain relief you may take 1 extra strength Tylenol (acetaminophen) AND 2 Ibuprofen ('200mg'$  each) together every 4 hours as needed for pain. (do not take these if you are allergic to them or if you have a reason you should not take them.) Typically, you may only need pain medication for 1 to 3 days.        Due to recent changes in healthcare laws, you may see results of your pathology and/or laboratory studies on MyChart before the doctors have had a chance to review them. We understand that in some cases there may be results that are confusing or concerning to you. Please understand that not all results are received at the same time and often the doctors may need to interpret multiple results in order to provide you with the best plan of care or course of treatment. Therefore, we ask that you please give [place] 2 business days to thoroughly review all your results before contacting the office for clarification. Should we see a critical lab result, you will be contacted sooner.   If You Need Anything After Your Visit  If you have any questions or concerns for your doctor, please call our main line at [phone] and press option 4 to reach your doctor's  medical assistant. If no one answers, please leave a voicemail as directed and we will return your call as soon as possible. Messages left after 4 pm will be answered the following business day.   You may also send [place] a message via [place]. We typically respond to MyChart messages within 1-2 business days.  For prescription refills, please ask your pharmacy to contact our office. Our fax number is [phone].  If you have an urgent issue when the clinic is closed that cannot wait until the next business day, you can page your doctor at the number below.    Please note that  while we do our best to be available for urgent issues outside of office hours, we are not available 24/7.   If you have an urgent issue and are unable to reach [place], you may choose to seek medical care at your doctor's office, retail clinic, urgent care center, or emergency room.  If you have a medical emergency, please immediately call 911 or go to the emergency department.  Pager Numbers  - Dr. Nehemiah Massed: [phone]  - Dr. Laurence Ferrari: [phone]  - Dr. Nicole Kindred: [phone]  In the event of inclement weather, please call our main line at [phone] for an update on the status of any delays or closures.  Dermatology Medication Tips: Please keep the boxes that topical medications come in in order to help keep track of the instructions about where and how to use these. Pharmacies typically print the medication instructions only on the boxes and not directly on the medication tubes.   If your medication is too expensive, please contact our office at [phone] option 4 or send [place] a message through [place].   We are unable to tell what your co-pay for medications will be in advance as this is different depending on your insurance coverage. However, we may be able to find a substitute medication at lower cost or fill out paperwork to get insurance to cover a needed medication.   If a prior authorization is required to get your medication covered by your insurance company, please allow [place] 1-2 business days to complete this process.  Drug prices often vary depending on where the prescription is filled and some pharmacies may offer cheaper prices.  The website www.goodrx.com contains coupons for medications through different pharmacies. The prices here do not account for what the cost may be with help from insurance (it may be cheaper with your insurance), but the website can give you the price if you did not use any insurance.  - You can print the associated coupon and take it with your  prescription to the pharmacy.  - You may also stop by our office during regular business hours and pick up a GoodRx coupon card.  - If you need your prescription sent electronically to a different pharmacy, notify our office through [facility] or by phone at [phone] option 4.     Si Usted Necesita Algo Despus de Su Visita  Tambin puede enviarnos un mensaje a travs de Pharmacist, community. Por lo general respondemos a los mensajes de MyChart en el transcurso de 1 a 2 das hbiles.  Para renovar recetas, por favor pida a su farmacia que se ponga en contacto con nuestra oficina. Harland Dingwall de fax es [place] [phone].  Si tiene un asunto urgente cuando la clnica est cerrada y que no puede esperar hasta el siguiente da hbil, puede llamar/localizar a su doctor(a) al nmero que aparece a continuacin.   Por  favor, tenga en cuenta que aunque hacemos todo lo posible para estar disponibles para asuntos urgentes fuera del horario de [place], no estamos disponibles las 24 horas del da, los 7 das de la [place].   Si tiene un problema urgente y no puede comunicarse con nosotros, puede optar por buscar atencin mdica  en el consultorio de su doctor(a), en una clnica privada, en un centro de atencin urgente o en una sala de emergencias.  Si tiene Engineering geologist, por favor llame inmediatamente al 911 o vaya a la sala de emergencias.  Nmeros de bper  - Dr. Nehemiah Massed: [phone]  - Dra. Moye: [phone]  - Dra. Nicole Kindred: [phone]  En caso de inclemencias del [place], por favor llame a Johnsie Kindred principal al [phone] para una actualizacin sobre el [place] de cualquier retraso o cierre.  Consejos para la medicacin en dermatologa: Por favor, guarde las cajas en las que vienen los medicamentos de uso tpico para ayudarle a seguir las instrucciones sobre dnde y cmo usarlos. Las farmacias generalmente imprimen las instrucciones del medicamento slo en las cajas y no  directamente en los tubos del [place].   Si su medicamento es muy caro, por favor, pngase en contacto con Zigmund Daniel llamando al [phone] y presione la opcin 4 o envenos un mensaje a travs de Pharmacist, community.   No podemos decirle cul ser su copago por los medicamentos por adelantado ya que esto es diferente dependiendo de la cobertura de su seguro. Sin embargo, es posible que podamos encontrar un medicamento sustituto a Electrical engineer un formulario para que el seguro cubra el medicamento que se considera necesario.   Si se requiere una autorizacin previa para que su compaa de seguros [place] su medicamento, por favor permtanos de 1 a 2 das hbiles para completar este proceso.  Los precios de los medicamentos varan con frecuencia dependiendo del Environmental consultant de dnde se surte la receta y alguna farmacias pueden ofrecer precios ms baratos.  El sitio web www.goodrx.com tiene cupones para medicamentos de Airline pilot. Los precios aqu no tienen en cuenta lo que podra costar con la ayuda del seguro (puede ser ms barato con su seguro), pero el sitio web puede darle el precio si no utiliz Research scientist (physical sciences).  - Puede imprimir el cupn correspondiente y llevarlo con su receta a la farmacia.  - Tambin puede pasar por nuestra oficina durante el horario de atencin regular y Charity fundraiser una tarjeta de cupones de GoodRx.  - Si necesita que su receta se enve electrnicamente a una farmacia diferente, informe a nuestra oficina a travs de MyChart de [place] o por telfono llamando al [phone] y presione la opcin 4.

## 2023-03-01 ENCOUNTER — Encounter: Payer: Self-pay | Admitting: Oncology

## 2023-03-01 ENCOUNTER — Inpatient Hospital Stay: Payer: Medicare Other | Attending: Oncology

## 2023-03-01 ENCOUNTER — Inpatient Hospital Stay (HOSPITAL_BASED_OUTPATIENT_CLINIC_OR_DEPARTMENT_OTHER): Payer: Medicare Other | Admitting: Oncology

## 2023-03-01 ENCOUNTER — Inpatient Hospital Stay: Payer: Medicare Other

## 2023-03-01 VITALS — BP 119/71 | HR 58 | Temp 98.0°F | Resp 18 | Ht 70.0 in | Wt 238.0 lb

## 2023-03-01 DIAGNOSIS — D509 Iron deficiency anemia, unspecified: Secondary | ICD-10-CM

## 2023-03-01 DIAGNOSIS — C61 Malignant neoplasm of prostate: Secondary | ICD-10-CM

## 2023-03-01 DIAGNOSIS — C7951 Secondary malignant neoplasm of bone: Secondary | ICD-10-CM | POA: Insufficient documentation

## 2023-03-01 LAB — COMPREHENSIVE METABOLIC PANEL
ALT: 13 U/L (ref 0–44)
AST: 28 U/L (ref 15–41)
Albumin: 3.9 g/dL (ref 3.5–5.0)
Alkaline Phosphatase: 55 U/L (ref 38–126)
Anion gap: 5 (ref 5–15)
BUN: 17 mg/dL (ref 8–23)
CO2: 21 mmol/L — ABNORMAL LOW (ref 22–32)
Calcium: 8.4 mg/dL — ABNORMAL LOW (ref 8.9–10.3)
Chloride: 112 mmol/L — ABNORMAL HIGH (ref 98–111)
Creatinine, Ser: 0.84 mg/dL (ref 0.61–1.24)
GFR, Estimated: >60 mL/min (ref >60)
Glucose, Bld: 117 mg/dL — ABNORMAL HIGH (ref 70–99)
Potassium: 3.8 mmol/L (ref 3.5–5.1)
Sodium: 138 mmol/L (ref 135–145)
Total Bilirubin: 0.4 mg/dL (ref 0.3–1.2)
Total Protein: 6.8 g/dL (ref 6.5–8.1)

## 2023-03-01 LAB — CBC WITH DIFFERENTIAL/PLATELET
Abs Immature Granulocytes: 0.02 10*3/uL (ref 0.00–0.07)
Basophils Absolute: 0 10*3/uL (ref 0.0–0.1)
Basophils Relative: 1 %
Eosinophils Absolute: 0.1 10*3/uL (ref 0.0–0.5)
Eosinophils Relative: 1 %
HCT: 40.1 % (ref 39.0–52.0)
Hemoglobin: 13.2 g/dL (ref 13.0–17.0)
Immature Granulocytes: 0 %
Lymphocytes Relative: 23 %
Lymphs Abs: 1.8 10*3/uL (ref 0.7–4.0)
MCH: 29.5 pg (ref 26.0–34.0)
MCHC: 32.9 g/dL (ref 30.0–36.0)
MCV: 89.5 fL (ref 80.0–100.0)
Monocytes Absolute: 0.5 10*3/uL (ref 0.1–1.0)
Monocytes Relative: 7 %
Neutro Abs: 5.3 10*3/uL (ref 1.7–7.7)
Neutrophils Relative %: 68 %
Platelets: 206 10*3/uL (ref 150–400)
RBC: 4.48 MIL/uL (ref 4.22–5.81)
RDW: 14.1 % (ref 11.5–15.5)
WBC: 7.8 10*3/uL (ref 4.0–10.5)
nRBC: 0 % (ref 0.0–0.2)

## 2023-03-01 LAB — PSA: Prostatic Specific Antigen: <0.01 ng/mL (ref 0.00–4.00)

## 2023-03-01 LAB — FERRITIN: Ferritin: 24 ng/mL (ref 24–336)

## 2023-03-01 MED ORDER — DENOSUMAB 120 MG/1.7ML ~~LOC~~ SOLN
120.0000 mg | Freq: Once | SUBCUTANEOUS | Status: AC
  Administered 2023-03-01: 120 mg via SUBCUTANEOUS
  Filled 2023-03-01: qty 1.7

## 2023-03-01 NOTE — Progress Notes (Signed)
Ca =8.4, proceed with xgeva today per MD

## 2023-03-01 NOTE — Progress Notes (Signed)
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  INTERVAL HISTORY: Patient returns to clinic today for repeat laboratory work, further evaluation, and continuation of [name].  He continues to have chronic weakness and fatigue, but otherwise feels well.  He continues to have insomnia secondary to chronic nocturia.  He has no neurologic complaints.  He denies any recent fevers or illnesses.  He has a good appetite.  He has no chest pain, shortness of breath, cough, or hemoptysis.  He denies any nausea, vomiting, constipation, or diarrhea.  He has no other urologic complaints.  Patient offers no further specific complaints today.  REVIEW OF SYSTEMS:   Review of Systems  Constitutional:  Positive for malaise/fatigue. Negative for fever and weight loss.  Respiratory: Negative.  Negative for cough and hemoptysis.   Cardiovascular: Negative.  Negative for chest pain and leg swelling.  Gastrointestinal: Negative.  Negative for abdominal pain.  Genitourinary:  Positive for frequency. Negative for hematuria.  Musculoskeletal: Negative.  Negative for back pain.  Skin: Negative.  Negative for rash.  Neurological:  Positive for weakness. Negative for dizziness, sensory change, focal weakness and headaches.  Psychiatric/Behavioral: Negative.  The patient is not nervous/anxious.     As per HPI. Otherwise, a complete review of systems is negative.  PAST MEDICAL HISTORY: Past Medical History:  Diagnosis Date   Acquired hypothyroidism    Aortic atherosclerosis ([place])    Atrial fibrillation ([place])    BCC (basal cell carcinoma) 11/09/2022   left posterior upper arm, EDC  01/09/2023   Chronic constipation    CVA (cerebral vascular accident) ([place])    DDD (degenerative disc disease), cervical    Diabetes mellitus without complication ([place])    Diverticulosis    Elevated prostate specific antigen (PSA)    Family history of leukemia    Family history of lung cancer    History of kidney stones    History of pituitary adenoma    Hyperlipidemia    Hypertension    Hypogonadism in male    Hypothyroid    Hypothyroidism    IDA (iron deficiency anemia)    Lumbar disc disease    Nephrolithiasis    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 Mohs.   Stroke ([place])    throat / slight difficulty swallowing    PAST SURGICAL HISTORY: Past Surgical History:  Procedure Laterality Date   BACK SURGERY  1997   L3, 4, 5 laminectomy    BACK SURGERY  1998   hard wire removed, fusion of L3-5   BACK SURGERY  2009   laminotomy/foraminotomy w/ decompression of nerve root, facet thermal ablation L2-3.    COLONOSCOPY     COLONOSCOPY WITH PROPOFOL N/A 02/04/2021   Procedure: COLONOSCOPY WITH PROPOFOL;  Surgeon: Lesly Rubenstein, MD;  Location: [facility] ENDOSCOPY;  Service: Endoscopy;  Laterality: N/A;  STAT CBC BEFORE PROCEDURE   ESOPHAGOGASTRODUODENOSCOPY (EGD) WITH PROPOFOL N/A 02/04/2021   Procedure: ESOPHAGOGASTRODUODENOSCOPY (EGD) WITH PROPOFOL;  Surgeon: Lesly Rubenstein, MD;  Location:  [facility];  Service: Endoscopy;  Laterality: N/A;   HERNIA REPAIR     HOLEP-LASER ENUCLEATION OF THE PROSTATE WITH MORCELLATION N/A 06/18/2020   Procedure: HOLEP-LASER ENUCLEATION OF THE PROSTATE WITH MORCELLATION;  Surgeon: Billey Co, MD;  Location: [facility] ORS;  Service: Urology;  Laterality: N/A;   JOINT REPLACEMENT     left knee partial   KIDNEY STONE SURGERY  2010   KNEE SURGERY Left 2007    Arthroscopic partial knee replacement, replaced medial side    MOHS SURGERY Right 2014   squamous cell carcinoma   NECK SURGERY  2008   fusion with hardwire in place C3 through C7    pituitary adenoma  1994   benign   PROSTATE BIOPSY N/A 01/01/2020   Procedure: PROSTATE BIOPSY URO NAV FUSION;  Surgeon: Royston Cowper, MD;  Location: [facility] ORS;  Service: Urology;  Laterality: N/A;   [place] SURGERY  2016   fusion L4    TONSILLECTOMY      FAMILY HISTORY: Family History  Problem Relation Age of Onset   Stroke Mother    Heart attack Father    Leukemia Sister 39   Lung cancer Brother 55    ADVANCED DIRECTIVES (Y/N):  N  HEALTH MAINTENANCE: Social History   Tobacco Use   Smoking status: Former    Passive exposure: Past   Smokeless tobacco: Never   Tobacco comments:    quit 50 years ago  Vaping Use   Vaping Use: Never used  Substance Use Topics   Alcohol use: Yes    Comment: rarely   Drug use: Never     Colonoscopy:  PAP:  Bone density:  Lipid panel:  Allergies  Allergen Reactions   Metoprolol Other (See Comments)    dizziness    Rivaroxaban Other (See Comments)    hematuria     Current Outpatient Medications  Medication Sig Dispense Refill   abiraterone acetate (ZYTIGA) 250 MG tablet TAKE 2 TABLETS (500MG ) BY MOUTH ONE TIME DAILY ON AN EMPTY STOMACH 1 HOUR BEFORE OR 2 HOURS AFTER A MEAL 60 tablet 2   amLODipine (NORVASC) 2.5 MG tablet Take 2.5 mg by mouth at bedtime.     apixaban (ELIQUIS) 5 MG TABS tablet Take 5 mg by mouth 2 (two) times daily.     B Complex-C (SUPER B COMPLEX PO) Take 1 capsule by mouth daily.      calcium carbonate (TUMS EX) 750 MG chewable tablet Chew 1 tablet by mouth daily with lunch.     cetirizine (ZYRTEC) 10 MG tablet Take 10 mg by mouth daily as needed for allergies.     denosumab (XGEVA) 120 MG/1.7ML SOLN injection Inject 120 mg into the skin every 3 (three) months.     diltiazem (CARDIZEM) 30 MG tablet Take  30 mg by mouth daily as needed (afib episode).     glipiZIDE (GLUCOTROL XL) 5 MG 24 hr tablet Take 5 mg by mouth daily with breakfast.     hydrocortisone (CORTEF) 10 MG tablet Take 10 mg by mouth 2 (two) times daily.      Iron, Ferrous Sulfate, 325 (65 Fe) MG TABS Take 325 mg by mouth daily with supper.     latanoprost (XALATAN) 0.005 % ophthalmic solution Place 1 drop into both eyes at bedtime.     Leuprolide Acetate (ELIGARD [place]) Inject into the skin. 1 injection every 6 months.     levothyroxine (SYNTHROID) 50 MCG tablet Take 50  mcg by mouth daily before breakfast.     Misc Natural Products (OSTEO BI-FLEX ADV JOINT SHIELD PO) Take 1 tablet by mouth 2 (two) times daily.     Multiple Vitamin (MULTIVITAMIN WITH MINERALS) TABS tablet Take 1 tablet by mouth daily. One-A-Day Men's 50+     polyethylene glycol (MIRALAX / GLYCOLAX) 17 g packet Take 17 g by mouth daily. W/coffee     Probiotic Product (ALIGN) 4 MG CAPS Take 4 mg by mouth at bedtime.     valsartan (DIOVAN) 160 MG tablet Take 160 mg by mouth 2 (two) times daily.     No current facility-administered medications for this visit.    OBJECTIVE: Vitals:   03/01/23 1352  BP: 119/71  Pulse: (!) 58  Resp: 18  Temp: 98 F (36.7 C)  SpO2: 98%      Body mass index is 34.15 kg/m.    ECOG FS:1 - Symptomatic but completely ambulatory  General: Well-developed, well-nourished, no acute distress. Eyes: Pink conjunctiva, anicteric sclera. HEENT: Normocephalic, moist mucous membranes. Lungs: No audible wheezing or coughing. Heart: Regular rate and rhythm. Abdomen: Soft, nontender, no obvious distention. Musculoskeletal: No edema, cyanosis, or clubbing. Neuro: Alert, answering all questions appropriately. Cranial nerves grossly intact. Skin: No rashes or petechiae noted. Psych: Normal affect.  LAB RESULTS:  Lab Results  Component Value Date   NA 138 03/01/2023   K 3.8 03/01/2023   CL 112 (H) 03/01/2023   CO2 21 (L) 03/01/2023    GLUCOSE 117 (H) 03/01/2023   BUN 17 03/01/2023   CREATININE 0.84 03/01/2023   CALCIUM 8.4 (L) 03/01/2023   PROT 6.8 03/01/2023   ALBUMIN 3.9 03/01/2023   AST 28 03/01/2023   ALT 13 03/01/2023   ALKPHOS 55 03/01/2023   BILITOT 0.4 03/01/2023   GFRNONAA >60 03/01/2023   GFRAA >60 09/01/2020    Lab Results  Component Value Date   WBC 7.8 03/01/2023   NEUTROABS 5.3 03/01/2023   HGB 13.2 03/01/2023   HCT 40.1 03/01/2023   MCV 89.5 03/01/2023   PLT 206 03/01/2023     STUDIES: No results found.  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  Patient's PSA was initially greater than 8.0, but trended down and has been undetectable since June 2022.  Today's result is pending.  Patient expressed interest in discontinuing Zytiga.  He will wait for his PSA results tomorrow and if they remain undetectable he will likely finish out the remainder of his prescription over the next month and then discontinue.  If his PSA starts rising again, we will reinstitute 500 mg Zytiga daily.  Continue Xgeva every 3 months and Eligard every 6 months.  Proceed with Xgeva only today.  Return to clinic in 3 months with evaluation by clinical pharmacy and continuation of Xgeva and Eligard.  Patient will then return to clinic in 6 months for continuation of treatment.   Bony metastasis: Continue Xgeva every 3 months as above.  Patient's most recent calcium levels are adequate to proceed as scheduled.  Urinary frequency/hematuria: Chronic and unchanged.  Continue evaluation and treatment per urology.  Anemia: Resolved.  Patient's hemoglobin continues to be within normal limits.  He does not require additional IV Feraheme.  Recent colonoscopy and EGD did not reveal any significant pathology.  He last received 510 mg of IV Feraheme on February 25, 2021. Weakness and fatigue: Chronic and unchanged.   Atrial fibrillation: Continue follow-up with cardiology as indicated.  Patient is now on Eliquis.  Patient expressed understanding and was in agreement with this plan. He also understands that He can call clinic at any time with any questions, concerns, or complaints.    Cancer Staging  Prostate cancer [facility]) Staging form: Prostate, AJCC 8th Edition - Clinical stage from 02/17/2020: Stage IVB (cT2c, cN0, cM1b, PSA: 8.6, Grade Group: 5) - Signed by Andrew Huger, MD on 02/17/2020 Prostate specific antigen (PSA) range: Less than 10 Histologic grading system: 5 grade system   Andrew Huger, MD   03/01/2023 3:09 PM

## 2023-03-09 ENCOUNTER — Other Ambulatory Visit: Payer: Self-pay | Admitting: Pharmacist

## 2023-03-09 DIAGNOSIS — C61 Malignant neoplasm of prostate: Secondary | ICD-10-CM

## 2023-03-09 NOTE — Telephone Encounter (Signed)
Per MD office visit this month, patient plans on finishing out his on hand abiraterone and stopping abiraterone (patient preference)

## 2023-03-22 ENCOUNTER — Telehealth: Payer: Self-pay | Admitting: *Deleted

## 2023-03-22 DIAGNOSIS — N3281 Overactive bladder: Secondary | ICD-10-CM

## 2023-03-22 NOTE — Telephone Encounter (Signed)
Call from the referral coordinator at [facility] clinic, they where calling with an emergency referral for BOTOX at the request of the patient. I explained we do not need another referral because he is an patient here. I have put in referral for [facility] for UDS per Dr. Richardo Hanks. Shanda Bumps spoke with wife and she picked up new patient packet for [facility]. I spoke with wife and she is aware of the process.

## 2023-03-22 NOTE — Telephone Encounter (Signed)
Referral entered  

## 2023-03-22 NOTE — Telephone Encounter (Signed)
Pt wife calling asking for an appt for Botox.  Per Dr Richardo Hanks pt would need to do UDS at [facility] first  .left message to have patient return my call.  Appt canceled for botox discussion in [place].

## 2023-03-22 NOTE — Telephone Encounter (Signed)
Notified patient as instructed, patient is coming to pick up package

## 2023-03-27 ENCOUNTER — Ambulatory Visit: Payer: Medicare Other | Admitting: Urology

## 2023-03-28 ENCOUNTER — Encounter: Payer: Self-pay | Admitting: Student

## 2023-03-28 ENCOUNTER — Ambulatory Visit: Payer: Medicare Other | Admitting: Student

## 2023-03-28 VITALS — BP 166/92 | HR 64 | Temp 97.3°F | Ht 70.0 in | Wt 239.0 lb

## 2023-03-28 DIAGNOSIS — L03031 Cellulitis of right toe: Secondary | ICD-10-CM | POA: Diagnosis not present

## 2023-03-28 DIAGNOSIS — L601 Onycholysis: Secondary | ICD-10-CM | POA: Diagnosis not present

## 2023-03-28 MED ORDER — CEPHALEXIN 500 MG PO CAPS: 500.0000 mg | ORAL_CAPSULE | Freq: Three times a day (TID) | ORAL | 0 refills | Status: AC

## 2023-03-28 NOTE — Progress Notes (Signed)
[facility] clinic [facility].   Provider: Dr. Earnestine Mealing  Code Status: DNR Goals of Care:     03/28/2023    1:50 PM  Advanced Directives  Does Patient Have a Medical Advance Directive? Yes  Type of Estate agent of [place];Out of facility DNR (pink MOST or yellow form);Living will  Does patient want to make changes to medical advance directive? No - Patient declined  Copy of Healthcare Power of Attorney in Chart? Yes - validated most recent copy scanned in chart (See row information)     Chief Complaint  Patient presents with   Acute Visit    Right foot Toenail Missing. Wants to make sure its not infected.     HPI: Patient is a 89 y.o. male seen today for an acute visit for  Denies rashes  Denies any injury Denies nuew medications   Past Medical History:  Diagnosis Date   Acquired hypothyroidism    Aortic atherosclerosis    Atrial fibrillation    BCC (basal cell carcinoma) 11/09/2022   left posterior upper arm, EDC 01/09/2023   Chronic constipation    CVA (cerebral vascular accident)    DDD (degenerative disc disease), cervical    Diabetes mellitus without complication    Diverticulosis    Elevated prostate specific antigen (PSA)    Family history of leukemia    Family history of lung cancer    History of kidney stones    History of pituitary adenoma    Hyperlipidemia    Hypertension    Hypogonadism in male    Hypothyroid    Hypothyroidism    IDA (iron deficiency anemia)    Lumbar disc disease    Nephrolithiasis    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 Mohs.   Stroke    throat / slight difficulty swallowing    Past Surgical History:  Procedure Laterality Date   BACK SURGERY  1997   L3, 4, 5 laminectomy    BACK SURGERY  1998   hard wire removed, fusion  of L3-5   BACK SURGERY  2009   laminotomy/foraminotomy w/ decompression of nerve root, facet thermal ablation L2-3.    COLONOSCOPY     COLONOSCOPY WITH PROPOFOL N/A 02/04/2021   Procedure: COLONOSCOPY WITH PROPOFOL;  Surgeon: Regis Bill, MD;  Location: [facility] ENDOSCOPY;  Service: Endoscopy;  Laterality: N/A;  STAT CBC BEFORE PROCEDURE   ESOPHAGOGASTRODUODENOSCOPY (EGD) WITH PROPOFOL N/A 02/04/2021   Procedure: ESOPHAGOGASTRODUODENOSCOPY (EGD) WITH PROPOFOL;  Surgeon: Regis Bill, MD;  Location: [facility];  Service: Endoscopy;  Laterality: N/A;   HERNIA REPAIR     HOLEP-LASER ENUCLEATION OF THE PROSTATE WITH MORCELLATION N/A 06/18/2020   Procedure: HOLEP-LASER ENUCLEATION OF THE PROSTATE WITH MORCELLATION;  Surgeon: Sondra Come, MD;  Location: [facility] ORS;  Service: Urology;  Laterality: N/A;   JOINT REPLACEMENT     left knee partial   KIDNEY STONE SURGERY  2010   KNEE SURGERY Left 2007   Arthroscopic partial knee replacement, replaced medial side    MOHS SURGERY Right 2014   squamous cell carcinoma   NECK SURGERY  2008   fusion with hardwire in place C3 through C7    pituitary adenoma  1994   benign   PROSTATE BIOPSY N/A 01/01/2020  Procedure: PROSTATE BIOPSY URO NAV FUSION;  Surgeon: Orson Ape, MD;  Location: [facility] ORS;  Service: Urology;  Laterality: N/A;   SPINE SURGERY     SPINE SURGERY  2016   fusion L4    TONSILLECTOMY      Allergies  Allergen Reactions   Metoprolol Other (See Comments)    dizziness    Rivaroxaban Other (See Comments)    hematuria     Outpatient Encounter Medications as of 03/28/2023  Medication Sig   abiraterone acetate (ZYTIGA) 250 MG tablet TAKE 2 TABLETS ( ) BY MOUTH ONE TIME DAILY ON AN EMPTY STOMACH 1 HOUR BEFORE OR 2 HOURS AFTER A MEAL   acetaminophen (TYLENOL) 500 MG tablet Take 500 mg by mouth every 6 (six) hours as needed.   amLODipine (NORVASC) 2.5 MG tablet Take 2.5 mg by mouth at bedtime.   apixaban (ELIQUIS)  5 MG TABS tablet Take 5 mg by mouth 2 (two) times daily.   B Complex-C (SUPER B COMPLEX PO) Take 1 capsule by mouth daily.    cetirizine (ZYRTEC) 10 MG tablet Take 10 mg by mouth daily as needed for allergies.   denosumab (XGEVA) 120 MG/1.7ML SOLN injection Inject 120 mg into the skin every 3 (three) months.   diltiazem (CARDIZEM) 30 MG tablet Take 30 mg by mouth daily as needed (afib episode).   hydrocortisone (CORTEF) 10 MG tablet Take 10 mg by mouth 2 (two) times daily.    Iron, Ferrous Sulfate, 325 (65 Fe) MG TABS Take 325 mg by mouth daily with supper.   latanoprost (XALATAN) 0.005 % ophthalmic solution Place 1 drop into both eyes at bedtime.   Leuprolide Acetate (ELIGARD [place]) Inject into the skin. 1 injection every 6 months.   levothyroxine (SYNTHROID) 50 MCG tablet Take 50 mcg by mouth daily before breakfast.   Misc Natural Products (OSTEO BI-FLEX ADV JOINT SHIELD PO) Take 1 tablet by mouth 2 (two) times daily.   Multiple Vitamin (MULTIVITAMIN WITH MINERALS) TABS tablet Take 1 tablet by mouth daily. One-A-Day Men's 50+   polyethylene glycol (MIRALAX / GLYCOLAX) 17 g packet Take 17 g by mouth daily. W/coffee   Probiotic Product (ALIGN) 4 MG CAPS Take 4 mg by mouth at bedtime.   valsartan (DIOVAN) 160 MG tablet Take 160 mg by mouth 2 (two) times daily.   [DISCONTINUED] calcium carbonate (TUMS EX) 750 MG chewable tablet Chew 1 tablet by mouth daily with lunch.   [DISCONTINUED] glipiZIDE (GLUCOTROL XL) 5 MG 24 hr tablet Take 5 mg by mouth daily with breakfast.   No facility-administered encounter medications on file as of 03/28/2023.    Review of Systems:  Review of Systems  Health Maintenance  Topic Date Due   FOOT EXAM  Never done   OPHTHALMOLOGY EXAM  Never done   Medicare Annual Wellness (AWV)  11/03/2020   DTaP/Tdap/Td (2 - Td or Tdap) 08/31/2021   HEMOGLOBIN A1C  05/19/2022   COVID-19 Vaccine (7 - 2023-24 season) 08/11/2022   INFLUENZA VACCINE  07/12/2023   Pneumonia Vaccine  61+ Years old  Completed   Zoster Vaccines- Shingrix  Completed   HPV VACCINES  Aged Out    Physical Exam: Vitals:   03/28/23 1342 03/28/23 1352  BP: (!) 178/96 (!) 166/92  Pulse: 64   Temp: (!) 97.3 F (36.3 C)   SpO2: 97%   Weight: 239 lb (108.4 kg)   Height:  (1.778 m)    Body mass index is 34.29 kg/m. Physical Exam Skin:  Comments: Right foot 4th tow w/o toenail. Granulation tissue at the matrix of the toenail. Left second toe with apparent bruising underneath the nail.      Labs reviewed: Basic Metabolic Panel: Recent Labs    12/10/22 0439 12/11/22 0342 03/01/23 1327  NA 142 144 138  K 4.2 3.8 3.8  CL 115* 116* 112*  CO2 21* 23 21*  GLUCOSE 116* 125* 117*  BUN CREATININE 0.77 0.70 0.84  CALCIUM 8.0* 7.9* 8.4*  MG 2.4 2.5*  --   PHOS 2.8 2.5  --   TSH  --  0.857  --    Liver Function Tests: Recent Labs    12/10/22 0439 12/11/22 0342 03/01/23 1327  AST 59* 70* 28  ALT ALKPHOS 42 38 55  BILITOT 1.3* 0.7 0.4  PROT 6.1* 5.7* 6.8  ALBUMIN 3.4* 3.2* 3.9   No results for input(s): "LIPASE", "AMYLASE" in the last 8760 hours. No results for input(s): "AMMONIA" in the last 8760 hours. CBC: Recent Labs    12/10/22 0439 12/11/22 0342 03/01/23 1327  WBC 3.5* 3.7* 7.8  NEUTROABS 1.7 1.7 5.3  HGB 12.5* 11.5* 13.2  HCT 37.2* 35.7* 40.1  MCV 89.4 90.6 89.5  PLT 140* 133* 206   Lipid Panel: No results for input(s): "CHOL", "HDL", "LDLCALC", "TRIG", "CHOLHDL", "LDLDIRECT" in the last 8760 hours. Lab Results  Component Value Date   HGBA1C 5.9 (H) 11/18/2021    Procedures since last visit: No results found.  Assessment/Plan Paronychia of toe of right foot - Plan: cephALEXin (KEFLEX) 500 MG capsule  Onycholysis Patient with significant tenderness of the toe. Denies trauma, however, found toenail in the bed. Patient hit his feet multiple times with rollator walker during the visit today. Discussed concern that if the toe  nails do not heal, there could be reason for a biopsy of the nailbed. Patient's pulses difficult to palpate bilaterally and no hair on the legs, given history of NSTEMI and CVA in 2022, some concern for peripheral vascular disease. During f/u with PCP should consider further work up. Encouraged follow up with dermatology as well. Cleaned wound, and applied vaseline and bandage. Given exquisite tenderness, some concern that there was an underlying infection, will give antibiotics for 5 days. F/u prn.    Labs/tests ordered:  * No order type specified * Next appt:  Visit date not found

## 2023-03-28 NOTE — Patient Instructions (Addendum)
Please clean the area daily with soap and water. Apply vaseline to the area and a band aid daily.   Consider epsom salt soaks -- 15 minutes in warm water and unscented epsom salt.   Please continue antibiotics for the next 5 days.

## 2023-04-02 ENCOUNTER — Encounter: Payer: Self-pay | Admitting: Oncology

## 2023-04-10 ENCOUNTER — Encounter: Payer: Self-pay | Admitting: Pharmacist

## 2023-04-10 DIAGNOSIS — C61 Malignant neoplasm of prostate: Secondary | ICD-10-CM

## 2023-04-10 MED ORDER — ABIRATERONE ACETATE 250 MG PO TABS: ORAL_TABLET | ORAL | 2 refills | Status: DC

## 2023-04-10 NOTE — Telephone Encounter (Signed)
Called and spoke with patient in reference to this message. At last office visit, patient and his wife expressed interest in stopping the Zytiga if his PSA remained undetectable, so a refill was not sent in of his Zytiga. Patient's wife is now indicating that they would like to continue the [facility] for now and discuss stopping at his next office visit. She understands that our preference would be to continue to the Zytiga until disease progression or unacceptable drug toxicity, and stopping treatment would be their choice to make.

## 2023-04-10 NOTE — Telephone Encounter (Signed)
Called and spoke with patient in reference to this message. At last office visit, patient and his wife expressed interest in stopping the Zytiga if his PSA remained undetectable, so a refill was not sent in of his Zytiga. Patient's wife is now indicating that they would like to continue the Zytiga for now and discuss stopping at his next office visit. She understands that our preference would be to continue to the Zytiga until disease progression or unacceptable drug toxicity, and stopping treatment would be their choice to make.  

## 2023-05-09 ENCOUNTER — Ambulatory Visit (INDEPENDENT_AMBULATORY_CARE_PROVIDER_SITE_OTHER): Payer: Medicare Other | Admitting: Urology

## 2023-05-09 ENCOUNTER — Encounter: Payer: Self-pay | Admitting: Urology

## 2023-05-09 ENCOUNTER — Ambulatory Visit: Payer: Medicare Other | Admitting: Dermatology

## 2023-05-09 ENCOUNTER — Ambulatory Visit (INDEPENDENT_AMBULATORY_CARE_PROVIDER_SITE_OTHER): Payer: Medicare Other | Admitting: Dermatology

## 2023-05-09 VITALS — BP 121/67 | HR 59

## 2023-05-09 DIAGNOSIS — C7951 Secondary malignant neoplasm of bone: Secondary | ICD-10-CM | POA: Diagnosis not present

## 2023-05-09 DIAGNOSIS — D485 Neoplasm of uncertain behavior of skin: Secondary | ICD-10-CM

## 2023-05-09 DIAGNOSIS — B079 Viral wart, unspecified: Secondary | ICD-10-CM | POA: Diagnosis not present

## 2023-05-09 DIAGNOSIS — L814 Other melanin hyperpigmentation: Secondary | ICD-10-CM

## 2023-05-09 DIAGNOSIS — Z1283 Encounter for screening for malignant neoplasm of skin: Secondary | ICD-10-CM | POA: Diagnosis not present

## 2023-05-09 DIAGNOSIS — C61 Malignant neoplasm of prostate: Secondary | ICD-10-CM | POA: Diagnosis not present

## 2023-05-09 DIAGNOSIS — L578 Other skin changes due to chronic exposure to nonionizing radiation: Secondary | ICD-10-CM

## 2023-05-09 DIAGNOSIS — L821 Other seborrheic keratosis: Secondary | ICD-10-CM | POA: Diagnosis not present

## 2023-05-09 DIAGNOSIS — L820 Inflamed seborrheic keratosis: Secondary | ICD-10-CM

## 2023-05-09 DIAGNOSIS — D225 Melanocytic nevi of trunk: Secondary | ICD-10-CM

## 2023-05-09 DIAGNOSIS — D229 Melanocytic nevi, unspecified: Secondary | ICD-10-CM

## 2023-05-09 DIAGNOSIS — R351 Nocturia: Secondary | ICD-10-CM

## 2023-05-09 DIAGNOSIS — N3281 Overactive bladder: Secondary | ICD-10-CM | POA: Diagnosis not present

## 2023-05-09 DIAGNOSIS — W908XXA Exposure to other nonionizing radiation, initial encounter: Secondary | ICD-10-CM

## 2023-05-09 DIAGNOSIS — Z85828 Personal history of other malignant neoplasm of skin: Secondary | ICD-10-CM

## 2023-05-09 DIAGNOSIS — X32XXXA Exposure to sunlight, initial encounter: Secondary | ICD-10-CM

## 2023-05-09 DIAGNOSIS — D239 Other benign neoplasm of skin, unspecified: Secondary | ICD-10-CM

## 2023-05-09 DIAGNOSIS — L603 Nail dystrophy: Secondary | ICD-10-CM

## 2023-05-09 DIAGNOSIS — D1801 Hemangioma of skin and subcutaneous tissue: Secondary | ICD-10-CM

## 2023-05-09 HISTORY — DX: Other benign neoplasm of skin, unspecified: D23.9

## 2023-05-09 MED ORDER — GEMTESA 75 MG PO TABS
75.0000 mg | ORAL_TABLET | Freq: Every day | ORAL | 0 refills | Status: DC
Start: 2023-05-09 — End: 2023-06-13

## 2023-05-09 NOTE — Progress Notes (Signed)
Follow-Up Visit   Subjective  Andrew Callahan is a 89 y.o. male who presents for the following: Skin Cancer Screening and Full Body Skin Exam  The patient presents for Total-Body Skin Exam (TBSE) for skin cancer screening and mole check. The patient has spots, moles and lesions to be evaluated, some may be new or changing and the patient has concerns that these could be cancer.   The following portions of the chart were reviewed this encounter and updated as appropriate: medications, allergies, medical history  Review of Systems:  No other skin or systemic complaints except as noted in HPI or Assessment and Plan.  Objective  Well appearing patient in no apparent distress; mood and affect are within normal limits.  A full examination was performed including scalp, head, eyes, ears, nose, lips, neck, chest, axillae, abdomen, back, buttocks, bilateral upper extremities, bilateral lower extremities, hands, feet, fingers, toes, fingernails, and toenails. All findings within normal limits unless otherwise noted below.   Relevant physical exam findings are noted in the Assessment and Plan.  Mid chest 0.5 cm irregular dark brown thin papule     L cheek 0.2 cm dark brown irregular papule.      Mid back right of midline 0.6 cm irregular thin brown papule.     L ant neck x 1 Verrucous papules -- Discussed viral etiology and contagion.     Assessment & Plan   LENTIGINES, SEBORRHEIC KERATOSES, HEMANGIOMAS - Benign normal skin lesions - Benign-appearing - Call for any changes  MELANOCYTIC NEVI - Tan-brown and/or pink-flesh-colored symmetric macules and papules - Benign appearing on exam today - Observation - Call clinic for new or changing moles - Recommend daily use of broad spectrum spf 30+ sunscreen to sun-exposed areas.   ACTINIC DAMAGE - Chronic condition, secondary to cumulative UV/sun exposure - diffuse scaly erythematous macules with underlying dyspigmentation -  Recommend daily broad spectrum sunscreen SPF 30+ to sun-exposed areas, reapply every 2 hours as needed.  - Staying in the shade or wearing long sleeves, sun glasses (UVA+UVB protection) and wide brim hats (4-inch brim around the entire circumference of the hat) are also recommended for sun protection.  - Call for new or changing lesions.  HISTORY OF BASAL CELL CARCINOMA OF THE SKIN - No evidence of recurrence today - Recommend regular full body skin exams - Recommend daily broad spectrum sunscreen SPF 30+ to sun-exposed areas, reapply every 2 hours as needed.  - Call if any new or changing lesions are noted between office visits  HISTORY OF SQUAMOUS CELL CARCINOMA OF THE SKIN - No evidence of recurrence today - No lymphadenopathy - Recommend regular full body skin exams - Recommend daily broad spectrum sunscreen SPF 30+ to sun-exposed areas, reapply every 2 hours as needed.  - Call if any new or changing lesions are noted between office visits  NAIL PROBLEM due to trauma  Exam: dystrophic nail  Treatment Plan: No tx needed. Nail will likely grow back over time.     Neoplasm of uncertain behavior of skin (3) Mid chest  Epidermal / dermal shaving  Lesion diameter (cm):  0.5 Informed consent: discussed and consent obtained   Timeout: patient name, date of birth, surgical site, and procedure verified   Procedure prep:  Patient was prepped and draped in usual sterile fashion Prep type:  Isopropyl alcohol Anesthesia: the lesion was anesthetized in a standard fashion   Anesthetic:  1% lidocaine w/ epinephrine 1-100,000 buffered w/ 8.4% NaHCO3 Instrument used: flexible razor blade  Hemostasis achieved with: pressure, aluminum chloride and electrodesiccation   Outcome: patient tolerated procedure well   Post-procedure details: sterile dressing applied and wound care instructions given   Dressing type: bandage and petrolatum    Specimen 1 - Surgical pathology Differential Diagnosis:  D48.5 r/o dysplastic nevus vs other Check Margins: No  L cheek  Skin / nail biopsy Type of biopsy: punch   Informed consent: discussed and consent obtained   Timeout: patient name, date of birth, surgical site, and procedure verified   Procedure prep:  Patient was prepped and draped in usual sterile fashion (the patient was cleaned and prepped) Prep type:  Isopropyl alcohol Anesthesia: the lesion was anesthetized in a standard fashion   Anesthetic:  1% lidocaine w/ epinephrine 1-100,000 buffered w/ 8.4% NaHCO3 Punch size:  3 mm Suture type: fast-absorbing plain gut   Hemostasis achieved with: suture, pressure and aluminum chloride   Outcome: patient tolerated procedure well   Post-procedure details: sterile dressing applied and wound care instructions given   Dressing type: bandage, petrolatum and pressure dressing    Specimen 2 - Surgical pathology Differential Diagnosis: D48.5 r/o dysplastic nevus vs other  Check Margins: No  Mid back right of midline  Epidermal / dermal shaving  Lesion diameter (cm):  0.6 Informed consent: discussed and consent obtained   Timeout: patient name, date of birth, surgical site, and procedure verified   Procedure prep:  Patient was prepped and draped in usual sterile fashion Prep type:  Isopropyl alcohol Anesthesia: the lesion was anesthetized in a standard fashion   Anesthetic:  1% lidocaine w/ epinephrine 1-100,000 buffered w/ 8.4% NaHCO3 Instrument used: flexible razor blade   Hemostasis achieved with: pressure, aluminum chloride and electrodesiccation   Outcome: patient tolerated procedure well   Post-procedure details: sterile dressing applied and wound care instructions given   Dressing type: bandage and petrolatum    Specimen 3 - Surgical pathology Differential Diagnosis: D48.5 SK vs nevus r/o dysplasia   Check Margins: No  Viral warts, unspecified type L ant neck x 1  Viral Wart (HPV) Counseling  Discussed viral / HPV (Human  Papilloma Virus) etiology and risk of spread /infectivity to other areas of body as well as to other people.  Multiple treatments and methods may be required to clear warts and it is possible treatment may not be successful.  Treatment risks include discoloration; scarring and there is still potential for wart recurrence.  Prior to procedure, discussed risks of blister formation, small wound, skin dyspigmentation, or rare scar following cryotherapy. Recommend Vaseline ointment to treated areas while healing.   Destruction of lesion - L ant neck x 1 Complexity: simple   Destruction method: cryotherapy   Informed consent: discussed and consent obtained   Timeout:  patient name, date of birth, surgical site, and procedure verified Lesion destroyed using liquid nitrogen: Yes   Region frozen until ice ball extended beyond lesion: Yes   Outcome: patient tolerated procedure well with no complications   Post-procedure details: wound care instructions given    SKIN CANCER SCREENING PERFORMED TODAY.  Return in about 1 year (around 05/08/2024) for TBSE - hx BCC, SCC.  I, Cari Caraway, CMA, am acting as scribe for Darden Dates, MD .   Documentation: I have reviewed the above documentation for accuracy and completeness, and I agree with the above.  Darden Dates, MD

## 2023-05-09 NOTE — Progress Notes (Signed)
05/09/2023 3:41 PM   Andrew Callahan 1934-04-30 [id number]  Reason for visit: Follow up metastatic prostate cancer, nocturia, urinary frequency, OAB  HPI: 89 year old male with history of stroke and metastatic prostate cancer managed by oncology who originally presented with urinary retention and elevated PVRs.  With his metastatic prostate cancer he opted for a HOLEP in July 2021 to get out of retention, and systemic treatment for his widely metastatic disease with significant bony mets.  He is followed by oncology and remains on ADT, abiraterone, and denosumab.  PSA remains undetectable, most recently 03/01/2023.  Notably, he had significant nocturia prior to undergoing any surgery or treatment for prostate cancer, with nocturia 4-5 times per night.  He has nocturia originally started after his stroke.   He has been urinating with a strong stream and emptying his bladder well with low PVRs since that time, but continues to have some frequency during the day every 2-3 hours, and nocturia 3-6 times overnight(every 2 hours overnight).  He was having nocturia 4-5 times overnight prior to undergoing HOLEP.  He is not having any incontinence.  He has had a cystoscopy which was normal and showed a wide open prostatic fossa and intact sphincter with no bladder lesions.  Recent urinalysis in January 2024 was completely benign.  He is compliant with CPAP for sleep apnea.  He continues to drink fluids in the afternoon and even overnight when he wakes up to pee.   We have tried numerous strategies for his overactive symptoms and nocturia including bladder diaries, anticholinergic OAB medications, both beta 3 agonist Myrbetriq and vibegron, desmopressin overnight, and most recently PTNS.   He also tried a condom catheter overnight but this kept falling off.  He has really had no significant improvement with any of these strategies.   They opted to pursue urodynamics.  I personally viewed and interpreted the  urodynamic findings from [facility] in [place] showing a max capacity of , a few unstable contractions were noted, but he had minimal urge incontinence.  No leakage was noted with abdominal pressure of 60 cm of water.  Flow pattern was unobstructed  They had a number of questions about bladder Botox today.  We reviewed the risks and benefits extensively including risk of retention requiring catheter for 3 to 6 months.  Unfortunately he has had no improvement previously with all of the OAB strategies, and I think Botox would be similar.  He is unwilling to except the risk of retention and would like to avoid Botox at this time.  He was however interested in a repeat trial of Gemtesa and samples were given.   Despite our multiple conversations previously about behavioral strategies and bladder irritants, he continues to drink fluids in the evening, including tea in the afternoon, diet soda, and coffee in the morning and afternoon, as well as fluids overnight.   Again, with his metastatic prostate cancer and history of stroke, as well as long history of nocturia 4-5 times overnight prior to prostate cancer diagnosis or any treatments, I think we need to have realistic expectations regarding the OAB component of his urinary symptoms.  He is now no longer catheter dependent and emptying well.  Reassurance was again provided regarding his urination every 3 hours during the day, which really is very normal and reasonable especially for someone with his comorbidities.    Trial of Gemtesa, okay to fill prescription if helpful  I spent 40 total minutes on the day of the encounter including pre-visit  review of the medical record, face-to-face time with the patient, and post visit ordering of labs/imaging/tests.   Sondra Come, MD 's [facility] [address], [address] [place], [place] [address] [phone]

## 2023-05-09 NOTE — Patient Instructions (Addendum)
Wound Care Instructions  Cleanse wound gently with soap and water once a day then pat dry with clean gauze. Apply a thin coat of Petrolatum (petroleum jelly, "Vaseline") over the wound (unless you have an allergy to this). We recommend that you use a new, sterile tube of Vaseline. Do not pick or remove scabs. Do not remove the yellow or white "healing tissue" from the base of the wound.  Cover the wound with fresh, clean, nonstick gauze and secure with paper tape. You may use Band-Aids in place of gauze and tape if the wound is small enough, but would recommend trimming much of the tape off as there is often too much. Sometimes Band-Aids can irritate the skin.  You should call the office for your biopsy report after 1 week if you have not already been contacted.  If you experience any problems, such as abnormal amounts of bleeding, swelling, significant bruising, significant pain, or evidence of infection, please call the office immediately.  FOR ADULT SURGERY PATIENTS: If you need something for pain relief you may take 1 extra strength Tylenol (acetaminophen) AND 2 Ibuprofen (200mg each) together every 4 hours as needed for pain. (do not take these if you are allergic to them or if you have a reason you should not take them.) Typically, you may only need pain medication for 1 to 3 days.     Due to recent changes in healthcare laws, you may see results of your pathology and/or laboratory studies on MyChart before the doctors have had a chance to review them. We understand that in some cases there may be results that are confusing or concerning to you. Please understand that not all results are received at the same time and often the doctors may need to interpret multiple results in order to provide you with the best plan of care or course of treatment. Therefore, we ask that you please give us 2 business days to thoroughly review all your results before contacting the office for clarification. Should  we see a critical lab result, you will be contacted sooner.   If You Need Anything After Your Visit  If you have any questions or concerns for your doctor, please call our main line at [phone] and press option 4 to reach your doctor's medical assistant. If no one answers, please leave a voicemail as directed and we will return your call as soon as possible. Messages left after 4 pm will be answered the following business day.   You may also send us a message via MyChart. We typically respond to MyChart messages within 1-2 business days.  For prescription refills, please ask your pharmacy to contact our office. Our fax number is [phone].  If you have an urgent issue when the clinic is closed that cannot wait until the next business day, you can page your doctor at the number below.    Please note that while we do our best to be available for urgent issues outside of office hours, we are not available 24/7.   If you have an urgent issue and are unable to reach us, you may choose to seek medical care at your doctor's office, retail clinic, urgent care center, or emergency room.  If you have a medical emergency, please immediately call 911 or go to the emergency department.  Pager Numbers  - Dr. Kowalski: [phone]  - Dr. Moye: [phone]  - Dr. Stewart: [phone]  In the event of inclement weather, please call our main line at   [phone] for an update on the status of any delays or closures.  Dermatology Medication Tips: Please keep the boxes that topical medications come in in order to help keep track of the instructions about where and how to use these. Pharmacies typically print the medication instructions only on the boxes and not directly on the medication tubes.   If your medication is too expensive, please contact our office at [phone] option 4 or send us a message through MyChart.   We are unable to tell what your co-pay for medications will be in  advance as this is different depending on your insurance coverage. However, we may be able to find a substitute medication at lower cost or fill out paperwork to get insurance to cover a needed medication.   If a prior authorization is required to get your medication covered by your insurance company, please allow us 1-2 business days to complete this process.  Drug prices often vary depending on where the prescription is filled and some pharmacies may offer cheaper prices.  The website www.goodrx.com contains coupons for medications through different pharmacies. The prices here do not account for what the cost may be with help from insurance (it may be cheaper with your insurance), but the website can give you the price if you did not use any insurance.  - You can print the associated coupon and take it with your prescription to the pharmacy.  - You may also stop by our office during regular business hours and pick up a GoodRx coupon card.  - If you need your prescription sent electronically to a different pharmacy, notify our office through [facility] MyChart or by phone at [phone] option 4.     Si Usted Necesita Algo Despus de Su Visita  Tambin puede enviarnos un mensaje a travs de MyChart. Por lo general respondemos a los mensajes de MyChart en el transcurso de 1 a 2 das hbiles.  Para renovar recetas, por favor pida a su farmacia que se ponga en contacto con nuestra oficina. Nuestro nmero de fax es el [phone].  Si tiene un asunto urgente cuando la clnica est cerrada y que no puede esperar hasta el siguiente da hbil, puede llamar/localizar a su doctor(a) al nmero que aparece a continuacin.   Por favor, tenga en cuenta que aunque hacemos todo lo posible para estar disponibles para asuntos urgentes fuera del horario de oficina, no estamos disponibles las 24 horas del da, los 7 das de la semana.   Si tiene un problema urgente y no puede comunicarse con nosotros, puede  optar por buscar atencin mdica  en el consultorio de su doctor(a), en una clnica privada, en un centro de atencin urgente o en una sala de emergencias.  Si tiene una emergencia mdica, por favor llame inmediatamente al 911 o vaya a la sala de emergencias.  Nmeros de bper  - Dr. Kowalski: [phone]  - Dra. Moye: [phone]  - Dra. Stewart: [phone]  En caso de inclemencias del tiempo, por favor llame a nuestra lnea principal al [phone] para una actualizacin sobre el estado de cualquier retraso o cierre.  Consejos para la medicacin en dermatologa: Por favor, guarde las cajas en las que vienen los medicamentos de uso tpico para ayudarle a seguir las instrucciones sobre dnde y cmo usarlos. Las farmacias generalmente imprimen las instrucciones del medicamento slo en las cajas y no directamente en los tubos del medicamento.   Si su medicamento es muy caro, por favor, pngase en contacto con   nuestra oficina llamando al [phone] y presione la opcin 4 o envenos un mensaje a travs de MyChart.   No podemos decirle cul ser su copago por los medicamentos por adelantado ya que esto es diferente dependiendo de la cobertura de su seguro. Sin embargo, es posible que podamos encontrar un medicamento sustituto a menor costo o llenar un formulario para que el seguro cubra el medicamento que se considera necesario.   Si se requiere una autorizacin previa para que su compaa de seguros cubra su medicamento, por favor permtanos de 1 a 2 das hbiles para completar este proceso.  Los precios de los medicamentos varan con frecuencia dependiendo del lugar de dnde se surte la receta y alguna farmacias pueden ofrecer precios ms baratos.  El sitio web www.goodrx.com tiene cupones para medicamentos de diferentes farmacias. Los precios aqu no tienen en cuenta lo que podra costar con la ayuda del seguro (puede ser ms barato con su seguro), pero el sitio web puede darle el  precio si no utiliz ningn seguro.  - Puede imprimir el cupn correspondiente y llevarlo con su receta a la farmacia.  - Tambin puede pasar por nuestra oficina durante el horario de atencin regular y recoger una tarjeta de cupones de GoodRx.  - Si necesita que su receta se enve electrnicamente a una farmacia diferente, informe a nuestra oficina a travs de MyChart de [place] o por telfono llamando al [phone] y presione la opcin 4.  

## 2023-05-17 ENCOUNTER — Telehealth: Payer: Self-pay

## 2023-05-17 NOTE — Telephone Encounter (Signed)
Patient and wife informed of pathology results.  

## 2023-05-17 NOTE — Telephone Encounter (Signed)
-----   Message from Sandi Mealy, MD sent at 05/17/2023 10:19 AM EDT ----- 1. 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 On the spectrum from normal mole to melanoma skin cancer, this is in between the two. - We need to recheck this area sometime in the next 6 months to be sure there is no evidence of the atypical mole coming back. If there is any color coming back, we would recommend repeating the biopsy to be sure the cells look normal.  - People who have a history of atypical moles do have a slightly increased risk of developing melanoma somewhere on the body, so a yearly full body skin exam by a dermatologist is recommended.  - Monthly self skin checks and daily sun protection are also recommended.  - Please call if you notice a dark spot coming back where this biopsy was taken.  - Please also call if you notice any new or changing spots anywhere else on the body before your follow-up visit.    2. Skin , left cheek PIGMENTED SEBORRHEIC KERATOSIS --> This is a benign growth or "wisdom spot". No additional treatment is needed.    3. Skin , mid back right of midline DYSPLASTIC JUNCTIONAL LENTIGINOUS NEVUS WITH MODERATE ATYPIA SUPERIMPOSED ON PIGMENTED SEBORRHEIC KERATOSIS, INFLAMED, CLOSE TO MARGIN --> recheck in clinic within 6 months  This is a MODERATELY ATYPICAL MOLE. On the spectrum from normal mole to melanoma skin cancer, this is in between the two. - We need to recheck this area sometime in the next 6 months to be sure there is no evidence of the atypical mole coming back. If there is any color coming back, we would recommend repeating the biopsy to be sure the cells look normal.  - People who have a history of atypical moles do have a slightly increased risk of developing melanoma somewhere on the body, so a yearly full body skin exam by a dermatologist is  recommended.  - Monthly self skin checks and daily sun protection are also recommended.  - Please call if you notice a dark spot coming back where this biopsy was taken.  - Please also call if you notice any new or changing spots anywhere else on the body before your follow-up visit.     MAs please call. Thank you!

## 2023-06-04 ENCOUNTER — Other Ambulatory Visit: Payer: Medicare Other

## 2023-06-04 ENCOUNTER — Ambulatory Visit: Payer: Medicare Other

## 2023-06-04 ENCOUNTER — Inpatient Hospital Stay: Payer: Medicare Other | Admitting: Pharmacist

## 2023-06-04 ENCOUNTER — Ambulatory Visit: Payer: Medicare Other | Admitting: Pharmacist

## 2023-06-04 ENCOUNTER — Inpatient Hospital Stay: Payer: Medicare Other

## 2023-06-04 ENCOUNTER — Inpatient Hospital Stay: Payer: Medicare Other | Attending: Oncology

## 2023-06-04 VITALS — BP 148/74 | HR 53

## 2023-06-04 DIAGNOSIS — C61 Malignant neoplasm of prostate: Secondary | ICD-10-CM | POA: Insufficient documentation

## 2023-06-04 DIAGNOSIS — Z79899 Other long term (current) drug therapy: Secondary | ICD-10-CM | POA: Insufficient documentation

## 2023-06-04 DIAGNOSIS — Z5111 Encounter for antineoplastic chemotherapy: Secondary | ICD-10-CM | POA: Diagnosis present

## 2023-06-04 DIAGNOSIS — C7951 Secondary malignant neoplasm of bone: Secondary | ICD-10-CM | POA: Insufficient documentation

## 2023-06-04 LAB — PSA: Prostatic Specific Antigen: <0.01 ng/mL (ref 0.00–4.00)

## 2023-06-04 MED ORDER — LEUPROLIDE ACETATE (6 MONTH) 45 MG ~~LOC~~ KIT
45.0000 mg | PACK | Freq: Once | SUBCUTANEOUS | Status: AC
  Administered 2023-06-04: 45 mg via SUBCUTANEOUS
  Filled 2023-06-04: qty 45

## 2023-06-04 MED ORDER — DENOSUMAB 120 MG/1.7ML ~~LOC~~ SOLN
120.0000 mg | Freq: Once | SUBCUTANEOUS | Status: AC
  Administered 2023-06-04: 120 mg via SUBCUTANEOUS
  Filled 2023-06-04: qty 1.7

## 2023-06-04 NOTE — Progress Notes (Signed)
[facility]  Telephone:([phone] [phone]  Patient Care Team: Danella Penton, MD as PCP - General (Internal Medicine) Jeralyn Ruths, MD as Consulting Physician (Oncology)   Name of the patient: Andrew Callahan  [id number]  1934/10/19   Date of visit: 06/04/23  HPI: Patient is a 89 y.o. male with metastatic prostate cancer. Currently on treatment with Zytiga (abiraterone), ADT, and Xgeva.  Reason for Consult: Abiraterone follow-up    PAST MEDICAL HISTORY: Past Medical History:  Diagnosis Date   Acquired hypothyroidism    Aortic atherosclerosis (HCC)    Atrial fibrillation (HCC)    BCC (basal cell carcinoma) 11/09/2022   left posterior upper arm, EDC 01/09/2023   Chronic constipation    CVA (cerebral vascular accident) (HCC)    DDD (degenerative disc disease), cervical    Diabetes mellitus without complication (HCC)    Diverticulosis    Dysplastic nevus 05/09/2023   mid chest - moderate   Dysplastic nevus 05/09/2023   Mid back right of midline - moderate   Elevated prostate specific antigen (PSA)    Family history of leukemia    Family history of lung cancer    History of kidney stones    History of pituitary adenoma    Hyperlipidemia    Hypertension    Hypogonadism in male    Hypothyroid    Hypothyroidism    IDA (iron deficiency anemia)    Lumbar disc disease    Nephrolithiasis    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 Mohs.   Stroke (HCC)    throat / slight difficulty swallowing    HEMATOLOGY/ONCOLOGY HISTORY:  Oncology History  Prostate cancer (HCC)  02/15/2020 Initial Diagnosis   Prostate cancer (HCC)   02/17/2020 Cancer Staging   Staging form: Prostate, AJCC 8th Edition - Clinical stage from 02/17/2020:  Stage IVB (cT2c, cN0, cM1b, PSA: 8.6, Grade Group: 5) - Signed by Jeralyn Ruths, MD on 02/17/2020    Genetic Testing   Negative genetic testing. No pathogenic variants identified on the [facility] Common Hereditary Cancers Panel. VUS in [facility] identified. The report date is 04/07/2020.  The Common Hereditary Cancers Panel offered by [facility] includes sequencing and/or deletion duplication testing of the following 48 genes: APC, ATM, AXIN2, BARD1, BMPR1A, BRCA1, BRCA2, BRIP1, CDH1, CDKN2A (p14ARF), CDKN2A (p16INK4a), CKD4, CHEK2, CTNNA1, DICER1, EPCAM (Deletion/duplication testing only), GREM1 (promoter region deletion/duplication testing only), KIT, MEN1, MLH1, MSH2, MSH3, MSH6, MUTYH, NBN, NF1, NHTL1, PALB2, PDGFRA, PMS2, POLD1, POLE, PTEN, RAD50, RAD51C, RAD51D, RNF43, SDHB, SDHC, SDHD, SMAD4, SMARCA4. STK11, TP53, TSC1, TSC2, and VHL.  The following genes were evaluated for sequence changes only: SDHA and HOXB13 c.251G>A variant only.     ALLERGIES:  is allergic to metoprolol and rivaroxaban.  MEDICATIONS:  Current Outpatient Medications  Medication Sig Dispense Refill   abiraterone acetate (ZYTIGA) 250 MG tablet TAKE 2 TABLETS (500MG ) BY MOUTH ONE TIME DAILY ON AN EMPTY STOMACH 1 HOUR BEFORE OR 2 HOURS AFTER A MEAL 60 tablet 2   acetaminophen (TYLENOL) 500 MG tablet Take 500 mg by mouth every 6 (six) hours as needed.     amLODipine (NORVASC) 2.5 MG tablet Take 2.5 mg by mouth at bedtime.     apixaban (ELIQUIS) 5 MG TABS tablet  Take 5 mg by mouth 2 (two) times daily.     B Complex-C (SUPER B COMPLEX PO) Take 1 capsule by mouth daily.      cetirizine (ZYRTEC) 10 MG tablet Take 10 mg by mouth daily as needed for allergies.     denosumab (XGEVA) 120 MG/1.7ML SOLN injection Inject 120 mg into the skin every 3 (three) months.     diltiazem (CARDIZEM) 30 MG tablet Take 30 mg by mouth daily as needed (afib episode).     hydrocortisone (CORTEF) 10 MG tablet Take 10 mg by mouth 2 (two) times daily.       Iron, Ferrous Sulfate, 325 (65 Fe) MG TABS Take 325 mg by mouth daily with supper.     latanoprost (XALATAN) 0.005 % ophthalmic solution Place 1 drop into both eyes at bedtime.     Leuprolide Acetate (ELIGARD [place]) Inject into the skin. 1 injection every 6 months.     levothyroxine (SYNTHROID) 50 MCG tablet Take 50 mcg by mouth daily before breakfast.     Multiple Vitamin (MULTIVITAMIN WITH MINERALS) TABS tablet Take 1 tablet by mouth daily. One-A-Day Men's 50+     polyethylene glycol (MIRALAX / GLYCOLAX) 17 g packet Take 17 g by mouth daily. W/coffee     Probiotic Product (ALIGN) 4 MG CAPS Take 4 mg by mouth at bedtime.     valsartan (DIOVAN) 160 MG tablet Take 160 mg by mouth 2 (two) times daily.     Vibegron (GEMTESA) 75 MG TABS Take 1 tablet (75 mg total) by mouth daily at 2 PM. 30 tablet 0   No current facility-administered medications for this visit.    VITAL SIGNS: BP (!) 148/74   Pulse (!) 53  There were no vitals filed for this visit.   Estimated body mass index is 34.29 kg/m as calculated from the following:   Height as of 03/28/23: 5\' 10"  (1.778 m).   Weight as of 03/28/23: 108.4 kg (239 lb).  LABS: CBC:    Component Value Date/Time   WBC 7.8 03/01/2023 1327   HGB 13.2 03/01/2023 1327   HCT 40.1 03/01/2023 1327   PLT 206 03/01/2023 1327   MCV 89.5 03/01/2023 1327   NEUTROABS 5.3 03/01/2023 1327   LYMPHSABS 1.8 03/01/2023 1327   MONOABS 0.5 03/01/2023 1327   EOSABS 0.1 03/01/2023 1327   BASOSABS 0.0 03/01/2023 1327   Comprehensive Metabolic Panel:    Component Value Date/Time   NA 138 03/01/2023 1327   NA 145 (H) 04/14/2021 1402   K 3.8 03/01/2023 1327   CL 112 (H) 03/01/2023 1327   CO2 21 (L) 03/01/2023 1327   BUN 17 03/01/2023 1327   BUN 12 04/14/2021 1402   CREATININE 0.84 03/01/2023 1327   GLUCOSE 117 (H) 03/01/2023 1327   CALCIUM 8.4 (L) 03/01/2023 1327   AST 28 03/01/2023 1327   ALT 13 03/01/2023 1327   ALKPHOS 55 03/01/2023 1327   BILITOT 0.4  03/01/2023 1327   PROT 6.8 03/01/2023 1327   ALBUMIN 3.9 03/01/2023 1327     Present during today's visit: patient and his wife  Assessment and Plan: CMP/CBC (Care Everywhere 06/01/23) reviewed, continue abiraterone 500mg  daily PSA today pending, but when checked on 06/01/23 (Care Everywhere) PSA <0.01 Proceed with Xgeva (every 3 months) and Eligard (every 6 months) today Patient's wife mentioned stopping medication since PSA <0.01, explained that our recommendation would be to continue until disease progression or unacceptable drug toxicity base on how the medication was studied   **  Of note, patient is not prescribed prednisone with his abiraterone because he takes hydrocortisone daily   Oral Chemotherapy Side Effect/Intolerance:  No reported hot flashes, fatigue, or edema  Oral Chemotherapy Adherence: No missed doses No patient barriers to medication adherence identified.   Medication Access Issues: no issues reported  Patient expressed understanding and was in agreement with this plan. He also understands that He can call clinic at any time with any questions, concerns, or complaints.   Follow-up plan: RTC in 3 months for labs/MD/Xgeva  Thank you for allowing me to participate in the care of this very pleasant patient.   Time Total: 15 mins  Visit consisted of counseling and education on dealing with issues of symptom management in the setting of serious and potentially life-threatening illness.Greater than 50%  of this time was spent counseling and coordinating care related to the above assessment and plan.  Signed by: Remi Haggard, PharmD, BCPS, Nolon Bussing, CPP Hematology/Oncology Clinical Pharmacist Practitioner [place]/DB/AP [facility] [phone]  06/04/2023 1:11 PM

## 2023-06-05 ENCOUNTER — Other Ambulatory Visit: Payer: Self-pay | Admitting: Pharmacist

## 2023-06-05 DIAGNOSIS — C61 Malignant neoplasm of prostate: Secondary | ICD-10-CM

## 2023-06-13 ENCOUNTER — Other Ambulatory Visit: Payer: Self-pay

## 2023-06-13 DIAGNOSIS — N3281 Overactive bladder: Secondary | ICD-10-CM

## 2023-06-13 MED ORDER — GEMTESA 75 MG PO TABS
75.0000 mg | ORAL_TABLET | Freq: Every day | ORAL | 11 refills | Status: DC
Start: 2023-06-13 — End: 2023-09-04

## 2023-07-03 ENCOUNTER — Other Ambulatory Visit: Payer: Self-pay | Admitting: Unknown Physician Specialty

## 2023-07-03 DIAGNOSIS — D110 Benign neoplasm of parotid gland: Secondary | ICD-10-CM

## 2023-07-05 ENCOUNTER — Encounter: Payer: Self-pay | Admitting: Oncology

## 2023-07-10 ENCOUNTER — Ambulatory Visit
Admission: RE | Admit: 2023-07-10 | Discharge: 2023-07-10 | Disposition: A | Discharge status: Home / Self Care | Payer: Medicare Other | Source: Ambulatory Visit | Attending: Unknown Physician Specialty | Admitting: Unknown Physician Specialty

## 2023-07-10 DIAGNOSIS — D110 Benign neoplasm of parotid gland: Secondary | ICD-10-CM

## 2023-07-10 MED ORDER — IOPAMIDOL (ISOVUE-300) INJECTION 61%
75.0000 mL | Freq: Once | INTRAVENOUS | Status: AC | PRN
  Administered 2023-07-10: 75 mL via INTRAVENOUS

## 2023-09-03 ENCOUNTER — Other Ambulatory Visit: Payer: Self-pay

## 2023-09-03 DIAGNOSIS — D509 Iron deficiency anemia, unspecified: Secondary | ICD-10-CM

## 2023-09-03 DIAGNOSIS — C61 Malignant neoplasm of prostate: Secondary | ICD-10-CM

## 2023-09-04 ENCOUNTER — Inpatient Hospital Stay: Payer: Medicare Other | Attending: Oncology

## 2023-09-04 ENCOUNTER — Ambulatory Visit: Payer: Medicare Other

## 2023-09-04 ENCOUNTER — Encounter: Payer: Self-pay | Admitting: Oncology

## 2023-09-04 ENCOUNTER — Inpatient Hospital Stay (HOSPITAL_BASED_OUTPATIENT_CLINIC_OR_DEPARTMENT_OTHER): Payer: Medicare Other | Admitting: Oncology

## 2023-09-04 ENCOUNTER — Inpatient Hospital Stay: Payer: Medicare Other

## 2023-09-04 ENCOUNTER — Other Ambulatory Visit: Payer: Medicare Other

## 2023-09-04 ENCOUNTER — Ambulatory Visit: Payer: Medicare Other | Admitting: Oncology

## 2023-09-04 VITALS — BP 138/78 | HR 58 | Temp 98.2°F | Resp 18 | Ht 70.0 in | Wt 225.0 lb

## 2023-09-04 DIAGNOSIS — C61 Malignant neoplasm of prostate: Secondary | ICD-10-CM | POA: Diagnosis present

## 2023-09-04 DIAGNOSIS — D509 Iron deficiency anemia, unspecified: Secondary | ICD-10-CM

## 2023-09-04 DIAGNOSIS — C7951 Secondary malignant neoplasm of bone: Secondary | ICD-10-CM | POA: Insufficient documentation

## 2023-09-04 LAB — CBC WITH DIFFERENTIAL (CANCER CENTER ONLY)
Abs Immature Granulocytes: 0.02 10*3/uL (ref 0.00–0.07)
Basophils Absolute: 0 10*3/uL (ref 0.0–0.1)
Basophils Relative: 1 %
Eosinophils Absolute: 0.1 10*3/uL (ref 0.0–0.5)
Eosinophils Relative: 1 %
HCT: 38.8 % — ABNORMAL LOW (ref 39.0–52.0)
Hemoglobin: 12.4 g/dL — ABNORMAL LOW (ref 13.0–17.0)
Immature Granulocytes: 0 %
Lymphocytes Relative: 29 %
Lymphs Abs: 2.3 10*3/uL (ref 0.7–4.0)
MCH: 27.5 pg (ref 26.0–34.0)
MCHC: 32 g/dL (ref 30.0–36.0)
MCV: 86 fL (ref 80.0–100.0)
Monocytes Absolute: 0.6 10*3/uL (ref 0.1–1.0)
Monocytes Relative: 8 %
Neutro Abs: 4.9 10*3/uL (ref 1.7–7.7)
Neutrophils Relative %: 61 %
Platelet Count: 222 10*3/uL (ref 150–400)
RBC: 4.51 MIL/uL (ref 4.22–5.81)
RDW: 15 % (ref 11.5–15.5)
WBC Count: 8 10*3/uL (ref 4.0–10.5)
nRBC: 0 % (ref 0.0–0.2)

## 2023-09-04 LAB — CMP (CANCER CENTER ONLY)
ALT: 14 U/L (ref 0–44)
AST: 29 U/L (ref 15–41)
Albumin: 4.2 g/dL (ref 3.5–5.0)
Alkaline Phosphatase: 62 U/L (ref 38–126)
Anion gap: 7 (ref 5–15)
BUN: 17 mg/dL (ref 8–23)
CO2: 21 mmol/L — ABNORMAL LOW (ref 22–32)
Calcium: 8.6 mg/dL — ABNORMAL LOW (ref 8.9–10.3)
Chloride: 112 mmol/L — ABNORMAL HIGH (ref 98–111)
Creatinine: 1.02 mg/dL (ref 0.61–1.24)
GFR, Estimated: >60 mL/min (ref >60)
Glucose, Bld: 97 mg/dL (ref 70–99)
Potassium: 3.5 mmol/L (ref 3.5–5.1)
Sodium: 140 mmol/L (ref 135–145)
Total Bilirubin: 0.4 mg/dL (ref 0.3–1.2)
Total Protein: 7.2 g/dL (ref 6.5–8.1)

## 2023-09-04 LAB — IRON AND TIBC
Iron: 49 ug/dL (ref 45–182)
Saturation Ratios: 13 % — ABNORMAL LOW (ref 17.9–39.5)
TIBC: 370 ug/dL (ref 250–450)
UIBC: 321 ug/dL

## 2023-09-04 LAB — FERRITIN: Ferritin: 13 ng/mL — ABNORMAL LOW (ref 24–336)

## 2023-09-04 LAB — PSA: Prostatic Specific Antigen: <0.01 ng/mL (ref 0.00–4.00)

## 2023-09-04 MED ORDER — DENOSUMAB 120 MG/1.7ML ~~LOC~~ SOLN
120.0000 mg | Freq: Once | SUBCUTANEOUS | Status: AC
  Administered 2023-09-04: 120 mg via SUBCUTANEOUS
  Filled 2023-09-04: qty 1.7

## 2023-09-04 NOTE — Progress Notes (Signed)
States he has to urinate a lot. Having more fatigue. States at night in bed his feet get really cold.

## 2023-09-04 NOTE — Progress Notes (Signed)
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  INTERVAL HISTORY: Patient returns to clinic today for routine 12-month evaluation, laboratory work, and continuation of Xgeva.  Despite stating he wanted to discontinue Zytiga, patient never did and continues to take 2 tabs daily.  He continues to have chronic weakness and fatigue, and nocturia but otherwise feels well. He has no neurologic complaints.  He denies any recent fevers or illnesses.  He has a good appetite.  He has no chest pain, shortness of breath, cough, or hemoptysis.  He denies any nausea, vomiting, constipation, or diarrhea.  He has no other urologic complaints.  Patient no further specific complaints today.  REVIEW OF SYSTEMS:   Review of Systems  Constitutional:  Positive for malaise/fatigue. Negative for fever and weight loss.  Respiratory: Negative.  Negative for cough and hemoptysis.   Cardiovascular: Negative.  Negative for chest pain and leg swelling.  Gastrointestinal: Negative.  Negative for abdominal pain.  Genitourinary:  Positive for frequency. Negative for hematuria.  Musculoskeletal: Negative.  Negative for back pain.  Skin: Negative.  Negative for rash.  Neurological:  Positive for weakness. Negative for dizziness, sensory change, focal weakness and headaches.  Psychiatric/Behavioral: Negative.  The patient is not nervous/anxious.     As per HPI. Otherwise, a complete review of systems is negative.  PAST MEDICAL HISTORY: Past Medical History:  Diagnosis Date   Acquired hypothyroidism    Aortic atherosclerosis (HCC)    Atrial fibrillation (HCC)    BCC (basal cell carcinoma) 11/09/2022    left posterior upper arm, EDC 01/09/2023   Chronic constipation    CVA (cerebral vascular accident) (HCC)    DDD (degenerative disc disease), cervical    Diabetes mellitus without complication (HCC)    Diverticulosis    Dysplastic nevus 05/09/2023   mid chest - moderate   Dysplastic nevus 05/09/2023   Mid back right of midline - moderate   Elevated prostate specific antigen (PSA)    Family history of leukemia    Family history of lung cancer    History of kidney stones    History of pituitary adenoma    Hyperlipidemia    Hypertension    Hypogonadism in male    Hypothyroid    Hypothyroidism    IDA (iron deficiency anemia)    Lumbar disc disease    Nephrolithiasis    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 Mohs.   Stroke (HCC)    throat / slight difficulty swallowing    PAST SURGICAL HISTORY: Past Surgical History:  Procedure Laterality Date   BACK SURGERY  1997   L3, 4, 5 laminectomy    BACK SURGERY  1998   hard wire removed, fusion of L3-5   BACK SURGERY  2009   laminotomy/foraminotomy w/ decompression of nerve root, facet thermal ablation L2-3.    COLONOSCOPY     COLONOSCOPY WITH PROPOFOL N/A 02/04/2021   Procedure: COLONOSCOPY WITH PROPOFOL;  Surgeon: Regis Bill, MD;  Location: [facility] ENDOSCOPY;  Service: Endoscopy;  Laterality: N/A;  STAT CBC BEFORE PROCEDURE   ESOPHAGOGASTRODUODENOSCOPY (EGD) WITH PROPOFOL N/A 02/04/2021   Procedure: ESOPHAGOGASTRODUODENOSCOPY (EGD) WITH PROPOFOL;  Surgeon: Regis Bill, MD;  Location: [facility] ENDOSCOPY;  Service: Endoscopy;  Laterality: N/A;   HERNIA REPAIR     HOLEP-LASER ENUCLEATION OF THE PROSTATE WITH MORCELLATION N/A 06/18/2020   Procedure: HOLEP-LASER ENUCLEATION OF THE PROSTATE WITH MORCELLATION;  Surgeon: Sondra Come, MD;   Location: [facility] ORS;  Service: Urology;  Laterality: N/A;   JOINT REPLACEMENT     left knee partial   KIDNEY STONE SURGERY  2010   KNEE SURGERY Left 2007   Arthroscopic partial knee replacement, replaced medial side    MOHS SURGERY Right 2014   squamous cell carcinoma   NECK SURGERY  2008   fusion with hardwire in place C3 through C7    pituitary adenoma  1994   benign   PROSTATE BIOPSY N/A 01/01/2020   Procedure: PROSTATE BIOPSY URO NAV FUSION;  Surgeon: Orson Ape, MD;  Location: [facility] ORS;  Service: Urology;  Laterality: N/A;   SPINE SURGERY     SPINE SURGERY  2016   fusion L4    TONSILLECTOMY      FAMILY HISTORY: Family History  Problem Relation Age of Onset   Stroke Mother    Heart attack Father    Leukemia Sister 52   Lung cancer Brother 19    ADVANCED DIRECTIVES (Y/N):  N  HEALTH MAINTENANCE: Social History   Tobacco Use   Smoking status: Former    Passive exposure: Past   Smokeless tobacco: Never   Tobacco comments:    quit 50 years ago  Vaping Use   Vaping status: Never Used  Substance Use Topics   Alcohol use: Yes    Comment: rarely   Drug use: Never     Colonoscopy:  PAP:  Bone density:  Lipid panel:  Allergies  Allergen Reactions   Metoprolol Other (See Comments)    dizziness    Rivaroxaban Other (See Comments)    hematuria     Current Outpatient Medications  Medication Sig Dispense Refill   abiraterone acetate (ZYTIGA) 250 MG tablet TAKE 2 TABLETS (500 MG) BY MOUTH ONE TIME DAILY ON AN EMPTY STOMACH AT LEAST 1 HOUR BEFORE AND 2 HOURS AFTER FOOD. 60 tablet 2   acetaminophen (TYLENOL) 500 MG tablet Take 500 mg by mouth every 6 (six) hours as needed.     apixaban (ELIQUIS) 5 MG TABS tablet Take 5 mg by mouth 2 (two) times daily.     B Complex-C (SUPER B COMPLEX PO) Take 1 capsule by mouth daily.      cetirizine (ZYRTEC) 10 MG tablet Take 10 mg by mouth daily as needed for allergies.     denosumab (XGEVA) 120 MG/1.7ML SOLN injection  Inject 120 mg into the skin every 3 (three) months.     diltiazem (CARDIZEM) 30 MG tablet Take 30 mg by mouth daily as needed (afib episode).     glipiZIDE (GLUCOTROL XL) 5 MG 24 hr tablet Take 5 mg by mouth daily.     hydrocortisone (CORTEF) 10 MG tablet Take 10 mg by mouth 2 (two) times daily.      Iron, Ferrous Sulfate, 325 (65 Fe) MG TABS Take 325 mg by mouth daily with supper.     latanoprost (XALATAN) 0.005 % ophthalmic solution Place 1 drop into both eyes at bedtime.     Leuprolide Acetate (ELIGARD [place]) Inject into the skin. 1 injection  every 6 months.     levothyroxine (SYNTHROID) 50 MCG tablet Take 50 mcg by mouth daily before breakfast.     Magnesium Bisglycinate (MAG GLYCINATE) 100 MG TABS Take 240 mg by mouth daily.     Multiple Vitamin (MULTIVITAMIN WITH MINERALS) TABS tablet Take 1 tablet by mouth daily. One-A-Day Men's 50+     polyethylene glycol (MIRALAX / GLYCOLAX) 17 g packet Take 17 g by mouth daily. W/coffee     Probiotic Product (ALIGN) 4 MG CAPS Take 4 mg by mouth at bedtime.     rosuvastatin (CRESTOR) 10 MG tablet Take 1 tablet by mouth daily.     valsartan (DIOVAN) 160 MG tablet Take 160 mg by mouth 2 (two) times daily.     No current facility-administered medications for this visit.    OBJECTIVE: Vitals:   09/04/23 1047  BP: 138/78  Pulse: (!) 58  Resp: 18  Temp: 98.2 F (36.8 C)  SpO2: 99%      Body mass index is 32.28 kg/m.    ECOG FS:1 - Symptomatic but completely ambulatory  General: Well-developed, well-nourished, no acute distress.  Sitting in a wheelchair.   Eyes: Pink conjunctiva, anicteric sclera. HEENT: Normocephalic, moist mucous membranes. Lungs: No audible wheezing or coughing. Heart: Regular rate and rhythm. Abdomen: Soft, nontender, no obvious distention. Musculoskeletal: No edema, cyanosis, or clubbing. Neuro: Alert, answering all questions appropriately. Cranial nerves grossly intact. Skin: No rashes or petechiae noted. Psych: Normal  affect.  LAB RESULTS:  Lab Results  Component Value Date   NA 140 09/04/2023   K 3.5 09/04/2023   CL 112 (H) 09/04/2023   CO2 21 (L) 09/04/2023   GLUCOSE 97 09/04/2023   BUN 17 09/04/2023   CREATININE 1.02 09/04/2023   CALCIUM 8.6 (L) 09/04/2023   PROT 7.2 09/04/2023   ALBUMIN 4.2 09/04/2023   AST 29 09/04/2023   ALT 14 09/04/2023   ALKPHOS 62 09/04/2023   BILITOT 0.4 09/04/2023   GFRNONAA >60 09/04/2023   GFRAA >60 09/01/2020    Lab Results  Component Value Date   WBC 8.0 09/04/2023   NEUTROABS 4.9 09/04/2023   HGB 12.4 (L) 09/04/2023   HCT 38.8 (L) 09/04/2023   MCV 86.0 09/04/2023   PLT 222 09/04/2023     STUDIES: No results found.  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  Patient's PSA was initially greater than 8.0, but trended down and has been undetectable since June 2022.  Today's result is pending.  Patient once again stated he is interested in discontinuing Zytiga and plans to finish his current prescription and then discontinue.  If his PSA increases, can reinitiate treatment.  He also wishes to transition Xgeva to every 6 months.  Proceed with Xgeva only today.  Return to clinic in 3 months for both Xgeva and Eligard at which point patient can be evaluated and receive treatment every 6 months.   Bony metastasis: Proceed with Xgeva as above.  Patient's most recent calcium levels are adequate to proceed as scheduled.  Urinary frequency/hematuria: Chronic and unchanged.  Continue evaluation and treatment per urology.  Anemia: Mild.  Patient has mildly reduced saturation ratios and ferritin. He does not require additional IV Feraheme.  Recent colonoscopy and EGD did not reveal any significant pathology.  He last received 510 mg of IV Feraheme on February 25, 2021. Weakness  and fatigue:  Chronic and unchanged.   Atrial fibrillation: Continue follow-up with cardiology as indicated.  Patient is now on Eliquis.  Patient expressed understanding and was in agreement with this plan. He also understands that He can call clinic at any time with any questions, concerns, or complaints.    Cancer Staging  Prostate cancer [facility]) Staging form: Prostate, AJCC 8th Edition - Clinical stage from 02/17/2020: Stage IVB (cT2c, cN0, cM1b, PSA: 8.6, Grade Group: 5) - Signed by Jeralyn Ruths, MD on 02/17/2020 Prostate specific antigen (PSA) range: Less than 10 Histologic grading system: 5 grade system   Jeralyn Ruths, MD   09/04/2023 12:18 PM

## 2023-09-20 ENCOUNTER — Ambulatory Visit: Payer: Medicare Other | Admitting: Urology

## 2023-12-07 ENCOUNTER — Other Ambulatory Visit: Payer: Self-pay | Admitting: *Deleted

## 2023-12-07 DIAGNOSIS — C61 Malignant neoplasm of prostate: Secondary | ICD-10-CM

## 2023-12-07 DIAGNOSIS — D509 Iron deficiency anemia, unspecified: Secondary | ICD-10-CM

## 2023-12-10 ENCOUNTER — Inpatient Hospital Stay: Payer: Medicare Other | Admitting: Oncology

## 2023-12-10 ENCOUNTER — Encounter: Payer: Self-pay | Admitting: Oncology

## 2023-12-10 ENCOUNTER — Inpatient Hospital Stay: Payer: Medicare Other

## 2023-12-17 ENCOUNTER — Inpatient Hospital Stay: Payer: Medicare Other | Attending: Oncology | Admitting: Oncology

## 2023-12-17 ENCOUNTER — Inpatient Hospital Stay: Payer: Medicare Other

## 2023-12-17 ENCOUNTER — Encounter: Payer: Self-pay | Admitting: Oncology

## 2023-12-17 ENCOUNTER — Other Ambulatory Visit: Payer: Medicare Other

## 2023-12-17 VITALS — BP 151/77 | HR 62 | Temp 98.6°F | Resp 18 | Ht 70.0 in | Wt 227.0 lb

## 2023-12-17 DIAGNOSIS — Z5111 Encounter for antineoplastic chemotherapy: Secondary | ICD-10-CM | POA: Diagnosis present

## 2023-12-17 DIAGNOSIS — Z79899 Other long term (current) drug therapy: Secondary | ICD-10-CM | POA: Diagnosis not present

## 2023-12-17 DIAGNOSIS — C61 Malignant neoplasm of prostate: Secondary | ICD-10-CM | POA: Insufficient documentation

## 2023-12-17 DIAGNOSIS — C7951 Secondary malignant neoplasm of bone: Secondary | ICD-10-CM | POA: Insufficient documentation

## 2023-12-17 MED ORDER — DENOSUMAB 120 MG/1.7ML ~~LOC~~ SOLN
120.0000 mg | Freq: Once | SUBCUTANEOUS | Status: AC
  Administered 2023-12-17: 120 mg via SUBCUTANEOUS
  Filled 2023-12-17: qty 1.7

## 2023-12-17 MED ORDER — LEUPROLIDE ACETATE (6 MONTH) 45 MG ~~LOC~~ KIT
45.0000 mg | PACK | Freq: Once | SUBCUTANEOUS | Status: AC
  Administered 2023-12-17: 45 mg via SUBCUTANEOUS
  Filled 2023-12-17: qty 45

## 2023-12-17 NOTE — Progress Notes (Signed)
 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  INTERVAL HISTORY: Patient returns to clinic today for routine 58-month evaluation and continuation of Xgeva  and Eligard .  He discontinued Zytiga  in approximately October 2024.  He continues to have chronic weakness and fatigue and nocturia, but otherwise feels well. He has no neurologic complaints.  He denies any recent fevers or illnesses.  He has a good appetite.  He has no chest pain, shortness of breath, cough, or hemoptysis.  He denies any nausea, vomiting, constipation, or diarrhea.  He has no other urologic complaints.  Patient offers no further specific complaints today.  REVIEW OF SYSTEMS:   Review of Systems  Constitutional:  Positive for malaise/fatigue. Negative for fever and weight loss.  Respiratory: Negative.  Negative for cough and hemoptysis.   Cardiovascular: Negative.  Negative for chest pain and leg swelling.  Gastrointestinal: Negative.  Negative for abdominal pain.  Genitourinary:  Positive for frequency. Negative for hematuria.  Musculoskeletal: Negative.  Negative for back pain.  Skin: Negative.  Negative for rash.  Neurological:  Positive for weakness. Negative for dizziness, sensory change, focal weakness and headaches.  Psychiatric/Behavioral: Negative.  The patient is not nervous/anxious.     As per HPI. Otherwise, a complete review of systems is negative.  PAST MEDICAL HISTORY: Past Medical History:  Diagnosis Date   Acquired hypothyroidism    Aortic atherosclerosis (HCC)    Atrial fibrillation (HCC)    BCC (basal cell carcinoma) 11/09/2022   left posterior upper arm, EDC 01/09/2023    Chronic constipation    CVA (cerebral vascular accident) (HCC)    DDD (degenerative disc disease), cervical    Diabetes mellitus without complication (HCC)    Diverticulosis    Dysplastic nevus 05/09/2023   mid chest - moderate   Dysplastic nevus 05/09/2023   Mid back right of midline - moderate   Elevated prostate specific antigen (PSA)    Family history of leukemia    Family history of lung cancer    History of kidney stones    History of pituitary adenoma    Hyperlipidemia    Hypertension    Hypogonadism in male    Hypothyroid    Hypothyroidism    IDA (iron  deficiency anemia)    Lumbar disc disease    Nephrolithiasis    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 Mohs.   Stroke (HCC)    throat / slight difficulty swallowing    PAST SURGICAL HISTORY: Past Surgical History:  Procedure Laterality Date   BACK SURGERY  1997   L3, 4, 5 laminectomy    BACK SURGERY  1998   hard wire removed, fusion of L3-5   BACK SURGERY  2009   laminotomy/foraminotomy w/ decompression of nerve root, facet thermal ablation L2-3.    COLONOSCOPY     COLONOSCOPY WITH PROPOFOL  N/A 02/04/2021   Procedure: COLONOSCOPY WITH PROPOFOL ;  Surgeon: Maryruth Ole DASEN, MD;  Location: [facility] ENDOSCOPY;  Service: Endoscopy;  Laterality: N/A;  STAT CBC BEFORE PROCEDURE  ESOPHAGOGASTRODUODENOSCOPY (EGD) WITH PROPOFOL  N/A 02/04/2021   Procedure: ESOPHAGOGASTRODUODENOSCOPY (EGD) WITH PROPOFOL ;  Surgeon: Maryruth Ole DASEN, MD;  Location: [facility] ENDOSCOPY;  Service: Endoscopy;  Laterality: N/A;   HERNIA REPAIR     HOLEP-LASER ENUCLEATION OF THE PROSTATE WITH MORCELLATION N/A 06/18/2020   Procedure: HOLEP-LASER ENUCLEATION OF THE PROSTATE WITH MORCELLATION;  Surgeon: Francisca Redell BROCKS, MD;  Location: [facility] ORS;  Service: Urology;   Laterality: N/A;   JOINT REPLACEMENT     left knee partial   KIDNEY STONE SURGERY  2010   KNEE SURGERY Left 2007   Arthroscopic partial knee replacement, replaced medial side    MOHS SURGERY Right 2014   squamous cell carcinoma   NECK SURGERY  2008   fusion with hardwire in place C3 through C7    pituitary adenoma  1994   benign   PROSTATE BIOPSY N/A 01/01/2020   Procedure: PROSTATE BIOPSY URO NAV FUSION;  Surgeon: Kassie Ozell SAUNDERS, MD;  Location: [facility] ORS;  Service: Urology;  Laterality: N/A;   SPINE SURGERY     SPINE SURGERY  2016   fusion L4    TONSILLECTOMY      FAMILY HISTORY: Family History  Problem Relation Age of Onset   Stroke Mother    Heart attack Father    Leukemia Sister 74   Lung cancer Brother 70    ADVANCED DIRECTIVES (Y/N):  N  HEALTH MAINTENANCE: Social History   Tobacco Use   Smoking status: Former    Passive exposure: Past   Smokeless tobacco: Never   Tobacco comments:    quit 50 years ago  Vaping Use   Vaping status: Never Used  Substance Use Topics   Alcohol use: Yes    Comment: rarely   Drug use: Never     Colonoscopy:  PAP:  Bone density:  Lipid panel:  Allergies  Allergen Reactions   Metoprolol Other (See Comments)    dizziness    Rivaroxaban Other (See Comments)    hematuria     Current Outpatient Medications  Medication Sig Dispense Refill   acetaminophen  (TYLENOL ) 500 MG tablet Take 500 mg by mouth every 6 (six) hours as needed.     apixaban  (ELIQUIS ) 5 MG TABS tablet Take 5 mg by mouth 2 (two) times daily.     B Complex-C (SUPER B COMPLEX PO) Take 1 capsule by mouth daily.      cetirizine (ZYRTEC) 10 MG tablet Take 10 mg by mouth daily as needed for allergies.     denosumab  (XGEVA ) 120 MG/1.7ML SOLN injection Inject 120 mg into the skin every 3 (three) months.     diltiazem  (CARDIZEM ) 30 MG tablet Take 30 mg by mouth daily as needed (afib episode).     glipiZIDE  (GLUCOTROL  XL) 5 MG 24 hr tablet Take 2.5 mg by mouth  daily.     hydrocortisone  (CORTEF ) 10 MG tablet Take 10 mg by mouth 2 (two) times daily.      Iron , Ferrous Sulfate , 325 (65 Fe) MG TABS Take 325 mg by mouth daily with supper.     latanoprost (XALATAN) 0.005 % ophthalmic solution Place 1 drop into both eyes at bedtime.     Leuprolide  Acetate (ELIGARD  [place]) Inject into the skin. 1 injection every 6 months.     levothyroxine  (SYNTHROID ) 50 MCG tablet Take 50 mcg by mouth daily before breakfast.     Magnesium  Bisglycinate (MAG GLYCINATE) 100 MG TABS Take 240 mg by mouth daily.     Multiple Vitamin (MULTIVITAMIN  WITH MINERALS) TABS tablet Take 1 tablet by mouth daily. One-A-Day Men's 50+     polyethylene glycol (MIRALAX  / GLYCOLAX ) 17 g packet Take 17 g by mouth daily. W/coffee     Probiotic Product (ALIGN) 4 MG CAPS Take 4 mg by mouth at bedtime.     rosuvastatin  (CRESTOR ) 10 MG tablet Take 1 tablet by mouth daily.     valsartan  (DIOVAN ) 160 MG tablet Take 160 mg by mouth 2 (two) times daily.     No current facility-administered medications for this visit.   Facility-Administered Medications Ordered in Other Visits  Medication Dose Route Frequency Provider Last Rate Last Admin   denosumab  (XGEVA ) injection 120 mg  120 mg Subcutaneous Once Hurley Blevins J, MD       leuprolide  (6 Month) (ELIGARD ) injection 45 mg  45 mg Subcutaneous Once Cherl Gorney J, MD        OBJECTIVE: Vitals:   12/17/23 1439  BP: (!) 151/77  Pulse: 62  Resp: 18  Temp: 98.6 F (37 C)  SpO2: 98%      Body mass index is 32.57 kg/m.    ECOG FS:1 - Symptomatic but completely ambulatory  General: Well-developed, well-nourished, no acute distress.  Sitting in a wheelchair. Eyes: Pink conjunctiva, anicteric sclera. HEENT: Normocephalic, moist mucous membranes. Lungs: No audible wheezing or coughing. Heart: Regular rate and rhythm. Abdomen: Soft, nontender, no obvious distention. Musculoskeletal: No edema, cyanosis, or clubbing. Neuro: Alert, answering all  questions appropriately. Cranial nerves grossly intact. Skin: No rashes or petechiae noted. Psych: Normal affect.  LAB RESULTS:  Lab Results  Component Value Date   NA 140 09/04/2023   K 3.5 09/04/2023   CL 112 (H) 09/04/2023   CO2 21 (L) 09/04/2023   GLUCOSE 97 09/04/2023   BUN 17 09/04/2023   CREATININE 1.02 09/04/2023   CALCIUM 8.6 (L) 09/04/2023   PROT 7.2 09/04/2023   ALBUMIN 4.2 09/04/2023   AST 29 09/04/2023   ALT 14 09/04/2023   ALKPHOS 62 09/04/2023   BILITOT 0.4 09/04/2023   GFRNONAA >60 09/04/2023   GFRAA >60 09/01/2020    Lab Results  Component Value Date   WBC 8.0 09/04/2023   NEUTROABS 4.9 09/04/2023   HGB 12.4 (L) 09/04/2023   HCT 38.8 (L) 09/04/2023   MCV 86.0 09/04/2023   PLT 222 09/04/2023     STUDIES: No results found.  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  Patient's PSA was initially greater than 8.0, but trended down and has been undetectable since June 2022.  His most recent laboratory result at outside laboratory work continues to reveal an undetectable PSA.  Patient discontinued Zytiga  in approximately October 2024. If his PSA increases, can reinitiate treatment.  He also wishes to transition Xgeva  to every 6 months.  Proceed with Xgeva  and Eligard  today.  Return to clinic in 3 months for laboratory work only and then in 6 months for laboratory work, further evaluation, and continuation of treatment.   Bony metastasis: Proceed with Xgeva  as above.  Patient's most recent calcium levels are adequate to proceed as scheduled.  Urinary frequency/hematuria: Chronic and unchanged.  Continue evaluation and treatment per urology.  Anemia: Mild.  Patient's most recent hemoglobin is 12.4.  He does not require additional IV Feraheme .  Colonoscopy and EGD on February 04, 2021  did not reveal any significant  pathology.  He last received 510 mg of IV Feraheme  on February 25, 2021. Weakness and fatigue: Chronic and unchanged. Atrial fibrillation: Continue follow-up with cardiology as indicated.  Patient is now on Eliquis .  Patient expressed understanding and was in agreement with this plan. He also understands that He can call clinic at any time with any questions, concerns, or complaints.    Cancer Staging  Prostate cancer [facility]) Staging form: Prostate, AJCC 8th Edition - Clinical stage from 02/17/2020: Stage IVB (cT2c, cN0, cM1b, PSA: 8.6, Grade Group: 5) - Signed by Jacobo Evalene PARAS, MD on 02/17/2020 Prostate specific antigen (PSA) range: Less than 10 Histologic grading system: 5 grade system   Evalene PARAS Jacobo, MD   12/17/2023 3:26 PM

## 2024-03-17 ENCOUNTER — Inpatient Hospital Stay: Payer: Medicare Other | Attending: Oncology

## 2024-03-17 DIAGNOSIS — C61 Malignant neoplasm of prostate: Secondary | ICD-10-CM | POA: Diagnosis not present

## 2024-03-17 DIAGNOSIS — D649 Anemia, unspecified: Secondary | ICD-10-CM | POA: Insufficient documentation

## 2024-03-17 DIAGNOSIS — D509 Iron deficiency anemia, unspecified: Secondary | ICD-10-CM

## 2024-03-17 LAB — CMP (CANCER CENTER ONLY)
ALT: 23 U/L (ref 0–44)
AST: 30 U/L (ref 15–41)
Albumin: 3.8 g/dL (ref 3.5–5.0)
Alkaline Phosphatase: 52 U/L (ref 38–126)
Anion gap: 4 — ABNORMAL LOW (ref 5–15)
BUN: 22 mg/dL (ref 8–23)
CO2: 20 mmol/L — ABNORMAL LOW (ref 22–32)
Calcium: 8.5 mg/dL — ABNORMAL LOW (ref 8.9–10.3)
Chloride: 111 mmol/L (ref 98–111)
Creatinine: 0.93 mg/dL (ref 0.61–1.24)
GFR, Estimated: >60 mL/min (ref >60)
Glucose, Bld: 159 mg/dL — ABNORMAL HIGH (ref 70–99)
Potassium: 3.8 mmol/L (ref 3.5–5.1)
Sodium: 135 mmol/L (ref 135–145)
Total Bilirubin: 0.4 mg/dL (ref 0.0–1.2)
Total Protein: 6.7 g/dL (ref 6.5–8.1)

## 2024-03-17 LAB — CBC WITH DIFFERENTIAL (CANCER CENTER ONLY)
Abs Immature Granulocytes: 0.05 10*3/uL (ref 0.00–0.07)
Basophils Absolute: 0.1 10*3/uL (ref 0.0–0.1)
Basophils Relative: 1 %
Eosinophils Absolute: 0.1 10*3/uL (ref 0.0–0.5)
Eosinophils Relative: 1 %
HCT: 40.2 % (ref 39.0–52.0)
Hemoglobin: 12.8 g/dL — ABNORMAL LOW (ref 13.0–17.0)
Immature Granulocytes: 1 %
Lymphocytes Relative: 21 %
Lymphs Abs: 2.2 10*3/uL (ref 0.7–4.0)
MCH: 27 pg (ref 26.0–34.0)
MCHC: 31.8 g/dL (ref 30.0–36.0)
MCV: 84.8 fL (ref 80.0–100.0)
Monocytes Absolute: 0.9 10*3/uL (ref 0.1–1.0)
Monocytes Relative: 8 %
Neutro Abs: 7.1 10*3/uL (ref 1.7–7.7)
Neutrophils Relative %: 68 %
Platelet Count: 208 10*3/uL (ref 150–400)
RBC: 4.74 MIL/uL (ref 4.22–5.81)
RDW: 15.5 % (ref 11.5–15.5)
WBC Count: 10.3 10*3/uL (ref 4.0–10.5)
nRBC: 0 % (ref 0.0–0.2)

## 2024-03-17 LAB — FERRITIN: Ferritin: 11 ng/mL — ABNORMAL LOW (ref 24–336)

## 2024-03-17 LAB — PSA: Prostatic Specific Antigen: <0.01 ng/mL (ref 0.00–4.00)

## 2024-03-17 LAB — IRON AND TIBC
Iron: 40 ug/dL — ABNORMAL LOW (ref 45–182)
Saturation Ratios: 12 % — ABNORMAL LOW (ref 17.9–39.5)
TIBC: 349 ug/dL (ref 250–450)
UIBC: 309 ug/dL

## 2024-03-19 ENCOUNTER — Encounter: Payer: Self-pay | Admitting: Oncology

## 2024-03-21 ENCOUNTER — Telehealth: Payer: Self-pay | Admitting: Oncology

## 2024-03-21 NOTE — Telephone Encounter (Signed)
 Pt spouse called to schedule appts. I looked at the mychart messages listed in chart review that stated MD/ iron next week. I gave spouse the opening we had and she said the time was too early, so appt I scheduled for 4/21 per pt spouse

## 2024-03-31 ENCOUNTER — Inpatient Hospital Stay

## 2024-03-31 ENCOUNTER — Encounter: Payer: Self-pay | Admitting: Oncology

## 2024-03-31 ENCOUNTER — Inpatient Hospital Stay (HOSPITAL_BASED_OUTPATIENT_CLINIC_OR_DEPARTMENT_OTHER): Admitting: Oncology

## 2024-03-31 VITALS — BP 159/86 | HR 54 | Temp 98.2°F | Resp 18 | Ht 70.0 in | Wt 240.0 lb

## 2024-03-31 VITALS — BP 172/87 | HR 48

## 2024-03-31 DIAGNOSIS — D649 Anemia, unspecified: Secondary | ICD-10-CM | POA: Diagnosis not present

## 2024-03-31 DIAGNOSIS — D509 Iron deficiency anemia, unspecified: Secondary | ICD-10-CM | POA: Diagnosis not present

## 2024-03-31 DIAGNOSIS — C61 Malignant neoplasm of prostate: Secondary | ICD-10-CM

## 2024-03-31 MED ORDER — SODIUM CHLORIDE 0.9% FLUSH
10.0000 mL | Freq: Once | INTRAVENOUS | Status: AC | PRN
  Administered 2024-03-31: 10 mL
  Filled 2024-03-31: qty 10

## 2024-03-31 MED ORDER — IRON SUCROSE 20 MG/ML IV SOLN
200.0000 mg | Freq: Once | INTRAVENOUS | Status: AC
  Administered 2024-03-31: 200 mg via INTRAVENOUS

## 2024-03-31 NOTE — Progress Notes (Signed)
 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  INTERVAL HISTORY: Patient returns to clinic today for routine 69-month evaluation and consideration of IV Venofer .  He reports increased fatigue as well as being cold.  He otherwise feels well.  He has no neurologic complaints.  He denies any recent fevers or illnesses.  He has a good appetite.  He has no chest pain, shortness of breath, cough, or hemoptysis.  He denies any nausea, vomiting, constipation, or diarrhea.  He has chronic nocturia, but no other urologic complaints.  Patient has no further specific complaints today.  REVIEW OF SYSTEMS:   Review of Systems  Constitutional:  Positive for malaise/fatigue. Negative for fever and weight loss.  Respiratory: Negative.  Negative for cough and hemoptysis.   Cardiovascular: Negative.  Negative for chest pain and leg swelling.  Gastrointestinal: Negative.  Negative for abdominal pain.  Genitourinary:  Positive for frequency. Negative for hematuria.  Musculoskeletal: Negative.  Negative for back pain.  Skin: Negative.  Negative for rash.  Neurological:  Positive for weakness. Negative for dizziness, sensory change, focal weakness and headaches.  Psychiatric/Behavioral: Negative.  The patient is not nervous/anxious.     As per HPI. Otherwise, a complete review of systems is negative.  PAST MEDICAL HISTORY: Past Medical History:  Diagnosis Date   Acquired hypothyroidism    Aortic atherosclerosis (HCC)    Atrial fibrillation (HCC)    BCC (basal cell carcinoma) 11/09/2022   left posterior upper arm, EDC 01/09/2023   Chronic constipation    CVA (cerebral vascular accident)  (HCC)    DDD (degenerative disc disease), cervical    Diabetes mellitus without complication (HCC)    Diverticulosis    Dysplastic nevus 05/09/2023   mid chest - moderate   Dysplastic nevus 05/09/2023   Mid back right of midline - moderate   Elevated prostate specific antigen (PSA)    Family history of leukemia    Family history of lung cancer    History of kidney stones    History of pituitary adenoma    Hyperlipidemia    Hypertension    Hypogonadism in male    Hypothyroid    Hypothyroidism    IDA (iron  deficiency anemia)    Lumbar disc disease    Nephrolithiasis    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 Mohs.   Stroke (HCC)    throat / slight difficulty swallowing    PAST SURGICAL HISTORY: Past Surgical History:  Procedure Laterality Date   BACK SURGERY  1997   L3, 4, 5 laminectomy    BACK SURGERY  1998   hard wire removed, fusion of L3-5   BACK SURGERY  2009   laminotomy/foraminotomy w/ decompression of nerve root, facet thermal ablation L2-3.    COLONOSCOPY     COLONOSCOPY WITH PROPOFOL  N/A 02/04/2021   Procedure: COLONOSCOPY WITH PROPOFOL ;  Surgeon: Shane Darling, MD;  Location: [facility] ENDOSCOPY;  Service: Endoscopy;  Laterality: N/A;  STAT CBC BEFORE PROCEDURE   ESOPHAGOGASTRODUODENOSCOPY (EGD) WITH PROPOFOL   N/A 02/04/2021   Procedure: ESOPHAGOGASTRODUODENOSCOPY (EGD) WITH PROPOFOL ;  Surgeon: Shane Darling, MD;  Location: [facility] ENDOSCOPY;  Service: Endoscopy;  Laterality: N/A;   HERNIA REPAIR     HOLEP-LASER ENUCLEATION OF THE PROSTATE WITH MORCELLATION N/A 06/18/2020   Procedure: HOLEP-LASER ENUCLEATION OF THE PROSTATE WITH MORCELLATION;  Surgeon: Lawerence Pressman, MD;  Location: [facility] ORS;  Service: Urology;  Laterality: N/A;   JOINT REPLACEMENT     left knee partial    KIDNEY STONE SURGERY  2010   KNEE SURGERY Left 2007   Arthroscopic partial knee replacement, replaced medial side    MOHS SURGERY Right 2014   squamous cell carcinoma   NECK SURGERY  2008   fusion with hardwire in place C3 through C7    pituitary adenoma  1994   benign   PROSTATE BIOPSY N/A 01/01/2020   Procedure: PROSTATE BIOPSY URO NAV FUSION;  Surgeon: Rea Cambridge, MD;  Location: [facility] ORS;  Service: Urology;  Laterality: N/A;   SPINE SURGERY     SPINE SURGERY  2016   fusion L4    TONSILLECTOMY      FAMILY HISTORY: Family History  Problem Relation Age of Onset   Stroke Mother    Heart attack Father    Leukemia Sister 53   Lung cancer Brother 39    ADVANCED DIRECTIVES (Y/N):  N  HEALTH MAINTENANCE: Social History   Tobacco Use   Smoking status: Former    Passive exposure: Past   Smokeless tobacco: Never   Tobacco comments:    quit 50 years ago  Vaping Use   Vaping status: Never Used  Substance Use Topics   Alcohol use: Yes    Comment: rarely   Drug use: Never     Colonoscopy:  PAP:  Bone density:  Lipid panel:  Allergies  Allergen Reactions   Metoprolol Other (See Comments)    dizziness    Rivaroxaban Other (See Comments)    hematuria     Current Outpatient Medications  Medication Sig Dispense Refill   acetaminophen  (TYLENOL ) 500 MG tablet Take 500 mg by mouth every 6 (six) hours as needed.     apixaban  (ELIQUIS ) 5 MG TABS tablet Take 5 mg by mouth 2 (two) times daily.     B Complex-C (SUPER B COMPLEX PO) Take 1 capsule by mouth daily.      cetirizine (ZYRTEC) 10 MG tablet Take 10 mg by mouth daily as needed for allergies.     diltiazem  (CARDIZEM ) 30 MG tablet Take 30 mg by mouth daily as needed (afib episode).     glipiZIDE  (GLUCOTROL  XL) 2.5 MG 24 hr tablet Take 2.5 mg by mouth daily.     hydrocortisone  (CORTEF ) 10 MG tablet Take 10 mg by mouth 2 (two) times daily.      Iron , Ferrous Sulfate, 325 (65 Fe) MG TABS Take 325 mg by mouth daily  with supper.     latanoprost (XALATAN) 0.005 % ophthalmic solution Place 1 drop into both eyes at bedtime.     Leuprolide  Acetate (ELIGARD  [place]) Inject into the skin. 1 injection every 6 months.     levothyroxine  (SYNTHROID ) 50 MCG tablet Take 50 mcg by mouth daily before breakfast.     Multiple Vitamin (MULTIVITAMIN WITH MINERALS) TABS tablet Take 1 tablet by mouth daily. One-A-Day Men's 50+     polyethylene glycol (MIRALAX  / GLYCOLAX ) 17 g packet Take 17 g by mouth daily. W/coffee     Probiotic Product (ALIGN) 4 MG  CAPS Take 4 mg by mouth at bedtime.     rosuvastatin  (CRESTOR ) 10 MG tablet Take 1 tablet by mouth daily.     valsartan  (DIOVAN ) 160 MG tablet Take 160 mg by mouth 2 (two) times daily.     No current facility-administered medications for this visit.    OBJECTIVE: Vitals:   03/31/24 1042  BP: (!) 159/86  Pulse: (!) 54  Resp: 18  Temp: 98.2 F (36.8 C)  SpO2: 97%      Body mass index is 34.44 kg/m.    ECOG FS:1 - Symptomatic but completely ambulatory  General: Well-developed, well-nourished, no acute distress.  Sitting in wheelchair. Eyes: Pink conjunctiva, anicteric sclera. HEENT: Normocephalic, moist mucous membranes. Lungs: No audible wheezing or coughing. Heart: Regular rate and rhythm. Abdomen: Soft, nontender, no obvious distention. Musculoskeletal: No edema, cyanosis, or clubbing. Neuro: Alert, answering all questions appropriately. Cranial nerves grossly intact. Skin: No rashes or petechiae noted. Psych: Normal affect.  LAB RESULTS:  Lab Results  Component Value Date   NA 135 03/17/2024   K 3.8 03/17/2024   CL 111 03/17/2024   CO2 20 (L) 03/17/2024   GLUCOSE 159 (H) 03/17/2024   BUN 22 03/17/2024   CREATININE 0.93 03/17/2024   CALCIUM 8.5 (L) 03/17/2024   PROT 6.7 03/17/2024   ALBUMIN 3.8 03/17/2024   AST 30 03/17/2024   ALT 23 03/17/2024   ALKPHOS 52 03/17/2024   BILITOT 0.4 03/17/2024   GFRNONAA >60 03/17/2024   GFRAA >60 09/01/2020    Lab  Results  Component Value Date   WBC 10.3 03/17/2024   NEUTROABS 7.1 03/17/2024   HGB 12.8 (L) 03/17/2024   HCT 40.2 03/17/2024   MCV 84.8 03/17/2024   PLT 208 03/17/2024   Lab Results  Component Value Date   IRON  40 (L) 03/17/2024   TIBC 349 03/17/2024   IRONPCTSAT 12 (L) 03/17/2024   Lab Results  Component Value Date   FERRITIN 11 (L) 03/17/2024     STUDIES: No results found.  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  Patient's PSA was initially greater than 8.0, but trended down and has been undetectable since June 2022.  His most recent laboratory result at outside laboratory work continues to reveal an undetectable PSA.  Patient discontinued Zytiga  in approximately October 2024. If his PSA increases, can reinitiate treatment.  He also wishes to transition Xgeva  to every 6 months.  He last received treatment on December 17, 2023.  Return to clinic as previously scheduled in 3 months for further evaluation and continuation of treatment.  Bony metastasis: Continue Xgeva  every 6 months as above. Urinary frequency/hematuria: Chronic and unchanged.  Continue evaluation and treatment per urology.  Anemia: Mild.  Although patient's hemoglobin is only mildly reduced at 12.8, he does have significantly reduced iron  stores and is symptomatic.  He previously received Feraheme , but we will proceed with 200 mg IV Venofer  today.  Return to clinic 3 times over the next 1 to 2 weeks for additional treatments.  Patient will then return to clinic in 3 months as above.   Weakness and fatigue: Chronic and unchanged.   Atrial fibrillation: Continue follow-up with cardiology as indicated.  Patient is now on Eliquis .  Patient expressed understanding and was in agreement with this plan. He also understands that He can call  clinic at any time  with any questions, concerns, or complaints.    Cancer Staging  Prostate cancer [facility]) Staging form: Prostate, AJCC 8th Edition - Clinical stage from 02/17/2020: Stage IVB (cT2c, cN0, cM1b, PSA: 8.6, Grade Group: 5) - Signed by Shellie Dials, MD on 02/17/2020 Prostate specific antigen (PSA) range: Less than 10 Histologic grading system: 5 grade system   Shellie Dials, MD   03/31/2024 12:32 PM

## 2024-04-03 ENCOUNTER — Inpatient Hospital Stay

## 2024-04-03 VITALS — BP 165/72 | HR 66 | Temp 97.2°F | Resp 18

## 2024-04-03 DIAGNOSIS — D649 Anemia, unspecified: Secondary | ICD-10-CM | POA: Diagnosis not present

## 2024-04-03 DIAGNOSIS — C61 Malignant neoplasm of prostate: Secondary | ICD-10-CM

## 2024-04-03 MED ORDER — IRON SUCROSE 20 MG/ML IV SOLN
200.0000 mg | Freq: Once | INTRAVENOUS | Status: AC
  Administered 2024-04-03: 200 mg via INTRAVENOUS

## 2024-04-03 NOTE — Patient Instructions (Signed)

## 2024-04-08 ENCOUNTER — Inpatient Hospital Stay

## 2024-04-08 VITALS — BP 162/80 | HR 62 | Temp 97.5°F | Resp 18

## 2024-04-08 DIAGNOSIS — D649 Anemia, unspecified: Secondary | ICD-10-CM | POA: Diagnosis not present

## 2024-04-08 DIAGNOSIS — C61 Malignant neoplasm of prostate: Secondary | ICD-10-CM

## 2024-04-08 MED ORDER — IRON SUCROSE 20 MG/ML IV SOLN
200.0000 mg | Freq: Once | INTRAVENOUS | Status: AC
  Administered 2024-04-08: 200 mg via INTRAVENOUS

## 2024-04-08 NOTE — Patient Instructions (Signed)

## 2024-04-10 ENCOUNTER — Inpatient Hospital Stay: Attending: Oncology

## 2024-04-10 VITALS — BP 165/92 | HR 71 | Temp 98.9°F | Resp 18

## 2024-04-10 DIAGNOSIS — C61 Malignant neoplasm of prostate: Secondary | ICD-10-CM

## 2024-04-10 DIAGNOSIS — D649 Anemia, unspecified: Secondary | ICD-10-CM | POA: Insufficient documentation

## 2024-04-10 MED ORDER — SODIUM CHLORIDE 0.9% FLUSH
10.0000 mL | Freq: Once | INTRAVENOUS | Status: AC | PRN
  Administered 2024-04-10: 10 mL
  Filled 2024-04-10: qty 10

## 2024-04-10 MED ORDER — IRON SUCROSE 20 MG/ML IV SOLN
200.0000 mg | Freq: Once | INTRAVENOUS | Status: AC
  Administered 2024-04-10: 200 mg via INTRAVENOUS

## 2024-05-01 ENCOUNTER — Encounter: Payer: Self-pay | Admitting: Dermatology

## 2024-05-01 ENCOUNTER — Ambulatory Visit (INDEPENDENT_AMBULATORY_CARE_PROVIDER_SITE_OTHER): Payer: PRIVATE HEALTH INSURANCE | Admitting: Dermatology

## 2024-05-01 DIAGNOSIS — L820 Inflamed seborrheic keratosis: Secondary | ICD-10-CM | POA: Diagnosis not present

## 2024-05-01 DIAGNOSIS — W908XXA Exposure to other nonionizing radiation, initial encounter: Secondary | ICD-10-CM

## 2024-05-01 DIAGNOSIS — L905 Scar conditions and fibrosis of skin: Secondary | ICD-10-CM

## 2024-05-01 DIAGNOSIS — Z86018 Personal history of other benign neoplasm: Secondary | ICD-10-CM

## 2024-05-01 DIAGNOSIS — D1801 Hemangioma of skin and subcutaneous tissue: Secondary | ICD-10-CM

## 2024-05-01 DIAGNOSIS — Z85828 Personal history of other malignant neoplasm of skin: Secondary | ICD-10-CM

## 2024-05-01 DIAGNOSIS — L84 Corns and callosities: Secondary | ICD-10-CM

## 2024-05-01 DIAGNOSIS — Z7189 Other specified counseling: Secondary | ICD-10-CM

## 2024-05-01 DIAGNOSIS — L578 Other skin changes due to chronic exposure to nonionizing radiation: Secondary | ICD-10-CM

## 2024-05-01 DIAGNOSIS — L570 Actinic keratosis: Secondary | ICD-10-CM

## 2024-05-01 DIAGNOSIS — Z1283 Encounter for screening for malignant neoplasm of skin: Secondary | ICD-10-CM | POA: Diagnosis not present

## 2024-05-01 DIAGNOSIS — L814 Other melanin hyperpigmentation: Secondary | ICD-10-CM | POA: Diagnosis not present

## 2024-05-01 DIAGNOSIS — D229 Melanocytic nevi, unspecified: Secondary | ICD-10-CM

## 2024-05-01 DIAGNOSIS — Z8589 Personal history of malignant neoplasm of other organs and systems: Secondary | ICD-10-CM

## 2024-05-01 DIAGNOSIS — L821 Other seborrheic keratosis: Secondary | ICD-10-CM

## 2024-05-01 NOTE — Patient Instructions (Addendum)

## 2024-05-01 NOTE — Progress Notes (Signed)
 Follow-Up Visit   Subjective  Andrew Callahan is a 89 y.o. male who presents for the following: Skin Cancer Screening and Full Body Skin Exam  The patient presents for Total-Body Skin Exam (TBSE) for skin cancer screening and mole check. The patient has spots, moles and lesions to be evaluated, some may be new or changing and the patient may have concern these could be cancer.  HxSCC, BCC, DN. Some spots around neck and face that get irritated.   The following portions of the chart were reviewed this encounter and updated as appropriate: medications, allergies, medical history  Review of Systems:  No other skin or systemic complaints except as noted in HPI or Assessment and Plan.  Objective  Well appearing patient in no apparent distress; mood and affect are within normal limits.  A full examination was performed including scalp, head, eyes, ears, nose, lips, neck, chest, axillae, abdomen, back, buttocks, bilateral upper extremities, bilateral lower extremities, hands, feet, fingers, toes, fingernails, and toenails. All findings within normal limits unless otherwise noted below.   Relevant physical exam findings are noted in the Assessment and Plan.  L lateral brow x 1 Erythematous thin papules/macules with gritty scale.  ant neck x 1 Erythematous stuck-on, waxy papule or plaque mid chest Dyspigmented smooth macule or patch, hypertrophic.  Assessment & Plan   SKIN CANCER SCREENING PERFORMED TODAY.  ACTINIC DAMAGE - Chronic condition, secondary to cumulative UV/sun exposure - diffuse scaly erythematous macules with underlying dyspigmentation - Recommend daily broad spectrum sunscreen SPF 30+ to sun-exposed areas, reapply every 2 hours as needed.  - Staying in the shade or wearing long sleeves, sun glasses (UVA+UVB protection) and wide brim hats (4-inch brim around the entire circumference of the hat) are also recommended for sun protection.  - Call for new or changing  lesions.  LENTIGINES, SEBORRHEIC KERATOSES, HEMANGIOMAS - Benign normal skin lesions - Benign-appearing - Call for any changes  MELANOCYTIC NEVI - Tan-brown and/or pink-flesh-colored symmetric macules and papules - Benign appearing on exam today - Observation - Call clinic for new or changing moles - Recommend daily use of broad spectrum spf 30+ sunscreen to sun-exposed areas.   HISTORY OF BASAL CELL CARCINOMA OF THE SKIN - No evidence of recurrence today - Recommend regular full body skin exams - Recommend daily broad spectrum sunscreen SPF 30+ to sun-exposed areas, reapply every 2 hours as needed.  - Call if any new or changing lesions are noted between office visits  HISTORY OF SQUAMOUS CELL CARCINOMA OF THE SKIN - No evidence of recurrence today - No lymphadenopathy - Recommend regular full body skin exams - Recommend daily broad spectrum sunscreen SPF 30+ to sun-exposed areas, reapply every 2 hours as needed.  - Call if any new or changing lesions are noted between office visits  History of Dysplastic Nevi - No evidence of recurrence today - Recommend regular full body skin exams - Recommend daily broad spectrum sunscreen SPF 30+ to sun-exposed areas, reapply every 2 hours as needed.  - Call if any new or changing lesions are noted between office visits  CALLOUS / Scar from past Mohs surgery 2014 no changes since Mohs in 2014 treated in [place] Exam: right hand, hyperkeratotic patch.  Treatment Plan: Observe for change  AK (ACTINIC KERATOSIS) L lateral brow x 1 Actinic keratoses are precancerous spots that appear secondary to cumulative UV radiation exposure/sun exposure over time. They are chronic with expected duration over 1 year. A portion of actinic keratoses will progress to  squamous cell carcinoma of the skin. It is not possible to reliably predict which spots will progress to skin cancer and so treatment is recommended to prevent development of skin  cancer.  Recommend daily broad spectrum sunscreen SPF 30+ to sun-exposed areas, reapply every 2 hours as needed.  Recommend staying in the shade or wearing long sleeves, sun glasses (UVA+UVB protection) and wide brim hats (4-inch brim around the entire circumference of the hat). Call for new or changing lesions. Destruction of lesion - L lateral brow x 1 Complexity: simple   Destruction method: cryotherapy   Informed consent: discussed and consent obtained   Timeout:  patient name, date of birth, surgical site, and procedure verified Lesion destroyed using liquid nitrogen: Yes   Region frozen until ice ball extended beyond lesion: Yes   Outcome: patient tolerated procedure well with no complications   Post-procedure details: wound care instructions given   INFLAMED SEBORRHEIC KERATOSIS ant neck x 1 Symptomatic, irritating, patient would like treated.  Benign-appearing.  Call clinic for new or changing lesions.   Destruction of lesion - ant neck x 1 Complexity: simple   Destruction method: cryotherapy   Informed consent: discussed and consent obtained   Timeout:  patient name, date of birth, surgical site, and procedure verified Lesion destroyed using liquid nitrogen: Yes   Region frozen until ice ball extended beyond lesion: Yes   Outcome: patient tolerated procedure well with no complications   Post-procedure details: wound care instructions given   SCAR mid chest Exam: hypertrophic scar.  Hx of biopsy 04/2023 that showed dysplastic nevus with moderate atypia   Return in about 1 year (around 05/01/2025) for TBSE, with Dr. Linnell Richardson, HxBCC, HxSCC, HxDN.  Kerstin Peeling, RMA, am acting as scribe for Celine Collard, MD .   Documentation: I have reviewed the above documentation for accuracy and completeness, and I agree with the above.  Celine Collard, MD

## 2024-05-08 ENCOUNTER — Ambulatory Visit: Payer: PRIVATE HEALTH INSURANCE | Admitting: Dermatology

## 2024-06-02 ENCOUNTER — Encounter: Payer: Self-pay | Admitting: Oncology

## 2024-06-09 ENCOUNTER — Ambulatory Visit (INDEPENDENT_AMBULATORY_CARE_PROVIDER_SITE_OTHER): Admitting: Urology

## 2024-06-09 VITALS — BP 171/103 | HR 52 | Ht 70.0 in | Wt 240.0 lb

## 2024-06-09 DIAGNOSIS — R351 Nocturia: Secondary | ICD-10-CM | POA: Diagnosis not present

## 2024-06-09 DIAGNOSIS — N3281 Overactive bladder: Secondary | ICD-10-CM | POA: Diagnosis not present

## 2024-06-09 LAB — BLADDER SCAN AMB NON-IMAGING: Scan Result: 35

## 2024-06-09 NOTE — Progress Notes (Signed)
   06/09/2024 12:06 PM   Reason Helzer 03-29-1934 [id number]  Reason for visit: Follow up metastatic prostate cancer, nocturia, urinary frequency, OAB  HPI: 89 year old male with history of stroke and metastatic prostate cancer managed by oncology who originally presented with urinary retention and elevated PVRs.  With his metastatic prostate cancer he opted for a HOLEP in July 2021 to get out of retention, and systemic treatment for his widely metastatic disease with significant bony mets.  He is followed by oncology and remains on ADT.  PSA remains undetectable, most recently 03/17/2024.  Notably, he had significant nocturia prior to undergoing any surgery or treatment for prostate cancer, with nocturia 4-5 times per night.  He has nocturia originally started after his stroke.   He has been urinating with a strong stream and emptying his bladder well with low PVRs since that time, but continues to have some frequency during the day every 2-3 hours, and nocturia 3-6 times overnight(every 2 hours overnight).  He was having nocturia 4-5 times overnight prior to undergoing HOLEP.  He is not having any incontinence.  He has had a cystoscopy which was normal and showed a wide open prostatic fossa and intact sphincter with no bladder lesions.  Recent urinalysis in December 2024 was completely benign.  He is compliant with CPAP for sleep apnea.  He continues to drink fluids in the afternoon and even overnight when he wakes up to pee.  He continues to have tea and soda in the evenings.   We have tried numerous strategies for his overactive symptoms and nocturia including bladder diaries, anticholinergic OAB medications, both beta 3 agonist Myrbetriq  and vibegron , desmopressin  overnight, and most recently PTNS.   He also tried a condom catheter overnight but this kept falling off.  He has really had no significant improvement with any of these strategies.  We have also previously discussed Botox, very low  suspicion this would offer him any significant improvement, and risk of retention he defers.   They opted to pursue urodynamics.  I personally viewed and interpreted the urodynamic findings from [facility] in [place] showing a max capacity of , a few unstable contractions were noted, but he had minimal urge incontinence.  No leakage was noted with abdominal pressure of 60 cm of water.  Flow pattern was unobstructed  Despite our multiple conversations previously about behavioral strategies and bladder irritants, he continues to drink fluids in the evening, including tea in the afternoon, diet soda, and coffee in the morning and afternoon, as well as fluids overnight.   Again, with his metastatic prostate cancer and history of stroke, as well as long history of nocturia 4-5 times overnight prior to prostate cancer diagnosis or any treatments, I think we need to have realistic expectations regarding the OAB component of his urinary symptoms.  He is now no longer catheter dependent and emptying well.  Reassurance was again provided regarding his urination every 3 hours during the day, which really is very normal and reasonable especially for someone with his comorbidities.    Avoid bladder irritants, RTC 1 year, need to have realistic expectations   Redell JAYSON Burnet, MD [facility] [address] [place], [place] [address] [phone]

## 2024-06-09 NOTE — Patient Instructions (Signed)

## 2024-06-16 ENCOUNTER — Ambulatory Visit: Payer: Medicare Other

## 2024-06-16 ENCOUNTER — Other Ambulatory Visit: Payer: Medicare Other

## 2024-06-16 ENCOUNTER — Ambulatory Visit: Payer: Medicare Other | Admitting: Oncology

## 2024-06-16 ENCOUNTER — Ambulatory Visit

## 2024-06-17 ENCOUNTER — Encounter: Payer: Self-pay | Admitting: Oncology

## 2024-06-17 ENCOUNTER — Other Ambulatory Visit

## 2024-06-17 ENCOUNTER — Inpatient Hospital Stay: Attending: Oncology | Admitting: Oncology

## 2024-06-17 ENCOUNTER — Inpatient Hospital Stay

## 2024-06-17 VITALS — BP 144/77 | HR 64 | Temp 98.1°F | Resp 18 | Ht 70.0 in | Wt 237.0 lb

## 2024-06-17 DIAGNOSIS — C61 Malignant neoplasm of prostate: Secondary | ICD-10-CM

## 2024-06-17 DIAGNOSIS — Z5111 Encounter for antineoplastic chemotherapy: Secondary | ICD-10-CM | POA: Diagnosis present

## 2024-06-17 DIAGNOSIS — C7951 Secondary malignant neoplasm of bone: Secondary | ICD-10-CM | POA: Diagnosis present

## 2024-06-17 DIAGNOSIS — Z79899 Other long term (current) drug therapy: Secondary | ICD-10-CM | POA: Insufficient documentation

## 2024-06-17 MED ORDER — LEUPROLIDE ACETATE (6 MONTH) 45 MG ~~LOC~~ KIT
45.0000 mg | PACK | Freq: Once | SUBCUTANEOUS | Status: AC
  Administered 2024-06-17: 45 mg via SUBCUTANEOUS
  Filled 2024-06-17: qty 45

## 2024-06-17 MED ORDER — DENOSUMAB 120 MG/1.7ML ~~LOC~~ SOLN
120.0000 mg | Freq: Once | SUBCUTANEOUS | Status: AC
  Administered 2024-06-17: 120 mg via SUBCUTANEOUS
  Filled 2024-06-17: qty 1.7

## 2024-06-17 NOTE — Progress Notes (Signed)
  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  INTERVAL HISTORY: Patient returns to clinic today for routine evaluation and continuation of Eligard  and Xgeva .  He continues to have chronic fatigue as well as nocturia, but otherwise feels well.  He has no neurologic complaints.  He denies any recent fevers or illnesses.  He has a good appetite.  He has no chest pain, shortness of breath, cough, or hemoptysis.  He denies any nausea, vomiting, constipation, or diarrhea.  He has no other urinary complaints.  Patient offers no further specific complaints today.    REVIEW OF SYSTEMS:   Review of Systems  Constitutional:  Positive for malaise/fatigue. Negative for fever and weight loss.  Respiratory: Negative.  Negative for cough and hemoptysis.   Cardiovascular: Negative.  Negative for chest pain and leg swelling.  Gastrointestinal: Negative.  Negative for abdominal pain.  Genitourinary:  Positive for frequency. Negative for hematuria.  Musculoskeletal: Negative.  Negative for back pain.  Skin: Negative.  Negative for rash.  Neurological:  Positive for weakness. Negative for dizziness, sensory change, focal weakness and headaches.  Psychiatric/Behavioral: Negative.  The patient is not nervous/anxious.     As per HPI. Otherwise, a complete review of systems is negative.  PAST MEDICAL HISTORY: Past Medical History:  Diagnosis Date   Acquired hypothyroidism    Aortic atherosclerosis (HCC)    Atrial fibrillation (HCC)    BCC (basal cell carcinoma) 11/09/2022   left posterior upper arm, EDC 01/09/2023   Chronic constipation    CVA (cerebral vascular accident) (HCC)    DDD  (degenerative disc disease), cervical    Diabetes mellitus without complication (HCC)    Diverticulosis    Dysplastic nevus 05/09/2023   mid chest - moderate   Dysplastic nevus 05/09/2023   Mid back right of midline - moderate   Elevated prostate specific antigen (PSA)    Family history of leukemia    Family history of lung cancer    History of kidney stones    History of pituitary adenoma    Hyperlipidemia    Hypertension    Hypogonadism in male    Hypothyroid    Hypothyroidism    IDA (iron  deficiency anemia)    Lumbar disc disease    Nephrolithiasis    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 Mohs.   Stroke (HCC)    throat / slight difficulty swallowing    PAST SURGICAL HISTORY: Past Surgical History:  Procedure Laterality Date   BACK SURGERY  1997   L3, 4, 5 laminectomy    BACK SURGERY  1998   hard wire removed, fusion of L3-5   BACK SURGERY  2009   laminotomy/foraminotomy w/ decompression of nerve root, facet thermal ablation L2-3.    COLONOSCOPY     COLONOSCOPY WITH PROPOFOL  N/A 02/04/2021   Procedure: COLONOSCOPY WITH PROPOFOL ;  Surgeon: Maryruth Ole DASEN, MD;  Location: [facility] ENDOSCOPY;  Service: Endoscopy;  Laterality: N/A;  STAT CBC BEFORE PROCEDURE   ESOPHAGOGASTRODUODENOSCOPY (EGD) WITH PROPOFOL  N/A  02/04/2021   Procedure: ESOPHAGOGASTRODUODENOSCOPY (EGD) WITH PROPOFOL ;  Surgeon: Maryruth Ole DASEN, MD;  Location: [facility] ENDOSCOPY;  Service: Endoscopy;  Laterality: N/A;   HERNIA REPAIR     HOLEP-LASER ENUCLEATION OF THE PROSTATE WITH MORCELLATION N/A 06/18/2020   Procedure: HOLEP-LASER ENUCLEATION OF THE PROSTATE WITH MORCELLATION;  Surgeon: Francisca Redell BROCKS, MD;  Location: [facility] ORS;  Service: Urology;  Laterality: N/A;   JOINT REPLACEMENT     left knee partial   KIDNEY  STONE SURGERY  2010   KNEE SURGERY Left 2007   Arthroscopic partial knee replacement, replaced medial side    MOHS SURGERY Right 2014   squamous cell carcinoma   NECK SURGERY  2008   fusion with hardwire in place C3 through C7    pituitary adenoma  1994   benign   PROSTATE BIOPSY N/A 01/01/2020   Procedure: PROSTATE BIOPSY URO NAV FUSION;  Surgeon: Kassie Ozell SAUNDERS, MD;  Location: [facility] ORS;  Service: Urology;  Laterality: N/A;   SPINE SURGERY     SPINE SURGERY  2016   fusion L4    TONSILLECTOMY      FAMILY HISTORY: Family History  Problem Relation Age of Onset   Stroke Mother    Heart attack Father    Leukemia Sister 30   Lung cancer Brother 55    ADVANCED DIRECTIVES (Y/N):  N  HEALTH MAINTENANCE: Social History   Tobacco Use   Smoking status: Former    Passive exposure: Past   Smokeless tobacco: Never   Tobacco comments:    quit 50 years ago  Vaping Use   Vaping status: Never Used  Substance Use Topics   Alcohol use: Yes    Comment: rarely   Drug use: Never     Colonoscopy:  PAP:  Bone density:  Lipid panel:  Allergies  Allergen Reactions   Metoprolol Other (See Comments)    dizziness    Other Other (See Comments)    Live oak trees SNEEZING, RUNNING NOSE   Rivaroxaban Other (See Comments)    hematuria     Current Outpatient Medications  Medication Sig Dispense Refill   acetaminophen  (TYLENOL ) 500 MG tablet Take 500 mg by mouth every 6 (six) hours as needed.     apixaban  (ELIQUIS ) 5 MG TABS tablet Take 5 mg by mouth 2 (two) times daily.     B Complex-C (SUPER B COMPLEX PO) Take 1 capsule by mouth daily.      cetirizine (ZYRTEC) 10 MG tablet Take 10 mg by mouth daily as needed for allergies.     diltiazem  (CARDIZEM ) 30 MG tablet Take 30 mg by mouth daily as needed (afib episode).     glipiZIDE  (GLUCOTROL  XL) 2.5 MG 24 hr tablet Take 2.5 mg by mouth daily. (Patient taking differently: Take 5 mg by mouth daily.)     hydrocortisone  (CORTEF ) 10 MG  tablet Take 10 mg by mouth 2 (two) times daily.      Iron , Ferrous Sulfate, 325 (65 Fe) MG TABS Take 325 mg by mouth daily with supper.     latanoprost (XALATAN) 0.005 % ophthalmic solution Place 1 drop into both eyes at bedtime.     Leuprolide  Acetate (ELIGARD  [place]) Inject into the skin. 1 injection every 6 months.     levothyroxine  (SYNTHROID ) 50 MCG tablet Take 50 mcg by mouth daily before breakfast.     Multiple Vitamin (MULTIVITAMIN WITH MINERALS) TABS tablet Take 1 tablet by mouth daily. One-A-Day Men's 50+     polyethylene  glycol (MIRALAX  / GLYCOLAX ) 17 g packet Take 17 g by mouth daily. W/coffee     Probiotic Product (ALIGN) 4 MG CAPS Take 4 mg by mouth at bedtime.     rosuvastatin  (CRESTOR ) 10 MG tablet Take 1 tablet by mouth daily.     traZODone  (DESYREL ) 50 MG tablet Take 50 mg by mouth at bedtime.     valsartan  (DIOVAN ) 160 MG tablet Take 160 mg by mouth 2 (two) times daily.     No current facility-administered medications for this visit.   Facility-Administered Medications Ordered in Other Visits  Medication Dose Route Frequency Provider Last Rate Last Admin   denosumab  (XGEVA ) injection 120 mg  120 mg Subcutaneous Once Tramon Crescenzo J, MD       leuprolide  (6 Month) (ELIGARD ) injection 45 mg  45 mg Subcutaneous Once Umair Rosiles J, MD        OBJECTIVE: Vitals:   06/17/24 1404  BP: (!) 144/77  Pulse: 64  Resp: 18  Temp: 98.1 F (36.7 C)  SpO2: 98%      Body mass index is 34.01 kg/m.    ECOG FS:0 - Asymptomatic  General: Well-developed, well-nourished, no acute distress. Eyes: Pink conjunctiva, anicteric sclera. HEENT: Normocephalic, moist mucous membranes. Lungs: No audible wheezing or coughing. Heart: Regular rate and rhythm. Abdomen: Soft, nontender, no obvious distention. Musculoskeletal: No edema, cyanosis, or clubbing. Neuro: Alert, answering all questions appropriately. Cranial nerves grossly intact. Skin: No rashes or petechiae noted. Psych: Normal  affect.  LAB RESULTS:  Lab Results  Component Value Date   NA 135 03/17/2024   K 3.8 03/17/2024   CL 111 03/17/2024   CO2 20 (L) 03/17/2024   GLUCOSE 159 (H) 03/17/2024   BUN 22 03/17/2024   CREATININE 0.93 03/17/2024   CALCIUM 8.5 (L) 03/17/2024   PROT 6.7 03/17/2024   ALBUMIN 3.8 03/17/2024   AST 30 03/17/2024   ALT 23 03/17/2024   ALKPHOS 52 03/17/2024   BILITOT 0.4 03/17/2024   GFRNONAA >60 03/17/2024   GFRAA >60 09/01/2020    Lab Results  Component Value Date   WBC 10.3 03/17/2024   NEUTROABS 7.1 03/17/2024   HGB 12.8 (L) 03/17/2024   HCT 40.2 03/17/2024   MCV 84.8 03/17/2024   PLT 208 03/17/2024   Lab Results  Component Value Date   IRON  40 (L) 03/17/2024   TIBC 349 03/17/2024   IRONPCTSAT 12 (L) 03/17/2024   Lab Results  Component Value Date   FERRITIN 11 (L) 03/17/2024     STUDIES: No results found.  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  Patient's PSA was initially greater than 8.0, but trended down and has been undetectable since June 2022.  His most recent laboratory result at outside laboratory work continues to reveal an undetectable PSA.  Patient discontinued Zytiga  in approximately October 2024. If his PSA increases, can reinitiate treatment.  Proceed with Eligard  and Xgeva  today.  Return to clinic in 6 months for further evaluation and continuation of treatment.   Bony metastasis: Continue Xgeva  every 6 months as above. Urinary frequency/hematuria: Chronic and unchanged.  Continue evaluation and treatment per urology.  Anemia: Resolved.  Patient's hemoglobin is greater than 13.0.  He does not require additional IV Venofer  today.  Patient last received treatment on Apr 10, 2024.  Return to clinic in 6 months as above.  Labs completed at outside physicians office.     Weakness and fatigue: Chronic and unchanged.   Atrial fibrillation: Continue follow-up with cardiology as indicated.  Patient is now on Eliquis .  Patient expressed understanding and was in agreement with this plan. He also understands that He can call clinic at any time with any questions, concerns, or complaints.    Cancer Staging  Prostate cancer [facility]) Staging form: Prostate, AJCC 8th Edition - Clinical stage from 02/17/2020: Stage IVB (cT2c, cN0, cM1b, PSA: 8.6, Grade Group: 5) - Signed by Jacobo Evalene PARAS, MD on 02/17/2020 Prostate specific antigen (PSA) range: Less than 10 Histologic grading system: 5 grade system   Evalene PARAS Jacobo, MD   06/17/2024 2:53 PM

## 2024-10-08 ENCOUNTER — Inpatient Hospital Stay
Admission: EM | Admit: 2024-10-08 | Discharge: 2024-10-10 | DRG: 194 | Disposition: A | Discharge status: Home Health | Attending: Internal Medicine | Admitting: Internal Medicine

## 2024-10-08 ENCOUNTER — Emergency Department

## 2024-10-08 ENCOUNTER — Other Ambulatory Visit: Payer: Self-pay

## 2024-10-08 DIAGNOSIS — E039 Hypothyroidism, unspecified: Secondary | ICD-10-CM | POA: Diagnosis not present

## 2024-10-08 DIAGNOSIS — J189 Pneumonia, unspecified organism: Secondary | ICD-10-CM | POA: Diagnosis not present

## 2024-10-08 DIAGNOSIS — C7951 Secondary malignant neoplasm of bone: Secondary | ICD-10-CM | POA: Diagnosis present

## 2024-10-08 DIAGNOSIS — Z888 Allergy status to other drugs, medicaments and biological substances status: Secondary | ICD-10-CM

## 2024-10-08 DIAGNOSIS — R3915 Urgency of urination: Secondary | ICD-10-CM | POA: Diagnosis present

## 2024-10-08 DIAGNOSIS — Z85828 Personal history of other malignant neoplasm of skin: Secondary | ICD-10-CM

## 2024-10-08 DIAGNOSIS — K8020 Calculus of gallbladder without cholecystitis without obstruction: Secondary | ICD-10-CM | POA: Diagnosis present

## 2024-10-08 DIAGNOSIS — W19XXXA Unspecified fall, initial encounter: Secondary | ICD-10-CM | POA: Insufficient documentation

## 2024-10-08 DIAGNOSIS — Z8673 Personal history of transient ischemic attack (TIA), and cerebral infarction without residual deficits: Secondary | ICD-10-CM | POA: Diagnosis not present

## 2024-10-08 DIAGNOSIS — Z79899 Other long term (current) drug therapy: Secondary | ICD-10-CM

## 2024-10-08 DIAGNOSIS — Y92009 Unspecified place in unspecified non-institutional (private) residence as the place of occurrence of the external cause: Secondary | ICD-10-CM

## 2024-10-08 DIAGNOSIS — Z87891 Personal history of nicotine dependence: Secondary | ICD-10-CM

## 2024-10-08 DIAGNOSIS — R531 Weakness: Secondary | ICD-10-CM | POA: Diagnosis not present

## 2024-10-08 DIAGNOSIS — E230 Hypopituitarism: Secondary | ICD-10-CM | POA: Diagnosis not present

## 2024-10-08 DIAGNOSIS — Z7984 Long term (current) use of oral hypoglycemic drugs: Secondary | ICD-10-CM

## 2024-10-08 DIAGNOSIS — Z1152 Encounter for screening for COVID-19: Secondary | ICD-10-CM

## 2024-10-08 DIAGNOSIS — I110 Hypertensive heart disease with heart failure: Secondary | ICD-10-CM | POA: Diagnosis present

## 2024-10-08 DIAGNOSIS — Z7901 Long term (current) use of anticoagulants: Secondary | ICD-10-CM

## 2024-10-08 DIAGNOSIS — Z9109 Other allergy status, other than to drugs and biological substances: Secondary | ICD-10-CM

## 2024-10-08 DIAGNOSIS — E669 Obesity, unspecified: Secondary | ICD-10-CM | POA: Diagnosis present

## 2024-10-08 DIAGNOSIS — Z66 Do not resuscitate: Secondary | ICD-10-CM | POA: Diagnosis present

## 2024-10-08 DIAGNOSIS — I452 Bifascicular block: Secondary | ICD-10-CM | POA: Diagnosis present

## 2024-10-08 DIAGNOSIS — J188 Other pneumonia, unspecified organism: Secondary | ICD-10-CM | POA: Diagnosis present

## 2024-10-08 DIAGNOSIS — I10 Essential (primary) hypertension: Secondary | ICD-10-CM | POA: Diagnosis present

## 2024-10-08 DIAGNOSIS — E2740 Unspecified adrenocortical insufficiency: Secondary | ICD-10-CM | POA: Diagnosis not present

## 2024-10-08 DIAGNOSIS — Z79890 Hormone replacement therapy (postmenopausal): Secondary | ICD-10-CM

## 2024-10-08 DIAGNOSIS — K5909 Other constipation: Secondary | ICD-10-CM | POA: Diagnosis present

## 2024-10-08 DIAGNOSIS — C61 Malignant neoplasm of prostate: Secondary | ICD-10-CM | POA: Diagnosis present

## 2024-10-08 DIAGNOSIS — E66812 Obesity, class 2: Secondary | ICD-10-CM | POA: Diagnosis present

## 2024-10-08 DIAGNOSIS — K5730 Diverticulosis of large intestine without perforation or abscess without bleeding: Secondary | ICD-10-CM | POA: Diagnosis present

## 2024-10-08 DIAGNOSIS — Z823 Family history of stroke: Secondary | ICD-10-CM

## 2024-10-08 DIAGNOSIS — Z6835 Body mass index (BMI) 35.0-35.9, adult: Secondary | ICD-10-CM

## 2024-10-08 DIAGNOSIS — Y92002 Bathroom of unspecified non-institutional (private) residence single-family (private) house as the place of occurrence of the external cause: Secondary | ICD-10-CM

## 2024-10-08 DIAGNOSIS — E1142 Type 2 diabetes mellitus with diabetic polyneuropathy: Secondary | ICD-10-CM | POA: Diagnosis present

## 2024-10-08 DIAGNOSIS — Z8249 Family history of ischemic heart disease and other diseases of the circulatory system: Secondary | ICD-10-CM

## 2024-10-08 DIAGNOSIS — G4733 Obstructive sleep apnea (adult) (pediatric): Secondary | ICD-10-CM | POA: Diagnosis present

## 2024-10-08 DIAGNOSIS — E785 Hyperlipidemia, unspecified: Secondary | ICD-10-CM | POA: Diagnosis present

## 2024-10-08 DIAGNOSIS — I700 Atherosclerosis of aorta: Secondary | ICD-10-CM | POA: Diagnosis present

## 2024-10-08 DIAGNOSIS — Z7952 Long term (current) use of systemic steroids: Secondary | ICD-10-CM

## 2024-10-08 DIAGNOSIS — Z87442 Personal history of urinary calculi: Secondary | ICD-10-CM

## 2024-10-08 DIAGNOSIS — D509 Iron deficiency anemia, unspecified: Secondary | ICD-10-CM | POA: Diagnosis present

## 2024-10-08 DIAGNOSIS — I480 Paroxysmal atrial fibrillation: Secondary | ICD-10-CM | POA: Diagnosis present

## 2024-10-08 DIAGNOSIS — E876 Hypokalemia: Secondary | ICD-10-CM | POA: Diagnosis present

## 2024-10-08 DIAGNOSIS — Z96652 Presence of left artificial knee joint: Secondary | ICD-10-CM | POA: Diagnosis present

## 2024-10-08 DIAGNOSIS — W1839XA Other fall on same level, initial encounter: Secondary | ICD-10-CM | POA: Diagnosis present

## 2024-10-08 LAB — URINALYSIS, W/ REFLEX TO CULTURE (INFECTION SUSPECTED)
Bilirubin Urine: NEGATIVE
Glucose, UA: NEGATIVE mg/dL
Hgb urine dipstick: NEGATIVE
Ketones, ur: NEGATIVE mg/dL
Leukocytes,Ua: NEGATIVE
Nitrite: NEGATIVE
Protein, ur: NEGATIVE mg/dL
Specific Gravity, Urine: 1.019 (ref 1.005–1.030)
pH: 5 (ref 5.0–8.0)

## 2024-10-08 LAB — MAGNESIUM: Magnesium: 2.1 mg/dL (ref 1.7–2.4)

## 2024-10-08 LAB — COMPREHENSIVE METABOLIC PANEL WITH GFR
ALT: 22 U/L (ref 0–44)
AST: 34 U/L (ref 15–41)
Albumin: 4.2 g/dL (ref 3.5–5.0)
Alkaline Phosphatase: 60 U/L (ref 38–126)
Anion gap: 12 (ref 5–15)
BUN: 18 mg/dL (ref 8–23)
CO2: 23 mmol/L (ref 22–32)
Calcium: 8.9 mg/dL (ref 8.9–10.3)
Chloride: 106 mmol/L (ref 98–111)
Creatinine, Ser: 1 mg/dL (ref 0.61–1.24)
GFR, Estimated: >60 mL/min (ref >60)
Glucose, Bld: 109 mg/dL — ABNORMAL HIGH (ref 70–99)
Potassium: 3.8 mmol/L (ref 3.5–5.1)
Sodium: 141 mmol/L (ref 135–145)
Total Bilirubin: 0.6 mg/dL (ref 0.0–1.2)
Total Protein: 7.6 g/dL (ref 6.5–8.1)

## 2024-10-08 LAB — CBC WITH DIFFERENTIAL/PLATELET
Abs Immature Granulocytes: 0.07 K/uL (ref 0.00–0.07)
Basophils Absolute: 0 K/uL (ref 0.0–0.1)
Basophils Relative: 0 %
Eosinophils Absolute: 0.1 K/uL (ref 0.0–0.5)
Eosinophils Relative: 1 %
HCT: 43.8 % (ref 39.0–52.0)
Hemoglobin: 13.4 g/dL (ref 13.0–17.0)
Immature Granulocytes: 1 %
Lymphocytes Relative: 13 %
Lymphs Abs: 1.9 K/uL (ref 0.7–4.0)
MCH: 25.7 pg — ABNORMAL LOW (ref 26.0–34.0)
MCHC: 30.6 g/dL (ref 30.0–36.0)
MCV: 83.9 fL (ref 80.0–100.0)
Monocytes Absolute: 1 K/uL (ref 0.1–1.0)
Monocytes Relative: 7 %
Neutro Abs: 11.9 K/uL — ABNORMAL HIGH (ref 1.7–7.7)
Neutrophils Relative %: 78 %
Platelets: 211 K/uL (ref 150–400)
RBC: 5.22 MIL/uL (ref 4.22–5.81)
RDW: 16.1 % — ABNORMAL HIGH (ref 11.5–15.5)
WBC: 15 K/uL — ABNORMAL HIGH (ref 4.0–10.5)
nRBC: 0 % (ref 0.0–0.2)

## 2024-10-08 LAB — GLUCOSE, CAPILLARY: Glucose-Capillary: 115 mg/dL — ABNORMAL HIGH (ref 70–99)

## 2024-10-08 LAB — CBG MONITORING, ED: Glucose-Capillary: 113 mg/dL — ABNORMAL HIGH (ref 70–99)

## 2024-10-08 LAB — LIPASE, BLOOD: Lipase: 22 U/L (ref 11–51)

## 2024-10-08 MED ORDER — IOHEXOL 350 MG/ML SOLN
100.0000 mL | Freq: Once | INTRAVENOUS | Status: AC | PRN
Start: 2024-10-08 — End: 2024-10-08
  Administered 2024-10-08: 100 mL via INTRAVENOUS

## 2024-10-08 MED ORDER — INSULIN ASPART 100 UNIT/ML IJ SOLN
0.0000 [IU] | Freq: Three times a day (TID) | INTRAMUSCULAR | Status: DC
  Administered 2024-10-09 (×2): 3 [IU] via SUBCUTANEOUS
  Administered 2024-10-09: 2 [IU] via SUBCUTANEOUS
  Administered 2024-10-10: 1 [IU] via SUBCUTANEOUS
  Administered 2024-10-10: 3 [IU] via SUBCUTANEOUS
  Filled 2024-10-08 (×5): qty 1

## 2024-10-08 MED ORDER — ACETAMINOPHEN 325 MG PO TABS: 650.0000 mg | ORAL_TABLET | Freq: Four times a day (QID) | ORAL | Status: DC | PRN

## 2024-10-08 MED ORDER — DIPHENHYDRAMINE HCL 50 MG/ML IJ SOLN: 12.5000 mg | Freq: Three times a day (TID) | INTRAMUSCULAR | Status: DC | PRN

## 2024-10-08 MED ORDER — HYDRALAZINE HCL 20 MG/ML IJ SOLN
5.0000 mg | INTRAMUSCULAR | Status: DC | PRN
  Administered 2024-10-09: 5 mg via INTRAVENOUS
  Filled 2024-10-08 (×3): qty 1

## 2024-10-08 MED ORDER — INSULIN ASPART 100 UNIT/ML IJ SOLN: 0.0000 [IU] | Freq: Every day | INTRAMUSCULAR | Status: DC

## 2024-10-08 MED ORDER — SODIUM CHLORIDE 0.9 % IV SOLN
500.0000 mg | Freq: Once | INTRAVENOUS | Status: DC
  Filled 2024-10-08: qty 5

## 2024-10-08 MED ORDER — MENTHOL 3 MG MT LOZG
1.0000 | LOZENGE | OROMUCOSAL | Status: DC | PRN
Start: 2024-10-08 — End: 2024-10-10

## 2024-10-08 MED ORDER — DM-GUAIFENESIN ER 30-600 MG PO TB12: 1.0000 | ORAL_TABLET | Freq: Two times a day (BID) | ORAL | Status: DC | PRN

## 2024-10-08 MED ORDER — ALBUTEROL SULFATE (2.5 MG/3ML) 0.083% IN NEBU: 2.5000 mg | INHALATION_SOLUTION | RESPIRATORY_TRACT | Status: DC | PRN

## 2024-10-08 MED ORDER — SODIUM CHLORIDE 0.9 % IV SOLN
2.0000 g | Freq: Once | INTRAVENOUS | Status: AC
  Administered 2024-10-08: 2 g via INTRAVENOUS
  Filled 2024-10-08: qty 20

## 2024-10-08 MED ORDER — SODIUM CHLORIDE 0.9 % IV SOLN
100.0000 mg | Freq: Two times a day (BID) | INTRAVENOUS | Status: DC
  Administered 2024-10-09 (×2): 100 mg via INTRAVENOUS
  Filled 2024-10-08 (×2): qty 100

## 2024-10-08 NOTE — ED Notes (Signed)
 Pt taken to CT.

## 2024-10-08 NOTE — ED Provider Notes (Signed)
 [facility] Provider Note    Event Date/Time   First MD Initiated Contact with Patient 10/08/24 1932     (approximate)   History   Fall   HPI  Andrew Callahan is a 89 year old male with history of prostate cancer, prior CVA, T2DM, CHF, A-fib presenting to the emergency department for evaluation of weakness.  EMS reports that over the past few days patient has had progressive weakness.  Normally able to ambulate on his own.  Wife later provides additional history.  She reports that when patient went to the bathroom he went to sit on the toilet, missed and landed on his bottom.  No head strike.  Was unable to get up by himself due to weakness.  To get patient up and was unsuccessful.  EMS was also unable to get patient up.  Does note that patient has had worsening confusion recently.  No significant cough, but has been reporting that his throat is dry and using lozenges more frequently.    Physical Exam   Triage Vital Signs: ED Triage Vitals  Encounter Vitals Group     BP 10/08/24 1942 (!) 157/89     Girls Systolic BP Percentile --      Girls Diastolic BP Percentile --      Boys Systolic BP Percentile --      Boys Diastolic BP Percentile --      Pulse Rate 10/08/24 1942 86     Resp --      Temp 10/08/24 1942 98.4 F (36.9 C)     Temp Source 10/08/24 1942 Oral     SpO2 10/08/24 1942 96 %     Weight 10/08/24 1944 246 lb 14.4 oz (112 kg)     Height 10/08/24 1944 5' 10 (1.778 m)     Head Circumference --      Peak Flow --      Pain Score 10/08/24 1949 1     Pain Loc --      Pain Education --      Exclude from Growth Chart --     Most recent vital signs: Vitals:   10/08/24 2308 10/08/24 2311  BP: (!) 162/83 (!) 178/72  Pulse: 82 82  Resp:    Temp: 99.3 F (37.4 C)   SpO2: 98% 97%     General: Awake, interactive  CV:  Good peripheral perfusion Resp:  Unlabored respirations, lungs clear to auscultation Abd:  Nondistended, soft, mild  generalized tenderness without rebound or guarding Neuro:  Symmetric facial movement, fluid speech, generalized weakness of extremities   ED Results / Procedures / Treatments   Labs (all labs ordered are listed, but only abnormal results are displayed) Labs Reviewed  CBC WITH DIFFERENTIAL/PLATELET - Abnormal; Notable for the following components:      Result Value   WBC 15.0 (*)    MCH 25.7 (*)    RDW 16.1 (*)    Neutro Abs 11.9 (*)    All other components within normal limits  COMPREHENSIVE METABOLIC PANEL WITH GFR - Abnormal; Notable for the following components:   Glucose, Bld 109 (*)    All other components within normal limits  URINALYSIS, W/ REFLEX TO CULTURE (INFECTION SUSPECTED) - Abnormal; Notable for the following components:   Color, Urine YELLOW (*)    APPearance CLEAR (*)    Bacteria, UA RARE (*)    All other components within normal limits  CBG MONITORING, ED - Abnormal; Notable for the following components:  Glucose-Capillary 113 (*)    All other components within normal limits  CULTURE, BLOOD (ROUTINE X 2)  CULTURE, BLOOD (ROUTINE X 2)  LIPASE, BLOOD  MAGNESIUM      EKG EKG independently reviewed and interpreted by myself demonstrates:  EKG demonstrates sinus rhythm versus A-fib at a rate of 97, QRS 155, QTc 508, right bundle and left anterior fascicular block noted, not new for patient.  Significant artifact present, repeat obtained without evidence of STEMI.  RADIOLOGY Imaging independently reviewed and interpreted by myself demonstrates:  CT head without acute bleed  CT abdomen pelvis with concerns for pneumonia of the bilateral bases, no acute intra-abdominal findings Chest x-Gilberto Streck without focal consolidation  Formal Radiology Read:  CT Head Wo Contrast Result Date: 10/08/2024 EXAM: CT HEAD WITHOUT CONTRAST 10/08/2024 08:40:04 PM TECHNIQUE: CT of the head was performed without the administration of intravenous contrast. Automated exposure control,  iterative reconstruction, and/or weight based adjustment of the mA/kV was utilized to reduce the radiation dose to as low as reasonably achievable. COMPARISON: Prior examination of 12/08/2022. CLINICAL HISTORY: Mental status change, unknown cause. Pt coming from home via EMS. Fell on floor beside toilet. No injury reported. Increased weakness over the past 2-3 days. Normally able to walk. EMS reports he was deadweight when attempting to move him. A/OX3, disoriented to location. Pt reporting urgency to pee without being able to. Pt hard of hearing. FINDINGS: BRAIN AND VENTRICLES: Parenchymal volume loss is commensurate with the patient's age. Periventricular white matter changes are present likely reflecting the sequela of small vessel ischemia. Remote lacunar infarct within the left basal ganglia. Surgical changes of transsphenoidal pituitary resection with fat packing is again identified. No change since prior examination of 12/08/2022. No acute hemorrhage. No evidence of acute infarct. No hydrocephalus. No extra-axial collection. No mass effect or midline shift. ORBITS: No acute abnormality. SINUSES: No acute abnormality. SOFT TISSUES AND SKULL: No acute soft tissue abnormality. No skull fracture. IMPRESSION: 1. No acute intracranial abnormality. 2. Parenchymal volume loss commensurate with age. 3. Periventricular white matter changes, likely sequela of small vessel ischemia. 4. Remote lacunar infarct within the left basal ganglia, no change since prior examination of 12/08/2022. 5. Surgical changes of transsphenoidal pituitary resection with fat packing. Electronically signed by: Dorethia Molt MD 10/08/2024 08:55 PM EDT RP Workstation: [id number]   CT ABDOMEN PELVIS W CONTRAST Result Date: 10/08/2024 EXAM: CT ABDOMEN AND PELVIS WITH CONTRAST 10/08/2024 08:40:04 PM TECHNIQUE: CT of the abdomen and pelvis was performed with the administration of 100 mL of iohexol  (OMNIPAQUE ) 350 MG/ML injection. Multiplanar  reformatted images are provided for review. Automated exposure control, iterative reconstruction, and/or weight-based adjustment of the mA/kV was utilized to reduce the radiation dose to as low as reasonably achievable. COMPARISON: Comparison made to prior examination of 12/08/2022. CLINICAL HISTORY: Abdominal pain, acute, nonlocalized. Pt coming from home via EMS. Fell on floor beside toilet. No injury reported. Increased weakness over the past 2-3 days. Normally able to walk. EMS reports he was deadweight when attempting to move him. A/OX3, disoriented to location. Pt reporting urgency to pee without being able to. Pt hard of hearing. Prostate cancer. *tracking code: Bo* FINDINGS: LOWER CHEST: Bibasilar airspace infiltrate with consolidation within the posterior basal right lower lobe, possible affecting changes of multifocal infection in the acute setting. LIVER: Stable subcentimeter cyst within the segment 8 of the liver. Liver otherwise unremarkable. GALLBLADDER AND BILE DUCTS: Cholelithiasis without superimposed pericholecystic inflammatory change. No intra or extrahepatic biliary ductal dilation. SPLEEN: Innumerable subcentimeter hypodensity  seen throughout the spleen similar to prior examination possibly representing multiple cysts or hemangioma given the persistence on delayed images. The spleen is not enlarged. PANCREAS: No acute abnormality. ADRENAL GLANDS: No acute abnormality. KIDNEYS, URETERS AND BLADDER: No stones in the kidneys or ureters. No hydronephrosis. No perinephric or periureteral stranding. Urinary bladder is unremarkable. GI AND BOWEL: Mild sigmoid diverticulosis without superimposed acute inflammatory change. The stomach, small bowel, and large bowel are otherwise unremarkable. Appendix normal. PERITONEUM AND RETROPERITONEUM: No ascites. No free air. VASCULATURE: Mild aortoiliac atherosclerotic calcification. No aortic aneurysm. Atherosclerotic severity is actually moderate. LYMPH NODES:  No lymphadenopathy. REPRODUCTIVE ORGANS: The prostate gland is relatively small as are the seminal vesicles, possibly the sequelae of prior therapy. BONES AND SOFT TISSUES: Left inguinal cord lipoma. No abdominal wall hernia. L2-L5 lumbar fusion with instrumentation at L2-L3 and posterior decompression of L3-L5. Sclerotic metastases within the T11 right pedicle and L1 vertebral body are stable since prior examination. No acute bone abnormality. No new foci of metastatic disease identified. * raf score: Aortic atherosclerosis (icd10-i70.0) IMPRESSION: 1. Bibasilar airspace infiltrate with consolidation in the posterior basal right lower lobe, possibly representing multifocal infection in the acute setting. 2. Cholelithiasis without superimposed pericholecystic inflammatory change. 3. Mild sigmoid diverticulosis without superimposed acute inflammatory change. 4. Metastatic prostate cancer: Stable sclerotic metastases within the T11 right pedicle and L1 vertebral body since prior exam; no new foci of metastatic disease identified. No evidence of metastatic disease in the abdomen/pelvis outside the known stable osseous lesions. 5. raf score: Aortic atherosclerosis (icd10-i70.0) Electronically signed by: Dorethia Molt MD 10/08/2024 08:52 PM EDT RP Workstation: [id number]   DG Chest Portable 1 View Result Date: 10/08/2024 CLINICAL DATA:  Weakness. EXAM: PORTABLE CHEST 1 VIEW COMPARISON:  Chest radiograph dated 12/08/2022. FINDINGS: No focal consolidation, pleural effusion or pneumothorax. The cardiac silhouette is within normal limits. No acute osseous pathology. IMPRESSION: No active disease. Electronically Signed   By: Vanetta Chou M.D.   On: 10/08/2024 20:17    PROCEDURES:  Critical Care performed: No  Procedures   MEDICATIONS ORDERED IN ED: Medications  azithromycin (ZITHROMAX) 500 mg in sodium chloride  0.9 % 250 mL IVPB (has no administration in time range)  albuterol (PROVENTIL) (2.5 MG/3ML)  0.083% nebulizer solution 2.5 mg (has no administration in time range)  dextromethorphan-guaiFENesin (MUCINEX DM) 30-600 MG per 12 hr tablet 1 tablet (has no administration in time range)  diphenhydrAMINE (BENADRYL) injection 12.5 mg (has no administration in time range)  hydrALAZINE (APRESOLINE) injection 5 mg (has no administration in time range)  acetaminophen  (TYLENOL ) tablet 650 mg (has no administration in time range)  insulin  aspart (novoLOG ) injection 0-5 Units (0 Units Subcutaneous Hold 10/08/24 2244)  insulin  aspart (novoLOG ) injection 0-9 Units (has no administration in time range)  iohexol  (OMNIPAQUE ) 350 MG/ML injection 100 mL (100 mLs Intravenous Contrast Given 10/08/24 2027)  cefTRIAXone (ROCEPHIN) 2 g in sodium chloride  0.9 % 100 mL IVPB (2 g Intravenous New Bag/Given 10/08/24 2155)     IMPRESSION / MDM / ASSESSMENT AND PLAN / ED COURSE  I reviewed the triage vital signs and the nursing notes.  Differential diagnosis includes, but is not limited to, anemia, electrolyte abnormality, UTI, colitis, diverticulitis, pneumonia, other infectious source  Patient's presentation is most consistent with acute presentation with potential threat to life or bodily function.  89 year old male presenting to the emergency department for evaluation of weakness.  No focal deficits and last known well outside window for stroke intervention.  Labs with leukocytosis with  WC of 15, CMP without significant derangement.  Urinalysis overall not suggestive of infection.  Normal lipase.  Head CT without acute bleed.  CT abdomen pelvis does not note acute intra-abdominal findings, but does note consolidation at bases concerning for infection.  Patient is not hypoxic here, but with his worsening confusion, weakness, consideration for infectious etiology.  Does not meet SIRS criteria, but ordered for antibiotics.  With his degree of weakness and confusion, do think he is appropriate for admission.  Will reach  out to hospitalist team.     FINAL CLINICAL IMPRESSION(S) / ED DIAGNOSES   Final diagnoses:  Weakness  Pneumonia due to infectious organism, unspecified laterality, unspecified part of lung     Rx / DC Orders   ED Discharge Orders     None        Note:  This document was prepared using Dragon voice recognition software and may include unintentional dictation errors.   Levander Slate, MD 10/08/24 [phone]

## 2024-10-08 NOTE — ED Notes (Signed)
 Boosted pt in bwed

## 2024-10-08 NOTE — H&P (Signed)
 History and Physical    Brylan Dec FMW:[id number] DOB: [birth year] DOA: 10/08/2024  Referring MD/NP/PA:   PCP: Cleotilde Oneil FALCON, MD   Patient coming from:  The patient is coming from home.     Chief Complaint: Weakness, fall, sore throat, cough  HPI: Andrew Callahan is a 89 y.o. male with medical history significant of hypertension, hyperlipidemia, diabetes mellitus, stroke, hypothyroidism, OSA on BiPAP, obesity, A-fib on Eliquis , pituitary adenoma (s/p of surgery), panhypopituitarism of anterior and posterior pituitary, hypothyroidism, adrenal insufficiency, kidney stone, stage IV prostate cancer, who presents with weakness, fall, sore throat, mild cough.  Per patient and his wife at the bedside, patient has been feeling weak in the past several days. He has some confusion, but still orientated x 3 when saw patient in ED. Normally pt is able to ambulate on his own and now has weakness in both legs.  No numbness in the extremities. Wife reports that when patient went to the bathroom he went to sit on the toilet, missed and landed on his bottom. No head strike. No LOC. Pt was unable to get up by himself due to weakness.  Patient has sore throat, dry cough, mild SOB, no chest pain.  No fever or chills.  Patient does not have nausea, vomiting, diarrhea or abdominal pain.  Patient has chronic urinary urgency due to history of prostate cancer.  He took last dose of Eliquis  this morning.  Data reviewed independently and ED Course: pt was found to have WBC 15.0, GFR> 60, negative UA.  Temperature normal, blood pressure 157/89, heart rate 86,  RR 22--> 18, oxygen saturation 96% room air.  CT of head negative for acute intracranial abnormalities.  Chest x-ray negative.  CT of abdomen/pelvis that showed multifocal pneumonia.  Patient is placed in telemetry bed for observation.   CT of abdomen/pelvis: 1. Bibasilar airspace infiltrate with consolidation in the posterior basal right lower lobe, possibly  representing multifocal infection in the acute setting. 2. Cholelithiasis without superimposed pericholecystic inflammatory change. 3. Mild sigmoid diverticulosis without superimposed acute inflammatory change. 4. Metastatic prostate cancer: Stable sclerotic metastases within the T11 right pedicle and L1 vertebral body since prior exam; no new foci of metastatic disease identified. No evidence of metastatic disease in the abdomen/pelvis outside the known stable osseous lesions. 5. raf score: Aortic atherosclerosis (icd10-i70.0)   EKG: I have personally reviewed.  Seem to be sinus rhythm, QTc 508, bifascicular block, poor R wave progression.   Review of Systems:   General: no fevers, chills, no body weight gain, has poor appetite, has fatigue HEENT: no blurry vision, hearing changes or sore throat Respiratory: has dyspnea, coughing, no wheezing CV: no chest pain, no palpitations GI: no nausea, vomiting, abdominal pain, diarrhea, constipation GU: no dysuria, burning on urination, has urinary urgency, no hematuria  Ext: no leg edema Neuro: no unilateral weakness, numbness, or tingling, no vision change or hearing loss. Has fall Skin: no rash, no skin tear. MSK: No muscle spasm, no deformity, no limitation of range of movement in spin Heme: No easy bruising.  Travel history: No recent long distant travel.   Allergy:  Allergies  Allergen Reactions   Metoprolol Other (See Comments)    dizziness    Other Other (See Comments)    Live oak trees SNEEZING, RUNNING NOSE   Rivaroxaban Other (See Comments)    hematuria     Past Medical History:  Diagnosis Date   Acquired hypothyroidism    Aortic atherosclerosis    Atrial fibrillation (  HCC)    BCC (basal cell carcinoma) 11/09/2022   left posterior upper arm, EDC 01/09/2023   Chronic constipation    CVA (cerebral vascular accident) (HCC)    DDD (degenerative disc disease), cervical    Diabetes mellitus without complication (HCC)     Diverticulosis    Dysplastic nevus 05/09/2023   mid chest - moderate   Dysplastic nevus 05/09/2023   Mid back right of midline - moderate   Elevated prostate specific antigen (PSA)    Family history of leukemia    Family history of lung cancer    History of kidney stones    History of pituitary adenoma    Hyperlipidemia    Hypertension    Hypogonadism in male    Hypothyroid    Hypothyroidism    IDA (iron  deficiency anemia)    Lumbar disc disease    Nephrolithiasis    Panhypopituitarism (diabetes insipidus/anterior pituitary deficiency)    Paroxysmal A-fib (HCC)    Prostate cancer (HCC)    Sinus bradycardia    Sleep apnea    uses CPAP   Squamous cell carcinoma of skin 2014   Right hand. Mohs.   Stroke (HCC)    throat / slight difficulty swallowing    Past Surgical History:  Procedure Laterality Date   BACK SURGERY  1997   L3, 4, 5 laminectomy    BACK SURGERY  1998   hard wire removed, fusion of L3-5   BACK SURGERY  2009   laminotomy/foraminotomy w/ decompression of nerve root, facet thermal ablation L2-3.    COLONOSCOPY     COLONOSCOPY WITH PROPOFOL  N/A 02/04/2021   Procedure: COLONOSCOPY WITH PROPOFOL ;  Surgeon: Maryruth Ole DASEN, MD;  Location: [facility] ENDOSCOPY;  Service: Endoscopy;  Laterality: N/A;  STAT CBC BEFORE PROCEDURE   ESOPHAGOGASTRODUODENOSCOPY (EGD) WITH PROPOFOL  N/A 02/04/2021   Procedure: ESOPHAGOGASTRODUODENOSCOPY (EGD) WITH PROPOFOL ;  Surgeon: Maryruth Ole DASEN, MD;  Location: [facility] ENDOSCOPY;  Service: Endoscopy;  Laterality: N/A;   HERNIA REPAIR     HOLEP-LASER ENUCLEATION OF THE PROSTATE WITH MORCELLATION N/A 06/18/2020   Procedure: HOLEP-LASER ENUCLEATION OF THE PROSTATE WITH MORCELLATION;  Surgeon: Francisca Redell BROCKS, MD;  Location: [facility] ORS;  Service: Urology;  Laterality: N/A;   JOINT REPLACEMENT     left knee partial   KIDNEY STONE SURGERY  2010   KNEE SURGERY Left 2007   Arthroscopic partial knee replacement, replaced medial side    MOHS  SURGERY Right 2014   squamous cell carcinoma   NECK SURGERY  2008   fusion with hardwire in place C3 through C7    pituitary adenoma  1994   benign   PROSTATE BIOPSY N/A 01/01/2020   Procedure: PROSTATE BIOPSY URO NAV FUSION;  Surgeon: Kassie Ozell SAUNDERS, MD;  Location: [facility] ORS;  Service: Urology;  Laterality: N/A;   SPINE SURGERY     SPINE SURGERY  2016   fusion L4    TONSILLECTOMY      Social History:  reports that he has quit smoking. He has been exposed to tobacco smoke. He has never used smokeless tobacco. He reports current alcohol use. He reports that he does not use drugs.  Family History:  Family History  Problem Relation Age of Onset   Stroke Mother    Heart attack Father    Leukemia Sister 22   Lung cancer Brother 34     Prior to Admission medications   Medication Sig Start Date End Date Taking? Authorizing Provider  acetaminophen  (TYLENOL ) 500 MG  tablet Take 500 mg by mouth every 6 (six) hours as needed.    [provider]  apixaban  (ELIQUIS ) 5 MG TABS tablet Take 5 mg by mouth 2 (two) times daily.    [provider]  B Complex-C (SUPER B COMPLEX PO) Take 1 capsule by mouth daily.     [provider]  cetirizine (ZYRTEC) 10 MG tablet Take 10 mg by mouth daily as needed for allergies.    [provider]  diltiazem  (CARDIZEM ) 30 MG tablet Take 30 mg by mouth daily as needed (afib episode).    [provider]  glipiZIDE  (GLUCOTROL  XL) 2.5 MG 24 hr tablet Take 2.5 mg by mouth daily. Patient taking differently: Take 5 mg by mouth daily.    [provider]  hydrocortisone  (CORTEF ) 10 MG tablet Take 10 mg by mouth 2 (two) times daily.     [provider]  Iron , Ferrous Sulfate, 325 (65 Fe) MG TABS Take 325 mg by mouth daily with supper.    [provider]  latanoprost (XALATAN) 0.005 % ophthalmic solution Place 1 drop into both eyes at bedtime. 12/26/22   [provider]  Leuprolide  Acetate  (ELIGARD  [place]) Inject into the skin. 1 injection every 6 months.    [provider]  levothyroxine  (SYNTHROID ) 50 MCG tablet Take 50 mcg by mouth daily before breakfast.    [provider]  Multiple Vitamin (MULTIVITAMIN WITH MINERALS) TABS tablet Take 1 tablet by mouth daily. One-A-Day Men's 50+    [provider]  polyethylene glycol (MIRALAX  / GLYCOLAX ) 17 g packet Take 17 g by mouth daily. W/coffee    [provider]  Probiotic Product (ALIGN) 4 MG CAPS Take 4 mg by mouth at bedtime.    [provider]  traZODone  (DESYREL ) 50 MG tablet Take 50 mg by mouth at bedtime. 06/17/24 06/17/25  [provider]  valsartan  (DIOVAN ) 160 MG tablet Take 160 mg by mouth 2 (two) times daily. 06/29/22   [provider]    Physical Exam: Vitals:   10/08/24 2245 10/08/24 2308 10/08/24 2311 10/09/24 0043  BP: (!) 149/81 (!) 162/83 (!) 178/72 (!) 165/72  Pulse: 87 82 82 79  Resp: 20   18  Temp:  99.3 F (37.4 C)  98.8 F (37.1 C)  TempSrc:    Oral  SpO2: 95% 98% 97% 100%  Weight:      Height:       General: Not in acute distress HEENT:       Eyes: PERRL, EOMI, no jaundice       ENT: No discharge from the ears and nose, no pharynx injection, no tonsillar enlargement.        Neck: No JVD, no bruit, no mass felt. Heme: No neck lymph node enlargement. Cardiac: S1/S2, RRR, No murmurs, No gallops or rubs. Respiratory: No rales, wheezing, rhonchi or rubs. GI: Soft, nondistended, nontender, no rebound pain, no organomegaly, BS present. GU: No hematuria Ext: No pitting leg edema bilaterally. 1+DP/PT pulse bilaterally. Musculoskeletal: No joint deformities, No joint redness or warmth, no limitation of ROM in spin. Skin: No rashes.  Neuro: Lethargic, but still oriented X3, cranial nerves II-XII grossly intact, moves all extremities  Psych: Patient is not psychotic, no suicidal or hemocidal ideation.  Labs on Admission: I have personally reviewed  following labs and imaging studies  CBC: Recent Labs  Lab 10/08/24 1946  WBC 15.0*  NEUTROABS 11.9*  HGB 13.4  HCT 43.8  MCV 83.9  PLT 211   Basic Metabolic Panel: Recent Labs  Lab 10/08/24 1946  NA 141  K 3.8  CL 106  CO2 23  GLUCOSE 109*  BUN 18  CREATININE 1.00  CALCIUM 8.9  MG 2.1   GFR: Estimated Creatinine Clearance: 65.2 mL/min (by C-G formula based on SCr of 1 mg/dL). Liver Function Tests: Recent Labs  Lab 10/08/24 1946  AST 34  ALT 22  ALKPHOS 60  BILITOT 0.6  PROT 7.6  ALBUMIN 4.2   Recent Labs  Lab 10/08/24 1946  LIPASE 22   No results for input(s): AMMONIA in the last 168 hours. Coagulation Profile: No results for input(s): INR, PROTIME in the last 168 hours. Cardiac Enzymes: No results for input(s): CKTOTAL, CKMB, CKMBINDEX, TROPONINI in the last 168 hours. BNP (last 3 results) No results for input(s): PROBNP in the last 8760 hours. HbA1C: No results for input(s): HGBA1C in the last 72 hours. CBG: Recent Labs  Lab 10/08/24 2242 10/08/24 2322  GLUCAP 113* 115*   Lipid Profile: No results for input(s): CHOL, HDL, LDLCALC, TRIG, CHOLHDL, LDLDIRECT in the last 72 hours. Thyroid  Function Tests: No results for input(s): TSH, T4TOTAL, FREET4, T3FREE, THYROIDAB in the last 72 hours. Anemia Panel: No results for input(s): VITAMINB12, FOLATE, FERRITIN, TIBC, IRON , RETICCTPCT in the last 72 hours. Urine analysis:    Component Value Date/Time   COLORURINE YELLOW (A) 10/08/2024 1952   APPEARANCEUR CLEAR (A) 10/08/2024 1952   APPEARANCEUR Hazy (A) 02/16/2021 0943   LABSPEC 1.019 10/08/2024 1952   PHURINE 5.0 10/08/2024 1952   GLUCOSEU NEGATIVE 10/08/2024 1952   HGBUR NEGATIVE 10/08/2024 1952   BILIRUBINUR NEGATIVE 10/08/2024 1952   BILIRUBINUR Negative 02/16/2021 0943   KETONESUR NEGATIVE 10/08/2024 1952   PROTEINUR NEGATIVE 10/08/2024 1952   NITRITE NEGATIVE 10/08/2024 1952    LEUKOCYTESUR NEGATIVE 10/08/2024 1952   Sepsis Labs: @LABRCNTIP (procalcitonin:4,lacticidven:4) )No results found for this or any previous visit (from the past 240 hours).   Radiological Exams on Admission:   Assessment/Plan Principal Problem:   Multifocal pneumonia Active Problems:   Adrenal insufficiency   Panhypopituitarism of anterior and posterior pituitary   History of CVA (cerebrovascular accident)   Hypothyroidism   Stage IV adenocarcinoma of prostate (HCC)   Paroxysmal atrial fibrillation (HCC)   Type 2 diabetes mellitus with peripheral neuropathy (HCC)   HTN (hypertension)   Fall at home, initial encounter   OSA (obstructive sleep apnea)   Obesity (BMI 35.0-39.9 without comorbidity)   Assessment and Plan:   Multifocal pneumonia: CT of abdomen/pelvis showed bibasilar airspace infiltrate with consolidation in the posterior basal right lower lobe, indicating multifocal pneumonia.  Patient has WBC 15.0, but no fever.  Clinically does not seem to have sepsis.  No oxygen desaturation.  Cannot completely rule out possibility of aspiration.  - Place in tele bed for obs - IV doxycycline and Unasyn (patient received 1 dose of Rocephin in ED) - Mucinex for cough  - Bronchodilators - Urine legionella and S. pneumococcal antigen - Follow up blood culture x2, sputum culture - Check RSEP panel  Adrenal insufficiency and history of panhypopituitarism of anterior and posterior pituitary -Continue home hydrocortisone  10 mg twice daily - Give Solu-Cortef  100 mg IV stress dose  Hypothyroidism - Synthroid   History of CVA (cerebrovascular accident) -Patient is on Eliquis  for A-fib  Stage IV adenocarcinoma of prostate (HCC) -Patient is leuprolide  every 6 month  Paroxysmal atrial fibrillation [facility]): Currently heart rate 86 -Continue Eliquis  - Continue as needed home diltiazem  30 mg  daily for HR > 125  Type 2 diabetes mellitus with peripheral neuropathy [facility]): Recent A1c 5.9.   Well-controlled.  Patient is taking glipizide  at home -SSI  HTN (hypertension) -IV hydralazine as needed - Switch Diovan  to irbesartan  in hospital  Fall at home, initial encounter -PT/OT - Fall precaution  OSA (obstructive sleep apnea) -On BiPAP  Obesity (BMI 35.0-39.9 without comorbidity): Patient has Obesity Class II, with body weight 112 Kg and BMI 35.43 kg/m2.  - Encourage losing weight - Exercise and healthy diet       DVT ppx: on Eliquis   Code Status: DNR per pt and his wife  Family Communication:    Yes, patient's wife at bed side.      Disposition Plan:  Anticipate discharge back to previous environment  Consults called:  none  Admission status and Level of care: Telemetry:    for obs     Dispo: The patient is from: Home              Anticipated d/c is to: Home              Anticipated d/c date is: 1 day              Patient currently is not medically stable to d/c.    Severity of Illness:  The appropriate patient status for this patient is OBSERVATION. Observation status is judged to be reasonable and necessary in order to provide the required intensity of service to ensure the patient's safety. The patient's presenting symptoms, physical exam findings, and initial radiographic and laboratory data in the context of their medical condition is felt to place them at decreased risk for further clinical deterioration. Furthermore, it is anticipated that the patient will be medically stable for discharge from the hospital within 2 midnights of admission.        Date of Service 10/09/2024    Caleb Exon [facility]   If 7PM-7AM, please contact night-coverage [email or web address] 10/09/2024, 1:26 AM

## 2024-10-08 NOTE — ED Triage Notes (Signed)
 Pt coming from home via EMS. Fell on floor beside toilet. No injury reported. Increased weakness over the past 2-3 days. Normally able to walk. EMS reports he was deadweight when attempting to move him. A/OX3, disoriented to location. Pt reporting urgency to pee without being able to. Pt hard of hearing.  Hx of prostate cancer, diabetes, and HTN.  EMS vitals: BP 202/100 BG 76 HR 88 Rhythm Afib SPO2 95% RA

## 2024-10-08 NOTE — ED Notes (Signed)
 Called CCMD for monitoring

## 2024-10-09 ENCOUNTER — Encounter: Payer: Self-pay | Admitting: Internal Medicine

## 2024-10-09 DIAGNOSIS — J189 Pneumonia, unspecified organism: Secondary | ICD-10-CM | POA: Diagnosis present

## 2024-10-09 DIAGNOSIS — R531 Weakness: Secondary | ICD-10-CM | POA: Diagnosis present

## 2024-10-09 DIAGNOSIS — Y92002 Bathroom of unspecified non-institutional (private) residence single-family (private) house as the place of occurrence of the external cause: Secondary | ICD-10-CM | POA: Diagnosis not present

## 2024-10-09 DIAGNOSIS — Z66 Do not resuscitate: Secondary | ICD-10-CM | POA: Diagnosis present

## 2024-10-09 DIAGNOSIS — I10 Essential (primary) hypertension: Secondary | ICD-10-CM | POA: Diagnosis present

## 2024-10-09 DIAGNOSIS — E876 Hypokalemia: Secondary | ICD-10-CM | POA: Diagnosis present

## 2024-10-09 DIAGNOSIS — I700 Atherosclerosis of aorta: Secondary | ICD-10-CM | POA: Diagnosis present

## 2024-10-09 DIAGNOSIS — I480 Paroxysmal atrial fibrillation: Secondary | ICD-10-CM | POA: Diagnosis present

## 2024-10-09 DIAGNOSIS — I452 Bifascicular block: Secondary | ICD-10-CM | POA: Diagnosis present

## 2024-10-09 DIAGNOSIS — Z87891 Personal history of nicotine dependence: Secondary | ICD-10-CM | POA: Diagnosis not present

## 2024-10-09 DIAGNOSIS — J188 Other pneumonia, unspecified organism: Secondary | ICD-10-CM | POA: Diagnosis not present

## 2024-10-09 DIAGNOSIS — E785 Hyperlipidemia, unspecified: Secondary | ICD-10-CM | POA: Diagnosis present

## 2024-10-09 DIAGNOSIS — Z7901 Long term (current) use of anticoagulants: Secondary | ICD-10-CM | POA: Diagnosis not present

## 2024-10-09 DIAGNOSIS — W1839XA Other fall on same level, initial encounter: Secondary | ICD-10-CM | POA: Diagnosis present

## 2024-10-09 DIAGNOSIS — Z1152 Encounter for screening for COVID-19: Secondary | ICD-10-CM | POA: Diagnosis not present

## 2024-10-09 DIAGNOSIS — Z7984 Long term (current) use of oral hypoglycemic drugs: Secondary | ICD-10-CM | POA: Diagnosis not present

## 2024-10-09 DIAGNOSIS — D509 Iron deficiency anemia, unspecified: Secondary | ICD-10-CM | POA: Diagnosis present

## 2024-10-09 DIAGNOSIS — E2740 Unspecified adrenocortical insufficiency: Secondary | ICD-10-CM | POA: Diagnosis present

## 2024-10-09 DIAGNOSIS — W19XXXA Unspecified fall, initial encounter: Secondary | ICD-10-CM | POA: Diagnosis not present

## 2024-10-09 DIAGNOSIS — Z79890 Hormone replacement therapy (postmenopausal): Secondary | ICD-10-CM | POA: Diagnosis not present

## 2024-10-09 DIAGNOSIS — E230 Hypopituitarism: Secondary | ICD-10-CM | POA: Diagnosis present

## 2024-10-09 DIAGNOSIS — I110 Hypertensive heart disease with heart failure: Secondary | ICD-10-CM | POA: Diagnosis present

## 2024-10-09 DIAGNOSIS — Z8249 Family history of ischemic heart disease and other diseases of the circulatory system: Secondary | ICD-10-CM | POA: Diagnosis not present

## 2024-10-09 DIAGNOSIS — C7951 Secondary malignant neoplasm of bone: Secondary | ICD-10-CM | POA: Diagnosis present

## 2024-10-09 DIAGNOSIS — G4733 Obstructive sleep apnea (adult) (pediatric): Secondary | ICD-10-CM | POA: Diagnosis present

## 2024-10-09 DIAGNOSIS — E1142 Type 2 diabetes mellitus with diabetic polyneuropathy: Secondary | ICD-10-CM | POA: Diagnosis present

## 2024-10-09 DIAGNOSIS — Y92009 Unspecified place in unspecified non-institutional (private) residence as the place of occurrence of the external cause: Secondary | ICD-10-CM | POA: Diagnosis not present

## 2024-10-09 DIAGNOSIS — Z6835 Body mass index (BMI) 35.0-35.9, adult: Secondary | ICD-10-CM | POA: Diagnosis not present

## 2024-10-09 DIAGNOSIS — E039 Hypothyroidism, unspecified: Secondary | ICD-10-CM | POA: Diagnosis present

## 2024-10-09 DIAGNOSIS — Z85828 Personal history of other malignant neoplasm of skin: Secondary | ICD-10-CM | POA: Diagnosis not present

## 2024-10-09 DIAGNOSIS — C61 Malignant neoplasm of prostate: Secondary | ICD-10-CM | POA: Diagnosis present

## 2024-10-09 LAB — GLUCOSE, CAPILLARY
Glucose-Capillary: 213 mg/dL — ABNORMAL HIGH (ref 70–99)
Glucose-Capillary: 205 mg/dL — ABNORMAL HIGH (ref 70–99)
Glucose-Capillary: 151 mg/dL — ABNORMAL HIGH (ref 70–99)
Glucose-Capillary: 129 mg/dL — ABNORMAL HIGH (ref 70–99)

## 2024-10-09 LAB — BASIC METABOLIC PANEL WITH GFR
Anion gap: 9 (ref 5–15)
BUN: 16 mg/dL (ref 8–23)
CO2: 21 mmol/L — ABNORMAL LOW (ref 22–32)
Calcium: 8.2 mg/dL — ABNORMAL LOW (ref 8.9–10.3)
Chloride: 111 mmol/L (ref 98–111)
Creatinine, Ser: 0.88 mg/dL (ref 0.61–1.24)
GFR, Estimated: >60 mL/min (ref >60)
Glucose, Bld: 158 mg/dL — ABNORMAL HIGH (ref 70–99)
Potassium: 3.7 mmol/L (ref 3.5–5.1)
Sodium: 141 mmol/L (ref 135–145)

## 2024-10-09 LAB — CBC
HCT: 39.2 % (ref 39.0–52.0)
Hemoglobin: 12.4 g/dL — ABNORMAL LOW (ref 13.0–17.0)
MCH: 26.1 pg (ref 26.0–34.0)
MCHC: 31.6 g/dL (ref 30.0–36.0)
MCV: 82.4 fL (ref 80.0–100.0)
Platelets: 180 K/uL (ref 150–400)
RBC: 4.76 MIL/uL (ref 4.22–5.81)
RDW: 15.9 % — ABNORMAL HIGH (ref 11.5–15.5)
WBC: 12.7 K/uL — ABNORMAL HIGH (ref 4.0–10.5)
nRBC: 0 % (ref 0.0–0.2)

## 2024-10-09 LAB — STREP PNEUMONIAE URINARY ANTIGEN: Strep Pneumo Urinary Antigen: NEGATIVE

## 2024-10-09 LAB — RESP PANEL BY RT-PCR (RSV, FLU A&B, COVID)  RVPGX2
Influenza A by PCR: NEGATIVE
Influenza B by PCR: NEGATIVE
Resp Syncytial Virus by PCR: NEGATIVE
SARS Coronavirus 2 by RT PCR: NEGATIVE

## 2024-10-09 MED ORDER — SUCRALFATE 1 GM/10ML PO SUSP
1.0000 g | Freq: Three times a day (TID) | ORAL | Status: DC
  Administered 2024-10-09 – 2024-10-10 (×5): 1 g via ORAL
  Filled 2024-10-09 (×5): qty 10

## 2024-10-09 MED ORDER — TAMSULOSIN HCL 0.4 MG PO CAPS
0.4000 mg | ORAL_CAPSULE | Freq: Every day | ORAL | Status: DC
  Administered 2024-10-09 – 2024-10-10 (×2): 0.4 mg via ORAL
  Filled 2024-10-09 (×2): qty 1

## 2024-10-09 MED ORDER — LACTULOSE 10 GM/15ML PO SOLN
20.0000 g | Freq: Once | ORAL | Status: AC
  Administered 2024-10-09: 20 g via ORAL
  Filled 2024-10-09: qty 30

## 2024-10-09 MED ORDER — PANTOPRAZOLE SODIUM 40 MG PO TBEC
40.0000 mg | DELAYED_RELEASE_TABLET | Freq: Two times a day (BID) | ORAL | Status: DC
  Administered 2024-10-09 – 2024-10-10 (×3): 40 mg via ORAL
  Filled 2024-10-09 (×3): qty 1

## 2024-10-09 MED ORDER — IRBESARTAN 150 MG PO TABS
150.0000 mg | ORAL_TABLET | Freq: Every day | ORAL | Status: DC
  Administered 2024-10-09 – 2024-10-10 (×2): 150 mg via ORAL
  Filled 2024-10-09 (×2): qty 1

## 2024-10-09 MED ORDER — SODIUM CHLORIDE 0.9 % IV SOLN
3.0000 g | Freq: Four times a day (QID) | INTRAVENOUS | Status: DC
  Administered 2024-10-09: 3 g via INTRAVENOUS
  Filled 2024-10-09 (×2): qty 8

## 2024-10-09 MED ORDER — FERROUS SULFATE 325 (65 FE) MG PO TABS
325.0000 mg | ORAL_TABLET | Freq: Every day | ORAL | Status: DC
  Administered 2024-10-09: 325 mg via ORAL
  Filled 2024-10-09: qty 1

## 2024-10-09 MED ORDER — APIXABAN 5 MG PO TABS
5.0000 mg | ORAL_TABLET | Freq: Two times a day (BID) | ORAL | Status: DC
  Administered 2024-10-09 – 2024-10-10 (×4): 5 mg via ORAL
  Filled 2024-10-09 (×4): qty 1

## 2024-10-09 MED ORDER — SENNOSIDES-DOCUSATE SODIUM 8.6-50 MG PO TABS
2.0000 | ORAL_TABLET | Freq: Two times a day (BID) | ORAL | Status: DC
  Administered 2024-10-10: 2 via ORAL
  Filled 2024-10-09 (×2): qty 2

## 2024-10-09 MED ORDER — TRAZODONE HCL 50 MG PO TABS
50.0000 mg | ORAL_TABLET | Freq: Every day | ORAL | Status: DC
  Administered 2024-10-09 (×2): 50 mg via ORAL
  Filled 2024-10-09 (×2): qty 1

## 2024-10-09 MED ORDER — LEVOTHYROXINE SODIUM 50 MCG PO TABS
50.0000 ug | ORAL_TABLET | Freq: Every day | ORAL | Status: DC
  Administered 2024-10-09 – 2024-10-10 (×2): 50 ug via ORAL
  Filled 2024-10-09 (×2): qty 1

## 2024-10-09 MED ORDER — HYDROCORTISONE 10 MG PO TABS
10.0000 mg | ORAL_TABLET | Freq: Two times a day (BID) | ORAL | Status: DC
  Administered 2024-10-09 – 2024-10-10 (×3): 10 mg via ORAL
  Filled 2024-10-09 (×3): qty 1

## 2024-10-09 MED ORDER — AMOXICILLIN-POT CLAVULANATE 875-125 MG PO TABS
1.0000 | ORAL_TABLET | Freq: Two times a day (BID) | ORAL | Status: DC
  Administered 2024-10-09 – 2024-10-10 (×3): 1 via ORAL
  Filled 2024-10-09 (×3): qty 1

## 2024-10-09 MED ORDER — HYDROCORTISONE SOD SUC (PF) 100 MG IJ SOLR
100.0000 mg | Freq: Once | INTRAMUSCULAR | Status: AC
  Administered 2024-10-09: 100 mg via INTRAVENOUS
  Filled 2024-10-09: qty 2

## 2024-10-09 MED ORDER — DILTIAZEM HCL 30 MG PO TABS
30.0000 mg | ORAL_TABLET | Freq: Every day | ORAL | Status: DC | PRN
  Filled 2024-10-09: qty 1

## 2024-10-09 MED ORDER — MELATONIN 5 MG PO TABS
2.5000 mg | ORAL_TABLET | Freq: Every evening | ORAL | Status: DC | PRN
  Administered 2024-10-09 (×2): 2.5 mg via ORAL
  Filled 2024-10-09 (×2): qty 1

## 2024-10-09 MED ORDER — ALIGN 4 MG PO CAPS: 4.0000 mg | ORAL_CAPSULE | Freq: Every day | ORAL | Status: DC

## 2024-10-09 MED ORDER — ADULT MULTIVITAMIN W/MINERALS CH
1.0000 | ORAL_TABLET | Freq: Every day | ORAL | Status: DC
  Administered 2024-10-09 – 2024-10-10 (×2): 1 via ORAL
  Filled 2024-10-09 (×2): qty 1

## 2024-10-09 NOTE — Plan of Care (Signed)

## 2024-10-09 NOTE — Care Management Obs Status (Signed)
 MEDICARE OBSERVATION STATUS NOTIFICATION   Patient Details  Name: Andrew Callahan MRN: [id number] Date of Birth: 02-Apr-1934   Medicare Observation Status Notification Given:  Yes    [name] 10/09/2024, 12:40 PM

## 2024-10-09 NOTE — Progress Notes (Signed)
 Mobility Specialist - Progress Note   10/09/24 1103  Mobility  Activity Pivoted/transferred to/from BSC;Stood at bedside  Level of Assistance Minimal assist, patient does 75% or more  Assistive Device Front wheel walker  Distance Ambulated (ft) 8 ft  Activity Response Tolerated well  Mobility visit 1 Mobility  Mobility Specialist Start Time (ACUTE ONLY) 1045  Mobility Specialist Stop Time (ACUTE ONLY) 1101  Mobility Specialist Time Calculation (min) (ACUTE ONLY) 16 min   Pt sitting in the recliner upon entry, utilizing RA. Pt transferred to/from the [facility] via SPT ModA-MinA--- MaxA peri care from NT and linen change completed. Pt returned to the recliner, left seated with alarm set and needs within reach.  America Silvan Mobility Specialist 10/09/24 11:06 AM

## 2024-10-09 NOTE — Evaluation (Addendum)
 Clinical/Bedside Swallow Evaluation Patient Details  Name: Andrew Callahan MRN: [id number] Date of Birth: 24-Jul-1934  Today's Date: 10/09/2024 Time: SLP Start Time (ACUTE ONLY): 0305 SLP Stop Time (ACUTE ONLY): 0335 SLP Time Calculation (min) (ACUTE ONLY): 30 min  Past Medical History:  Past Medical History:  Diagnosis Date   Acquired hypothyroidism    Aortic atherosclerosis    Atrial fibrillation (HCC)    BCC (basal cell carcinoma) 11/09/2022   left posterior upper arm, EDC 01/09/2023   Chronic constipation    CVA (cerebral vascular accident) (HCC)    DDD (degenerative disc disease), cervical    Diabetes mellitus without complication (HCC)    Diverticulosis    Dysplastic nevus 05/09/2023   mid chest - moderate   Dysplastic nevus 05/09/2023   Mid back right of midline - moderate   Elevated prostate specific antigen (PSA)    Family history of leukemia    Family history of lung cancer    History of kidney stones    History of pituitary adenoma    Hyperlipidemia    Hypertension    Hypogonadism in male    Hypothyroid    Hypothyroidism    IDA (iron  deficiency anemia)    Lumbar disc disease    Nephrolithiasis    Panhypopituitarism (diabetes insipidus/anterior pituitary deficiency)    Paroxysmal A-fib (HCC)    Prostate cancer (HCC)    Sinus bradycardia    Sleep apnea    uses CPAP   Squamous cell carcinoma of skin 2014   Right hand. Mohs.   Stroke (HCC)    throat / slight difficulty swallowing   Past Surgical History:  Past Surgical History:  Procedure Laterality Date   BACK SURGERY  1997   L3, 4, 5 laminectomy    BACK SURGERY  1998   hard wire removed, fusion of L3-5   BACK SURGERY  2009   laminotomy/foraminotomy w/ decompression of nerve root, facet thermal ablation L2-3.    COLONOSCOPY     COLONOSCOPY WITH PROPOFOL  N/A 02/04/2021   Procedure: COLONOSCOPY WITH PROPOFOL ;  Surgeon: Maryruth Ole DASEN, MD;  Location: [facility];  Service: Endoscopy;  Laterality:  N/A;  STAT CBC BEFORE PROCEDURE   ESOPHAGOGASTRODUODENOSCOPY (EGD) WITH PROPOFOL  N/A 02/04/2021   Procedure: ESOPHAGOGASTRODUODENOSCOPY (EGD) WITH PROPOFOL ;  Surgeon: Maryruth Ole DASEN, MD;  Location: [facility];  Service: Endoscopy;  Laterality: N/A;   HERNIA REPAIR     HOLEP-LASER ENUCLEATION OF THE PROSTATE WITH MORCELLATION N/A 06/18/2020   Procedure: HOLEP-LASER ENUCLEATION OF THE PROSTATE WITH MORCELLATION;  Surgeon: Francisca Redell BROCKS, MD;  Location: [facility] OR[facility];  Service: Urology;  Laterality: N/A;   JOINT REPLACEMENT     left knee partial   KIDNEY STONE SURGERY  2010   KNEE SURGERY Left 2007   Arthroscopic partial knee replacement, replaced medial side    MOHS SURGERY Right 2014   squamous cell carcinoma   NECK SURGERY  2008   fusion with hardwire in place C3 through C7    pituitary adenoma  1994   benign   PROSTATE BIOPSY N/A 01/01/2020   Procedure: PROSTATE BIOPSY URO NAV FUSION;  Surgeon: Kassie Ozell SAUNDERS, MD;  Location: [facility] ORS;  Service: Urology;  Laterality: N/A;   SPINE SURGERY     SPINE SURGERY  2016   fusion L4    TONSILLECTOMY     HPI:  Per MD Progress Note, Andrew Callahan is a 89 y.o. male with medical history significant of hypertension, hyperlipidemia, diabetes mellitus, stroke, hypothyroidism, OSA on BiPAP,  obesity, A-fib on Eliquis , pituitary adenoma (s/p of surgery), panhypopituitarism of anterior and posterior pituitary, hypothyroidism, adrenal insufficiency, kidney stone, stage IV prostate cancer, who presents with weakness, fall, sore throat, mild cough.   Per patient wife, patient has been having upper abdominal pain/discomfort for the last 4 weeks, he has some mild constipation.  Does not have any nausea vomiting.  He had a CT scan of abdomen/pelvis for abdominal discomfort, was found to have bilateral lower lobe pneumonia.    Assessment / Plan / Recommendation  Clinical Impression  Pt seen for bedside swallow assessment in the setting of concern for  aspiration PNA. Hx of CVA noted in 2020, with residual dysarthria (imprecise articulation, ?imprecise resonance noted). Pt/spouse denied dysphagia following CVA. Pt seen with trials of thin liquids (via cup). No overt or subtle s/sx pharyngeal dysphagia noted. No change to vocal quality across trials. Oral phase grossly intact- with complete manipulation and clearance of PO trial from oral cavity. Solids deferred as pt reported just completing lunch without issue. Pt with abdominal discomfort contributing to low appetite- RN and MD aware.   Pt denied history of PNA or GERD. Based on age, hx of CVA, current respiratory function, and current debility, pt is at increased risk of aspiration, therefore recommend aspiration precautions (slow rate, small bites, elevated HOB, and alert for PO intake). Education shared regarding aspiration risk factors and managing risk with oral hygiene and mobility. Pt/spouse reported understanding. Continue with current diet. MD and RN aware of recommendations.   Of note, pt/spouse questioned potential for follow up SLP services to aid residual dysarthria. MD aware. No acute speech-language deficits noted or reported. No further acute SLP services indicated.   SLP Visit Diagnosis: Dysphagia, unspecified (R13.10)    Aspiration Risk  Mild aspiration risk    Diet Recommendation   Thin;Age appropriate regular  Medication Administration: Whole meds with liquid    Other  Recommendations Oral Care Recommendations: Oral care BID;Patient independent with oral care     Assistance Recommended at Discharge  Pt independent with PO intake  Functional Status Assessment Patient has not had a recent decline in their functional status    Swallow Study   General Date of Onset: 10/09/24 HPI: Per MD Progress Note, Andrew Callahan is a 89 y.o. male with medical history significant of hypertension, hyperlipidemia, diabetes mellitus, stroke, hypothyroidism, OSA on BiPAP, obesity, A-fib on  Eliquis , pituitary adenoma (s/p of surgery), panhypopituitarism of anterior and posterior pituitary, hypothyroidism, adrenal insufficiency, kidney stone, stage IV prostate cancer, who presents with weakness, fall, sore throat, mild cough.   Per patient wife, patient has been having upper abdominal pain/discomfort for the last 4 weeks, he has some mild constipation.  Does not have any nausea vomiting.  He had a CT scan of abdomen/pelvis for abdominal discomfort, was found to have bilateral lower lobe pneumonia. Type of Study: Bedside Swallow Evaluation Previous Swallow Assessment: none in chart Diet Prior to this Study: Regular;Thin liquids (Level 0) Temperature Spikes Noted: No (WBC 12.7; temp 99.9) Respiratory Status: Room air History of Recent Intubation: No Behavior/Cognition: Alert;Cooperative;Pleasant mood Oral Cavity Assessment: Within Functional Limits Oral Care Completed by SLP: Recent completion by staff Oral Cavity - Dentition: Adequate natural dentition Vision: Functional for self-feeding Self-Feeding Abilities: Able to feed self Patient Positioning: Upright in chair Baseline Vocal Quality: Normal Volitional Swallow: Able to elicit    Oral/Motor/Sensory Function Overall Oral Motor/Sensory Function:  (grossly intact)   Ice Chips Ice chips: Not tested   Thin Liquid Thin  Liquid: Within functional limits Presentation: Cup    Nectar Thick Nectar Thick Liquid: Not tested   Honey Thick Honey Thick Liquid: Not tested   Puree Puree: Not tested   Solid     Solid: Not tested - pt just completing regular solids meal upon therapist arrival- no reported issue.     Andrew Dehner Clapp, MS, CCC-SLP Speech Language Pathologist Rehab Services; [facility] [place] [phone] (ascom)   Andrew Callahan 10/09/2024,1:40 PM

## 2024-10-09 NOTE — Evaluation (Signed)
 Physical Therapy Evaluation Patient Details Name: Andrew Callahan MRN: [id number] DOB: 1934-02-24 Today's Date: 10/09/2024  History of Present Illness  Pt is a 89 y.o. male who presents with weakness, fall, sore throat, mild cough. Current MD assessment: Multifocal pneumonia PMH of hypertension, hyperlipidemia, diabetes mellitus, stroke, hypothyroidism, OSA on BiPAP, obesity, A-fib on Eliquis , pituitary adenoma (s/p of surgery), panhypopituitarism of anterior and posterior pituitary, hypothyroidism, adrenal insufficiency, kidney stone, stage IV prostate cancer.  Clinical Impression  Pt admitted due multifocal pneumonia with complaints of sore throat, cough, and weakness in LE that led him to sustain a fall at home while attempting toileting. Pt was in recliner upon entry and was able to perform STS from recliner to RW with Min A, ambulate for 169ft with CGA and O2 consistently in high 90s. Pt has tendency to rely only on his LE to sit down and required edu on proper sequencing with stand to sit transfer using RW. Pt would benefit from resuming PT services 2 days/week at [facility] to improve functional strength and remain at his baseline.      If plan is discharge home, recommend the following: A little help with walking and/or transfers;Assist for transportation;Help with stairs or ramp for entrance   Can travel by private vehicle        Equipment Recommendations    Recommendations for Other Services       Functional Status Assessment Patient has had a recent decline in their functional status and demonstrates the ability to make significant improvements in function in a reasonable and predictable amount of time.     Precautions / Restrictions Precautions Precautions: Fall Recall of Precautions/Restrictions: Intact Restrictions Weight Bearing Restrictions Per Provider Order: No      Mobility  Bed Mobility               General bed mobility comments: NT - Pt  in recliner upon entry    Transfers Overall transfer level: Needs assistance Equipment used: Rolling walker (2 wheels) Transfers: Sit to/from Stand Sit to Stand: Min assist           General transfer comment: STS from chair to RW with Min A; Pt required edu on sequencing of stand to sit transfer with RW    Ambulation/Gait Ambulation/Gait assistance: Contact guard assist Gait Distance (Feet): 100 Feet Assistive device: Rolling walker (2 wheels) Gait Pattern/deviations: Step-through pattern, Decreased stride length, Narrow base of support       General Gait Details: Pt ambulated 100 ft with CGA and no LOB. O2 monitored.  Stairs            Wheelchair Mobility     Tilt Bed    Modified Rankin (Stroke Patients Only)       Balance Overall balance assessment: Needs assistance Sitting-balance support: Feet supported, Single extremity supported Sitting balance-Leahy Scale: Good     Standing balance support: Bilateral upper extremity supported Standing balance-Leahy Scale: Fair                               Pertinent Vitals/Pain Pain Assessment Pain Assessment: No/denies pain    Home Living Family/patient expects to be discharged to:: Private residence Living Arrangements: Spouse/significant other Available Help at Discharge: Family;Available 24 hours/day Type of Home: House Home Access: Stairs to enter Entrance Stairs-Rails: None Entrance Stairs-Number of Steps: 1 step to enter garage   Home Layout: One level Home Equipment: Shower seat - built  in;Rollator (4 wheels);Standard Walker;Grab bars - toilet;Grab bars - tub/shower Additional Comments: Pt lives at [facility]    Prior Function Prior Level of Function : Independent/Modified Independent             Mobility Comments: uses rollator for ambulation, gets up 3-4x per night to go to the bathroom on his own ADLs Comments: Wife assists with all bathing and dressing, pt  goes to the bathroom on his own     Extremity/Trunk Assessment   Upper Extremity Assessment Upper Extremity Assessment: Overall WFL for tasks assessed    Lower Extremity Assessment Lower Extremity Assessment: Overall WFL for tasks assessed    Cervical / Trunk Assessment Cervical / Trunk Assessment: Kyphotic  Communication   Communication Communication: Impaired Factors Affecting Communication: Hearing impaired    Cognition Arousal: Alert Behavior During Therapy: WFL for tasks assessed/performed   PT - Cognitive impairments: No apparent impairments                       PT - Cognition Comments: A&Ox3 - not oriented to location Following commands: Intact       Cueing Cueing Techniques: Gestural cues, Verbal cues, Tactile cues     General Comments General comments (skin integrity, edema, etc.): NT    Exercises Other Exercises Other Exercises: Gait with CGA for 14ft - O2 monitored   Assessment/Plan    PT Assessment Patient needs continued PT services  PT Problem List Decreased strength;Decreased activity tolerance;Decreased balance;Decreased mobility       PT Treatment Interventions Gait training;Therapeutic activities;Balance training    PT Goals (Current goals can be found in the Care Plan section)  Acute Rehab PT Goals Patient Stated Goal: To return home PT Goal Formulation: With patient/family Time For Goal Achievement: 10/23/24 Potential to Achieve Goals: Good    Frequency Min 1X/week     Co-evaluation               AM-PAC PT 6 Clicks Mobility  Outcome Measure Help needed turning from your back to your side while in a flat bed without using bedrails?: A Little Help needed moving from lying on your back to sitting on the side of a flat bed without using bedrails?: A Little Help needed moving to and from a bed to a chair (including a wheelchair)?: A Little Help needed standing up from a chair using your arms (e.g., wheelchair or  bedside chair)?: A Little Help needed to walk in hospital room?: A Little Help needed climbing 3-5 steps with a railing? : A Lot 6 Click Score: 17    End of Session Equipment Utilized During Treatment: Gait belt Activity Tolerance: Patient tolerated treatment well Patient left: in chair;with call bell/phone within reach;with chair alarm set;with family/visitor present Nurse Communication: Mobility status PT Visit Diagnosis: Unsteadiness on feet (R26.81);Other abnormalities of gait and mobility (R26.89);History of falling (Z91.81);Muscle weakness (generalized) (M62.81)    Time: 8663-8587 PT Time Calculation (min) (ACUTE ONLY): 36 min   Charges:                 Allena Bulls, SPT   Allena Bulls 10/09/2024, 4:17 PM

## 2024-10-09 NOTE — Discharge Instructions (Signed)

## 2024-10-09 NOTE — Evaluation (Addendum)
 Occupational Therapy Evaluation Patient Details Name: Andrew Callahan MRN: [id number] DOB: [birth year] Today's Date: 10/09/2024   History of Present Illness   Pt is a 89 y.o. male who presents with weakness, fall, sore throat, mild cough. Current MD assessment: Multifocal pneumonia PMH of hypertension, hyperlipidemia, diabetes mellitus, stroke, hypothyroidism, OSA on BiPAP, obesity, A-fib on Eliquis , pituitary adenoma (s/p of surgery), panhypopituitarism of anterior and posterior pituitary, hypothyroidism, adrenal insufficiency, kidney stone, stage IV prostate cancer.     Clinical Impressions Pt was seen for OT evaluation this date. PTA, pt resides at [facility] with his wife. There is 1 STE through garage and pt ambulates household and limited community distances with a rollator. Wife reports she assists with all dressing/bathing, he gets into the shower 1x/wk and she sponge bathes him other days. He is able to get up to the bathroom 3-4x/night on his own using rollator.  Pt presents with deficits in strength, balance and activity tolerance, affecting safe and optimal ADL completion. Pt currently requires Min/Mod A for bed mobility to assist with trunkal elevation and scooting forward. Pt perseverates on stating the bed is holding him down and making it hard for him to move. He required Min/Mod A to stand from lowest bed height with cues for hand placement. He demo stand step pivot to recliner with CGA and good safety. Additional stand performed from recliner with CGA and cues to push up from recliner arms. Pt wishing to eat his breakfast tray at this time. Pt is not currently at his baseline function and will benefit from skilled OT services to address noted impairments and functional limitations with continued OT services recommended on DC.     If plan is discharge home, recommend the following:   A little help with walking and/or transfers;A lot of help with bathing/dressing/bathroom;Assist  for transportation;Assistance with cooking/housework;Help with stairs or ramp for entrance     Functional Status Assessment   Patient has had a recent decline in their functional status and demonstrates the ability to make significant improvements in function in a reasonable and predictable amount of time.     Equipment Recommendations   None recommended by OT     Recommendations for Other Services         Precautions/Restrictions   Precautions Precautions: Fall Recall of Precautions/Restrictions: Intact Restrictions Weight Bearing Restrictions Per Provider Order: No     Mobility Bed Mobility Overal bed mobility: Needs Assistance Bed Mobility: Supine to Sit     Supine to sit: Min assist, HOB elevated, Used rails, Mod assist     General bed mobility comments: cues for intiaition, Min A to bring trunk upright then Mod A to forward scoot to get feet on floor    Transfers Overall transfer level: Needs assistance Equipment used: Rolling walker (2 wheels) Transfers: Sit to/from Stand, Bed to chair/wheelchair/BSC Sit to Stand: Contact guard assist, Min assist     Step pivot transfers: Contact guard assist     General transfer comment: Min A to stand intially from low bed height with cues for hand placement and weight shift, able to standing and step pivot to recliner with Min A; additional stand from recliner with CGA      Balance Overall balance assessment: Needs assistance Sitting-balance support: Feet supported, Single extremity supported Sitting balance-Leahy Scale: Fair     Standing balance support: Reliant on assistive device for balance, Bilateral upper extremity supported Standing balance-Leahy Scale: Fair Standing balance comment: RW  ADL either performed or assessed with clinical judgement   ADL Overall ADL's : Needs assistance/impaired Eating/Feeding: Set up;Supervision/ safety;Sitting Eating/Feeding  Details (indicate cue type and reason): in recliner at end of session                     Toilet Transfer: Contact guard assist;Rolling walker (2 wheels) Toilet Transfer Details (indicate cue type and reason): simulated to recliner via step pivot with RW use and cues for safety         Functional mobility during ADLs: Contact guard assist;Rolling walker (2 wheels)       Vision         Perception         Praxis         Pertinent Vitals/Pain Pain Assessment Pain Assessment: No/denies pain     Extremity/Trunk Assessment Upper Extremity Assessment Upper Extremity Assessment: Generalized weakness   Lower Extremity Assessment Lower Extremity Assessment: Generalized weakness       Communication Communication Communication: Impaired Factors Affecting Communication: Hearing impaired   Cognition Arousal: Alert Behavior During Therapy: Restless Cognition: Difficult to assess Difficult to assess due to: Hard of hearing/deaf           OT - Cognition Comments: perseverates on things--specifically the bed causing him to stick to it making it difficult to get up; repeats himself often throughout session                 Following commands: Intact       Cueing  General Comments      VSS on RA   Exercises Other Exercises Other Exercises: Edu on role of OT in acute setting.   Shoulder Instructions      Home Living Family/patient expects to be discharged to:: Private residence [facility]) Living Arrangements: Spouse/significant other Available Help at Discharge: Family;Available 24 hours/day Type of Home: House Home Access: Stairs to enter [employer] of Steps: 1 through garage per wife Entrance Stairs-Rails: None Home Layout: One level     Bathroom Shower/Tub: Producer, Television/film/video: Standard     Home Equipment: Shower seat - built in;Rollator (4 wheels);Standard Walker;Grab bars - toilet;Grab bars -  tub/shower   Additional Comments: Pt lives at [facility]      Prior Functioning/Environment Prior Level of Function : Independent/Modified Independent             Mobility Comments: uses rollator for ambulation, gets up 3-4x per night to go to the bathroom on his own ADLs Comments: Wife assists with all bathing and dressing, pt goes to the bathroom on his own    OT Problem List: Decreased strength;Decreased activity tolerance   OT Treatment/Interventions: Self-care/ADL training;Therapeutic exercise;Therapeutic activities;Patient/family education;DME and/or AE instruction;Balance training      OT Goals(Current goals can be found in the care plan section)   Acute Rehab OT Goals Patient Stated Goal: get stronger to return home with wife OT Goal Formulation: With patient Time For Goal Achievement: 10/23/24 Potential to Achieve Goals: Good ADL Goals Pt Will Perform Lower Body Dressing: with mod assist;with min assist;sit to/from stand;sitting/lateral leans Pt Will Transfer to Toilet: with supervision;with modified independence;ambulating   OT Frequency:  Min 2X/week    Co-evaluation              AM-PAC OT 6 Clicks Daily Activity     Outcome Measure Help from another person eating meals?: A Little Help from another person taking care of  personal grooming?: A Little Help from another person toileting, which includes using toliet, bedpan, or urinal?: A Lot Help from another person bathing (including washing, rinsing, drying)?: A Lot Help from another person to put on and taking off regular upper body clothing?: A Little Help from another person to put on and taking off regular lower body clothing?: A Lot 6 Click Score: 15   End of Session Equipment Utilized During Treatment: Gait belt;Rolling walker (2 wheels) Nurse Communication: Mobility status  Activity Tolerance: Patient tolerated treatment well Patient left: in chair;with call bell/phone within reach;with  chair alarm set;with family/visitor present  OT Visit Diagnosis: Other abnormalities of gait and mobility (R26.89);Muscle weakness (generalized) (M62.81)                Time: 9086-9057 OT Time Calculation (min): 29 min Charges:  OT General Charges $OT Visit: 1 Visit OT Evaluation $OT Eval Moderate Complexity: 1 Mod OT Treatments $Self Care/Home Management : 8-22 mins Mariadejesus Cade, OTR/L 10/09/24, 2:39 PM  Euel Castile E Jameyah Fennewald 10/09/2024, 2:39 PM

## 2024-10-09 NOTE — TOC Initial Note (Signed)
 Transition of Care [facility]) - Initial/Assessment Note    Patient Details  Name: Andrew Callahan MRN: [id number] Date of Birth: [birth year]  Transition of Care [facility]) CM/SW Contact:    Daved JONETTA Hamilton, RN Phone Number: 10/09/2024, 4:29 PM  Clinical Narrative:                  Met with patient, patient spouse Darice at bedside, introduced self and role in discharge planning.  Darice verbalized they reside at [facility] and that patient requires moderate to max assistance with a lot of ADL's. Darice verbalized she is his primary caregiver and assists with bathing and dressing. Darice verbalized she manages his medications and prepares or arranges all of his meals and transportation. Darice verbalized he is ambulatory.   Verbalized to patient and spouse TOC will follow up once therapy recommendations are in, Darice verbalized understanding and agreement.   Barriers to Discharge: Continued Medical Work up   Patient Goals and CMS Choice            Expected Discharge Plan and Services                                              Prior Living Arrangements/Services     Patient language and need for interpreter reviewed:: Yes Do you feel safe going back to the place where you live?: Yes      Need for Family Participation in Patient Care: Yes (Comment) Care giver support system in place?: Yes (comment)   Criminal Activity/Legal Involvement Pertinent to Current Situation/Hospitalization: No - Comment as needed  Activities of Daily Living   ADL Screening (condition at time of admission) Independently performs ADLs?: No Does the patient have a NEW difficulty with bathing/dressing/toileting/self-feeding that is expected to last >3 days?: Yes (Initiates electronic notice to provider for possible OT consult) Does the patient have a NEW difficulty with getting in/out of bed, walking, or climbing stairs that is expected to last >3 days?: Yes (Initiates electronic notice to provider for  possible PT consult) Does the patient have a NEW difficulty with communication that is expected to last >3 days?: Yes (Initiates electronic notice to provider for possible SLP consult) Is the patient deaf or have difficulty hearing?: Yes Does the patient have difficulty seeing, even when wearing glasses/contacts?: No Does the patient have difficulty concentrating, remembering, or making decisions?: Yes  Permission Sought/Granted Permission sought to share information with : Family Supports Permission granted to share information with : Yes, Verbal Permission Granted  Share Information with NAME: Darice Ace     Permission granted to share info w Relationship: Spouse  Permission granted to share info w Contact Information: [phone]  Emotional Assessment Appearance:: Appears stated age, Well-Groomed Attitude/Demeanor/Rapport: Engaged Affect (typically observed): Appropriate Orientation: : Oriented to Self, Oriented to Place, Oriented to Situation Alcohol / Substance Use: Not Applicable Psych Involvement: No (comment)  Admission diagnosis:  CAP (community acquired pneumonia) [J18.9] Multifocal pneumonia [J18.8] Patient Active Problem List   Diagnosis Date Noted   HTN (hypertension) 10/09/2024   Multifocal pneumonia 10/08/2024   Fever 12/11/2022   Hypothyroidism 12/09/2022   Paroxysmal atrial fibrillation (HCC) 12/09/2022   Type 2 diabetes mellitus with peripheral neuropathy (HCC) 12/09/2022   Generalized weakness 12/09/2022   Fall at home, initial encounter 12/09/2022   SIRS (systemic inflammatory response syndrome) (HCC) 12/08/2022   COPD exacerbation (HCC) 12/08/2022  NSTEMI (non-ST elevated myocardial infarction) (HCC) 11/18/2021   Chronic diastolic CHF (congestive heart failure), NYHA class 3 (HCC) 03/21/2021   Aortic atherosclerosis 01/27/2021   Lower limb ulcer, calf, right, limited to breakdown of skin (HCC) 01/18/2021   Swelling of limb 01/18/2021   Secondary  adrenal insufficiency 10/08/2020   Central hypothyroidism 10/08/2020   Lower urinary tract symptoms (LUTS) 05/19/2020   Metastatic adenocarcinoma to bone (HCC) 05/03/2020   Stage IV adenocarcinoma of prostate (HCC) 05/03/2020   Genetic testing 04/07/2020   Family history of lung cancer    Family history of leukemia    Numbness and tingling of foot 03/22/2020   Goals of care, counseling/discussion 02/17/2020   Prostate cancer (HCC) 02/15/2020   Tubular adenoma 11/04/2019   Medicare annual wellness visit, initial 11/04/2019   Dysarthria 07/01/2019   History of CVA (cerebrovascular accident) 07/01/2019   Anticoagulant long-term use 06/22/2019   Left sided lacunar infarction (HCC) 06/18/2019   Hyperlipidemia 05/31/2019   Coronary artery calcification 04/12/2019   Benign essential hypertension 01/15/2019   History of pituitary adenoma 01/15/2019   Panhypopituitarism of anterior and posterior pituitary 01/15/2019   Hematuria 10/11/2018   Sinus bradycardia 10/11/2018   Thyroid  disease 10/11/2018   Chronic constipation 10/04/2018   Diverticulosis large intestine w/o perforation or abscess w/o bleeding 10/04/2018   Polyp of descending colon 10/04/2018   OSA (obstructive sleep apnea) 05/15/2018   Paroxysmal atrial fibrillation with rapid ventricular response (HCC) 01/16/2018   Type 2 diabetes mellitus without complication, without long-term current use of insulin  (HCC) 11/08/2017   Adrenal insufficiency 10/05/2017   Snores 05/09/2017   Health care maintenance 11/06/2016   Mononeuropathy 10/16/2016   Obesity (BMI 35.0-39.9 without comorbidity) 04/03/2016   Tinnitus of both ears 04/03/2016   Spinal stenosis of lumbar region 01/28/2015   Benign prostatic hyperplasia without urinary obstruction 04/07/2013   Lumbar degenerative disc disease 12/23/2012   Acquired hypothyroidism 11/24/2010   Calculus, kidney 11/24/2010   Raised prostate specific antigen 11/24/2010   Hearing loss 11/24/2010    Hyperlipidemia associated with type 2 diabetes mellitus (HCC) 11/24/2010   Testicular hypofunction 11/24/2010   Osteoarthritis of multiple joints 11/24/2010   Rhinitis, allergic 11/24/2010   Skin tag 11/24/2010   PCP:  Cleotilde Oneil FALCON, MD Pharmacy:   [facility] [phone] [address], [place] - [address] [place] [address] Phone: [phone] Fax: [phone]     Social Drivers of Health (SDOH) Social History: SDOH Screenings   Food Insecurity: No Food Insecurity (10/08/2024)  Housing: Low Risk  (10/08/2024)  Transportation Needs: No Transportation Needs (10/08/2024)  Utilities: Not At Risk (10/08/2024)  Depression (PHQ2-9): Low Risk  (06/17/2024)  Financial Resource Strain: Low Risk  (12/14/2023)   Received from [facility]  Social Connections: Moderately Isolated (10/08/2024)  Tobacco Use: Medium Risk (10/09/2024)   SDOH Interventions:     Readmission Risk Interventions     No data to display

## 2024-10-09 NOTE — Hospital Course (Signed)
 Andrew Callahan is a 89 y.o. male with medical history significant of hypertension, hyperlipidemia, diabetes mellitus, stroke, hypothyroidism, OSA on BiPAP, obesity, A-fib on Eliquis , pituitary adenoma (s/p of surgery), panhypopituitarism of anterior and posterior pituitary, hypothyroidism, adrenal insufficiency, kidney stone, stage IV prostate cancer, who presents with weakness, fall, sore throat, mild cough.  Per patient wife, patient has been having upper abdominal pain/discomfort for the last 4 weeks, he has some mild constipation.  Does not have any nausea vomiting. He had a CT scan of abdomen/pelvis for abdominal discomfort, was found to have bilateral lower lobe pneumonia. Patient condition has improved, abdominal pain is better.  No respiratory symptoms.  Will continue a few days antibiotics, patient medically stable for discharge

## 2024-10-09 NOTE — Progress Notes (Signed)
 Progress Note   Patient: Andrew Callahan FMW:[id number] DOB: [birth year] DOA: 10/08/2024     0 DOS: the patient was seen and examined on 10/09/2024   Brief hospital course: Kepler Mccabe is a 89 y.o. male with medical history significant of hypertension, hyperlipidemia, diabetes mellitus, stroke, hypothyroidism, OSA on BiPAP, obesity, A-fib on Eliquis , pituitary adenoma (s/p of surgery), panhypopituitarism of anterior and posterior pituitary, hypothyroidism, adrenal insufficiency, kidney stone, stage IV prostate cancer, who presents with weakness, fall, sore throat, mild cough.  Per patient wife, patient has been having upper abdominal pain/discomfort for the last 4 weeks, he has some mild constipation.  Does not have any nausea vomiting. He had a CT scan of abdomen/pelvis for abdominal discomfort, was found to have bilateral lower lobe pneumonia.   Principal Problem:   Multifocal pneumonia Active Problems:   Adrenal insufficiency   Panhypopituitarism of anterior and posterior pituitary   History of CVA (cerebrovascular accident)   Hypothyroidism   Stage IV adenocarcinoma of prostate (HCC)   Paroxysmal atrial fibrillation (HCC)   Type 2 diabetes mellitus with peripheral neuropathy (HCC)   HTN (hypertension)   Fall at home, initial encounter   OSA (obstructive sleep apnea)   Obesity (BMI 35.0-39.9 without comorbidity)   Assessment and Plan: Multifocal pneumonia: CT of abdomen/pelvis showed bibasilar airspace infiltrate with consolidation in the posterior basal right lower lobe, indicating multifocal pneumonia.  Patient has WBC 15.0, but no fever.  Clinically does not seem to have sepsis.  No oxygen desaturation.    Condition most likely due to aspiration, patient denies any dysphagia or choking episodes.  This could be from acid reflux while wearing a CPAP. I will treat him with 5 days of antibiotics, but switch over to oral Augmentin.  Upper abdominal pain.  Most likely gastritis versus  a peptic ulcer disease. Patient has more pain after eating, with the possibility of a gastric ulcer. Patient is started on oral Protonix and sucralfate.  Patient also has some constipation, will start senna as well as give her a dose of her lactulose.  Adrenal insufficiency and history of panhypopituitarism of anterior and posterior pituitary -Continue home hydrocortisone  10 mg twice daily   Hypothyroidism - Synthroid    History of CVA (cerebrovascular accident) -Patient is on Eliquis  for A-fib   Stage IV adenocarcinoma of prostate (HCC) -Patient is leuprolide  every 6 month   Paroxysmal atrial fibrillation [facility]): Currently heart rate 86 -Continue Eliquis  - Continue as needed home diltiazem  30 mg daily for HR > 125   Type 2 diabetes mellitus with peripheral neuropathy (HCC): Recent A1c 5.9.  Well-controlled.  Patient is taking glipizide  at home -SSI   HTN (hypertension) Continue ARB.   Fall at home, initial encounter Weakness. PT/OT to eval.   OSA (obstructive sleep apnea) Patient could not tolerate hospital CPAP, ask wife to bring home CPAP to the hospital.   Class II obesity (BMI 35.0-39.9 without comorbidity): Patient has Obesity Class II, with body weight 112 Kg and BMI 35.43 kg/m2.  - Encourage losing weight - Exercise and healthy diet       Subjective:  Patient complaining some upper abdominal discomfort, no nausea vomiting.  Has some constipation.  Denies any shortness of breath.  Minimal cough.  Physical Exam: Vitals:   10/08/24 2311 10/09/24 0043 10/09/24 0404 10/09/24 0857  BP: (!) 178/72 (!) 165/72 (!) 153/72 (!) 143/67  Pulse: 82 79 69 60  Resp:  18 18 14   Temp:  98.8 F (37.1 C) 99.9 F (37.7 C)  97.7 F (36.5 C)  TempSrc:  Oral Oral Oral  SpO2: 97% 100% 95% 98%  Weight:      Height:       General exam: Appears calm and comfortable  Respiratory system: Clear to auscultation. Respiratory effort normal. Cardiovascular system: S1 & S2 heard, RRR.  No JVD, murmurs, rubs, gallops or clicks. No pedal edema. Gastrointestinal system: Abdomen is nondistended, soft and nontender. No organomegaly or masses felt. Normal bowel sounds heard. Central nervous system: Alert and oriented. No focal neurological deficits. Extremities: Symmetric 5 x 5 power. Skin: No rashes, lesions or ulcers Psychiatry: Judgement and insight appear normal. Mood & affect appropriate.    Data Reviewed:  CT scan results and lab results reviewed.  Family Communication: Wife updated at bedside.  Disposition: Status is: Observation      Time spent: 55 minutes  Author: Murvin Mana, MD 10/09/2024 1:00 PM  For on call review [email or web address].

## 2024-10-10 ENCOUNTER — Other Ambulatory Visit: Payer: Self-pay

## 2024-10-10 ENCOUNTER — Encounter: Payer: Self-pay | Admitting: Oncology

## 2024-10-10 DIAGNOSIS — W19XXXA Unspecified fall, initial encounter: Secondary | ICD-10-CM | POA: Diagnosis not present

## 2024-10-10 DIAGNOSIS — Y92009 Unspecified place in unspecified non-institutional (private) residence as the place of occurrence of the external cause: Secondary | ICD-10-CM | POA: Diagnosis not present

## 2024-10-10 DIAGNOSIS — J188 Other pneumonia, unspecified organism: Secondary | ICD-10-CM | POA: Diagnosis not present

## 2024-10-10 DIAGNOSIS — C61 Malignant neoplasm of prostate: Secondary | ICD-10-CM | POA: Diagnosis not present

## 2024-10-10 LAB — BASIC METABOLIC PANEL WITH GFR
Anion gap: 11 (ref 5–15)
BUN: 21 mg/dL (ref 8–23)
CO2: 21 mmol/L — ABNORMAL LOW (ref 22–32)
Calcium: 8.2 mg/dL — ABNORMAL LOW (ref 8.9–10.3)
Chloride: 109 mmol/L (ref 98–111)
Creatinine, Ser: 0.96 mg/dL (ref 0.61–1.24)
GFR, Estimated: >60 mL/min (ref >60)
Glucose, Bld: 133 mg/dL — ABNORMAL HIGH (ref 70–99)
Potassium: 3.2 mmol/L — ABNORMAL LOW (ref 3.5–5.1)
Sodium: 141 mmol/L (ref 135–145)

## 2024-10-10 LAB — CBC
HCT: 37.2 % — ABNORMAL LOW (ref 39.0–52.0)
Hemoglobin: 11.8 g/dL — ABNORMAL LOW (ref 13.0–17.0)
MCH: 25.9 pg — ABNORMAL LOW (ref 26.0–34.0)
MCHC: 31.7 g/dL (ref 30.0–36.0)
MCV: 81.8 fL (ref 80.0–100.0)
Platelets: 176 K/uL (ref 150–400)
RBC: 4.55 MIL/uL (ref 4.22–5.81)
RDW: 16.2 % — ABNORMAL HIGH (ref 11.5–15.5)
WBC: 8.9 K/uL (ref 4.0–10.5)
nRBC: 0 % (ref 0.0–0.2)

## 2024-10-10 LAB — MAGNESIUM: Magnesium: 2.4 mg/dL (ref 1.7–2.4)

## 2024-10-10 LAB — LEGIONELLA PNEUMOPHILA SEROGP 1 UR AG: L. pneumophila Serogp 1 Ur Ag: NEGATIVE

## 2024-10-10 LAB — GLUCOSE, CAPILLARY
Glucose-Capillary: 138 mg/dL — ABNORMAL HIGH (ref 70–99)
Glucose-Capillary: 173 mg/dL — ABNORMAL HIGH (ref 70–99)

## 2024-10-10 MED ORDER — LACTULOSE 20 GM/30ML PO SOLN
20.0000 g | Freq: Every day | ORAL | 0 refills | Status: AC | PRN
  Filled 2024-10-10: qty 450, 15d supply, fill #0

## 2024-10-10 MED ORDER — AMOXICILLIN-POT CLAVULANATE 875-125 MG PO TABS
1.0000 | ORAL_TABLET | Freq: Two times a day (BID) | ORAL | 0 refills | Status: AC
  Filled 2024-10-10: qty 6, 3d supply, fill #0

## 2024-10-10 MED ORDER — SUCRALFATE 1 G PO TABS
1.0000 g | ORAL_TABLET | Freq: Three times a day (TID) | ORAL | 0 refills | Status: AC
  Filled 2024-10-10: qty 28, 7d supply, fill #0

## 2024-10-10 MED ORDER — SENNOSIDES-DOCUSATE SODIUM 8.6-50 MG PO TABS
2.0000 | ORAL_TABLET | Freq: Two times a day (BID) | ORAL | 0 refills | Status: AC | PRN
  Filled 2024-10-10: qty 100, 17d supply, fill #0

## 2024-10-10 MED ORDER — POTASSIUM CHLORIDE 20 MEQ PO PACK
40.0000 meq | PACK | ORAL | Status: AC
  Administered 2024-10-10 (×2): 40 meq via ORAL
  Filled 2024-10-10 (×2): qty 2

## 2024-10-10 MED ORDER — POTASSIUM CHLORIDE ER 20 MEQ PO TBCR
20.0000 meq | EXTENDED_RELEASE_TABLET | Freq: Every day | ORAL | 0 refills | Status: AC
  Filled 2024-10-10: qty 4, 4d supply, fill #0

## 2024-10-10 MED ORDER — PANTOPRAZOLE SODIUM 40 MG PO TBEC
40.0000 mg | DELAYED_RELEASE_TABLET | Freq: Two times a day (BID) | ORAL | 0 refills | Status: AC
  Filled 2024-10-10: qty 60, 30d supply, fill #0

## 2024-10-10 NOTE — Discharge Summary (Signed)
 Physician Discharge Summary   Patient: Andrew Callahan MRN: [id number] DOB: 19-Jun-1934  Admit date:     10/08/2024  Discharge date: 10/10/24  Discharge Physician: Murvin Mana   PCP: Cleotilde Oneil FALCON, MD   Recommendations at discharge:   Follow-up with PCP in 1 week. Check a BMP at the next office visit.  Discharge Diagnoses: Principal Problem:   Multifocal pneumonia Active Problems:   Adrenal insufficiency   Panhypopituitarism of anterior and posterior pituitary   History of CVA (cerebrovascular accident)   Hypothyroidism   Stage IV adenocarcinoma of prostate (HCC)   Paroxysmal atrial fibrillation (HCC)   Type 2 diabetes mellitus with peripheral neuropathy (HCC)   HTN (hypertension)   Fall at home, initial encounter   OSA (obstructive sleep apnea)   Obesity (BMI 35.0-39.9 without comorbidity)  Resolved Problems:   * No resolved hospital problems. *  Hospital Course: Andrew Callahan is a 89 y.o. male with medical history significant of hypertension, hyperlipidemia, diabetes mellitus, stroke, hypothyroidism, OSA on BiPAP, obesity, A-fib on Eliquis , pituitary adenoma (s/p of surgery), panhypopituitarism of anterior and posterior pituitary, hypothyroidism, adrenal insufficiency, kidney stone, stage IV prostate cancer, who presents with weakness, fall, sore throat, mild cough.  Per patient wife, patient has been having upper abdominal pain/discomfort for the last 4 weeks, he has some mild constipation.  Does not have any nausea vomiting. He had a CT scan of abdomen/pelvis for abdominal discomfort, was found to have bilateral lower lobe pneumonia. Patient condition has improved, abdominal pain is better.  No respiratory symptoms.  Will continue a few days antibiotics, patient medically stable for discharge  Assessment and Plan:  Multifocal pneumonia: CT of abdomen/pelvis showed bibasilar airspace infiltrate with consolidation in the posterior basal right lower lobe, indicating multifocal  pneumonia.  Patient has WBC 15.0, but no fever.  Clinically does not seem to have sepsis.  No oxygen desaturation.    Condition most likely due to aspiration, patient denies any dysphagia or choking episodes.  This could be from acid reflux while wearing a CPAP. I will treat him with 5 days of antibiotics, but switch over to oral Augmentin. Condition has improved.  Medically stable for discharge   Upper abdominal pain.  Most likely gastritis versus a peptic ulcer disease. Hypokalemia Minimal metabolic acidosis. Patient has more pain after eating, with the possibility of a gastric ulcer. Patient is started on oral Protonix and sucralfate.  Patient also has some constipation, will start senna as well as give her a dose of her lactulose. Patient condition much improved, abdominal pain much better.  Patient had multiple bowel movements after lactulose, which probably resulted in some hypokalemia.  Give 80 mEq of oral potassium for potassium 3.2 today.  Continue 20 mEq of oral KCl daily for 4 days.  Check a BMP at her next office visit.    Adrenal insufficiency and history of panhypopituitarism of anterior and posterior pituitary -Continue home hydrocortisone  10 mg twice daily   Hypothyroidism - Synthroid    History of CVA (cerebrovascular accident) -Patient is on Eliquis  for A-fib   Stage IV adenocarcinoma of prostate (HCC) -Patient is leuprolide  every 6 month   Paroxysmal atrial fibrillation [facility]): Currently heart rate 86 -Continue Eliquis     Type 2 diabetes mellitus with peripheral neuropathy (HCC): Recent A1c 5.9.  Well-controlled.  Patient is taking glipizide  at home Resume home treatment   HTN (hypertension) Continue ARB.   Fall at home, initial encounter Weakness. Seen by PT/OT, recommended home PT/OT.   OSA (obstructive sleep  apnea) Continue home CPAP.   Class II obesity (BMI 35.0-39.9 without comorbidity): Patient has Obesity Class II, with body weight 112 Kg and BMI  35.43 kg/m2.  - Encourage losing weight - Exercise and healthy diet       Consultants: None Procedures performed: None  Disposition: Home health Diet recommendation:  Discharge Diet Orders (From admission, onward)     Start     Ordered   10/10/24 0000  Diet - low sodium heart healthy        10/10/24 1045           Cardiac diet DISCHARGE MEDICATION: Allergies as of 10/10/2024       Reactions   Metoprolol Other (See Comments)   dizziness   Other Other (See Comments)   Live oak trees SNEEZING, RUNNING NOSE   Rivaroxaban Other (See Comments)   hematuria        Medication List     STOP taking these medications    glipiZIDE  2.5 MG 24 hr tablet Commonly known as: GLUCOTROL  XL       TAKE these medications    acetaminophen  500 MG tablet Commonly known as: TYLENOL  Take 500 mg by mouth every 6 (six) hours as needed.   amoxicillin -clavulanate 875-125 MG tablet Commonly known as: AUGMENTIN Take 1 tablet by mouth every 12 (twelve) hours for 3 days.   apixaban  5 MG Tabs tablet Commonly known as: ELIQUIS  Take 5 mg by mouth 2 (two) times daily.   diltiazem  30 MG tablet Commonly known as: CARDIZEM  Take 30 mg by mouth daily as needed (afib episode).   ELIGARD  [place] Inject into the skin. 1 injection every 6 months.   hydrocortisone  10 MG tablet Commonly known as: CORTEF  Take 10 mg by mouth 2 (two) times daily.   Iron  (Ferrous Sulfate) 325 (65 Fe) MG Tabs Take 325 mg by mouth every Monday, Wednesday, and Friday.   Lactulose 20 GM/30ML Soln Take 30 mLs (20 g total) by mouth daily as needed.   latanoprost 0.005 % ophthalmic solution Commonly known as: XALATAN Place 1 drop into both eyes at bedtime.   levothyroxine  50 MCG tablet Commonly known as: SYNTHROID  Take 50 mcg by mouth daily before breakfast.   melatonin 3 MG Tabs tablet Take 3 mg by mouth at bedtime.   multivitamin with minerals Tabs tablet Take 1 tablet by mouth daily. One-A-Day Men's 50+    pantoprazole 40 MG tablet Commonly known as: PROTONIX Take 1 tablet (40 mg total) by mouth 2 (two) times daily before a meal.   polyethylene glycol 17 g packet Commonly known as: MIRALAX  / GLYCOLAX  Take 17 g by mouth daily. W/coffee   Potassium Chloride  ER 20 MEQ Tbcr Take 1 tablet (20 mEq total) by mouth daily for 4 days.   senna-docusate 8.6-50 MG tablet Commonly known as: Senokot-S Take 2 tablets by mouth 3 times/day as needed-between meals & bedtime for mild constipation.   sucralfate 1 GM/10ML suspension Commonly known as: CARAFATE Take 10 mLs (1 g total) by mouth 4 (four) times daily -  with meals and at bedtime for 7 days.   SUPER B COMPLEX PO Take 1 capsule by mouth daily.   traZODone  50 MG tablet Commonly known as: DESYREL  Take 50 mg by mouth at bedtime.   valsartan  160 MG tablet Commonly known as: DIOVAN  Take 160 mg by mouth 2 (two) times daily.        Follow-up Information     Cleotilde Oneil FALCON, MD Follow up in  1 week(s).   Specialty: Internal Medicine Contact information: [address] [facility] [place] [address] [phone]                Discharge Exam: Fredricka Weights   10/08/24 1944  Weight: 112 kg   General exam: Appears calm and comfortable  Respiratory system: Clear to auscultation. Respiratory effort normal. Cardiovascular system: S1 & S2 heard, RRR. No JVD, murmurs, rubs, gallops or clicks. No pedal edema. Gastrointestinal system: Abdomen is nondistended, soft and nontender. No organomegaly or masses felt. Normal bowel sounds heard. Central nervous system: Alert and oriented. No focal neurological deficits. Extremities: Symmetric 5 x 5 power. Skin: No rashes, lesions or ulcers Psychiatry: Judgement and insight appear normal. Mood & affect appropriate.    Condition at discharge: good  The results of significant diagnostics from this hospitalization (including imaging, microbiology, ancillary and  laboratory) are listed below for reference.   Imaging Studies: CT Head Wo Contrast Result Date: 10/08/2024 EXAM: CT HEAD WITHOUT CONTRAST 10/08/2024 08:40:04 PM TECHNIQUE: CT of the head was performed without the administration of intravenous contrast. Automated exposure control, iterative reconstruction, and/or weight based adjustment of the mA/kV was utilized to reduce the radiation dose to as low as reasonably achievable. COMPARISON: Prior examination of 12/08/2022. CLINICAL HISTORY: Mental status change, unknown cause. Pt coming from home via EMS. Fell on floor beside toilet. No injury reported. Increased weakness over the past 2-3 days. Normally able to walk. EMS reports he was deadweight when attempting to move him. A/OX3, disoriented to location. Pt reporting urgency to pee without being able to. Pt hard of hearing. FINDINGS: BRAIN AND VENTRICLES: Parenchymal volume loss is commensurate with the patient's age. Periventricular white matter changes are present likely reflecting the sequela of small vessel ischemia. Remote lacunar infarct within the left basal ganglia. Surgical changes of transsphenoidal pituitary resection with fat packing is again identified. No change since prior examination of 12/08/2022. No acute hemorrhage. No evidence of acute infarct. No hydrocephalus. No extra-axial collection. No mass effect or midline shift. ORBITS: No acute abnormality. SINUSES: No acute abnormality. SOFT TISSUES AND SKULL: No acute soft tissue abnormality. No skull fracture. IMPRESSION: 1. No acute intracranial abnormality. 2. Parenchymal volume loss commensurate with age. 3. Periventricular white matter changes, likely sequela of small vessel ischemia. 4. Remote lacunar infarct within the left basal ganglia, no change since prior examination of 12/08/2022. 5. Surgical changes of transsphenoidal pituitary resection with fat packing. Electronically signed by: Dorethia Molt MD 10/08/2024 08:55 PM EDT RP  Workstation: [id number]   CT ABDOMEN PELVIS W CONTRAST Result Date: 10/08/2024 EXAM: CT ABDOMEN AND PELVIS WITH CONTRAST 10/08/2024 08:40:04 PM TECHNIQUE: CT of the abdomen and pelvis was performed with the administration of 100 mL of iohexol  (OMNIPAQUE ) 350 MG/ML injection. Multiplanar reformatted images are provided for review. Automated exposure control, iterative reconstruction, and/or weight-based adjustment of the mA/kV was utilized to reduce the radiation dose to as low as reasonably achievable. COMPARISON: Comparison made to prior examination of 12/08/2022. CLINICAL HISTORY: Abdominal pain, acute, nonlocalized. Pt coming from home via EMS. Fell on floor beside toilet. No injury reported. Increased weakness over the past 2-3 days. Normally able to walk. EMS reports he was deadweight when attempting to move him. A/OX3, disoriented to location. Pt reporting urgency to pee without being able to. Pt hard of hearing. Prostate cancer. *tracking code: Bo* FINDINGS: LOWER CHEST: Bibasilar airspace infiltrate with consolidation within the posterior basal right lower lobe, possible affecting changes of multifocal  infection in the acute setting. LIVER: Stable subcentimeter cyst within the segment 8 of the liver. Liver otherwise unremarkable. GALLBLADDER AND BILE DUCTS: Cholelithiasis without superimposed pericholecystic inflammatory change. No intra or extrahepatic biliary ductal dilation. SPLEEN: Innumerable subcentimeter hypodensity seen throughout the spleen similar to prior examination possibly representing multiple cysts or hemangioma given the persistence on delayed images. The spleen is not enlarged. PANCREAS: No acute abnormality. ADRENAL GLANDS: No acute abnormality. KIDNEYS, URETERS AND BLADDER: No stones in the kidneys or ureters. No hydronephrosis. No perinephric or periureteral stranding. Urinary bladder is unremarkable. GI AND BOWEL: Mild sigmoid diverticulosis without superimposed acute inflammatory  change. The stomach, small bowel, and large bowel are otherwise unremarkable. Appendix normal. PERITONEUM AND RETROPERITONEUM: No ascites. No free air. VASCULATURE: Mild aortoiliac atherosclerotic calcification. No aortic aneurysm. Atherosclerotic severity is actually moderate. LYMPH NODES: No lymphadenopathy. REPRODUCTIVE ORGANS: The prostate gland is relatively small as are the seminal vesicles, possibly the sequelae of prior therapy. BONES AND SOFT TISSUES: Left inguinal cord lipoma. No abdominal wall hernia. L2-L5 lumbar fusion with instrumentation at L2-L3 and posterior decompression of L3-L5. Sclerotic metastases within the T11 right pedicle and L1 vertebral body are stable since prior examination. No acute bone abnormality. No new foci of metastatic disease identified. * raf score: Aortic atherosclerosis (icd10-i70.0) IMPRESSION: 1. Bibasilar airspace infiltrate with consolidation in the posterior basal right lower lobe, possibly representing multifocal infection in the acute setting. 2. Cholelithiasis without superimposed pericholecystic inflammatory change. 3. Mild sigmoid diverticulosis without superimposed acute inflammatory change. 4. Metastatic prostate cancer: Stable sclerotic metastases within the T11 right pedicle and L1 vertebral body since prior exam; no new foci of metastatic disease identified. No evidence of metastatic disease in the abdomen/pelvis outside the known stable osseous lesions. 5. raf score: Aortic atherosclerosis (icd10-i70.0) Electronically signed by: Dorethia Molt MD 10/08/2024 08:52 PM EDT RP Workstation: [id number]   DG Chest Portable 1 View Result Date: 10/08/2024 CLINICAL DATA:  Weakness. EXAM: PORTABLE CHEST 1 VIEW COMPARISON:  Chest radiograph dated 12/08/2022. FINDINGS: No focal consolidation, pleural effusion or pneumothorax. The cardiac silhouette is within normal limits. No acute osseous pathology. IMPRESSION: No active disease. Electronically Signed   By: Vanetta Chou M.D.   On: 10/08/2024 20:17    Microbiology: Results for orders placed or performed during the hospital encounter of 10/08/24  Culture, blood (Routine X 2) w Reflex to ID Panel     Status: None (Preliminary result)   Collection Time: 10/08/24 11:39 PM   Specimen: BLOOD  Result Value Ref Range Status   Specimen Description BLOOD BLOOD LEFT ARM  Final   Special Requests   Final    BOTTLES DRAWN AEROBIC AND ANAEROBIC Blood Culture results may not be optimal due to an inadequate volume of blood received in culture bottles   Culture   Final    NO GROWTH 2 DAYS Performed at [facility], [address], [place], [place] [address]    Report Status PENDING  Incomplete  Culture, blood (Routine X 2) w Reflex to ID Panel     Status: None (Preliminary result)   Collection Time: 10/08/24 11:39 PM   Specimen: BLOOD  Result Value Ref Range Status   Specimen Description BLOOD BLOOD LEFT HAND  Final   Special Requests   Final    BOTTLES DRAWN AEROBIC ONLY Blood Culture adequate volume   Culture   Final    NO GROWTH 2 DAYS Performed at [facility], [address], [place], [place] [address]  Report Status PENDING  Incomplete  Resp panel by RT-PCR (RSV, Flu A&B, Covid) Anterior Nasal Swab     Status: None   Collection Time: 10/09/24  2:16 AM   Specimen: Anterior Nasal Swab  Result Value Ref Range Status   SARS Coronavirus 2 by RT PCR NEGATIVE NEGATIVE Final    Comment: (NOTE) SARS-CoV-2 target nucleic acids are NOT DETECTED.  The SARS-CoV-2 RNA is generally detectable in upper respiratory specimens during the acute phase of infection. The lowest concentration of SARS-CoV-2 viral copies this assay can detect is 138 copies/mL. A negative result does not preclude SARS-Cov-2 infection and should not be used as the sole basis for treatment or other patient management decisions. A negative result may occur with  improper specimen collection/handling, submission of  specimen other than nasopharyngeal swab, presence of viral mutation(s) within the areas targeted by this assay, and inadequate number of viral copies(<138 copies/mL). A negative result must be combined with clinical observations, patient history, and epidemiological information. The expected result is Negative.  Fact Sheet for Patients:  bloggercourse.com  Fact Sheet for Healthcare Providers:  seriousbroker.it  This test is no t yet approved or cleared by the [place]  FDA and  has been authorized for detection and/or diagnosis of SARS-CoV-2 by FDA under an Emergency Use Authorization (EUA). This EUA will remain  in effect (meaning this test can be used) for the duration of the COVID-19 declaration under Section 564(b)(1) of the Act, 21 U.S.C.section 360bbb-3(b)(1), unless the authorization is terminated  or revoked sooner.       Influenza A by PCR NEGATIVE NEGATIVE Final   Influenza B by PCR NEGATIVE NEGATIVE Final    Comment: (NOTE) The Xpert Xpress SARS-CoV-2/FLU/RSV plus assay is intended as an aid in the diagnosis of influenza from Nasopharyngeal swab specimens and should not be used as a sole basis for treatment. Nasal washings and aspirates are unacceptable for Xpert Xpress SARS-CoV-2/FLU/RSV testing.  Fact Sheet for Patients: bloggercourse.com  Fact Sheet for Healthcare Providers: seriousbroker.it  This test is not yet approved or cleared by the [place]  FDA and has been authorized for detection and/or diagnosis of SARS-CoV-2 by FDA under an Emergency Use Authorization (EUA). This EUA will remain in effect (meaning this test can be used) for the duration of the COVID-19 declaration under Section 564(b)(1) of the Act, 21 U.S.C. section 360bbb-3(b)(1), unless the authorization is terminated or revoked.     Resp Syncytial Virus by PCR NEGATIVE NEGATIVE Final     Comment: (NOTE) Fact Sheet for Patients: bloggercourse.com  Fact Sheet for Healthcare Providers: seriousbroker.it  This test is not yet approved or cleared by the [place]  FDA and has been authorized for detection and/or diagnosis of SARS-CoV-2 by FDA under an Emergency Use Authorization (EUA). This EUA will remain in effect (meaning this test can be used) for the duration of the COVID-19 declaration under Section 564(b)(1) of the Act, 21 U.S.C. section 360bbb-3(b)(1), unless the authorization is terminated or revoked.  Performed at [facility], [address] [place], [place] [address]     Labs: CBC: Recent Labs  Lab 10/08/24 1946 10/09/24 0352 10/10/24 0929  WBC 15.0* 12.7* 8.9  NEUTROABS 11.9*  --   --   HGB 13.4 12.4* 11.8*  HCT 43.8 39.2 37.2*  MCV 83.9 82.4 81.8  PLT 211 180 176   Basic Metabolic Panel: Recent Labs  Lab 10/08/24 1946 10/09/24 0352 10/10/24 0929  NA 141 141 141  K 3.8 3.7 3.2*  CL 106 111 109  CO2 23 21* 21*  GLUCOSE 109* 158* 133*  BUN 18 16 21   CREATININE 1.00 0.88 0.96  CALCIUM 8.9 8.2* 8.2*  MG 2.1  --  2.4   Liver Function Tests: Recent Labs  Lab 10/08/24 1946  AST 34  ALT 22  ALKPHOS 60  BILITOT 0.6  PROT 7.6  ALBUMIN 4.2   CBG: Recent Labs  Lab 10/09/24 0844 10/09/24 1147 10/09/24 1614 10/09/24 2114 10/10/24 0819  GLUCAP 213* 205* 151* 129* 138*    Discharge time spent: .  Signed: Murvin Mana, MD [facility] 10/10/2024

## 2024-10-10 NOTE — Plan of Care (Signed)

## 2024-10-10 NOTE — Progress Notes (Signed)
 Occupational Therapy Treatment Patient Details Name: Andrew Callahan MRN: [id number] DOB: [birth year] Today's Date: 10/10/2024   History of present illness Pt is a 89 y.o. male who presents with weakness, fall, sore throat, mild cough. Current MD assessment: Multifocal pneumonia PMH of hypertension, hyperlipidemia, diabetes mellitus, stroke, hypothyroidism, OSA on BiPAP, obesity, A-fib on Eliquis , pituitary adenoma (s/p of surgery), panhypopituitarism of anterior and posterior pituitary, hypothyroidism, adrenal insufficiency, kidney stone, stage IV prostate cancer.   OT comments  Pt seen for OT tx. Spouse present and supportive throughout. Pt required MOD-MAX A for LB dressing involving STS transfers. Pt requires BUE support on the RW for stability. Pt/spouse educated in role of OT, benefits of completing exertional activities in sitting for energy conservation and safety (particularly sitting for a shower), minimize unnecessary bending that may cause dizziness during ADL, falls prevention, AE/DME, and home/routines modifications to support caregiver safety when assisting pt with ADL. Also educated in importance of foot care given neuropathy. Pt/spouse verbalized understanding. Provided with visual aides and spouse took notes on recommendations. Encouraged spouse to work with OT upon discharge to continue to trial AE/DME and better assess DME needs within the home. Pt/spouse very appreciative. Pt progressing well towards goals.       If plan is discharge home, recommend the following:  A little help with walking and/or transfers;A lot of help with bathing/dressing/bathroom;Assist for transportation;Assistance with cooking/housework;Help with stairs or ramp for entrance;Direct supervision/assist for medications management   Equipment Recommendations  Tub/shower seat;Other (comment) (reacher, sock aide?, handheld shower head holder for shower wall, telescoping mirror for foot care, long handled sponge)     Recommendations for Other Services      Precautions / Restrictions Precautions Precautions: Fall Recall of Precautions/Restrictions: Intact Restrictions Weight Bearing Restrictions Per Provider Order: No       Mobility Bed Mobility               General bed mobility comments: NT - Pt in recliner upon entry    Transfers Overall transfer level: Needs assistance Equipment used: Rolling walker (2 wheels) Transfers: Sit to/from Stand Sit to Stand: Contact guard assist, Min assist                 Balance Overall balance assessment: Needs assistance Sitting-balance support: Feet supported, Single extremity supported, No upper extremity supported Sitting balance-Leahy Scale: Good     Standing balance support: Bilateral upper extremity supported, Reliant on assistive device for balance Standing balance-Leahy Scale: Fair                             ADL either performed or assessed with clinical judgement   ADL Overall ADL's : Needs assistance/impaired                     Lower Body Dressing: Sit to/from stand;Moderate assistance;Maximal assistance;With caregiver independent assisting Lower Body Dressing Details (indicate cue type and reason): Pt/spouse educated in AE for LB ADL to promote increased independence and safety while minimizing risk of dizziness with bending over.                    Extremity/Trunk Assessment              Vision       Haematologist Communication Communication: Impaired Factors Affecting Communication: Hearing impaired   Cognition Arousal: Alert Behavior During Therapy: [facility] for  tasks assessed/performed Cognition: Difficult to assess Difficult to assess due to: Hard of hearing/deaf                             Following commands: Intact        Cueing   Cueing Techniques: Gestural cues, Verbal cues, Tactile cues  Exercises Other Exercises Other  Exercises: Pt/spouse educated in role of OT, benefits of completing exertional activities in sitting (particularly a shower) to improve safety, minimize unnecessary bending that may cause dizziness, falls prevention, AE/DME, and home/routines modifications to support caregiver safety when assisting pt with ADL. Also educated in importance of foot care given neuropathy.    Shoulder Instructions       General Comments      Pertinent Vitals/ Pain       Pain Assessment Pain Assessment: No/denies pain  Home Living                                          Prior Functioning/Environment              Frequency  Min 2X/week        Progress Toward Goals  OT Goals(current goals can now be found in the care plan section)  Progress towards OT goals: Progressing toward goals  Acute Rehab OT Goals Patient Stated Goal: get stronger and return home with wife OT Goal Formulation: With patient Time For Goal Achievement: 10/23/24 Potential to Achieve Goals: Good  Plan      Co-evaluation                 AM-PAC OT 6 Clicks Daily Activity     Outcome Measure   Help from another person eating meals?: None Help from another person taking care of personal grooming?: A Little Help from another person toileting, which includes using toliet, bedpan, or urinal?: A Lot Help from another person bathing (including washing, rinsing, drying)?: A Lot Help from another person to put on and taking off regular upper body clothing?: A Little Help from another person to put on and taking off regular lower body clothing?: A Lot 6 Click Score: 16    End of Session Equipment Utilized During Treatment: Rolling walker (2 wheels)  OT Visit Diagnosis: Other abnormalities of gait and mobility (R26.89);Muscle weakness (generalized) (M62.81)   Activity Tolerance Patient tolerated treatment well   Patient Left in chair;with call bell/phone within reach;with chair alarm set;with  family/visitor present   Nurse Communication          Time: 8966-8892 OT Time Calculation (min): 34 min  Charges: OT General Charges $OT Visit: 1 Visit OT Treatments $Self Care/Home Management : 23-37 mins  Warren SAUNDERS., MPH, MS, OTR/L ascom [phone] 10/10/24, 12:15 PM

## 2024-10-10 NOTE — Progress Notes (Signed)
 Mobility Specialist - Progress Note   10/10/24 0935  Mobility  Activity Ambulated with assistance;Pivoted/transferred from bed to chair  Level of Assistance Minimal assist, patient does 75% or more  Assistive Device Front wheel walker  Distance Ambulated (ft) 6 ft  Activity Response Tolerated well  Mobility visit 1 Mobility  Mobility Specialist Start Time (ACUTE ONLY) 0919  Mobility Specialist Stop Time (ACUTE ONLY) 0931  Mobility Specialist Time Calculation (min) (ACUTE ONLY) 12 min   Pt supine upon entry, utilizing RA. Pt transferred bed to chair MinA-CGA, tolerated well. Pt left seated with alarm set and needs within reach. Family present at bedside.  America Silvan Mobility Specialist 10/10/24 9:38 AM

## 2024-10-10 NOTE — Plan of Care (Signed)
  Problem: Education: Goal: Ability to describe self-care measures that may prevent or decrease complications (Diabetes Survival Skills Education) will improve Outcome: Adequate for Discharge Goal: Individualized Educational Video(s) Outcome: Adequate for Discharge   Problem: Coping: Goal: Ability to adjust to condition or change in health will improve Outcome: Adequate for Discharge   Problem: Fluid Volume: Goal: Ability to maintain a balanced intake and output will improve Outcome: Adequate for Discharge   Problem: Health Behavior/Discharge Planning: Goal: Ability to identify and utilize available resources and services will improve Outcome: Adequate for Discharge Goal: Ability to manage health-related needs will improve Outcome: Adequate for Discharge   Problem: Metabolic: Goal: Ability to maintain appropriate glucose levels will improve Outcome: Adequate for Discharge   Problem: Nutritional: Goal: Maintenance of adequate nutrition will improve Outcome: Adequate for Discharge Goal: Progress toward achieving an optimal weight will improve Outcome: Adequate for Discharge   Problem: Skin Integrity: Goal: Risk for impaired skin integrity will decrease Outcome: Adequate for Discharge   Problem: Tissue Perfusion: Goal: Adequacy of tissue perfusion will improve Outcome: Adequate for Discharge   Problem: Education: Goal: Knowledge of General Education information will improve Description: Including pain rating scale, medication(s)/side effects and non-pharmacologic comfort measures Outcome: Adequate for Discharge   Problem: Health Behavior/Discharge Planning: Goal: Ability to manage health-related needs will improve Outcome: Adequate for Discharge   Problem: Clinical Measurements: Goal: Ability to maintain clinical measurements within normal limits will improve Outcome: Adequate for Discharge Goal: Will remain free from infection Outcome: Adequate for Discharge Goal:  Diagnostic test results will improve Outcome: Adequate for Discharge Goal: Respiratory complications will improve Outcome: Adequate for Discharge Goal: Cardiovascular complication will be avoided Outcome: Adequate for Discharge   Problem: Activity: Goal: Risk for activity intolerance will decrease Outcome: Adequate for Discharge   Problem: Nutrition: Goal: Adequate nutrition will be maintained Outcome: Adequate for Discharge   Problem: Coping: Goal: Level of anxiety will decrease Outcome: Adequate for Discharge   Problem: Elimination: Goal: Will not experience complications related to bowel motility Outcome: Adequate for Discharge Goal: Will not experience complications related to urinary retention Outcome: Adequate for Discharge   Problem: Pain Managment: Goal: General experience of comfort will improve and/or be controlled Outcome: Adequate for Discharge   Problem: Safety: Goal: Ability to remain free from injury will improve Outcome: Adequate for Discharge   Problem: Skin Integrity: Goal: Risk for impaired skin integrity will decrease Outcome: Adequate for Discharge   Problem: Activity: Goal: Ability to tolerate increased activity will improve Outcome: Adequate for Discharge   Problem: Clinical Measurements: Goal: Ability to maintain a body temperature in the normal range will improve Outcome: Adequate for Discharge   Problem: Respiratory: Goal: Ability to maintain adequate ventilation will improve Outcome: Adequate for Discharge Goal: Ability to maintain a clear airway will improve Outcome: Adequate for Discharge

## 2024-10-13 LAB — CULTURE, BLOOD (ROUTINE X 2)
Culture: NO GROWTH
Culture: NO GROWTH
Special Requests: ADEQUATE

## 2024-10-16 ENCOUNTER — Other Ambulatory Visit: Payer: Self-pay

## 2024-12-23 ENCOUNTER — Inpatient Hospital Stay

## 2024-12-23 ENCOUNTER — Inpatient Hospital Stay: Attending: Oncology | Admitting: Oncology

## 2024-12-23 ENCOUNTER — Encounter: Payer: Self-pay | Admitting: Oncology

## 2024-12-23 VITALS — BP 148/92 | HR 64 | Temp 98.5°F | Resp 18 | Ht 70.0 in | Wt 239.0 lb

## 2024-12-23 DIAGNOSIS — C61 Malignant neoplasm of prostate: Secondary | ICD-10-CM | POA: Diagnosis not present

## 2024-12-23 DIAGNOSIS — C7951 Secondary malignant neoplasm of bone: Secondary | ICD-10-CM | POA: Diagnosis present

## 2024-12-23 DIAGNOSIS — Z79899 Other long term (current) drug therapy: Secondary | ICD-10-CM | POA: Insufficient documentation

## 2024-12-23 DIAGNOSIS — Z5111 Encounter for antineoplastic chemotherapy: Secondary | ICD-10-CM | POA: Diagnosis present

## 2024-12-23 MED ORDER — DENOSUMAB 120 MG/1.7ML ~~LOC~~ SOLN
120.0000 mg | Freq: Once | SUBCUTANEOUS | Status: AC
  Administered 2024-12-23: 120 mg via SUBCUTANEOUS
  Filled 2024-12-23: qty 1.7

## 2024-12-23 MED ORDER — LEUPROLIDE ACETATE (6 MONTH) 45 MG ~~LOC~~ KIT
45.0000 mg | PACK | Freq: Once | SUBCUTANEOUS | Status: AC
  Administered 2024-12-23: 45 mg via SUBCUTANEOUS
  Filled 2024-12-23: qty 45

## 2024-12-23 NOTE — Progress Notes (Signed)
 Ok to use CMP from Dr cleotilde office  ca 9.5 12/17/24 per Dr Jacobo

## 2024-12-23 NOTE — Progress Notes (Unsigned)
 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  INTERVAL HISTORY: Patient returns to clinic today for routine evaluation and continuation of Eligard  and Xgeva .  He continues to have chronic fatigue as well as nocturia, but otherwise feels well.  He has no neurologic complaints.  He denies any recent fevers or illnesses.  He has a good appetite.  He has no chest pain, shortness of breath, cough, or hemoptysis.  He denies any nausea, vomiting, constipation, or diarrhea.  He has no other urinary complaints.  Patient offers no further specific complaints today.    REVIEW OF SYSTEMS:   Review of Systems  Constitutional:  Positive for malaise/fatigue. Negative for fever and weight loss.  Respiratory: Negative.  Negative for cough and hemoptysis.   Cardiovascular: Negative.  Negative for chest pain and leg swelling.  Gastrointestinal: Negative.  Negative for abdominal pain.  Genitourinary:  Positive for frequency. Negative for hematuria.  Musculoskeletal: Negative.  Negative for back pain.  Skin: Negative.  Negative for rash.  Neurological:  Positive for weakness. Negative for dizziness, sensory change, focal weakness and headaches.  Psychiatric/Behavioral: Negative.  The patient is not nervous/anxious.     As per HPI. Otherwise, a complete review of systems is negative.  PAST MEDICAL HISTORY: Past Medical History:  Diagnosis Date   Acquired hypothyroidism    Aortic atherosclerosis    Atrial fibrillation (HCC)    BCC (basal cell carcinoma) 11/09/2022   left posterior upper arm, EDC 01/09/2023   Chronic constipation    CVA (cerebral vascular accident) (HCC)     DDD (degenerative disc disease), cervical    Diabetes mellitus without complication (HCC)    Diverticulosis    Dysplastic nevus 05/09/2023   mid chest - moderate   Dysplastic nevus 05/09/2023   Mid back right of midline - moderate   Elevated prostate specific antigen (PSA)    Family history of leukemia    Family history of lung cancer    History of kidney stones    History of pituitary adenoma    Hyperlipidemia    Hypertension    Hypogonadism in male    Hypothyroid    Hypothyroidism    IDA (iron  deficiency anemia)    Lumbar disc disease    Nephrolithiasis    Panhypopituitarism (diabetes insipidus/anterior pituitary deficiency)    Paroxysmal A-fib (HCC)    Prostate cancer (HCC)    Sinus bradycardia    Sleep apnea    uses CPAP   Squamous cell carcinoma of skin 2014   Right hand. Mohs.   Stroke (HCC)    throat / slight difficulty swallowing    PAST SURGICAL HISTORY: Past Surgical History:  Procedure Laterality Date   BACK SURGERY  1997   L3, 4, 5 laminectomy    BACK SURGERY  1998   hard wire removed, fusion of L3-5   BACK SURGERY  2009   laminotomy/foraminotomy w/ decompression of nerve root, facet thermal ablation L2-3.    COLONOSCOPY     COLONOSCOPY WITH PROPOFOL  N/A 02/04/2021   Procedure: COLONOSCOPY WITH PROPOFOL ;  Surgeon: Maryruth Ole DASEN, MD;  Location: [facility] ENDOSCOPY;  Service: Endoscopy;  Laterality: N/A;  STAT CBC BEFORE PROCEDURE   ESOPHAGOGASTRODUODENOSCOPY (EGD) WITH PROPOFOL  N/A  02/04/2021   Procedure: ESOPHAGOGASTRODUODENOSCOPY (EGD) WITH PROPOFOL ;  Surgeon: Maryruth Ole DASEN, MD;  Location: [facility] ENDOSCOPY;  Service: Endoscopy;  Laterality: N/A;   HERNIA REPAIR     HOLEP-LASER ENUCLEATION OF THE PROSTATE WITH MORCELLATION N/A 06/18/2020   Procedure: HOLEP-LASER ENUCLEATION OF THE PROSTATE WITH MORCELLATION;  Surgeon: Francisca Redell BROCKS, MD;  Location: [facility] ORS;  Service: Urology;  Laterality: N/A;   JOINT REPLACEMENT      left knee partial   KIDNEY STONE SURGERY  2010   KNEE SURGERY Left 2007   Arthroscopic partial knee replacement, replaced medial side    MOHS SURGERY Right 2014   squamous cell carcinoma   NECK SURGERY  2008   fusion with hardwire in place C3 through C7    pituitary adenoma  1994   benign   PROSTATE BIOPSY N/A 01/01/2020   Procedure: PROSTATE BIOPSY URO NAV FUSION;  Surgeon: Kassie Ozell SAUNDERS, MD;  Location: [facility] ORS;  Service: Urology;  Laterality: N/A;   SPINE SURGERY     SPINE SURGERY  2016   fusion L4    TONSILLECTOMY      FAMILY HISTORY: Family History  Problem Relation Age of Onset   Stroke Mother    Heart attack Father    Leukemia Sister 79   Lung cancer Brother 20    ADVANCED DIRECTIVES (Y/N):  N  HEALTH MAINTENANCE: Social History   Tobacco Use   Smoking status: Former    Passive exposure: Past   Smokeless tobacco: Never   Tobacco comments:    quit 50 years ago  Vaping Use   Vaping status: Never Used  Substance Use Topics   Alcohol use: Yes    Comment: rarely   Drug use: Never     Colonoscopy:  PAP:  Bone density:  Lipid panel:  Allergies  Allergen Reactions   Metoprolol Other (See Comments)    dizziness    Other Other (See Comments)    Live oak trees SNEEZING, RUNNING NOSE   Rivaroxaban Other (See Comments)    hematuria     Current Outpatient Medications  Medication Sig Dispense Refill   amLODipine  (NORVASC ) 2.5 MG tablet Take 5 mg by mouth.     acetaminophen  (TYLENOL ) 500 MG tablet Take 500 mg by mouth every 6 (six) hours as needed.     apixaban  (ELIQUIS ) 5 MG TABS tablet Take 5 mg by mouth 2 (two) times daily.     B Complex-C (SUPER B COMPLEX PO) Take 1 capsule by mouth daily.      diltiazem  (CARDIZEM ) 30 MG tablet Take 30 mg by mouth daily as needed (afib episode).     hydrocortisone  (CORTEF ) 10 MG tablet Take 10 mg by mouth 2 (two) times daily.      Iron , Ferrous Sulfate , 325 (65 Fe) MG TABS Take 325 mg by  mouth every Monday, Wednesday, and Friday.     Lactulose  20 GM/30ML SOLN Take 30 mLs (20 g total) by mouth daily as needed. 450 mL 0   latanoprost (XALATAN) 0.005 % ophthalmic solution Place 1 drop into both eyes at bedtime.     Leuprolide  Acetate (ELIGARD  [place]) Inject into the skin. 1 injection every 6 months.     levothyroxine  (SYNTHROID ) 50 MCG tablet Take 50 mcg by mouth daily before breakfast.     melatonin 3 MG TABS tablet Take 3 mg by mouth at bedtime.     Multiple Vitamin (MULTIVITAMIN WITH MINERALS) TABS tablet Take 1 tablet by mouth daily. One-A-Day Men's  50+     pantoprazole  (PROTONIX ) 40 MG tablet Take 1 tablet (40 mg total) by mouth 2 (two) times daily before a meal. 60 tablet 0   polyethylene glycol (MIRALAX  / GLYCOLAX ) 17 g packet Take 17 g by mouth daily. W/coffee     Potassium Chloride  ER 20 MEQ TBCR Take 1 tablet (20 mEq total) by mouth daily for 4 days. 4 tablet 0   senna-docusate (SENOKOT-S) 8.6-50 MG tablet Take 2 tablets by mouth 3 times/day as needed-between meals & bedtime for mild constipation. 100 tablet 0   sucralfate  (CARAFATE ) 1 g tablet Take 1 tablet (1 g total) by mouth 4 (four) times daily -  with meals and at bedtime for 7 days. 28 tablet 0   traZODone  (DESYREL ) 50 MG tablet Take 50 mg by mouth at bedtime.     valsartan (DIOVAN) 160 MG tablet Take 160 mg by mouth 2 (two) times daily.     No current facility-administered medications for this visit.    OBJECTIVE: Vitals:   12/23/24 1432  BP: (!) 148/92  Pulse: 64  Resp: 18  Temp: 98.5 F (36.9 C)  SpO2: 96%      Body mass index is 34.29 kg/m.    ECOG FS:0 - Asymptomatic  General: Well-developed, well-nourished, no acute distress. Eyes: Pink conjunctiva, anicteric sclera. HEENT: Normocephalic, moist mucous membranes. Lungs: No audible wheezing or coughing. Heart: Regular rate and rhythm. Abdomen: Soft, nontender, no obvious distention. Musculoskeletal: No edema, cyanosis, or  clubbing. Neuro: Alert, answering all questions appropriately. Cranial nerves grossly intact. Skin: No rashes or petechiae noted. Psych: Normal affect.  LAB RESULTS:  Lab Results  Component Value Date   NA 141 10/10/2024   K 3.2 (L) 10/10/2024   CL 109 10/10/2024   CO2 21 (L) 10/10/2024   GLUCOSE 133 (H) 10/10/2024   BUN 21 10/10/2024   CREATININE 0.96 10/10/2024   CALCIUM 8.2 (L) 10/10/2024   PROT 7.6 10/08/2024   ALBUMIN 4.2 10/08/2024   AST 34 10/08/2024   ALT 22 10/08/2024   ALKPHOS 60 10/08/2024   BILITOT 0.6 10/08/2024   GFRNONAA >60 10/10/2024   GFRAA >60 09/01/2020    Lab Results  Component Value Date   WBC 8.9 10/10/2024   NEUTROABS 11.9 (H) 10/08/2024   HGB 11.8 (L) 10/10/2024   HCT 37.2 (L) 10/10/2024   MCV 81.8 10/10/2024   PLT 176 10/10/2024   Lab Results  Component Value Date   IRON  40 (L) 03/17/2024   TIBC 349 03/17/2024   IRONPCTSAT 12 (L) 03/17/2024   Lab Results  Component Value Date   FERRITIN 11 (L) 03/17/2024     STUDIES: No results found.  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  Patient's PSA was initially greater than 8.0, but trended down and has been undetectable since June 2022.  His most recent laboratory result at outside laboratory work continues to reveal an undetectable PSA.  Patient discontinued Zytiga  in approximately October 2024. If his PSA increases, can reinitiate treatment.  Proceed with Eligard  and Xgeva  today.  Return to clinic in 6 months for further evaluation and continuation of treatment.   Bony metastasis: Continue Xgeva  every 6 months as above. Urinary frequency/hematuria: Chronic and unchanged.  Continue evaluation and treatment per urology.  Anemia: Resolved.  Patient's hemoglobin is greater than 13.0.  He does not require  additional IV Venofer  today.  Patient last received treatment on Apr 10, 2024.  Return to clinic in 6 months as above.  Labs completed at outside physicians office.    Weakness and fatigue: Chronic and unchanged.   Atrial fibrillation: Continue follow-up with cardiology as indicated.  Patient is now on Eliquis .  Patient expressed understanding and was in agreement with this plan. He also understands that He can call clinic at any time with any questions, concerns, or complaints.    Cancer Staging  Prostate cancer [facility]) Staging form: Prostate, AJCC 8th Edition - Clinical stage from 02/17/2020: Stage IVB (cT2c, cN0, cM1b, PSA: 8.6, Grade Group: 5) - Signed by Jacobo Evalene PARAS, MD on 02/17/2020 Prostate specific antigen (PSA) range: Less than 10 Histologic grading system: 5 grade system   Evalene PARAS Jacobo, MD   12/23/2024 2:36 PM     "

## 2024-12-24 ENCOUNTER — Encounter: Payer: Self-pay | Admitting: Oncology

## 2024-12-31 ENCOUNTER — Telehealth: Payer: Self-pay | Admitting: Oncology

## 2024-12-31 NOTE — Telephone Encounter (Signed)
 Patients wife called and asked for the labs at Dr. Dianne office to be reviewed. She said that Dr. Cleotilde said he needs to bee seen due to the results. Please call [phone] for scheduling.

## 2025-02-02 ENCOUNTER — Inpatient Hospital Stay

## 2025-02-02 ENCOUNTER — Inpatient Hospital Stay: Admitting: Oncology

## 2025-05-06 ENCOUNTER — Ambulatory Visit: Admitting: Dermatology
